# Patient Record
Sex: Female | Born: 1945
Health system: Southern US, Community
[De-identification: ages and names within clinical notes are randomized; demographics above are authoritative.]

## PROBLEM LIST (undated history)

## (undated) DIAGNOSIS — M1990 Unspecified osteoarthritis, unspecified site: Secondary | ICD-10-CM

## (undated) DIAGNOSIS — Z9989 Dependence on other enabling machines and devices: Secondary | ICD-10-CM

## (undated) DIAGNOSIS — R51 Headache: Secondary | ICD-10-CM

## (undated) DIAGNOSIS — M542 Cervicalgia: Secondary | ICD-10-CM

## (undated) DIAGNOSIS — R569 Unspecified convulsions: Secondary | ICD-10-CM

## (undated) DIAGNOSIS — G500 Trigeminal neuralgia: Secondary | ICD-10-CM

## (undated) DIAGNOSIS — K219 Gastro-esophageal reflux disease without esophagitis: Secondary | ICD-10-CM

## (undated) DIAGNOSIS — Z87442 Personal history of urinary calculi: Secondary | ICD-10-CM

## (undated) DIAGNOSIS — I10 Essential (primary) hypertension: Secondary | ICD-10-CM

## (undated) DIAGNOSIS — E785 Hyperlipidemia, unspecified: Secondary | ICD-10-CM

## (undated) DIAGNOSIS — G4733 Obstructive sleep apnea (adult) (pediatric): Secondary | ICD-10-CM

## (undated) DIAGNOSIS — I739 Peripheral vascular disease, unspecified: Secondary | ICD-10-CM

## (undated) DIAGNOSIS — M549 Dorsalgia, unspecified: Secondary | ICD-10-CM

## (undated) DIAGNOSIS — IMO0001 Reserved for inherently not codable concepts without codable children: Secondary | ICD-10-CM

## (undated) DIAGNOSIS — J449 Chronic obstructive pulmonary disease, unspecified: Secondary | ICD-10-CM

## (undated) DIAGNOSIS — I2699 Other pulmonary embolism without acute cor pulmonale: Secondary | ICD-10-CM

## (undated) DIAGNOSIS — I5031 Acute diastolic (congestive) heart failure: Secondary | ICD-10-CM

## (undated) DIAGNOSIS — R011 Cardiac murmur, unspecified: Secondary | ICD-10-CM

## (undated) DIAGNOSIS — G43019 Migraine without aura, intractable, without status migrainosus: Secondary | ICD-10-CM

## (undated) DIAGNOSIS — G8929 Other chronic pain: Secondary | ICD-10-CM

## (undated) DIAGNOSIS — L89154 Pressure ulcer of sacral region, stage 4: Secondary | ICD-10-CM

## (undated) HISTORY — DX: Gastro-esophageal reflux disease without esophagitis: K21.9

## (undated) HISTORY — DX: Unspecified convulsions: R56.9

## (undated) HISTORY — DX: Chronic obstructive pulmonary disease, unspecified: J44.9

## (undated) HISTORY — DX: Hyperlipidemia, unspecified: E78.5

## (undated) HISTORY — DX: Peripheral vascular disease, unspecified: I73.9

## (undated) HISTORY — PX: MULTIPLE TOOTH EXTRACTIONS: SHX2053

## (undated) HISTORY — DX: Unspecified osteoarthritis, unspecified site: M19.90

## (undated) HISTORY — DX: Headache: R51

## (undated) HISTORY — DX: Cervicalgia: M54.2

## (undated) HISTORY — PX: NO PAST SURGERIES: SHX2092

## (undated) HISTORY — DX: Migraine without aura, intractable, without status migrainosus: G43.019

---
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
## 2010-08-21 ENCOUNTER — Inpatient Hospital Stay (HOSPITAL_COMMUNITY)
Admission: EM | Admit: 2010-08-21 | Discharge: 2010-08-23 | Payer: Self-pay | Source: Home / Self Care | Attending: Cardiology | Admitting: Cardiology

## 2010-08-22 ENCOUNTER — Encounter (INDEPENDENT_AMBULATORY_CARE_PROVIDER_SITE_OTHER): Payer: Self-pay | Admitting: Internal Medicine

## 2010-08-23 ENCOUNTER — Encounter: Payer: Self-pay | Admitting: Cardiology

## 2010-09-01 ENCOUNTER — Ambulatory Visit
Admission: RE | Admit: 2010-09-01 | Discharge: 2010-09-01 | Payer: Self-pay | Source: Home / Self Care | Attending: Internal Medicine | Admitting: Internal Medicine

## 2010-09-01 DIAGNOSIS — Q120 Congenital cataract: Secondary | ICD-10-CM | POA: Insufficient documentation

## 2010-09-01 DIAGNOSIS — K219 Gastro-esophageal reflux disease without esophagitis: Secondary | ICD-10-CM | POA: Insufficient documentation

## 2010-09-01 DIAGNOSIS — E785 Hyperlipidemia, unspecified: Secondary | ICD-10-CM | POA: Insufficient documentation

## 2010-09-01 DIAGNOSIS — I2510 Atherosclerotic heart disease of native coronary artery without angina pectoris: Secondary | ICD-10-CM | POA: Insufficient documentation

## 2010-09-01 DIAGNOSIS — M1990 Unspecified osteoarthritis, unspecified site: Secondary | ICD-10-CM | POA: Insufficient documentation

## 2010-09-01 DIAGNOSIS — F17200 Nicotine dependence, unspecified, uncomplicated: Secondary | ICD-10-CM | POA: Insufficient documentation

## 2010-09-01 DIAGNOSIS — J441 Chronic obstructive pulmonary disease with (acute) exacerbation: Secondary | ICD-10-CM | POA: Insufficient documentation

## 2010-09-01 DIAGNOSIS — R51 Headache: Secondary | ICD-10-CM | POA: Insufficient documentation

## 2010-09-01 DIAGNOSIS — I739 Peripheral vascular disease, unspecified: Secondary | ICD-10-CM | POA: Insufficient documentation

## 2010-09-01 DIAGNOSIS — R519 Headache, unspecified: Secondary | ICD-10-CM | POA: Insufficient documentation

## 2010-09-03 ENCOUNTER — Encounter: Payer: Self-pay | Admitting: Internal Medicine

## 2010-09-04 ENCOUNTER — Encounter: Payer: Self-pay | Admitting: Internal Medicine

## 2010-09-11 ENCOUNTER — Ambulatory Visit (HOSPITAL_COMMUNITY)
Admission: RE | Admit: 2010-09-11 | Discharge: 2010-09-11 | Payer: Self-pay | Source: Home / Self Care | Attending: Internal Medicine | Admitting: Internal Medicine

## 2010-09-18 ENCOUNTER — Encounter: Payer: Self-pay | Admitting: Internal Medicine

## 2010-09-19 ENCOUNTER — Encounter (INDEPENDENT_AMBULATORY_CARE_PROVIDER_SITE_OTHER): Payer: Self-pay | Admitting: *Deleted

## 2010-09-21 ENCOUNTER — Encounter
Admission: RE | Admit: 2010-09-21 | Discharge: 2010-09-21 | Payer: Self-pay | Source: Home / Self Care | Attending: Internal Medicine | Admitting: Internal Medicine

## 2010-09-28 ENCOUNTER — Other Ambulatory Visit: Payer: Self-pay | Admitting: Internal Medicine

## 2010-09-28 DIAGNOSIS — R921 Mammographic calcification found on diagnostic imaging of breast: Secondary | ICD-10-CM

## 2010-09-28 DIAGNOSIS — R928 Other abnormal and inconclusive findings on diagnostic imaging of breast: Secondary | ICD-10-CM

## 2010-09-28 NOTE — Miscellaneous (Signed)
Summary: meds  Clinical Lists Changes  Medications: Added new medication of * ALBUTEROL INHALER - Signed Added new medication of * FLOVENT Twice daily - Signed Added new medication of * IBUPROFRN 600MG  three times a day - Signed Added new medication of * PRILOSEC 40MG  Take 1 tablet by mouth once a day - Signed Added new medication of * CRESTOR 20MG  Take 1 tablet by mouth once a day - Signed Added new medication of * SPIRIVA once daily - Signed

## 2010-09-28 NOTE — Medication Information (Signed)
Summary: [facility]  [facility]   Imported By: Sherian Rein 09/15/2010 14:59:54  _____________________________________________________________________  External Attachment:    Type:   Image     Comment:   External Document

## 2010-09-28 NOTE — Letter (Signed)
Summary: Pre Visit Letter Revised  [facility]  [address] [place], [place] [address]   Phone: [phone]  Fax: [phone]        09/19/2010 MRN: [id number] Janice Morrow [address] [place], [place]  [address]             Procedure Date:  10-20-10   Welcome to the Gastroenterology Division at [facility].    You are scheduled to see a nurse for your pre-procedure visit on 10-06-10 at 4:30P.M. on the 3rd floor at [facility], [address]  We ask that you try to arrive at our office 15 minutes prior to your appointment time to allow for check-in.  Please take a minute to review the attached form.  If you answer "Yes" to one or more of the questions on the first page, we ask that you call the person listed at your earliest opportunity.  If you answer "No" to all of the questions, please complete the rest of the form and bring it to your appointment.    Your nurse visit will consist of discussing your medical and surgical history, your immediate family medical history, and your medications.   If you are unable to list all of your medications on the form, please bring the medication bottles to your appointment and we will list them.  We will need to be aware of both prescribed and over the counter drugs.  We will need to know exact dosage information as well.    Please be prepared to read and sign documents such as consent forms, a financial agreement, and acknowledgement forms.  If necessary, and with your consent, a friend or relative is welcome to sit-in on the nurse visit with you.  Please bring your insurance card so that we may make a copy of it.  If your insurance requires a referral to see a specialist, please bring your referral form from your primary care physician.  No co-pay is required for this nurse visit.     If you cannot keep your appointment, please call [phone] to cancel or reschedule prior to your appointment date.  This allows [place] the  opportunity to schedule an appointment for another patient in need of care.    Thank you for choosing [facility] for your medical needs.  We appreciate the opportunity to care for you.  Please visit [place] at our website  to learn more about our practice.  Sincerely, The Gastroenterology Division

## 2010-09-28 NOTE — Assessment & Plan Note (Signed)
Summary: New / Medicare / #/ cd   Vital Signs:  Patient profile:   65 year old female Menstrual status:  postmenopausal Height:      65 inches Weight:      301 pounds BMI:     50.27 O2 Sat:      93 % on Room air Temp:     98.3 degrees F oral Pulse rate:   78 / minute Pulse rhythm:   regular Resp:     16 per minute BP sitting:   120 / 70  (left arm) Cuff size:   large  Vitals Entered By: Rock Nephew CMA (September 01, 2010 1:53 PM)  Nutrition Counseling: Patient's BMI is greater than 25 and therefore counseled on weight management options.  O2 Flow:  Room air CC: new to establish, Headaches, Preventive Care Is Patient Diabetic? No  Does patient need assistance? Functional Status Self care Ambulation Normal     Menstrual Status postmenopausal   Primary Care Provider:  Etta Grandchild MD  CC:  new to establish, Headaches, and Preventive Care.  History of Present Illness:  Headaches      This is a 65 year old woman who presents with Headaches.  The symptoms began 3 years ago.  On a scale of 1 to 10, the intensity is described as a 2.  The patient reports nausea and vomiting, but denies sweats, tearing of eyes, nasal congestion, sinus pain, sinus pressure, photophobia, and phonophobia.  The headache is described as stabbing and sharp.  The location of the pain is unilateral on the right.  High-risk features (red flags) include pain worse with exertion and age >50 years.  The patient denies the following high-risk features: fever, neck pain/stiffness, vision loss or change, focal weakness, altered mental status, rash, trauma, new type of headache, immunosuppression, concomitant infection, and anticoagulation use.  The headaches are precipitated by change in weather.  Prior treatment has included a narcotic- she tells that a doctor in [place] gave her percocet at one time. She tells me that she has never had a brain imaging done and she is concerned that she may have a tumor or  cyst.  She is s/p an admission for COPD exacerbation and ACS ( Dr. Jens Som .)  Preventive Screening-Counseling & Management  Alcohol-Tobacco     Alcohol drinks/day: 0     Alcohol Counseling: not indicated; patient does not drink     Smoking Status: quit < 6 months     Smoking Cessation Counseling: yes     Smoke Cessation Stage: quit     Year Quit: 2012     Tobacco Counseling: to remain off tobacco products  Caffeine-Diet-Exercise     Does Patient Exercise: no  Hep-HIV-STD-Contraception     Hepatitis Risk: no risk noted     HIV Risk: no risk noted     STD Risk: no risk noted     Dental Visit-last 6 months yes     SBE monthly: yes     SBE Education/Counseling: to perform regular SBE  Safety-Violence-Falls     Seat Belt Use: yes     Helmet Use: n/a     Firearms in the Home: no firearms in the home     Smoke Detectors: no     Violence in the Home: no risk noted     Sexual Abuse: no      Drug Use:  no.        Blood Transfusions:  no.  Medications Prior to Update: 1)  None  Current Medications (verified): 1)  Tramadol Hcl 50 Mg Tabs (Tramadol Hcl) .... One By Mouth Qid As Needed For Pain  Allergies (verified): No Known Drug Allergies  Past History:  Past Medical History: COPD Coronary artery disease GERD Hyperlipidemia Osteoarthritis Peripheral vascular disease  Past Surgical History: Denies surgical history  Family History: Family History of CAD Female 1st degree relative <50  Social History: Retired/disabled Alcohol use-no Drug use-no Regular exercise-no Smoking Status:  quit < 6 months Hepatitis Risk:  no risk noted HIV Risk:  no risk noted STD Risk:  no risk noted Blood Transfusions:  no Drug Use:  no Does Patient Exercise:  no Seat Belt Use:  yes Dental Care w/in 6 mos.:  yes  Review of Systems  The patient denies anorexia, fever, weight loss, weight gain, chest pain, syncope, dyspnea on exertion, peripheral edema, prolonged cough,  headaches, hemoptysis, abdominal pain, hematuria, depression, abnormal bleeding, enlarged lymph nodes, angioedema, and breast masses.   Eyes:  Denies blurring, discharge, double vision, eye irritation, eye pain, halos, itching, light sensitivity, red eye, vision loss-1 eye, and vision loss-both eyes. CV:  Denies chest pain or discomfort, difficulty breathing at night, fainting, fatigue, leg cramps with exertion, lightheadness, near fainting, palpitations, shortness of breath with exertion, and swelling of feet. Resp:  Denies chest pain with inspiration, cough, coughing up blood, excessive snoring, pleuritic, shortness of breath, sputum productive, and wheezing.  Physical Exam  General:  alert, well-developed, well-nourished, well-hydrated, appropriate dress, normal appearance, healthy-appearing, cooperative to examination, good hygiene, and overweight-appearing.   Head:  normocephalic, atraumatic, no abnormalities observed, and no abnormalities palpated.  she has coarse facial hair over the chin. Eyes:  vision grossly intact, pupils equal, pupils round, pupils reactive to light, pupils react to accomodation, no injection, no optic disk abnormalities, and left cataract.   Ears:  R ear normal and L ear normal.   Nose:  External nasal examination shows no deformity or inflammation. Nasal mucosa are pink and moist without lesions or exudates. Mouth:  Oral mucosa and oropharynx without lesions or exudates.  Teeth in good repair. Neck:  supple, full ROM, no masses, no thyromegaly, no thyroid nodules or tenderness, no JVD, normal carotid upstroke, no carotid bruits, no cervical lymphadenopathy, and no neck tenderness.   Lungs:  normal respiratory effort, no intercostal retractions, no accessory muscle use, normal breath sounds, no dullness, no fremitus, no crackles, and no wheezes.   Heart:  normal rate, regular rhythm, no murmur, no gallop, no rub, and no JVD.   Abdomen:  soft, non-tender, normal bowel  sounds, no distention, no masses, no guarding, no rigidity, no rebound tenderness, no abdominal hernia, no inguinal hernia, no hepatomegaly, and no splenomegaly.   Msk:  severe DJD in both knees Pulses:  R radial normal, R femoral normal, L radial normal, L femoral normal, R popliteal decreased, R posterior tibial decreased, R dorsalis pedis decreased, L popliteal decreased, L posterior tibial decreased, and L dorsalis pedis decreased.   Extremities:  No clubbing, cyanosis, edema, or deformity noted with normal full range of motion of all joints.   Neurologic:  No cranial nerve deficits noted. Station and gait are normal. Plantar reflexes are down-going bilaterally. DTRs are symmetrical throughout. Sensory, motor and coordinative functions appear intact. Skin:  turgor normal, color normal, no rashes, no suspicious lesions, no ecchymoses, no petechiae, no purpura, no ulcerations, and no edema.   Cervical Nodes:  no anterior cervical adenopathy and no  posterior cervical adenopathy.   Axillary Nodes:  no R axillary adenopathy and no L axillary adenopathy.   Inguinal Nodes:  no R inguinal adenopathy and no L inguinal adenopathy.   Psych:  Cognition and judgment appear intact. Alert and cooperative with normal attention span and concentration. No apparent delusions, illusions, hallucinations   Impression & Recommendations:  Problem # 1:  TOBACCO USE (ICD-305.1) Assessment Improved  Encouraged smoking cessation and discussed different methods for smoking cessation.   Problem # 2:  HEADACHE (ICD-784.0) Assessment: New will screen her for mass, avm, etc. and treat the pain with tramadol Her updated medication list for this problem includes:    Tramadol Hcl 50 Mg Tabs (Tramadol hcl) ..... One by mouth qid as needed for pain  Orders: Radiology Referral (Radiology)  Headache diary reviewed.  Problem # 3:  CATARACT, CONGENITAL, LEFT (ICD-743.30) Assessment: New  Orders: Ophthalmology Referral  (Ophthalmology)  Problem # 4:  PERIPHERAL VASCULAR DISEASE (ICD-443.9) Assessment: Unchanged  Complete Medication List: 1)  Tramadol Hcl 50 Mg Tabs (Tramadol hcl) .... One by mouth qid as needed for pain  Other Orders: Gastroenterology Referral (GI)  Colorectal Screening:  Current Recommendations:    Colonoscopy recommended: scheduled with G.I.  PAP Screening:    Reviewed PAP smear recommendations:  patient refuses understanding risks of delayed diagnosis  Mammogram Screening:    Reviewed Mammogram recommendations:  mammogram ordered  Osteoporosis Risk Assessment:  Risk Factors for Fracture or Low Bone Density:   Smoking status:       quit < 6 months  Immunization & Chemoprophylaxis:    Pneumovax: Pneumovax  (08/16/2010)  Patient Instructions: 1)  Please schedule a follow-up appointment in 1 month. 2)  Tobacco is very bad for your health and your loved ones! You Should stop smoking!. 3)  Stop Smoking Tips: Choose a Quit date. Cut down before the Quit date. decide what you will do as a substitute when you feel the urge to smoke(gum,toothpick,exercise). 4)  It is important that you exercise regularly at least 20 minutes 5 times a week. If you develop chest pain, have severe difficulty breathing, or feel very tired , stop exercising immediately and seek medical attention. 5)  You need to lose weight. Consider a lower calorie diet and regular exercise.  6)  Schedule your mammogram. 7)  Schedule a colonoscopy/sigmoidoscopy to help detect colon cancer. 8)  You need to have a Pap Smear to prevent cervical cancer. Prescriptions: TRAMADOL HCL 50 MG TABS (TRAMADOL HCL) One by mouth QID as needed for pain  #75 x 3   Entered and Authorized by:   Etta Grandchild MD   Signed by:   Etta Grandchild MD on 09/01/2010   Method used:   Print then Give to Patient   RxID:   [id number]    Orders Added: 1)  Ophthalmology Referral [Ophthalmology] 2)  Radiology Referral [Radiology] 3)   Radiology Referral [Radiology] 4)  New Patient Level IV [14782] 5)  Gastroenterology Referral [GI]   Immunization History:  Pneumovax Immunization History:    Pneumovax:  pneumovax (08/16/2010)   Immunization History:  Pneumovax Immunization History:    Pneumovax:  Pneumovax (08/16/2010)    Prevention & Chronic Care Immunizations   Influenza vaccine: Not documented   Influenza vaccine deferral: Refused  (09/01/2010)    Tetanus booster: Not documented    Pneumococcal vaccine: Pneumovax  (08/16/2010)    H. zoster vaccine: Not documented   H. zoster vaccine deferral: Refused  (09/01/2010)  Colorectal Screening  Hemoccult: Not documented    Colonoscopy: Not documented   Colonoscopy action/deferral: scheduled with G.I.  (09/01/2010)  Other Screening   Pap smear: Not documented   Pap smear action/deferral: patient refuses understanding risks of delayed diagnosis  (09/01/2010)    Mammogram: Not documented   Mammogram action/deferral: mammogram ordered  (09/01/2010)    DXA bone density scan: Not documented   Smoking status: quit < 6 months  (09/01/2010)  Lipids   Total Cholesterol: Not documented   LDL: Not documented   LDL Direct: Not documented   HDL: Not documented   Triglycerides: Not documented    SGOT (AST): Not documented   SGPT (ALT): Not documented   Alkaline phosphatase: Not documented   Total bilirubin: Not documented  Self-Management Support :    Lipid self-management support: Not documented    Nursing Instructions: Give tetanus booster today

## 2010-09-29 ENCOUNTER — Encounter: Payer: Self-pay | Admitting: Internal Medicine

## 2010-10-02 ENCOUNTER — Ambulatory Visit: Payer: Self-pay | Admitting: Internal Medicine

## 2010-10-02 ENCOUNTER — Ambulatory Visit (INDEPENDENT_AMBULATORY_CARE_PROVIDER_SITE_OTHER): Payer: Medicare Other | Admitting: Internal Medicine

## 2010-10-02 ENCOUNTER — Encounter: Payer: Self-pay | Admitting: Internal Medicine

## 2010-10-02 DIAGNOSIS — J449 Chronic obstructive pulmonary disease, unspecified: Secondary | ICD-10-CM

## 2010-10-02 DIAGNOSIS — R51 Headache: Secondary | ICD-10-CM

## 2010-10-04 ENCOUNTER — Ambulatory Visit
Admission: RE | Admit: 2010-10-04 | Discharge: 2010-10-04 | Disposition: A | Discharge status: Home / Self Care | Payer: Medicare Other | Source: Ambulatory Visit | Attending: Internal Medicine | Admitting: Internal Medicine

## 2010-10-04 DIAGNOSIS — R921 Mammographic calcification found on diagnostic imaging of breast: Secondary | ICD-10-CM

## 2010-10-04 DIAGNOSIS — R928 Other abnormal and inconclusive findings on diagnostic imaging of breast: Secondary | ICD-10-CM

## 2010-10-05 ENCOUNTER — Encounter (INDEPENDENT_AMBULATORY_CARE_PROVIDER_SITE_OTHER): Payer: Self-pay | Admitting: *Deleted

## 2010-10-06 ENCOUNTER — Encounter: Payer: Self-pay | Admitting: Gastroenterology

## 2010-10-12 NOTE — Letter (Signed)
Summary: [facility] Instructions  [facility]  [address] [place], [place] [address]   Phone: [phone]  Fax: [phone]       Janice Morrow    14-Feb-1946    MRN: [id number]        Procedure Day /Date: Friday 10/20/10     Arrival Time: 10:30 am      Procedure Time: 11:30 am     Location of Procedure:                    _X_  [facility] (4th Floor)  PREPARATION FOR COLONOSCOPY WITH MOVIPREP   Starting 5 days prior to your procedure 10/15/10  do not eat nuts, seeds, popcorn, corn, beans, peas,  salads, or any raw vegetables.  Do not take any fiber supplements (e.g. Metamucil, Citrucel, and Benefiber).  THE DAY BEFORE YOUR PROCEDURE         DATE: 10/19/10    DAY: Thursday    1.  Drink clear liquids the entire day-NO SOLID FOOD  2.  Do not drink anything colored red or purple.  Avoid juices with pulp.  No orange juice.  3.  Drink at least 64 oz. (8 glasses) of fluid/clear liquids during the day to prevent dehydration and help the prep work efficiently.  CLEAR LIQUIDS INCLUDE: Water Jello Ice Popsicles Tea (sugar ok, no milk/cream) Powdered fruit flavored drinks Coffee (sugar ok, no milk/cream) Gatorade Juice: apple, white grape, white cranberry  Lemonade Clear bullion, consomm, broth Carbonated beverages (any kind) Strained chicken noodle soup Hard Candy                             4.  In the morning, mix first dose of MoviPrep solution:    Empty 1 Pouch A and 1 Pouch B into the disposable container    Add lukewarm drinking water to the top line of the container. Mix to dissolve    Refrigerate (mixed solution should be used within 24 hrs)  5.  Begin drinking the prep at 5:00 p.m. The MoviPrep container is divided by 4 marks.   Every 15 minutes drink the solution down to the next mark (approximately 8 oz) until the full liter is complete.   6.  Follow completed prep with 16 oz of clear liquid of your choice (Nothing red or purple).   Continue to drink clear liquids until bedtime.  7.  Before going to bed, mix second dose of MoviPrep solution:    Empty 1 Pouch A and 1 Pouch B into the disposable container    Add lukewarm drinking water to the top line of the container. Mix to dissolve    Refrigerate  THE DAY OF YOUR PROCEDURE      DATE: 10/20/10  DAY: Friday  Beginning at 6:30 a.m. (5 hours before procedure):         1. Every 15 minutes, drink the solution down to the next mark (approx 8 oz) until the full liter is complete.  2. Follow completed prep with 16 oz. of clear liquid of your choice.    3. You may drink clear liquids until 9:30 am  (2 HOURS BEFORE PROCEDURE).   MEDICATION INSTRUCTIONS  Unless otherwise instructed, you should take regular prescription medications with a small sip of water   as early as possible the morning of your procedure.            OTHER INSTRUCTIONS  You will need a responsible adult at least 63 years of age to accompany you and drive you home.   This person must remain in the waiting room during your procedure.  Wear loose fitting clothing that is easily removed.  Leave jewelry and other valuables at home.  However, you may wish to bring a book to read or  an iPod/MP3 player to listen to music as you wait for your procedure to start.  Remove all body piercing jewelry and leave at home.  Total time from sign-in until discharge is approximately 2-3 hours.  You should go home directly after your procedure and rest.  You can resume normal activities the  day after your procedure.  The day of your procedure you should not:   Drive   Make legal decisions   Operate machinery   Drink alcohol   Return to work  You will receive specific instructions about eating, activities and medications before you leave.    The above instructions have been reviewed and explained to me by   Sherren Kerns RN  October 06, 2010 4:44 PM    I fully understand and can verbalize  these instructions _____________________________ Date _________

## 2010-10-12 NOTE — Letter (Signed)
Summary: [facility] Consult Scheduled Letter  [facility]  [address] [place], [place] [address]   Phone: [phone]  Fax: [phone]      10/05/2010 MRN: [id number]  SARAHBETH CASHIN [address] [place], [place]  [address]  [place]    Dear Ms. Santoro,      We have scheduled an appointment for you.  At the recommendation of Dr.Jones, we have scheduled you a consult with Dr Marchelle Gearing on 10/18/10 at 3:50pm.  Their phone number is [phone].  If this appointment day and time is not convenient for you, please feel free to call the office of the doctor you are being referred to at the number listed above and reschedule the appointment.     [facility] [address] [place], [place] [address] *Pulmonary Dept.2nd Floor*   Please give 24hr notice if you need to cancel/rescheule to avoid a $50.00 fee.Also bring insurance card and any co-pay due at time of visit.    Thank you,  Patient Care Coordinator  [facility]

## 2010-10-12 NOTE — Assessment & Plan Note (Signed)
Summary: 1 MTH FU-LB   Vital Signs:  Patient profile:   65 year old female Menstrual status:  postmenopausal Height:      65 inches Weight:      306 pounds BMI:     51.11 O2 Sat:      94 % on Room air Temp:     98.6 degrees F oral Pulse rate:   74 / minute Pulse rhythm:   regular Resp:     16 per minute BP sitting:   130 / 82  (left arm) Cuff size:   large  Vitals Entered By: Rock Nephew CMA (October 02, 2010 2:16 PM)  O2 Flow:  Room air CC: follow-up visit Is Patient Diabetic? No  Does patient need assistance? Functional Status Self care Ambulation Normal   Primary Care Provider:  Etta Grandchild MD  CC:  follow-up visit.  History of Present Illness: She returns for f/up and complains of persistent headaches and she wants to see a specialist to find out what causes her pain. She tells me that tramadol kills the pain but it makes her sleepy so she has stopped taking it.  Also, she has stopped using all of her inhalers b/c she has decided that she will not take steroids anymore.  Preventive Screening-Counseling & Management  Alcohol-Tobacco     Alcohol drinks/day: 0     Alcohol Counseling: not indicated; patient does not drink     Smoking Status: quit < 6 months     Smoking Cessation Counseling: yes     Smoke Cessation Stage: quit     Year Quit: 2012     Tobacco Counseling: to remain off tobacco products  Hep-HIV-STD-Contraception     Hepatitis Risk: no risk noted     HIV Risk: no risk noted     STD Risk: no risk noted     Dental Visit-last 6 months yes     SBE monthly: yes     SBE Education/Counseling: to perform regular SBE      Drug Use:  no.        Blood Transfusions:  no.    Clinical Review Panels:  Prevention   Last Mammogram:  mammograms for comparison - BI-RADS 0 -^MM DIGITAL SCREENING (09/21/2010)  Immunizations   Last Pneumovax:  Pneumovax (08/16/2010)  Diabetes Management   Last Pneumovax:  Pneumovax (08/16/2010)   Medications  Prior to Update: 1)  Tramadol Hcl 50 Mg Tabs (Tramadol Hcl) .... One By Mouth Qid As Needed For Pain 2)  Albuterol Inhaler 3)  Flovent .... Twice Daily 4)  Ibuprofrn 600mg  .... Three Times A Day 5)  Prilosec 40mg  .... Take 1 Tablet By Mouth Once A Day 6)  Crestor 20mg  .... Take 1 Tablet By Mouth Once A Day 7)  Spiriva .... Once Daily  Current Medications (verified): 1)  Ibuprofrn 600mg  .... Three Times A Day 2)  Prilosec 40mg  .... Take 1 Tablet By Mouth Once A Day 3)  Crestor 20mg  .... Take 1 Tablet By Mouth Once A Day  Allergies (verified): No Known Drug Allergies  Past History:  Past Medical History: Last updated: 09/01/2010 COPD Coronary artery disease GERD Hyperlipidemia Osteoarthritis Peripheral vascular disease  Past Surgical History: Last updated: 09/01/2010 Denies surgical history  Family History: Last updated: 09/01/2010 Family History of CAD Female 1st degree relative <50  Social History: Last updated: 09/01/2010 Retired/disabled Alcohol use-no Drug use-no Regular exercise-no  Risk Factors: Alcohol Use: 0 (10/02/2010) Exercise: no (09/01/2010)  Risk Factors:  Smoking Status: quit < 6 months (10/02/2010)  Family History: Reviewed history from 09/01/2010 and no changes required. Family History of CAD Female 1st degree relative <50  Social History: Reviewed history from 09/01/2010 and no changes required. Retired/disabled Alcohol use-no Drug use-no Regular exercise-no  Review of Systems       The patient complains of weight gain and headaches.  The patient denies anorexia, fever, weight loss, chest pain, syncope, dyspnea on exertion, peripheral edema, prolonged cough, hemoptysis, abdominal pain, melena, hematuria, suspicious skin lesions, difficulty walking, depression, unusual weight change, abnormal bleeding, and enlarged lymph nodes.   Resp:  Complains of cough and wheezing; denies chest discomfort, chest pain with inspiration, coughing up  blood, excessive snoring, hypersomnolence, morning headaches, pleuritic, shortness of breath, and sputum productive.  Physical Exam  General:  alert, well-developed, well-nourished, well-hydrated, appropriate dress, normal appearance, healthy-appearing, cooperative to examination, good hygiene, and overweight-appearing.   Head:  normocephalic, atraumatic, no abnormalities observed, and no abnormalities palpated.  she has coarse facial hair over the chin. Mouth:  Oral mucosa and oropharynx without lesions or exudates.  Teeth in good repair. Neck:  supple, full ROM, no masses, no thyromegaly, no thyroid nodules or tenderness, no JVD, normal carotid upstroke, no carotid bruits, no cervical lymphadenopathy, and no neck tenderness.   Lungs:  normal respiratory effort, no intercostal retractions, no accessory muscle use, normal breath sounds, no dullness, no fremitus, no crackles, and no wheezes.   Heart:  normal rate, regular rhythm, no murmur, no gallop, no rub, and no JVD.   Abdomen:  soft, non-tender, normal bowel sounds, no distention, no masses, no guarding, no rigidity, no rebound tenderness, no abdominal hernia, no inguinal hernia, no hepatomegaly, and no splenomegaly.   Msk:  severe DJD in both knees Pulses:  R radial normal, R femoral normal, L radial normal, L femoral normal, R popliteal decreased, R posterior tibial decreased, R dorsalis pedis decreased, L popliteal decreased, L posterior tibial decreased, and L dorsalis pedis decreased.   Extremities:  No clubbing, cyanosis, edema, or deformity noted with normal full range of motion of all joints.   Neurologic:  No cranial nerve deficits noted. Station and gait are normal. Plantar reflexes are down-going bilaterally. DTRs are symmetrical throughout. Sensory, motor and coordinative functions appear intact. Skin:  turgor normal, color normal, no rashes, no suspicious lesions, no ecchymoses, no petechiae, no purpura, no ulcerations, and no edema.    Psych:  Cognition and judgment appear intact. Alert and cooperative with normal attention span and concentration. No apparent delusions, illusions, hallucinations   Impression & Recommendations:  Problem # 1:  COPD (ICD-496) Assessment Unchanged  Orders: Pulmonary Referral (Pulmonary)  Pulmonary Functions Reviewed: O2 sat: 94 (10/02/2010)     Vaccines Reviewed: Pneumovax: Pneumovax (08/16/2010)     Problem # 2:  HEADACHE (ICD-784.0) Assessment: Unchanged  The following medications were removed from the medication list:    Tramadol Hcl 50 Mg Tabs (Tramadol hcl) ..... One by mouth qid as needed for pain  Orders: Neurology Referral (Neuro)  Headache diary reviewed.  Complete Medication List: 1)  Ibuprofrn 600mg   .... Three times a day 2)  Prilosec 40mg   .... Take 1 tablet by mouth once a day 3)  Crestor 20mg   .... Take 1 tablet by mouth once a day  Patient Instructions: 1)  Please schedule a follow-up appointment in 3 months. 2)  It is important that you exercise regularly at least 20 minutes 5 times a week. If you develop chest pain,  have severe difficulty breathing, or feel very tired , stop exercising immediately and seek medical attention. 3)  You need to lose weight. Consider a lower calorie diet and regular exercise.    Orders Added: 1)  Neurology Referral [Neuro] 2)  Pulmonary Referral [Pulmonary] 3)  Est. Patient Level III [36644]

## 2010-10-12 NOTE — Miscellaneous (Signed)
Summary: previsit prep/rm  Clinical Lists Changes Patient unable to pay for moviprep, so spoke with pharmacy about halflytlely prep. they said this would cost $3.30. PATIENT agreed/able to pay for this. Medications: Added new medication of HALFLYTELY WITH FLAVOR PACKS 5-210 MG-GM KIT (BISACODYL-PEG-KCL-NABICAR-NACL)

## 2010-10-12 NOTE — Consult Note (Signed)
Summary: [facility]   Imported By: Sherian Rein 10/03/2010 08:18:11  _____________________________________________________________________  External Attachment:    Type:   Image     Comment:   External Document

## 2010-10-16 ENCOUNTER — Encounter (INDEPENDENT_AMBULATORY_CARE_PROVIDER_SITE_OTHER): Payer: Self-pay | Admitting: *Deleted

## 2010-10-18 ENCOUNTER — Institutional Professional Consult (permissible substitution) (INDEPENDENT_AMBULATORY_CARE_PROVIDER_SITE_OTHER): Payer: Medicare Other | Admitting: Internal Medicine

## 2010-10-18 ENCOUNTER — Encounter: Payer: Self-pay | Admitting: Internal Medicine

## 2010-10-18 DIAGNOSIS — J449 Chronic obstructive pulmonary disease, unspecified: Secondary | ICD-10-CM

## 2010-10-20 ENCOUNTER — Other Ambulatory Visit: Payer: Self-pay | Admitting: Gastroenterology

## 2010-10-20 ENCOUNTER — Other Ambulatory Visit (AMBULATORY_SURGERY_CENTER): Payer: Medicare Other | Admitting: Gastroenterology

## 2010-10-20 DIAGNOSIS — D126 Benign neoplasm of colon, unspecified: Secondary | ICD-10-CM

## 2010-10-20 DIAGNOSIS — Z1211 Encounter for screening for malignant neoplasm of colon: Secondary | ICD-10-CM

## 2010-10-20 DIAGNOSIS — K5730 Diverticulosis of large intestine without perforation or abscess without bleeding: Secondary | ICD-10-CM

## 2010-10-20 LAB — HM COLONOSCOPY

## 2010-10-24 ENCOUNTER — Encounter: Payer: Self-pay | Admitting: Gastroenterology

## 2010-10-24 NOTE — Assessment & Plan Note (Signed)
Summary: COPD//SH   Visit Type:  Follow-up Primary Provider/Referring Provider:  Etta Grandchild MD  CC:  Pulmonary COnsult for SOB.  Pt states she wants oxygen picked up from home and she refused walk test. .  History of Present Illness: October 18, 2010: 65 year old obese female and ex heavy smoker (quit jan 2012, 40 pack smoker). States that in dec 2011 and atypical chest pain. PE ruled out. CT 08/23/2010 showed emphysema. MI ruled out. EKG suggested acute pericarditis but echo did not show perdicardial effusion. She ws discharged with o2 and spiriva and pulmicort per hx. She states that since discharge she has not used those medications. She feels she does not need mdi's. She does not have dyspnea but then she is sedentary at home other than groceries once a month and some cleaning of home.  She does not get dyspneic anymore for tehse activitieis. She used to have chronic cough before but since quitting smoking denies cough. Currently denies wheeze, chest pain, edema, snoring, or excess day time somnolence. She categorically feels o2 and mdi's are not helping her although she did desaturates to 87% after walking 155 feet. She is currently on no medications. I showed her her spirometry results showing FVC and fev1 in 50s with small airway but she does not want Rx. I offered copd research trial but not interested. She believer her weight is more of the issue for dyspnea.    Preventive Screening-Counseling & Management  Alcohol-Tobacco     Alcohol drinks/day: 0     Alcohol Counseling: not indicated; patient does not drink     Smoking Status: quit < 6 months     Smoking Cessation Counseling: yes     Smoke Cessation Stage: quit     Year Quit: 2012     Tobacco Counseling: to remain off tobacco products  Caffeine-Diet-Exercise     Does Patient Exercise: no  Current Medications (verified): 1)  None  Allergies (verified): No Known Drug Allergies  Past History:  Past medical, surgical,  family and social histories (including risk factors) reviewed, and no changes noted (except as noted below).  Past Medical History: Reviewed history from 09/01/2010 and no changes required. COPD Coronary artery disease GERD Hyperlipidemia Osteoarthritis Peripheral vascular disease  Past Surgical History: Reviewed history from 09/01/2010 and no changes required. Denies surgical history  Family History: Reviewed history from 09/01/2010 and no changes required. Family History of CAD Female 1st degree relative <50  Social History: Reviewed history from 09/01/2010 and no changes required. Lives with Sister  Retired/disabled Alcohol use-no Drug use-no Regular exercise-no  Quit in 09-22-10, started 1969 x 0.5ppd  Review of Systems       The patient complains of shortness of breath with activity, productive cough, chest pain, and acid heartburn.  The patient denies shortness of breath at rest, non-productive cough, coughing up blood, irregular heartbeats, indigestion, loss of appetite, weight change, abdominal pain, difficulty swallowing, sore throat, tooth/dental problems, headaches, nasal congestion/difficulty breathing through nose, sneezing, itching, ear ache, anxiety, depression, hand/feet swelling, joint stiffness or pain, rash, change in color of mucus, and fever.    Vital Signs:  Patient profile:   65 year old female Menstrual status:  postmenopausal Height:      65 inches Weight:      306.13 pounds BMI:     51.13 O2 Sat:      92 % on Room air Temp:     98.6 degrees F oral Pulse rate:   107 /  minute BP sitting:   124 / 76  (right arm) Cuff size:   large  Vitals Entered By: Carron Curie CMA (October 18, 2010 3:55 PM)  O2 Flow:  Room air  Serial Vital Signs/Assessments:  Comments: Ambulatory Pulse Oximetry  Resting; HR__97___    02 Sat__94___  Lap1 (185 feet)   HR_100____   02 Sat__87___ Lap2 (185 feet)   HR_____   02 Sat_____    Lap3 (185 feet)   HR_____    02 Sat_____  ___Test Completed without Difficulty __x_Test Stopped due to: pt desat   By: Carron Curie CMA   CC: Pulmonary COnsult for SOB.  Pt states she wants oxygen picked up from home and she refused walk test.  Comments Medications reviewed with patient Carron Curie CMA  October 18, 2010 3:57 PM Daytime phone number verified with patient.    Physical Exam  General:  well developed, well nourished, in no acute distressobese.   Head:  normocephalic and atraumatic Eyes:  PERRLA/EOM intact; conjunctiva and sclera clear Ears:  TMs intact and clear with normal canals Nose:  no deformity, discharge, inflammation, or lesions Mouth:  no deformity or lesionsMelampatti Class III.   Neck:  no masses, thyromegaly, or abnormal cervical nodes Chest Wall:  no deformities noted Lungs:  clear bilaterally to auscultation and percussion Heart:  regular rate and rhythm, S1, S2 without murmurs, rubs, gallops, or clicks Abdomen:  bowel sounds positive; abdomen soft and non-tender without masses, or organomegaly Msk:  no deformity or scoliosis noted with normal posture Pulses:  pulses normal Extremities:  no clubbing, cyanosis, edema, or deformity noted Neurologic:  CN II-XII grossly intact with normal reflexes, coordination, muscle strength and tone Skin:  intact without lesions or rashes Cervical Nodes:  no significant adenopathy Axillary Nodes:  no significant adenopathy Psych:  alert and cooperative; normal mood and affect; normal attention span and concentration   MISC. Report  Procedure date:  10/18/2010  Findings:      se results in hpi  Comments:      all independently reviwed  Impression & Recommendations:  Problem # 1:  COPD (ICD-496) Assessment New extensively counselled. I strngly advised o2 and mdi's but she refused fully knowing the implicaitons. will dc both o2 and mdi and monitor clinically. encouraged to lose weight. She thinks she cand do it iwhtout  help. ROV 9 months  Other Orders: DME Referral (DME) Consultation Level IV ([address])  Patient Instructions: 1)  dc oxygen 2)  dc inhalers 3)   you are doing this at your own risk 4)  focus on weight loss  5)  return to see me in 9 months

## 2010-10-24 NOTE — Letter (Signed)
Summary: [facility] Consult Scheduled Letter  [facility]  [address] [place], [place] [address]   Phone: [phone]  Fax: [phone]      10/16/2010 MRN: [id number]  Janice Morrow [address] [place], [place]  [address]  [place]    Dear Ms. Schoch,      We have scheduled an appointment for you.  At the recommendation of Dr Yetta Barre, we have scheduled you a consult with Criss Alvine on 10/27/10 at 8:30am.  Their phone number is [phone].  If this appointment day and time is not convenient for you, please feel free to call the office of the doctor you are being referred to at the number listed above and reschedule the appointment.    [facility] [address] [place], [place] [address]   Please arrive 30 minutes prior to appointment time.   Thank you,  Patient Care Coordinator [facility]

## 2010-10-24 NOTE — Procedures (Addendum)
Summary: Colonoscopy  Patient: Janice Morrow Note: All result statuses are Final unless otherwise noted.  Tests: (1) Colonoscopy (COL)   COL Colonoscopy           DONE     [facility]     [address]     [place], [place]  [address]           COLONOSCOPY PROCEDURE REPORT           PATIENT:  Janice Morrow  MR#:  [id number]     BIRTHDATE:  [birth year], 64 yrs. old  GENDER:  female           ENDOSCOPIST:  Barbette Hair. Arlyce Dice, MD     Referred by:  Etta Grandchild, M.D.           PROCEDURE DATE:  10/20/2010     PROCEDURE:  Colonoscopy with snare polypectomy     ASA CLASS:  Class II     INDICATIONS:  1) Routine Risk Screening           MEDICATIONS:   Fentanyl 75 mcg IV, Versed 7 mg IV, Benadryl 12.5     mg IV           DESCRIPTION OF PROCEDURE:   After the risks benefits and     alternatives of the procedure were thoroughly explained, informed     consent was obtained.  Digital rectal exam was performed and     revealed no abnormalities.   The [id number] endoscope was     introduced through the anus and advanced to the cecum, which was     identified by both the appendix and ileocecal valve, without     limitations.  The quality of the prep was good, using MoviPrep.     The instrument was then slowly withdrawn as the colon was fully     examined.     <<PROCEDUREIMAGES>>           FINDINGS:  A sessile polyp was found in the sigmoid colon. It was     6 mm in size. It was found 28 cm from the point of entry. Polyp     was snared without cautery. Retrieval was successful (see     image12). snare polyp  A lipoma was found at the hepatic flexure     (see image5). 3cm lipoma  Mild diverticulosis was found in the     sigmoid to descending colon segments (see image11).  This was     otherwise a normal examination of the colon (see image6, image7,     image8, image10, and image15).   Retroflexed views in the rectum     revealed no abnormalities.    The time to cecum =  4.0   minutes.     The scope was then withdrawn (time =  12.50  min) from the patient     and the procedure completed.           COMPLICATIONS:  None           ENDOSCOPIC IMPRESSION:     1) 6 mm sessile polyp in the sigmoid colon     2) Lipoma at the hepatic flexure     3) Mild diverticulosis in the sigmoid to descending colon     segments     4) Otherwise normal examination     RECOMMENDATIONS:     1) Sedation with MAC for future procedures     2) If  the polyp(s) removed today are proven to be adenomatous     (pre-cancerous) polyps, you will need a repeat colonoscopy in 5     years. Otherwise you should continue to follow colorectal cancer     screening guidelines for "routine risk" patients with colonoscopy     in 10 years.           REPEAT EXAM:   You will receive a letter from Dr. Arlyce Dice in 1-2     weeks, after reviewing the final pathology, with followup     recommendations.           ______________________________     Barbette Hair Arlyce Dice, MD           CC:           n.     eSIGNED:   Barbette Hair. Kaplan at 10/20/2010 12:39 PM           Janice Morrow, [id number]  Note: An exclamation mark (!) indicates a result that was not dispersed into the flowsheet. Document Creation Date: 10/20/2010 12:40 PM _______________________________________________________________________  (1) Order result status: Final Collection or observation date-time: 10/20/2010 12:33 Requested date-time:  Receipt date-time:  Reported date-time:  Referring Physician:   Ordering Physician: Melvia Heaps [phone]) Specimen Source:  Source: Janice Morrow Order Number: [id number] Lab site:   Appended Document: Colonoscopy     Procedures Next Due Date:    Colonoscopy: 09/2015

## 2010-11-02 NOTE — Letter (Signed)
Summary: Patient Notice- Polyp Results   [facility]  [address] [place], [place] [address]   Phone: [phone]  Fax: [phone]        October 24, 2010 MRN: [id number]    Janice Morrow [address] [place], [place]  [address]    Dear [name],  I am pleased to inform you that the colon polyp(s) removed during your recent colonoscopy was (were) found to be benign (no cancer detected) upon pathologic examination.  I recommend you have a repeat colonoscopy examination in 5_ years to look for recurrent polyps, as having colon polyps increases your risk for having recurrent polyps or even colon cancer in the future.  Should you develop new or worsening symptoms of abdominal pain, bowel habit changes or bleeding from the rectum or bowels, please schedule an evaluation with either your primary care physician or with me.  Additional information/recommendations:  __ No further action with gastroenterology is needed at this time. Please      follow-up with your primary care physician for your other healthcare      needs.  __ Please call [phone] to schedule a return visit to review your      situation.  __ Please keep your follow-up visit as already scheduled.  __x Continue treatment plan as outlined the day of your exam.  Please call us if you are having persistent problems or have questions about your condition that have not been fully answered at this time.  Sincerely,  Louis Meckel MD  This letter has been electronically signed by your physician.  Appended Document: Patient Notice- Polyp Results letter mailed

## 2010-11-06 LAB — CBC
HCT: 44.7 % (ref 36.0–46.0)
Hemoglobin: 13.9 g/dL (ref 12.0–15.0)
Hemoglobin: 14 g/dL (ref 12.0–15.0)
MCH: 28.8 pg (ref 26.0–34.0)
MCH: 29.3 pg (ref 26.0–34.0)
MCHC: 31.3 g/dL (ref 30.0–36.0)
MCHC: 32 g/dL (ref 30.0–36.0)
MCV: 91.6 fL (ref 78.0–100.0)
MCV: 92 fL (ref 78.0–100.0)
Platelets: 190 10*3/uL (ref 150–400)
RDW: 14.5 % (ref 11.5–15.5)

## 2010-11-06 LAB — DIFFERENTIAL
Eosinophils Absolute: 0.1 10*3/uL (ref 0.0–0.7)
Eosinophils Relative: 0 % (ref 0–5)
Lymphocytes Relative: 29 % (ref 12–46)
Lymphs Abs: 3.3 10*3/uL (ref 0.7–4.0)
Neutrophils Relative %: 59 % (ref 43–77)

## 2010-11-06 LAB — CARDIAC PANEL(CRET KIN+CKTOT+MB+TROPI): Relative Index: 1.8 (ref 0.0–2.5)

## 2010-11-06 LAB — GLUCOSE, CAPILLARY
Glucose-Capillary: 133 mg/dL — ABNORMAL HIGH (ref 70–99)
Glucose-Capillary: 159 mg/dL — ABNORMAL HIGH (ref 70–99)

## 2010-11-06 LAB — BASIC METABOLIC PANEL
BUN: 18 mg/dL (ref 6–23)
Calcium: 9 mg/dL (ref 8.4–10.5)
Chloride: 105 mEq/L (ref 96–112)
Sodium: 138 mEq/L (ref 135–145)

## 2010-11-06 LAB — CK TOTAL AND CKMB (NOT AT ARMC): Total CK: 129 U/L (ref 7–177)

## 2010-11-06 LAB — HEMOGLOBIN A1C
Hgb A1c MFr Bld: 6.5 % — ABNORMAL HIGH (ref <5.7)
Mean Plasma Glucose: 140 mg/dL — ABNORMAL HIGH (ref <117)

## 2010-11-06 LAB — PROTIME-INR
INR: 0.99 (ref 0.00–1.49)
Prothrombin Time: 13.3 seconds (ref 11.6–15.2)

## 2010-11-06 LAB — HEPARIN LEVEL (UNFRACTIONATED): Heparin Unfractionated: 0.17 IU/mL — ABNORMAL LOW (ref 0.30–0.70)

## 2010-11-06 LAB — BRAIN NATRIURETIC PEPTIDE: Pro B Natriuretic peptide (BNP): <30 pg/mL (ref 0.0–100.0)

## 2010-11-06 LAB — LIPID PANEL
HDL: 40 mg/dL (ref >39)
Triglycerides: 87 mg/dL (ref <150)

## 2010-11-06 LAB — POCT CARDIAC MARKERS
Myoglobin, poc: 105 ng/mL (ref 12–200)
Myoglobin, poc: 114 ng/mL (ref 12–200)
Troponin i, poc: <0.05 ng/mL (ref 0.00–0.09)

## 2010-11-06 LAB — APTT: aPTT: 25 seconds (ref 24–37)

## 2010-11-06 LAB — TROPONIN I: Troponin I: 0.01 ng/mL (ref 0.00–0.06)

## 2010-11-07 ENCOUNTER — Encounter: Payer: Self-pay | Admitting: Internal Medicine

## 2010-11-23 NOTE — Letter (Signed)
Summary: Delia Heady MD/[facility]  Delia Heady MD/[facility]   Imported By: Lester Merom 11/13/2010 09:33:45  _____________________________________________________________________  External Attachment:    Type:   Image     Comment:   External Document

## 2010-11-24 ENCOUNTER — Encounter: Payer: Self-pay | Admitting: Internal Medicine

## 2011-03-06 ENCOUNTER — Emergency Department (HOSPITAL_COMMUNITY)
Admission: EM | Admit: 2011-03-06 | Discharge: 2011-03-07 | Disposition: A | Discharge status: Home / Self Care | Payer: Medicare Other | Attending: Emergency Medicine | Admitting: Emergency Medicine

## 2011-03-06 DIAGNOSIS — S8010XA Contusion of unspecified lower leg, initial encounter: Secondary | ICD-10-CM | POA: Insufficient documentation

## 2011-03-06 DIAGNOSIS — X58XXXA Exposure to other specified factors, initial encounter: Secondary | ICD-10-CM | POA: Insufficient documentation

## 2011-03-06 DIAGNOSIS — M7989 Other specified soft tissue disorders: Secondary | ICD-10-CM | POA: Insufficient documentation

## 2011-03-07 LAB — BASIC METABOLIC PANEL
Calcium: 9.7 mg/dL (ref 8.4–10.5)
Chloride: 102 mEq/L (ref 96–112)
Creatinine, Ser: 0.78 mg/dL (ref 0.50–1.10)
GFR calc Af Amer: >60 mL/min (ref >60)

## 2011-03-07 LAB — PROTIME-INR: INR: 1.01 (ref 0.00–1.49)

## 2011-03-07 LAB — CBC
HCT: 42.6 % (ref 36.0–46.0)
MCV: 89.7 fL (ref 78.0–100.0)
Platelets: 179 10*3/uL (ref 150–400)
RBC: 4.75 MIL/uL (ref 3.87–5.11)
WBC: 9 10*3/uL (ref 4.0–10.5)

## 2011-03-07 LAB — APTT: aPTT: 31 seconds (ref 24–37)

## 2011-10-01 ENCOUNTER — Other Ambulatory Visit: Payer: Self-pay | Admitting: Internal Medicine

## 2011-10-01 DIAGNOSIS — Z1231 Encounter for screening mammogram for malignant neoplasm of breast: Secondary | ICD-10-CM

## 2011-10-10 ENCOUNTER — Ambulatory Visit
Admission: RE | Admit: 2011-10-10 | Discharge: 2011-10-10 | Disposition: A | Discharge status: Home / Self Care | Payer: Medicare Other | Source: Ambulatory Visit | Attending: Internal Medicine | Admitting: Internal Medicine

## 2011-10-10 ENCOUNTER — Other Ambulatory Visit: Payer: Self-pay | Admitting: Internal Medicine

## 2011-10-10 DIAGNOSIS — Z1231 Encounter for screening mammogram for malignant neoplasm of breast: Secondary | ICD-10-CM

## 2011-10-10 LAB — HM MAMMOGRAPHY: HM Mammogram: NORMAL

## 2011-10-16 ENCOUNTER — Emergency Department (HOSPITAL_COMMUNITY)
Admission: EM | Admit: 2011-10-16 | Discharge: 2011-10-16 | Disposition: A | Discharge status: Home Health | Payer: Medicare Other | Attending: Emergency Medicine | Admitting: Emergency Medicine

## 2011-10-16 ENCOUNTER — Encounter (HOSPITAL_COMMUNITY): Payer: Self-pay

## 2011-10-16 ENCOUNTER — Emergency Department (HOSPITAL_COMMUNITY): Payer: Medicare Other

## 2011-10-16 DIAGNOSIS — M79609 Pain in unspecified limb: Secondary | ICD-10-CM | POA: Diagnosis not present

## 2011-10-16 DIAGNOSIS — M25569 Pain in unspecified knee: Secondary | ICD-10-CM | POA: Diagnosis not present

## 2011-10-16 DIAGNOSIS — M25579 Pain in unspecified ankle and joints of unspecified foot: Secondary | ICD-10-CM | POA: Diagnosis not present

## 2011-10-16 DIAGNOSIS — M25469 Effusion, unspecified knee: Secondary | ICD-10-CM | POA: Insufficient documentation

## 2011-10-16 DIAGNOSIS — Z79899 Other long term (current) drug therapy: Secondary | ICD-10-CM | POA: Diagnosis not present

## 2011-10-16 DIAGNOSIS — W108XXA Fall (on) (from) other stairs and steps, initial encounter: Secondary | ICD-10-CM | POA: Insufficient documentation

## 2011-10-16 DIAGNOSIS — W19XXXA Unspecified fall, initial encounter: Secondary | ICD-10-CM

## 2011-10-16 HISTORY — DX: Cardiac murmur, unspecified: R01.1

## 2011-10-16 MED ORDER — OXYCODONE-ACETAMINOPHEN 5-325 MG PO TABS: 1.0000 | ORAL_TABLET | Freq: Once | ORAL | Status: DC

## 2011-10-16 MED ORDER — TRAMADOL HCL 50 MG PO TABS: 50.0000 mg | ORAL_TABLET | Freq: Four times a day (QID) | ORAL | Status: AC | PRN

## 2011-10-16 MED ORDER — ONDANSETRON 4 MG PO TBDP
4.0000 mg | ORAL_TABLET | Freq: Once | ORAL | Status: AC
  Administered 2011-10-16: 4 mg via ORAL
  Filled 2011-10-16: qty 1

## 2011-10-16 MED ORDER — OXYCODONE-ACETAMINOPHEN 5-325 MG PO TABS
1.0000 | ORAL_TABLET | Freq: Once | ORAL | Status: DC
  Filled 2011-10-16: qty 1

## 2011-10-16 MED ORDER — MORPHINE SULFATE 4 MG/ML IJ SOLN
4.0000 mg | Freq: Once | INTRAMUSCULAR | Status: AC
  Administered 2011-10-16: 4 mg via INTRAMUSCULAR
  Filled 2011-10-16: qty 1

## 2011-10-16 NOTE — ED Provider Notes (Signed)
Medical screening examination/treatment/procedure(s) were performed by non-physician practitioner and as supervising physician I was immediately available for consultation/collaboration.  Doug Sou, MD 10/16/11 2046

## 2011-10-16 NOTE — ED Notes (Signed)
Pt returned from xray

## 2011-10-16 NOTE — ED Notes (Signed)
Pt. Fell on Sunday and woke up with rt. Knee /leg pain on Monday, no deformity noted

## 2011-10-16 NOTE — ED Provider Notes (Signed)
History     CSN: [id number]  Arrival date & time 10/16/11  1346   First MD Initiated Contact with Patient 10/16/11 1611      Chief Complaint  Patient presents with  . Fall    (Consider location/radiation/quality/duration/timing/severity/associated sxs/prior treatment) HPI  The patient is a morbidly obese lady who presents to the emergency department with complaints of right leg pain after falling on Sunday down 2 stairs. The patient states she has a neurologist who she has spoken with about her falls previously over the past couple of weeks. She denies injuring her head, loss of consciousness, falling because of syncope. She states that her knee and her calf hurts the worst and that the pain is severe 10 out of 10, sharp, aching, and throbbing. She has not tried anything for pain at home. The patient uses a cane to ambulate already at home and has for the last 16 years due to back problems. Past Medical History  Diagnosis Date  . Heart murmur     History reviewed. No pertinent past surgical history.  No family history on file.  History  Substance Use Topics  . Smoking status: Former Games developer  . Smokeless tobacco: Not on file  . Alcohol Use: No    OB History    Grav Para Term Preterm Abortions TAB SAB Ect Mult Living                  Review of Systems  All other systems reviewed and are negative.    Allergies  Review of patient's allergies indicates no known allergies.  Home Medications   Current Outpatient Rx  Name Route Sig Dispense Refill  . TIZANIDINE HCL 2 MG PO TABS Oral Take 2 mg by mouth every 8 (eight) hours as needed. For migraine      BP 143/64  Pulse 82  Temp(Src) 98.9 F (37.2 C) (Oral)  Resp 12  Ht 5\' 5"  (1.651 m)  Wt 317 lb (143.79 kg)  BMI 52.75 kg/m2  SpO2 92%  Physical Exam  Constitutional: She appears well-developed and well-nourished.  HENT:  Head: Normocephalic and atraumatic.  Eyes: Conjunctivae are normal. Pupils are equal,  round, and reactive to light.  Neck: Trachea normal, normal range of motion and full passive range of motion without pain. Neck supple.  Cardiovascular: Normal rate, regular rhythm and normal pulses.   Pulmonary/Chest: Effort normal. Chest wall is not dull to percussion. She exhibits no tenderness, no crepitus, no edema, no deformity and no retraction.  Abdominal: Soft. Normal appearance.  Musculoskeletal: She exhibits tenderness.       Right hip: She exhibits decreased range of motion (chronic DROM). She exhibits no tenderness, no bony tenderness, no swelling, no crepitus, no deformity and no laceration.       Right knee: She exhibits decreased range of motion and effusion. She exhibits no swelling, no ecchymosis, no deformity, no laceration and no erythema.       Right lower leg: She exhibits tenderness and swelling. She exhibits no bony tenderness, no edema, no deformity and no laceration.  Neurological: She is alert. She has normal strength.  Skin: Skin is warm, dry and intact.  Psychiatric: Her speech is normal. Cognition and memory are normal.    ED Course  Procedures (including critical care time)  Labs Reviewed - No data to display Dg Tibia/fibula Right  10/16/2011  *RADIOLOGY REPORT*  Clinical Data: 66 year old female status post fall with pain.  RIGHT TIBIA AND FIBULA - 2  VIEW  Comparison: Right knee series from the same day.  Findings: Degenerative changes re-identified at the right knee. Alignment at the right ankle within normal limits.  Calcaneus appears intact. Bone mineralization is within normal limits. No fracture or dislocation identified in the right tibia or fibula.  IMPRESSION: No acute fracture or dislocation identified about the right tib- fib.  .  Original Report Authenticated By: Harley Hallmark, M.D.   Dg Ankle Complete Right  10/16/2011  *RADIOLOGY REPORT*  Clinical Data: 66 year old female status post fall with pain.  RIGHT ANKLE - COMPLETE 3+ VIEW  Comparison: Right  tib-fib series from the same day.  Findings: No ankle joint effusion.  Mortise joint alignment within normal limits.  Calcaneus intact with degenerative spurring.  No acute fracture or dislocation identified about the ankle.  IMPRESSION: No acute fracture or dislocation identified about the right ankle.  Original Report Authenticated By: Harley Hallmark, M.D.   Dg Knee Complete 4 Views Right  10/16/2011  *RADIOLOGY REPORT*  Clinical Data: Larey Seat 2 days ago with pain and swelling.  RIGHT KNEE - COMPLETE 4+ VIEW  Comparison: None  Findings: There is tricompartmental degenerative joint disease primarily involving the medial and patellofemoral compartments with loss of joint space, sclerosis, and spurring.  No fracture is seen. There does appear to be a small knee joint effusion present.  IMPRESSION: Tricompartmental degenerative joint disease.  Small knee joint effusion.  Original Report Authenticated By: Juline Patch, M.D.     1. Fall   2. Knee pain       MDM  Pt has walking assistance, a cain, at home already and declines the offer for a walker or wheel chair. Pt is being given a referral for Ortho as well as some Tramadol for pain.          Dorthula Matas, PA 10/16/11 2012

## 2011-10-16 NOTE — ED Notes (Signed)
Patient transported to X-ray 

## 2011-10-16 NOTE — Discharge Instructions (Signed)
Knee Pain The knee is the complex joint between your thigh and your lower leg. It is made up of bones, tendons, ligaments, and cartilage. The bones that make up the knee are:  The femur in the thigh.   The tibia and fibula in the lower leg.   The patella or kneecap riding in the groove on the lower femur.  CAUSES  Knee pain is a common complaint with many causes. A few of these causes are:  Injury, such as:   A ruptured ligament or tendon injury.   Torn cartilage.   Medical conditions, such as:   Gout   Arthritis   Infections   Overuse, over training or overdoing a physical activity.  Knee pain can be minor or severe. Knee pain can accompany debilitating injury. Minor knee problems often respond well to self-care measures or get well on their own. More serious injuries may need medical intervention or even surgery. SYMPTOMS The knee is complex. Symptoms of knee problems can vary widely. Some of the problems are:  Pain with movement and weight bearing.   Swelling and tenderness.   Buckling of the knee.   Inability to straighten or extend your knee.   Your knee locks and you cannot straighten it.   Warmth and redness with pain and fever.   Deformity or dislocation of the kneecap.  DIAGNOSIS  Determining what is wrong may be very straight forward such as when there is an injury. It can also be challenging because of the complexity of the knee. Tests to make a diagnosis may include:  Your caregiver taking a history and doing a physical exam.   Routine X-rays can be used to rule out other problems. X-rays will not reveal a cartilage tear. Some injuries of the knee can be diagnosed by:   Arthroscopy a surgical technique by which a small video camera is inserted through tiny incisions on the sides of the knee. This procedure is used to examine and repair internal knee joint problems. Tiny instruments can be used during arthroscopy to repair the torn knee cartilage  (meniscus).   Arthrography is a radiology technique. A contrast liquid is directly injected into the knee joint. Internal structures of the knee joint then become visible on X-ray film.   An MRI scan is a non x-ray radiology procedure in which magnetic fields and a computer produce two- or three-dimensional images of the inside of the knee. Cartilage tears are often visible using an MRI scanner. MRI scans have largely replaced arthrography in diagnosing cartilage tears of the knee.   Blood work.   Examination of the fluid that helps to lubricate the knee joint (synovial fluid). This is done by taking a sample out using a needle and a syringe.  TREATMENT The treatment of knee problems depends on the cause. Some of these treatments are:  Depending on the injury, proper casting, splinting, surgery or physical therapy care will be needed.   Give yourself adequate recovery time. Do not overuse your joints. If you begin to get sore during workout routines, back off. Slow down or do fewer repetitions.   For repetitive activities such as cycling or running, maintain your strength and nutrition.   Alternate muscle groups. For example if you are a weight lifter, work the upper body on one day and the lower body the next.   Either tight or weak muscles do not give the proper support for your knee. Tight or weak muscles do not absorb the stress placed   on the knee joint. Keep the muscles surrounding the knee strong.   Take care of mechanical problems.   If you have flat feet, orthotics or special shoes may help. See your caregiver if you need help.   Arch supports, sometimes with wedges on the inner or outer aspect of the heel, can help. These can shift pressure away from the side of the knee most bothered by osteoarthritis.   A brace called an "unloader" brace also may be used to help ease the pressure on the most arthritic side of the knee.   If your caregiver has prescribed crutches, braces,  wraps or ice, use as directed. The acronym for this is PRICE. This means protection, rest, ice, compression and elevation.   Nonsteroidal anti-inflammatory drugs (NSAID's), can help relieve pain. But if taken immediately after an injury, they may actually increase swelling. Take NSAID's with food in your stomach. Stop them if you develop stomach problems. Do not take these if you have a history of ulcers, stomach pain or bleeding from the bowel. Do not take without your caregiver's approval if you have problems with fluid retention, heart failure, or kidney problems.   For ongoing knee problems, physical therapy may be helpful.   Glucosamine and chondroitin are over-the-counter dietary supplements. Both may help relieve the pain of osteoarthritis in the knee. These medicines are different from the usual anti-inflammatory drugs. Glucosamine may decrease the rate of cartilage destruction.   Injections of a corticosteroid drug into your knee joint may help reduce the symptoms of an arthritis flare-up. They may provide pain relief that lasts a few months. You may have to wait a few months between injections. The injections do have a small increased risk of infection, water retention and elevated blood sugar levels.   Hyaluronic acid injected into damaged joints may ease pain and provide lubrication. These injections may work by reducing inflammation. A series of shots may give relief for as long as 6 months.   Topical painkillers. Applying certain ointments to your skin may help relieve the pain and stiffness of osteoarthritis. Ask your pharmacist for suggestions. Many over the-counter products are approved for temporary relief of arthritis pain.   In some countries, doctors often prescribe topical NSAID's for relief of chronic conditions such as arthritis and tendinitis. A review of treatment with NSAID creams found that they worked as well as oral medications but without the serious side effects.    PREVENTION  Maintain a healthy weight. Extra pounds put more strain on your joints.   Get strong, stay limber. Weak muscles are a common cause of knee injuries. Stretching is important. Include flexibility exercises in your workouts.   Be smart about exercise. If you have osteoarthritis, chronic knee pain or recurring injuries, you may need to change the way you exercise. This does not mean you have to stop being active. If your knees ache after jogging or playing basketball, consider switching to swimming, water aerobics or other low-impact activities, at least for a few days a week. Sometimes limiting high-impact activities will provide relief.   Make sure your shoes fit well. Choose footwear that is right for your sport.   Protect your knees. Use the proper gear for knee-sensitive activities. Use kneepads when playing volleyball or laying carpet. Buckle your seat belt every time you drive. Most shattered kneecaps occur in car accidents.   Rest when you are tired.  SEEK MEDICAL CARE IF:  You have knee pain that is continual and does not   seem to be getting better.  SEEK IMMEDIATE MEDICAL CARE IF:  Your knee joint feels hot to the touch and you have a high fever. MAKE SURE YOU:   Understand these instructions.   Will watch your condition.   Will get help right away if you are not doing well or get worse.  Document Released: 06/10/2007 Document Revised: 04/25/2011 Document Reviewed: 06/10/2007 [facility] Patient Information 2012 [place], [place].Knee Wraps (Elastic Bandage) and RICE Knee wraps come in many different shapes and sizes and perform many different functions. Some wraps may provide cold therapy or warmth. Your caregiver will help you to determine what is best for your protection, or recovery following your injury. The following are some general tips to help you use a knee wrap:  Use the wrap as directed.   Do not keep the wrap so tight that it cuts off the circulation of the  leg below the wrap.   If your lower leg becomes blue, loses feeling, or becomes swollen below the wrap, it is probably too tight. Loosen the wrap as needed to improve these problems.   See your caregiver or trainer if the wrap seems to be making your problems worse rather than better.  Wraps in general help to remind you that you have an injury. They provide limited support. The few pounds of support they provide are minimal considering the hundreds of pounds of pressure it takes to injure a joint or tear ligaments.  The routine care of many injuries includes Rest, Ice, Compression, and Elevation (RICE).  Rest is required to allow your body to heal. Generally following bumps and bruises, routine activities can be resumed when comfortable. Injured tendons (cord-like structures that attach muscle to bone) and bones take approximately 6 to 12 weeks to heal.   Ice following an injury helps keep the swelling down and reduces pain. Do not apply ice directly to skin. Apply ice bags for 20-30 minutes every 3-4 hours for the first 2-3 days following injury or surgery. Place ice in a plastic bag with a towel around it.   Compression helps keep swelling down, gives support, and helps with discomfort. If a knee wrap has been applied, it should be removed and reapplied every 3 to 4 hours. It should be applied firmly enough to keep swelling down, but not too tightly. Watch your lower leg and toes for swelling, bluish discoloration, coldness, numbness or excessive pain. If any of these symptoms (problems) occur, remove the knee wrap and reapply more loosely. If these symptoms persist, contact your caregiver immediately.   Elevation helps reduce swelling, and decreases pain. With extremities (arms/hands and legs/feet), the injured area should be placed near to or above the level of the heart if possible.  Persistent pain and inability to use the injured area for more than 2 to 3 days are warning signs indicating that  you should see a caregiver for a follow-up visit as soon as possible. Initially, a hairline fracture (this is the same as a broken bone) may not be seen on x-rays.  Persistent pain and swelling mean limitedweight bearing (use of crutches as instructed) should continue. You may need further x-rays.  X-rays may not show a non-displaced fracture until a week or ten days later. Make a follow-up appointment with your caregiver. A radiologist (a specialist in reading x-rays) will re-read your x-rays. Make sure you know how to get your x-ray results. Do not assume everything is normal if you do not hear from your caregiver.  MAKE SURE YOU:   Understand these instructions.   Will watch your condition.   Will get help right away if you are not doing well or get worse.  Document Released: 02/02/2002 Document Revised: 04/25/2011 Document Reviewed: 12/02/2008 [facility] Patient Information 2012 [place], [place].

## 2011-10-16 NOTE — ED Notes (Signed)
Pt ambulated to restroom with cane, with assistance

## 2012-03-27 DIAGNOSIS — R51 Headache: Secondary | ICD-10-CM | POA: Diagnosis not present

## 2012-03-27 DIAGNOSIS — G43019 Migraine without aura, intractable, without status migrainosus: Secondary | ICD-10-CM | POA: Diagnosis not present

## 2012-03-27 DIAGNOSIS — M542 Cervicalgia: Secondary | ICD-10-CM | POA: Diagnosis not present

## 2012-06-30 DIAGNOSIS — M542 Cervicalgia: Secondary | ICD-10-CM | POA: Diagnosis not present

## 2012-06-30 DIAGNOSIS — R51 Headache: Secondary | ICD-10-CM | POA: Diagnosis not present

## 2012-10-03 ENCOUNTER — Encounter: Payer: Self-pay | Admitting: Internal Medicine

## 2012-10-07 ENCOUNTER — Encounter: Payer: Self-pay | Admitting: Neurology

## 2012-11-11 ENCOUNTER — Encounter: Payer: Self-pay | Admitting: Neurology

## 2012-11-11 ENCOUNTER — Ambulatory Visit (INDEPENDENT_AMBULATORY_CARE_PROVIDER_SITE_OTHER): Payer: Medicare Other | Admitting: Neurology

## 2012-11-11 VITALS — BP 167/93 | HR 65 | Temp 98.7°F | Ht 66.0 in | Wt 348.0 lb

## 2012-11-11 DIAGNOSIS — G43919 Migraine, unspecified, intractable, without status migrainosus: Secondary | ICD-10-CM

## 2012-11-11 MED ORDER — DIVALPROEX SODIUM ER 500 MG PO TB24: 500.0000 mg | ORAL_TABLET | Freq: Every day | ORAL | Status: DC

## 2012-11-11 NOTE — Patient Instructions (Signed)
I have advised the patient to take Depakote ER 500 mg tablets once daily for one week and then increase to 2 tablets daily. I have given her samples to last her 2-3 weeks and advised her to call me for a prescription if she finds it effective. I have also advised her to limit taking Goody powders to less than 2-3 days a week to avoid and analgesic rebound. I have encouraged her to do daily neck stretching exercises as well as increase participation in stress relaxation activities like regular exercise, swimming, meditation and yoga. I have advised her to maintain a strict headache diary and return for followup in 2 months with Janice Morrow, nurse practitioner or call earlier if necessary.

## 2012-11-11 NOTE — Progress Notes (Signed)
Subjective:    Patient ID: Janice Morrow is a 67 y.o. female.  HPI 67 year old lady with chronic headaches likely transform migraines with and algesic rebound. She has failed trials of tramadol, trexomet,tylenol,, goody powder, Percocet, Topamax, Botox, Depakote and Zanaflex in the past. She has underlying sleep apnea and musculoskeletal neck and shoulder pain as contributing triggers for her headache. She returns today for followup after last visit on 06/30/12 requesting urgent appointment because of worsening headaches. She states for the last couple of months headaches have gotten worse and have now become daily. She has been taking migraine Excedrin on a daily basis for couple of weeks without much relief and has now switched to taking Goody powders with similar results. She did obtain relief with Zanaflex which had prescribed in the past but her insurance company is refusing to cover it for headache. She has not been doing regular neck stretching exercises of participating in any stress relaxation activities.    Review of Systems A 14 system review of systems wallsPositive for headache, dizziness, trauma, lack of sleep and joint pain only. Objective:  Neurologic Exam Awake alert oriented x3 with normal speech and language function. Intact attention, registration and recall. Cranial nerve exam reveals full range of eye movements without nystagmus. Pupils are equal reactive. Fundi show no papilledema. Face is symmetric without weakness. Palatal moments normal. Tongue is midline.  Motor system exam reveals symmetric upper and lower extremity strength without any drift or focal weakness. Tone is normal.  Deep tendon reflexes are 2 per symmetric except ankle jerks are depressed.  Sensory system exam reveals normal touch, pinprick sensation and position and vibration.  Gait is steady including tandem walking. Plantars are downgoing Physical Exam Obese middle-aged [place] American lady who  appears not to be in distress. Head is not dramatic. Neck is supple. There is mild spasm of the posterior neck and upper scapular muscles which appear tight. Cardiac exam the murmur gallops. Lungs are clear to auscultation. Skin shows no rash. There is no pedal edema. Hearing appears normal Assessment:   76 her lady with chronic headaches likely transformed migraine headaches with and analgesic rebound who has failed trials of multiple medications in the past.     Plan:   I had a long discussion with the patient with regards to analgesic rebound headache and advised her to discontinue migraine excedrin, goody powder medications that she's taking which is contributing to her headaches. The patient unfortunately is unable to get Zanaflex due to insurance reasons. I will recommend a repeat trial of Depakote ER find a milligrams daily. I gave her samples and a prescription to be filled later. High would strongly encourage her to limit taking Goody powders not more than 2 days a week.I also encouraged her to keep a strict headache diary as well as to do neck stretching exercises and participate in activities for stress  Relaxation like regular exercise, swimming, meditation and yoga. Return for followup with Darrol Angel nurse practitioner

## 2013-01-13 ENCOUNTER — Ambulatory Visit: Payer: Self-pay | Admitting: Neurology

## 2013-01-13 ENCOUNTER — Telehealth: Payer: Self-pay | Admitting: Neurology

## 2013-05-04 ENCOUNTER — Ambulatory Visit (INDEPENDENT_AMBULATORY_CARE_PROVIDER_SITE_OTHER): Payer: Medicare Other | Admitting: Neurology

## 2013-05-04 ENCOUNTER — Encounter: Payer: Self-pay | Admitting: Neurology

## 2013-05-04 VITALS — BP 158/89 | HR 72 | Temp 98.3°F | Ht 65.5 in | Wt 346.5 lb

## 2013-05-04 DIAGNOSIS — G500 Trigeminal neuralgia: Secondary | ICD-10-CM | POA: Diagnosis not present

## 2013-05-04 MED ORDER — GABAPENTIN 300 MG PO CAPS: 300.0000 mg | ORAL_CAPSULE | Freq: Three times a day (TID) | ORAL | Status: DC

## 2013-05-04 MED ORDER — TIZANIDINE HCL 2 MG PO TABS: 2.0000 mg | ORAL_TABLET | Freq: Three times a day (TID) | ORAL | Status: DC | PRN

## 2013-05-04 NOTE — Patient Instructions (Addendum)
Start Gabapentin 300 mg twice a day.  After 1 week increase to three times a day.  We will check lab work today.  Follow up visit in 1 month.

## 2013-05-04 NOTE — Progress Notes (Signed)
[facility]  PATIENT: Janice Morrow DOB: 05/22/1946   REASON FOR VISIT: follow up HISTORY FROM: patient  HISTORY OF PRESENT ILLNESS: UPDATE 05/04/13 (LL):  Patient returns for follow up for headaches.  She states that the Depakote given at last visit did not provide any relief.  The pain is right-sided, is a sharp shooting pain that starts in the right temporal region that comes around the face to the cheek and mandibular area.  She can feel pulsating pain in her temple and the pain is stabbing in quality.  She has pain in the jaw which makes it difficult to eat.  She also has intermittent numbness under and behind her right ear.  She had returned to [place] powders for a while but states she has not taken any in 2 weeks. Denies nausea, photophobia, or phonophobia.  PRIOR HPI 11/11/12 (PS):  67 year old lady with chronic headaches likely transform migraines with and algesic rebound. She has failed trials of tramadol, trexomet,tylenol,, goody powder, Percocet, Topamax, Botox, Depakote and Zanaflex in the past. She has underlying sleep apnea and musculoskeletal neck and shoulder pain as contributing triggers for her headache.  She returns today for followup after last visit on 06/30/12 requesting urgent appointment because of worsening headaches. She states for the last couple of months headaches have gotten worse and have now become daily. She has been taking migraine Excedrin on a daily basis for couple of weeks without much relief and has now switched to taking Goody powders with similar results. She did obtain relief with Zanaflex which had prescribed in the past but her insurance company is refusing to cover it for headache. She has not been doing regular neck stretching exercises of participating in any stress relaxation activities.   Review of Systems  A 14 system review of systems is Positive for headache, dizziness, trauma, lack of sleep and joint pain  only.  ALLERGIES: No Known Allergies  HOME MEDICATIONS: Outpatient Prescriptions Prior to Visit  Medication Sig Dispense Refill  . divalproex (DEPAKOTE ER) 500 MG 24 hr tablet Take 1 tablet (500 mg total) by mouth daily.  32 tablet  0  . tiZANidine (ZANAFLEX) 2 MG tablet Take 2 mg by mouth every 8 (eight) hours as needed. For migraine       No facility-administered medications prior to visit.    PAST MEDICAL HISTORY: Past Medical History  Diagnosis Date  . Heart murmur   . Headache(784.0)   . COPD (chronic obstructive pulmonary disease)   . GERD (gastroesophageal reflux disease)   . PVD (peripheral vascular disease)   . Hyperlipidemia   . Cervicalgia   . Migraine without aura, with intractable migraine, so stated, without mention of status migrainosus   . Migraine without aura, with intractable migraine, so stated, without mention of status migrainosus   . PVD (peripheral vascular disease)   . Osteoarthritis     PAST SURGICAL HISTORY: Past Surgical History  Procedure Laterality Date  . Dental surgery      FAMILY HISTORY: Family History  Problem Relation Age of Onset  . Heart attack Father   . Heart disease    . Diabetes      SOCIAL HISTORY: History   Social History  . Marital Status: Single    Spouse Name: N/A    Number of Children: 1  . Years of Education: 12th   Occupational History  . DISABLED    Social History Main Topics  . Smoking status: Former Smoker  Types: Cigarettes    Quit date: 08/22/2010  . Smokeless tobacco: Not on file  . Alcohol Use: No  . Drug Use: No  . Sexual Activity: No   Other Topics Concern  . Not on file   Social History Narrative   Patient lives at home with sister.   Caffeine Use: 5-6 20oz bottle sodas daily     PHYSICAL EXAM  Filed Vitals:   05/04/13 1459  BP: 158/89  Pulse: 72  Temp: 98.3 F (36.8 C)  TempSrc: Oral  Height: 5' 5.5" (1.664 m)  Weight: 346 lb 8 oz (157.171 kg)   Body mass index is 56.76  kg/(m^2).  Neurologic Exam  Awake alert oriented x3 with normal speech and language function. Intact attention, registration and recall.  Cranial nerve exam reveals full range of eye movements without nystagmus. Pupils are equal reactive. Fundi show no papilledema. Face is symmetric without weakness. Palatal moments normal. Tongue is midline.  Motor system exam reveals symmetric upper and lower extremity strength without any drift or focal weakness. Tone is normal.  Deep tendon reflexes are 2 per symmetric except ankle jerks are depressed.  Sensory system exam reveals normal touch, pinprick sensation and position and vibration.  Gait is steady including tandem walking. Plantars are downgoing  Physical Exam  Obese middle-aged [place] American lady who appears not to be in distress. Head is not dramatic. Neck is supple. There is mild spasm of the posterior neck and upper scapular muscles which appear tight. Cardiac exam the murmur gallops. Lungs are clear to auscultation. Skin shows no rash. There is no pedal edema. Hearing appears normal   ASSESSMENT AND PLAN 83 her lady with chronic headaches likely transformed migraine headaches with analgesic rebound who has failed trials of multiple medications in the past.  Description of headache has changed and are concerning for trigeminal nerve pain or temporal arteritis.  PLAN: 1. Check ESR, CBC, and CMP. 2. Start Gabapentin 300 mg BID, after 1 week increase to TID. 3. Reordered Tizanadine without mention of Migraine, labeled indication for Neuralgia. 4. Follow up in 1 month.  Meds ordered this encounter  Medications  . gabapentin (NEURONTIN) 300 MG capsule    Sig: Take 1 capsule (300 mg total) by mouth 3 (three) times daily.    Dispense:  90 capsule    Refill:  11    Order Specific Question:  Supervising Provider    Answer:  Micki Riley [[id number]]  . tiZANidine (ZANAFLEX) 2 MG tablet    Sig: Take 1 tablet (2 mg total) by mouth every 8 (eight)  hours as needed.    Dispense:  30 tablet    Refill:  0    Order Specific Question:  Supervising Provider    Answer:  Micki Riley [[id number]]    Orders Placed This Encounter  Procedures  . Sedimentation Rate  . CBC (no diff)  . CMP   Tawny Asal Ryu Cerreta, MSN, NP-C 05/04/2013, 5:05 PM [name] [address] [place], [place] [address] [phone]

## 2013-05-05 LAB — CBC
HCT: 39.7 % (ref 34.0–46.6)
Hemoglobin: 13 g/dL (ref 11.1–15.9)
MCH: 28 pg (ref 26.6–33.0)
MCHC: 32.7 g/dL (ref 31.5–35.7)
MCV: 85 fL (ref 79–97)
Platelets: 227 10*3/uL (ref 150–379)
RBC: 4.65 x10E6/uL (ref 3.77–5.28)
RDW: 15.3 % (ref 12.3–15.4)
WBC: 8.4 10*3/uL (ref 3.4–10.8)

## 2013-05-05 LAB — COMPREHENSIVE METABOLIC PANEL
ALT: 19 IU/L (ref 0–32)
AST: 19 IU/L (ref 0–40)
Albumin/Globulin Ratio: 1.4 (ref 1.1–2.5)
Albumin: 4 g/dL (ref 3.6–4.8)
Alkaline Phosphatase: 70 IU/L (ref 39–117)
BUN/Creatinine Ratio: 16 (ref 11–26)
BUN: 16 mg/dL (ref 8–27)
CO2: 24 mmol/L (ref 18–29)
Calcium: 9.3 mg/dL (ref 8.6–10.2)
Chloride: 102 mmol/L (ref 97–108)
Creatinine, Ser: 1.01 mg/dL — ABNORMAL HIGH (ref 0.57–1.00)
GFR calc Af Amer: 68 mL/min/{1.73_m2} (ref >59)
GFR calc non Af Amer: 59 mL/min/{1.73_m2} — ABNORMAL LOW (ref >59)
Globulin, Total: 2.8 g/dL (ref 1.5–4.5)
Glucose: 102 mg/dL — ABNORMAL HIGH (ref 65–99)
Potassium: 4.4 mmol/L (ref 3.5–5.2)
Sodium: 139 mmol/L (ref 134–144)
Total Bilirubin: 0.2 mg/dL (ref 0.0–1.2)
Total Protein: 6.8 g/dL (ref 6.0–8.5)

## 2013-05-05 LAB — SEDIMENTATION RATE: Sed Rate: 35 mm/hr (ref 0–40)

## 2013-05-07 ENCOUNTER — Telehealth: Payer: Self-pay | Admitting: *Deleted

## 2013-05-07 NOTE — Telephone Encounter (Signed)
Spoke to patient and she is aware of lab results.  

## 2013-05-07 NOTE — Telephone Encounter (Signed)
Message copied by Ardeth Sportsman on Thu May 07, 2013  9:23 AM ------      Message from: LAM, Larita Fife E      Created: Tue May 05, 2013  9:14 AM       Normal results, please call patient. ------

## 2013-06-15 ENCOUNTER — Ambulatory Visit: Payer: Medicare Other | Admitting: Neurology

## 2013-06-17 ENCOUNTER — Encounter: Payer: Self-pay | Admitting: Neurology

## 2013-06-17 ENCOUNTER — Ambulatory Visit (INDEPENDENT_AMBULATORY_CARE_PROVIDER_SITE_OTHER): Payer: Medicare Other | Admitting: Neurology

## 2013-06-17 VITALS — BP 161/87 | HR 71 | Temp 99.1°F | Ht 65.5 in | Wt 331.0 lb

## 2013-06-17 DIAGNOSIS — G500 Trigeminal neuralgia: Secondary | ICD-10-CM | POA: Diagnosis not present

## 2013-06-17 MED ORDER — GABAPENTIN 600 MG PO TABS: 600.0000 mg | ORAL_TABLET | Freq: Three times a day (TID) | ORAL | Status: DC

## 2013-06-17 NOTE — Progress Notes (Signed)
[facility]  PATIENT: Janice Morrow DOB: 1946/05/30   REASON FOR VISIT: follow up HISTORY FROM: patient  HISTORY OF PRESENT ILLNESS: UPDATE 05/04/13 (LL):  Patient returns for follow up for headaches.  She states that the Depakote given at last visit did not provide any relief.  The pain is right-sided, is a sharp shooting pain that starts in the right temporal region that comes around the face to the cheek and mandibular area.  She can feel pulsating pain in her temple and the pain is stabbing in quality.  She has pain in the jaw which makes it difficult to eat.  She also has intermittent numbness under and behind her right ear.  She had returned to [place] powders for a while but states she has not taken any in 2 weeks. Denies nausea, photophobia, or phonophobia.  PRIOR HPI 11/11/12 (PS):  67 year old lady with chronic headaches likely transform migraines with and algesic rebound. She has failed trials of tramadol, trexomet,tylenol,, goody powder, Percocet, Topamax, Botox, Depakote and Zanaflex in the past. She has underlying sleep apnea and musculoskeletal neck and shoulder pain as contributing triggers for her headache.  She returns today for followup after last visit on 06/30/12 requesting urgent appointment because of worsening headaches. She states for the last couple of months headaches have gotten worse and have now become daily. She has been taking migraine Excedrin on a daily basis for couple of weeks without much relief and has now switched to taking Goody powders with similar results. She did obtain relief with Zanaflex which had prescribed in the past but her insurance company is refusing to cover it for headache. She has not been doing regular neck stretching exercises of participating in any stress relaxation activities.  Update 06/17/13   She returns for followup after last visit on 05/04/13 with Larita Fife, nurse practitioner. She states improvement in her headaches after  starting gabapentin which she takes 300 mg 3 times daily. She has had these headaches only twice in October so far and twice in last month as well. She has discontinued Zanaflex and does not take it anymore. She did have lab work on 05/04/13 which included ESR, CBC and BMP which were normal. She states she's lost 15 pounds and seemed surprised and happy about it. She has no new complaints today. Review of Systems  A 14 system review of systems is Positive for headache, numbness, dizziness, sleepiness only  ALLERGIES: No Known Allergies  HOME MEDICATIONS: Outpatient Prescriptions Prior to Visit  Medication Sig Dispense Refill  . gabapentin (NEURONTIN) 300 MG capsule Take 1 capsule (300 mg total) by mouth 3 (three) times daily.  90 capsule  11  . tiZANidine (ZANAFLEX) 2 MG tablet Take 1 tablet (2 mg total) by mouth every 8 (eight) hours as needed.  30 tablet  0   No facility-administered medications prior to visit.    PAST MEDICAL HISTORY: Past Medical History  Diagnosis Date  . Heart murmur   . Headache(784.0)   . COPD (chronic obstructive pulmonary disease)   . GERD (gastroesophageal reflux disease)   . PVD (peripheral vascular disease)   . Hyperlipidemia   . Cervicalgia   . Migraine without aura, with intractable migraine, so stated, without mention of status migrainosus   . Migraine without aura, with intractable migraine, so stated, without mention of status migrainosus   . PVD (peripheral vascular disease)   . Osteoarthritis     PAST SURGICAL HISTORY: Past Surgical History  Procedure Laterality  Date  . Dental surgery      FAMILY HISTORY: Family History  Problem Relation Age of Onset  . Heart attack Father   . Heart disease    . Diabetes      SOCIAL HISTORY: History   Social History  . Marital Status: Single    Spouse Name: N/A    Number of Children: 1  . Years of Education: 12th   Occupational History  . DISABLED    Social History Main Topics  . Smoking  status: Former Smoker    Types: Cigarettes    Quit date: 08/22/2010  . Smokeless tobacco: Never Used  . Alcohol Use: No  . Drug Use: No  . Sexual Activity: No   Other Topics Concern  . Not on file   Social History Narrative   Patient lives at home with sister.   Caffeine Use: 5-6 20oz bottle sodas daily     PHYSICAL EXAM  Filed Vitals:   06/17/13 1432  BP: 161/87  Pulse: 71  Temp: 99.1 F (37.3 C)  TempSrc: Oral  Height: 5' 5.5" (1.664 m)  Weight: 331 lb (150.141 kg)   Body mass index is 54.22 kg/(m^2). Physical Exam  Obese middle-aged [place] American lady who appears not to be in distress. Head is non traumatic. Neck is supple. There is mild spasm of the posterior neck and upper scapular muscles which appear tight. Cardiac exam the murmur gallops. Lungs are clear to auscultation. Skin shows no rash. There is no pedal edema. Hearing appears normal  Neurologic Exam  Awake alert oriented x3 with normal speech and language function. Intact attention, registration and recall.  Cranial nerve exam reveals full range of eye movements without nystagmus. Pupils are equal reactive. Fundi show no papilledema. Face is symmetric without weakness. Palatal moments normal. Tongue is midline.  Motor system exam reveals symmetric upper and lower extremity strength without any drift or focal weakness. Tone is normal.  Deep tendon reflexes are 2 per symmetric except ankle jerks are depressed.  Sensory system exam reveals normal touch, pinprick sensation and position and vibration.  Gait is steady including tandem walking. Plantars are downgoing    ASSESSMENT AND PLAN 67 her lady with chronic headaches likely transformed migraine headaches with analgesic rebound who has failed trials of multiple medications in the past.  Description of headache has changed and are concerning for trigeminal neuralgia which has shown some response to gabapentin  PLAN: Increase gabapentin to 600 mg 3 times  daily gradually over the next 3 weeks. Return for followup in 2 months with Lynn,NP or. call earlier if necessary   Meds ordered this encounter  Medications  . gabapentin (NEURONTIN) 600 MG tablet    Sig: Take 1 tablet (600 mg total) by mouth 3 (three) times daily.    Dispense:  90 tablet    Refill:  3    No orders of the defined types were placed in this encounter.   Delia Heady, MD  06/17/2013, 5:52 PM [facility] [address], [address] [place], [place] [address] [phone]

## 2013-06-17 NOTE — Patient Instructions (Addendum)
Increase gabapentin to 600 mg 3 times daily gradually over the next 3 weeks. Return for followup in 2 months with Lynn,NP or. call earlier if necessary

## 2013-09-04 ENCOUNTER — Ambulatory Visit: Payer: Medicare Other | Admitting: Nurse Practitioner

## 2013-09-07 ENCOUNTER — Encounter: Payer: Self-pay | Admitting: Nurse Practitioner

## 2013-09-07 ENCOUNTER — Telehealth: Payer: Self-pay | Admitting: Nurse Practitioner

## 2013-09-07 NOTE — Telephone Encounter (Signed)
Called and number unable to receive calls. Mailed letter of appt change

## 2013-09-15 ENCOUNTER — Telehealth: Payer: Self-pay

## 2013-09-15 NOTE — Telephone Encounter (Signed)
I called patient to see if she would like to come in for an appointment earlier that has just opened up for this Thursday. She stated that she can not get a ride in short notice. She will keep her appointment for April. She does want MD/NP to know that she is having significant pain, the medication is not helping, she can not sleep at night.

## 2013-11-04 ENCOUNTER — Other Ambulatory Visit: Payer: Self-pay | Admitting: Neurology

## 2013-11-24 ENCOUNTER — Ambulatory Visit: Payer: Medicare Other | Admitting: Nurse Practitioner

## 2013-11-26 ENCOUNTER — Encounter (INDEPENDENT_AMBULATORY_CARE_PROVIDER_SITE_OTHER): Payer: Self-pay

## 2013-11-26 ENCOUNTER — Ambulatory Visit (INDEPENDENT_AMBULATORY_CARE_PROVIDER_SITE_OTHER): Payer: Medicare Other | Admitting: Nurse Practitioner

## 2013-11-26 ENCOUNTER — Encounter: Payer: Self-pay | Admitting: Nurse Practitioner

## 2013-11-26 VITALS — BP 143/84 | HR 88

## 2013-11-26 DIAGNOSIS — G8929 Other chronic pain: Secondary | ICD-10-CM | POA: Insufficient documentation

## 2013-11-26 DIAGNOSIS — M549 Dorsalgia, unspecified: Secondary | ICD-10-CM

## 2013-11-26 DIAGNOSIS — G500 Trigeminal neuralgia: Secondary | ICD-10-CM | POA: Diagnosis not present

## 2013-11-26 DIAGNOSIS — I2510 Atherosclerotic heart disease of native coronary artery without angina pectoris: Secondary | ICD-10-CM | POA: Diagnosis not present

## 2013-11-26 MED ORDER — TOPIRAMATE 50 MG PO TABS: 50.0000 mg | ORAL_TABLET | Freq: Two times a day (BID) | ORAL | Status: DC

## 2013-11-26 MED ORDER — CARBAMAZEPINE 200 MG PO TABS: 200.0000 mg | ORAL_TABLET | Freq: Two times a day (BID) | ORAL | Status: DC

## 2013-11-26 MED ORDER — ACETAMINOPHEN-CODEINE 300-60 MG PO TABS: 1.0000 | ORAL_TABLET | Freq: Four times a day (QID) | ORAL | Status: DC | PRN

## 2013-11-26 NOTE — Patient Instructions (Signed)
Start Cabamezepine 200 mg twice daily for Head pain.  I am giving you a prescription for Tylenol with codiene for pain. Take 1 tablet every 6 hours for pain.  I am referring you to a pain clinic for pain medicine for your back.  Someone will call you with an appointment.  Follow up in 3 months, sooner as needed.   Trigeminal Neuralgia Trigeminal neuralgia is a nerve disorder that causes sudden attacks of severe facial pain. It is caused by damage to the trigeminal nerve, a major nerve in the face. It is more common in women and in the elderly, although it can also happen in younger patients. Attacks last from a few seconds to several minutes and can occur from a couple of times per year to several times per day. Trigeminal neuralgia can be a very distressing and disabling condition. Surgery may be needed in very severe cases if medical treatment does not give relief. HOME CARE INSTRUCTIONS   If your caregiver prescribed medication to help prevent attacks, take as directed.  To help prevent attacks:  Chew on the unaffected side of the mouth.  Avoid touching your face.  Avoid blasts of hot or cold air.  Men may wish to grow a beard to avoid having to shave. SEEK IMMEDIATE MEDICAL CARE IF:  Pain is unbearable and your medicine does not help.  You develop new, unexplained symptoms (problems).  You have problems that may be related to a medication you are taking. Document Released: 08/10/2000 Document Revised: 11/05/2011 Document Reviewed: 06/10/2009 [facility] Patient Information 2014 [place], [place].

## 2013-11-26 NOTE — Progress Notes (Signed)
PATIENT: Janice Morrow DOB: 1945/12/03  REASON FOR VISIT: follow up for Trigeminal Neuralgia. HISTORY FROM: patient  HISTORY OF PRESENT ILLNESS: PRIOR HPI 11/11/12 (PS): 68 year old lady with chronic headaches likely transform migraines with and algesic rebound. She has failed trials of tramadol, trexomet,tylenol,, goody powder, Percocet, Topamax, Botox, Depakote and Zanaflex in the past. She has underlying sleep apnea and musculoskeletal neck and shoulder pain as contributing triggers for her headache.   She returns today for followup after last visit on 06/30/12 requesting urgent appointment because of worsening headaches. She states for the last couple of months headaches have gotten worse and have now become daily. She has been taking migraine Excedrin on a daily basis for couple of weeks without much relief and has now switched to taking Goody powders with similar results. She did obtain relief with Zanaflex which had prescribed in the past but her insurance company is refusing to cover it for headache. She has not been doing regular neck stretching exercises of participating in any stress relaxation activities.   UPDATE 05/04/13 (LL): Patient returns for follow up for headaches. She states that the Depakote given at last visit did not provide any relief. The pain is right-sided, is a sharp shooting pain that starts in the right temporal region that comes around the face to the cheek and mandibular area. She can feel pulsating pain in her temple and the pain is stabbing in quality. She has pain in the jaw which makes it difficult to eat. She also has intermittent numbness under and behind her right ear. She had returned to [place] for a while but states she has not taken any in 2 weeks. Denies nausea, photophobia, or phonophobia.   Update 06/17/13 (PS): She returns for followup after last visit on 05/04/13 with Janice Morrow, nurse practitioner. She states improvement in her headaches after  starting gabapentin which she takes 300 mg 3 times daily. She has had these headaches only twice in October so far and twice in last month as well. She has discontinued Zanaflex and does not take it anymore. She did have lab work on 05/04/13 which included ESR, CBC and BMP which were normal. She states she's lost 15 pounds and seemed surprised and happy about it. She has no new complaints today.   Update 11/26/13 (LL): She returns for followup after last visit on 05/04/13 with Dr. Leonie Morrow.  She states that the gabapentin is not helping her headaches anymore, but she continues to take it.  She is still having about 4 or 5 days where she has headaches a week, EEG episode lasting 30-45 minutes of stabbing pain. She also complains today of severe low back pain that is chronic in nature due to disc herniation in the lumbar spine area.  She reports taking an average of 2 goody powders per day either for headache or for back pain.  Review of Systems  A 14 system review of systems is Positive for headache, back pain, walking difficulty only   ALLERGIES: No Known Allergies  HOME MEDICATIONS: Outpatient Prescriptions Prior to Visit  Medication Sig Dispense Refill  . gabapentin (NEURONTIN) 600 MG tablet TAKE 1 TABLET (600 MG TOTAL) BY MOUTH 3 (THREE) TIMES DAILY.  90 tablet  0   PHYSICAL EXAM  Filed Vitals:   11/26/13 1514  BP: 143/84  Pulse: 88   Cannot calculate BMI with a height equal to zero.  Physical Exam  [name] who appears not to be in  distress. Head is non traumatic. Neck is supple. There is mild spasm of the posterior neck and upper scapular muscles which appear tight. Cardiac exam with systolic murmur. Lungs are clear to auscultation. Skin shows no rash. There is no pedal edema. Hearing appears normal   Neurologic Exam  Awake alert oriented x3 with normal speech and language function. Intact attention, registration and recall.  Cranial nerve exam reveals full  range of eye movements without nystagmus. Pupils are equal reactive. Face is symmetric without weakness. Palatal moments normal. Tongue is midline.  Motor system exam reveals symmetric upper and lower extremity strength without any drift or focal weakness. Tone is normal.  Deep tendon reflexes are 2 per symmetric except ankle jerks are depressed.  Sensory system exam reveals normal touch, pinprick sensation and position and vibration.  Gait is not tested today, she is in wheelchair.  ASSESSMENT AND PLAN 68 her lady with chronic headaches likely transformed migraine headaches with analgesic rebound who has failed trials of multiple medications in the past. Description of headache has changed and are concerning for trigeminal neuralgia which has shown some response to gabapentin.   PLAN: Continue gabapentin 600 mg 3 times daily.  Start Carbamezepine 200 mg BID for Trigeminal Neuralgia. Referral to pain clinic for chronic low back pain, needs better pain control, abusing Goody powders currently. 1 time Rx for Tylenol with Codeine for pain, until she can be seen by pain clinic. Return for followup in 3 months with Janice Hawking, NP or call earlier if necessary.  Orders Placed This Encounter  Procedures  . Ambulatory referral to Pain Clinic   Meds ordered this encounter  Medications  . carbamazepine (TEGRETOL) 200 MG tablet    Sig: Take 1 tablet (200 mg total) by mouth 2 (two) times daily.    Dispense:  60 tablet    Refill:  5    Order Specific Question:  Supervising Provider    Answer:  Janice Morrow, PRAMOD S [[id number]]  . acetaminophen-codeine (TYLENOL/CODEINE #4) 300-60 MG per tablet    Sig: Take 1 tablet by mouth every 6 (six) hours as needed for pain.    Dispense:  60 tablet    Refill:  0    Order Specific Question:  Supervising Provider    Answer:  Garvin Fila [[id number]]   Return in about 3 months (around 02/25/2014).  Janice Pali, MSN, NP-C 11/26/2013, 4:17 PM [facility] [address], [place], [place] [address] [phone]  Note: This document was prepared with digital dictation and possible smart phrase technology. Any transcriptional errors that result from this process are unintentional.

## 2013-12-21 DIAGNOSIS — G894 Chronic pain syndrome: Secondary | ICD-10-CM | POA: Diagnosis not present

## 2013-12-21 DIAGNOSIS — M47817 Spondylosis without myelopathy or radiculopathy, lumbosacral region: Secondary | ICD-10-CM | POA: Diagnosis not present

## 2013-12-21 DIAGNOSIS — M5137 Other intervertebral disc degeneration, lumbosacral region: Secondary | ICD-10-CM | POA: Diagnosis not present

## 2013-12-21 DIAGNOSIS — Z79899 Other long term (current) drug therapy: Secondary | ICD-10-CM | POA: Diagnosis not present

## 2013-12-21 DIAGNOSIS — IMO0001 Reserved for inherently not codable concepts without codable children: Secondary | ICD-10-CM | POA: Diagnosis not present

## 2013-12-21 DIAGNOSIS — M25569 Pain in unspecified knee: Secondary | ICD-10-CM | POA: Diagnosis not present

## 2014-01-11 ENCOUNTER — Other Ambulatory Visit: Payer: Self-pay | Admitting: Physician Assistant

## 2014-01-11 DIAGNOSIS — M545 Low back pain, unspecified: Secondary | ICD-10-CM

## 2014-01-15 ENCOUNTER — Ambulatory Visit
Admission: RE | Admit: 2014-01-15 | Discharge: 2014-01-15 | Disposition: A | Discharge status: Home / Self Care | Payer: Medicare Other | Source: Ambulatory Visit | Attending: Physician Assistant | Admitting: Physician Assistant

## 2014-01-15 DIAGNOSIS — M47817 Spondylosis without myelopathy or radiculopathy, lumbosacral region: Secondary | ICD-10-CM | POA: Diagnosis not present

## 2014-01-15 DIAGNOSIS — M48061 Spinal stenosis, lumbar region without neurogenic claudication: Secondary | ICD-10-CM | POA: Diagnosis not present

## 2014-01-15 DIAGNOSIS — M545 Low back pain, unspecified: Secondary | ICD-10-CM

## 2014-01-19 DIAGNOSIS — M5137 Other intervertebral disc degeneration, lumbosacral region: Secondary | ICD-10-CM | POA: Diagnosis not present

## 2014-01-19 DIAGNOSIS — G894 Chronic pain syndrome: Secondary | ICD-10-CM | POA: Diagnosis not present

## 2014-01-19 DIAGNOSIS — IMO0001 Reserved for inherently not codable concepts without codable children: Secondary | ICD-10-CM | POA: Diagnosis not present

## 2014-01-19 DIAGNOSIS — Z79899 Other long term (current) drug therapy: Secondary | ICD-10-CM | POA: Diagnosis not present

## 2014-02-23 DIAGNOSIS — G894 Chronic pain syndrome: Secondary | ICD-10-CM | POA: Diagnosis not present

## 2014-02-23 DIAGNOSIS — M1990 Unspecified osteoarthritis, unspecified site: Secondary | ICD-10-CM | POA: Diagnosis not present

## 2014-02-23 DIAGNOSIS — M47817 Spondylosis without myelopathy or radiculopathy, lumbosacral region: Secondary | ICD-10-CM | POA: Diagnosis not present

## 2014-02-23 DIAGNOSIS — M5137 Other intervertebral disc degeneration, lumbosacral region: Secondary | ICD-10-CM | POA: Diagnosis not present

## 2014-02-23 DIAGNOSIS — Z79899 Other long term (current) drug therapy: Secondary | ICD-10-CM | POA: Diagnosis not present

## 2014-03-03 ENCOUNTER — Encounter (INDEPENDENT_AMBULATORY_CARE_PROVIDER_SITE_OTHER): Payer: Self-pay

## 2014-03-03 ENCOUNTER — Ambulatory Visit (INDEPENDENT_AMBULATORY_CARE_PROVIDER_SITE_OTHER): Payer: Medicare Other | Admitting: Nurse Practitioner

## 2014-03-03 ENCOUNTER — Encounter: Payer: Self-pay | Admitting: Nurse Practitioner

## 2014-03-03 VITALS — BP 143/85 | HR 103 | Ht 65.5 in | Wt 362.2 lb

## 2014-03-03 DIAGNOSIS — G500 Trigeminal neuralgia: Secondary | ICD-10-CM | POA: Diagnosis not present

## 2014-03-03 MED ORDER — CARBAMAZEPINE 200 MG PO TABS: 400.0000 mg | ORAL_TABLET | Freq: Two times a day (BID) | ORAL | Status: DC

## 2014-03-03 MED ORDER — CARBAMAZEPINE 200 MG PO TABS: 200.0000 mg | ORAL_TABLET | Freq: Four times a day (QID) | ORAL | Status: DC

## 2014-03-03 NOTE — Progress Notes (Signed)
PATIENT: Janice Morrow DOB: Mar 18, 1946  REASON FOR VISIT: routine follow up for trigeminal neuralgia HISTORY FROM: patient  HISTORY OF PRESENT ILLNESS: PRIOR HPI 11/11/12 (PS): 68 year old lady with chronic headaches likely transform migraines with and algesic rebound. She has failed trials of tramadol, trexomet,tylenol,, goody powder, Percocet, Topamax, Botox, Depakote and Zanaflex in the past. She has underlying sleep apnea and musculoskeletal neck and shoulder pain as contributing triggers for her headache.  She returns today for followup after last visit on 06/30/12 requesting urgent appointment because of worsening headaches. She states for the last couple of months headaches have gotten worse and have now become daily. She has been taking migraine Excedrin on a daily basis for couple of weeks without much relief and has now switched to taking Goody powders with similar results. She did obtain relief with Zanaflex which had prescribed in the past but her insurance company is refusing to cover it for headache. She has not been doing regular neck stretching exercises of participating in any stress relaxation activities.  UPDATE 05/04/13 (LL): Patient returns for follow up for headaches. She states that the Depakote given at last visit did not provide any relief. The pain is right-sided, is a sharp shooting pain that starts in the right temporal region that comes around the face to the cheek and mandibular area. She can feel pulsating pain in her temple and the pain is stabbing in quality. She has pain in the jaw which makes it difficult to eat. She also has intermittent numbness under and behind her right ear. She had returned to [place] for a while but states she has not taken any in 2 weeks. Denies nausea, photophobia, or phonophobia.  Update 06/17/13 (PS): She returns for followup after last visit on 05/04/13 with Jeani Hawking, nurse practitioner. She states improvement in her headaches after  starting gabapentin which she takes 300 mg 3 times daily. She has had these headaches only twice in October so far and twice in last month as well. She has discontinued Zanaflex and does not take it anymore. She did have lab work on 05/04/13 which included ESR, CBC and BMP which were normal. She states she's lost 15 pounds and seemed surprised and happy about it. She has no new complaints today.  Update 11/26/13 (LL): She returns for followup after last visit on 05/04/13 with Dr. Leonie Man. She states that the gabapentin is not helping her headaches anymore, but she continues to take it. She is still having about 4 or 5 days where she has headaches a week, episode lasting 30-45 minutes of stabbing pain. She also complains today of severe low back pain that is chronic in nature due to disc herniation in the lumbar spine area. She reports taking an average of 2 goody powders per day either for headache or for back pain.   Update 03/03/14 (LL): She returns for followup after last visit on 11/26/13 with me. Since last visit, she has been to a pain clinic for treatment of her lower back pain but she cannot tell me which one.  MRI lumbar spine was done which showed Moderate spinal stenosis at L3-4, Mild spinal stenosis L4-5, and Spondylosis and foraminal narrowing bilaterally L5-S1.  Her facial pain is still not under good control, and she has been taking 3 tablets of Carbamazepine on some days.    Review of Systems  A 14 system review of systems is Positive for back pain, joint pain, restless legs, chills and walking difficulty only  ALLERGIES: No Known Allergies  HOME MEDICATIONS: Outpatient Prescriptions Prior to Visit  Medication Sig Dispense Refill  . carbamazepine (TEGRETOL) 200 MG tablet Take 1 tablet (200 mg total) by mouth 2 (two) times daily.  60 tablet  5  . acetaminophen-codeine (TYLENOL/CODEINE #4) 300-60 MG per tablet Take 1 tablet by mouth every 6 (six) hours as needed for pain.  60 tablet  0  .  gabapentin (NEURONTIN) 600 MG tablet TAKE 1 TABLET (600 MG TOTAL) BY MOUTH 3 (THREE) TIMES DAILY.  90 tablet  0   No facility-administered medications prior to visit.    PHYSICAL EXAM Filed Vitals:   03/03/14 1419  BP: 143/85  Pulse: 103  Height: 5' 5.5" (1.664 m)  Weight: 362 lb 3.2 oz (164.293 kg)   Body mass index is 59.34 kg/(m^2). No exam data present No flowsheet data found.  No flowsheet data found.   Physical Exam  [name] who appears not to be in distress. Head is non traumatic. Neck is supple. There is mild spasm of the posterior neck and upper scapular muscles which appear tight. Cardiac exam with systolic murmur. Lungs are clear to auscultation. Skin shows no rash. Hearing appears normal   Neurologic Exam  Awake alert oriented x3 with normal speech and language function. Intact attention, registration and recall.  Cranial nerve exam reveals full range of eye movements without nystagmus. Pupils are equal reactive. Face is symmetric without weakness. Palatal moments normal. Tongue is midline.  Motor system exam reveals symmetric upper and lower extremity strength without any drift or focal weakness. Tone is normal.  Deep tendon reflexes are 2 per symmetric except ankle jerks are depressed.  Sensory system exam reveals normal touch, pinprick sensation and position and vibration.  Gait is not tested today, she is in wheelchair.   ASSESSMENT: 68 year-old lady with chronic headaches likely transformed migraine headaches with analgesic rebound who has failed trials of multiple medications in the past. Description of headache has changed and are concerning for trigeminal neuralgia which has shown some response to Carbamazepine.   PLAN: Increase Carbamezepine 200 mg 2 times a day for Trigeminal Neuralgia. Take 2 tablets in the morning, and take 2 tablets at bedtime.Continue pain management at the pain clinic. Follow up in 6 months, sooner as  needed.  Meds ordered this encounter  Medications  . carbamazepine (TEGRETOL) 200 MG tablet    Sig: Take 2 tablets (400 mg total) by mouth 2 (two) times daily.    Dispense:  120 tablet    Refill:  5    Order Specific Question:  Supervising Provider    Answer:  Antony Contras [[id number]]   Return in about 6 months (around 09/03/2014) for trigeminal neuralgia.  Philmore Pali, MSN, NP-C 03/03/2014, 3:18 PM [facility] [address] [place], [place] [address] [phone]  Note: This document was prepared with digital dictation and possible smart phrase technology. Any transcriptional errors that result from this process are unintentional.

## 2014-03-03 NOTE — Patient Instructions (Addendum)
Increase Carbamezepine 200 mg to 2 times a day for Trigeminal Neuralgia.  Take 2 tablets in the morning, and take 2 tablets at bedtime. Continue pain management at the pain clinic. Follow up in 6 months, sooner as needed.  Trigeminal Neuralgia Trigeminal neuralgia is a nerve disorder that causes sudden attacks of severe facial pain. It is caused by damage to the trigeminal nerve, a major nerve in the face. It is more common in women and in the elderly, although it can also happen in younger patients. Attacks last from a few seconds to several minutes and can occur from a couple of times per year to several times per day. Trigeminal neuralgia can be a very distressing and disabling condition. Surgery may be needed in very severe cases if medical treatment does not give relief. HOME CARE INSTRUCTIONS   If your caregiver prescribed medication to help prevent attacks, take as directed.  To help prevent attacks:  Chew on the unaffected side of the mouth.  Avoid touching your face.  Avoid blasts of hot or cold air.  Men may wish to grow a beard to avoid having to shave. SEEK IMMEDIATE MEDICAL CARE IF:  Pain is unbearable and your medicine does not help.  You develop new, unexplained symptoms (problems).  You have problems that may be related to a medication you are taking. Document Released: 08/10/2000 Document Revised: 11/05/2011 Document Reviewed: 06/10/2009 [facility] Patient Information 2015 [place], [place]. This information is not intended to replace advice given to you by your health care provider. Make sure you discuss any questions you have with your health care provider.

## 2014-03-30 DIAGNOSIS — G894 Chronic pain syndrome: Secondary | ICD-10-CM | POA: Diagnosis not present

## 2014-03-30 DIAGNOSIS — Z79899 Other long term (current) drug therapy: Secondary | ICD-10-CM | POA: Diagnosis not present

## 2014-03-30 DIAGNOSIS — M5137 Other intervertebral disc degeneration, lumbosacral region: Secondary | ICD-10-CM | POA: Diagnosis not present

## 2014-03-30 DIAGNOSIS — M1710 Unilateral primary osteoarthritis, unspecified knee: Secondary | ICD-10-CM | POA: Diagnosis not present

## 2014-03-30 DIAGNOSIS — M47817 Spondylosis without myelopathy or radiculopathy, lumbosacral region: Secondary | ICD-10-CM | POA: Diagnosis not present

## 2014-04-27 ENCOUNTER — Encounter (HOSPITAL_COMMUNITY): Payer: Self-pay | Admitting: Radiology

## 2014-04-27 ENCOUNTER — Emergency Department (HOSPITAL_COMMUNITY): Payer: Medicare Other

## 2014-04-27 ENCOUNTER — Emergency Department (HOSPITAL_COMMUNITY)
Admission: EM | Admit: 2014-04-27 | Discharge: 2014-04-27 | Disposition: A | Discharge status: Home / Self Care | Payer: Medicare Other | Attending: Emergency Medicine | Admitting: Emergency Medicine

## 2014-04-27 DIAGNOSIS — M1990 Unspecified osteoarthritis, unspecified site: Secondary | ICD-10-CM | POA: Insufficient documentation

## 2014-04-27 DIAGNOSIS — Z8639 Personal history of other endocrine, nutritional and metabolic disease: Secondary | ICD-10-CM | POA: Diagnosis not present

## 2014-04-27 DIAGNOSIS — R519 Headache, unspecified: Secondary | ICD-10-CM

## 2014-04-27 DIAGNOSIS — R404 Transient alteration of awareness: Secondary | ICD-10-CM | POA: Diagnosis not present

## 2014-04-27 DIAGNOSIS — J449 Chronic obstructive pulmonary disease, unspecified: Secondary | ICD-10-CM | POA: Diagnosis not present

## 2014-04-27 DIAGNOSIS — R9431 Abnormal electrocardiogram [ECG] [EKG]: Secondary | ICD-10-CM | POA: Diagnosis not present

## 2014-04-27 DIAGNOSIS — Z79899 Other long term (current) drug therapy: Secondary | ICD-10-CM | POA: Insufficient documentation

## 2014-04-27 DIAGNOSIS — R5381 Other malaise: Secondary | ICD-10-CM | POA: Diagnosis not present

## 2014-04-27 DIAGNOSIS — Z8679 Personal history of other diseases of the circulatory system: Secondary | ICD-10-CM | POA: Diagnosis not present

## 2014-04-27 DIAGNOSIS — R5383 Other fatigue: Secondary | ICD-10-CM | POA: Diagnosis not present

## 2014-04-27 DIAGNOSIS — M47817 Spondylosis without myelopathy or radiculopathy, lumbosacral region: Secondary | ICD-10-CM | POA: Diagnosis not present

## 2014-04-27 DIAGNOSIS — H538 Other visual disturbances: Secondary | ICD-10-CM | POA: Diagnosis not present

## 2014-04-27 DIAGNOSIS — Z87891 Personal history of nicotine dependence: Secondary | ICD-10-CM | POA: Diagnosis not present

## 2014-04-27 DIAGNOSIS — M5137 Other intervertebral disc degeneration, lumbosacral region: Secondary | ICD-10-CM | POA: Diagnosis not present

## 2014-04-27 DIAGNOSIS — R011 Cardiac murmur, unspecified: Secondary | ICD-10-CM | POA: Diagnosis not present

## 2014-04-27 DIAGNOSIS — J4489 Other specified chronic obstructive pulmonary disease: Secondary | ICD-10-CM | POA: Insufficient documentation

## 2014-04-27 DIAGNOSIS — Z8719 Personal history of other diseases of the digestive system: Secondary | ICD-10-CM | POA: Diagnosis not present

## 2014-04-27 DIAGNOSIS — R51 Headache: Secondary | ICD-10-CM | POA: Insufficient documentation

## 2014-04-27 DIAGNOSIS — M25569 Pain in unspecified knee: Secondary | ICD-10-CM | POA: Diagnosis not present

## 2014-04-27 DIAGNOSIS — G894 Chronic pain syndrome: Secondary | ICD-10-CM | POA: Diagnosis not present

## 2014-04-27 DIAGNOSIS — Z862 Personal history of diseases of the blood and blood-forming organs and certain disorders involving the immune mechanism: Secondary | ICD-10-CM | POA: Diagnosis not present

## 2014-04-27 MED ORDER — PROCHLORPERAZINE EDISYLATE 5 MG/ML IJ SOLN
10.0000 mg | Freq: Four times a day (QID) | INTRAMUSCULAR | Status: DC | PRN
  Administered 2014-04-27: 10 mg via INTRAVENOUS
  Filled 2014-04-27: qty 2

## 2014-04-27 MED ORDER — DIPHENHYDRAMINE HCL 50 MG/ML IJ SOLN
25.0000 mg | Freq: Once | INTRAMUSCULAR | Status: AC
  Administered 2014-04-27: 25 mg via INTRAVENOUS
  Filled 2014-04-27: qty 1

## 2014-04-27 MED ORDER — KETOROLAC TROMETHAMINE 15 MG/ML IJ SOLN
15.0000 mg | Freq: Once | INTRAMUSCULAR | Status: AC
  Administered 2014-04-27: 15 mg via INTRAVENOUS
  Filled 2014-04-27: qty 1

## 2014-04-27 MED ORDER — SODIUM CHLORIDE 0.9 % IV SOLN
INTRAVENOUS | Status: DC
  Administered 2014-04-27: 18:00:00 via INTRAVENOUS

## 2014-04-27 NOTE — ED Notes (Signed)
EMS brought patient in from doctors office. Per EMS, patient is having visual disturbances that have come and gone since 0930 today. Pt is having a headache on her right side.  CBG from EMS 121.  Pt states, "it is like a tinted window is coming and going in front of my eyes."

## 2014-04-27 NOTE — Discharge Instructions (Signed)

## 2014-04-30 NOTE — ED Provider Notes (Signed)
CSN: [id number]     Arrival date & time 04/27/14  1654 History   First MD Initiated Contact with Patient 04/27/14 1654     Chief Complaint  Patient presents with  . Visual Field Change  . Headache     (Consider location/radiation/quality/duration/timing/severity/associated sxs/prior Treatment) HPI  68 year old female with headache. Gradual onset about a day ago. Pain is worse than right temporal region. Throbbing in nature. Constant since onset. No appreciable exacerbating relieving factors. No fevers or chills. No neck pain or stiffness. Some mild intermittent blurred vision. No photophobia. No acute numbness, tingling or loss of strength. No blood thinners. Denies any trauma.    Past Medical History  Diagnosis Date  . Heart murmur   . Headache(784.0)   . COPD (chronic obstructive pulmonary disease)   . GERD (gastroesophageal reflux disease)   . PVD (peripheral vascular disease)   . Hyperlipidemia   . Cervicalgia   . Migraine without aura, with intractable migraine, so stated, without mention of status migrainosus   . Migraine without aura, with intractable migraine, so stated, without mention of status migrainosus   . PVD (peripheral vascular disease)   . Osteoarthritis    Past Surgical History  Procedure Laterality Date  . Dental surgery     Family History  Problem Relation Age of Onset  . Heart attack Father   . Heart disease    . Diabetes     History  Substance Use Topics  . Smoking status: Former Smoker    Types: Cigarettes    Quit date: 08/22/2010  . Smokeless tobacco: Never Used  . Alcohol Use: No   OB History   Grav Para Term Preterm Abortions TAB SAB Ect Mult Living                 Review of Systems   All systems reviewed and negative, other than as noted in HPI.   Allergies  Review of patient's allergies indicates no known allergies.  Home Medications   Prior to Admission medications   Medication Sig Start Date End Date Taking? Authorizing  Provider  HYDROcodone-acetaminophen (NORCO) 10-325 MG per tablet Take 1 tablet by mouth every 8 (eight) hours.  02/26/14  Yes Historical Provider, MD   BP 141/66  Pulse 71  Temp(Src) 98.5 F (36.9 C) (Oral)  Resp 16  Ht 5\' 5"  (1.651 m)  Wt 352 lb (159.666 kg)  BMI 58.58 kg/m2  SpO2 96% Physical Exam  Nursing note and vitals reviewed. Constitutional: She is oriented to person, place, and time. She appears well-developed and well-nourished. No distress.  HENT:  Head: Normocephalic and atraumatic.  Eyes: Conjunctivae are normal. Right eye exhibits no discharge. Left eye exhibits no discharge.  Neck: Normal range of motion. Neck supple.  Cardiovascular: Normal rate, regular rhythm and normal heart sounds.  Exam reveals no gallop and no friction rub.   No murmur heard. Pulmonary/Chest: Effort normal and breath sounds normal. No respiratory distress.  Abdominal: Soft. She exhibits no distension. There is no tenderness.  Musculoskeletal: She exhibits no edema and no tenderness.  Neurological: She is alert and oriented to person, place, and time. No cranial nerve deficit. She exhibits normal muscle tone. Coordination normal.  Speech clear. Content appropriate.  Good finger to nose testing bilaterally. Gait is steady.  Skin: Skin is warm and dry.  Psychiatric: She has a normal mood and affect. Her behavior is normal. Thought content normal.    ED Course  Procedures (including critical care time) Labs Review  Labs Reviewed - No data to display  Imaging Review No results found.   EKG Interpretation   Date/Time:  Tuesday April 27 2014 16:55:57 EDT Ventricular Rate:  79 PR Interval:  232 QRS Duration: 100 QT Interval:  403 QTC Calculation: 462 R Axis:   -21 Text Interpretation:  Sinus rhythm Prolonged PR interval Probable left  atrial enlargement Borderline left axis deviation RSR' in V1 or V2,  probably normal variant Nonspecific T abnrm, anterolateral leads ED  PHYSICIAN  INTERPRETATION AVAILABLE IN [facility] Confirmed by TEST,  Record ([id number]) on 04/29/2014 7:07:59 AM      MDM   Final diagnoses:  Nonintractable headache, unspecified chronicity pattern, unspecified headache type   67yF with HA. Suspect primary HA. Consider emergent secondary causes such as bleed, infectious or mass but doubt. There is no history of trauma. Pt has a nonfocal neurological exam. Afebrile and neck supple. No use of blood thinning medication. Consider ocular etiology such as acute angle closure glaucoma but doubt. Pt denies acute change in visual acuity and eye exam unremarkable. Doubt temporal arteritis. No temporal tenderness and temporal artery pulsations palpable. Doubt CO poisoning. No contacts with similar symptoms. Doubt venous thrombosis. Doubt carotid or vertebral arteries dissection. Symptoms improved with meds. Feel that can be safely discharged, but strict return precautions discussed. Outpt fu.     Virgel Manifold, MD 04/30/14 [phone]

## 2014-05-25 DIAGNOSIS — M5137 Other intervertebral disc degeneration, lumbosacral region: Secondary | ICD-10-CM | POA: Diagnosis not present

## 2014-05-25 DIAGNOSIS — G894 Chronic pain syndrome: Secondary | ICD-10-CM | POA: Diagnosis not present

## 2014-05-25 DIAGNOSIS — M47817 Spondylosis without myelopathy or radiculopathy, lumbosacral region: Secondary | ICD-10-CM | POA: Diagnosis not present

## 2014-05-25 DIAGNOSIS — Z79899 Other long term (current) drug therapy: Secondary | ICD-10-CM | POA: Diagnosis not present

## 2014-06-22 DIAGNOSIS — G894 Chronic pain syndrome: Secondary | ICD-10-CM | POA: Diagnosis not present

## 2014-06-22 DIAGNOSIS — M5408 Panniculitis affecting regions of neck and back, sacral and sacrococcygeal region: Secondary | ICD-10-CM | POA: Diagnosis not present

## 2014-06-22 DIAGNOSIS — Z79891 Long term (current) use of opiate analgesic: Secondary | ICD-10-CM | POA: Diagnosis not present

## 2014-06-22 DIAGNOSIS — Z79899 Other long term (current) drug therapy: Secondary | ICD-10-CM | POA: Diagnosis not present

## 2014-06-22 DIAGNOSIS — M5137 Other intervertebral disc degeneration, lumbosacral region: Secondary | ICD-10-CM | POA: Diagnosis not present

## 2014-06-22 DIAGNOSIS — M791 Myalgia: Secondary | ICD-10-CM | POA: Diagnosis not present

## 2014-06-22 DIAGNOSIS — G5790 Unspecified mononeuropathy of unspecified lower limb: Secondary | ICD-10-CM | POA: Diagnosis not present

## 2014-06-22 DIAGNOSIS — M169 Osteoarthritis of hip, unspecified: Secondary | ICD-10-CM | POA: Diagnosis not present

## 2014-06-22 DIAGNOSIS — M12819 Other specific arthropathies, not elsewhere classified, unspecified shoulder: Secondary | ICD-10-CM | POA: Diagnosis not present

## 2014-06-22 DIAGNOSIS — M47817 Spondylosis without myelopathy or radiculopathy, lumbosacral region: Secondary | ICD-10-CM | POA: Diagnosis not present

## 2014-07-20 DIAGNOSIS — G894 Chronic pain syndrome: Secondary | ICD-10-CM | POA: Diagnosis not present

## 2014-07-20 DIAGNOSIS — M791 Myalgia: Secondary | ICD-10-CM | POA: Diagnosis not present

## 2014-07-20 DIAGNOSIS — E1142 Type 2 diabetes mellitus with diabetic polyneuropathy: Secondary | ICD-10-CM | POA: Diagnosis not present

## 2014-07-20 DIAGNOSIS — G5792 Unspecified mononeuropathy of left lower limb: Secondary | ICD-10-CM | POA: Diagnosis not present

## 2014-07-20 DIAGNOSIS — M1710 Unilateral primary osteoarthritis, unspecified knee: Secondary | ICD-10-CM | POA: Diagnosis not present

## 2014-07-20 DIAGNOSIS — G5791 Unspecified mononeuropathy of right lower limb: Secondary | ICD-10-CM | POA: Diagnosis not present

## 2014-07-20 DIAGNOSIS — M1990 Unspecified osteoarthritis, unspecified site: Secondary | ICD-10-CM | POA: Diagnosis not present

## 2014-07-20 DIAGNOSIS — G44009 Cluster headache syndrome, unspecified, not intractable: Secondary | ICD-10-CM | POA: Diagnosis not present

## 2014-08-17 DIAGNOSIS — M1990 Unspecified osteoarthritis, unspecified site: Secondary | ICD-10-CM | POA: Diagnosis not present

## 2014-08-17 DIAGNOSIS — M791 Myalgia: Secondary | ICD-10-CM | POA: Diagnosis not present

## 2014-08-17 DIAGNOSIS — Z79891 Long term (current) use of opiate analgesic: Secondary | ICD-10-CM | POA: Diagnosis not present

## 2014-08-17 DIAGNOSIS — M5137 Other intervertebral disc degeneration, lumbosacral region: Secondary | ICD-10-CM | POA: Diagnosis not present

## 2014-08-17 DIAGNOSIS — M25569 Pain in unspecified knee: Secondary | ICD-10-CM | POA: Diagnosis not present

## 2014-08-17 DIAGNOSIS — Z79899 Other long term (current) drug therapy: Secondary | ICD-10-CM | POA: Diagnosis not present

## 2014-08-17 DIAGNOSIS — G894 Chronic pain syndrome: Secondary | ICD-10-CM | POA: Diagnosis not present

## 2014-08-17 DIAGNOSIS — M47817 Spondylosis without myelopathy or radiculopathy, lumbosacral region: Secondary | ICD-10-CM | POA: Diagnosis not present

## 2014-08-17 DIAGNOSIS — M169 Osteoarthritis of hip, unspecified: Secondary | ICD-10-CM | POA: Diagnosis not present

## 2014-08-22 ENCOUNTER — Inpatient Hospital Stay (HOSPITAL_COMMUNITY)
Admission: EM | Admit: 2014-08-22 | Discharge: 2014-08-26 | DRG: 177 | Disposition: A | Discharge status: Home / Self Care | Payer: Medicare Other | Attending: Internal Medicine | Admitting: Internal Medicine

## 2014-08-22 ENCOUNTER — Emergency Department (HOSPITAL_COMMUNITY): Payer: Medicare Other

## 2014-08-22 ENCOUNTER — Encounter (HOSPITAL_COMMUNITY): Payer: Self-pay | Admitting: Emergency Medicine

## 2014-08-22 DIAGNOSIS — R404 Transient alteration of awareness: Secondary | ICD-10-CM | POA: Diagnosis not present

## 2014-08-22 DIAGNOSIS — Z6841 Body Mass Index (BMI) 40.0 and over, adult: Secondary | ICD-10-CM | POA: Diagnosis not present

## 2014-08-22 DIAGNOSIS — I739 Peripheral vascular disease, unspecified: Secondary | ICD-10-CM | POA: Diagnosis present

## 2014-08-22 DIAGNOSIS — N390 Urinary tract infection, site not specified: Secondary | ICD-10-CM | POA: Diagnosis present

## 2014-08-22 DIAGNOSIS — G9340 Encephalopathy, unspecified: Secondary | ICD-10-CM | POA: Diagnosis present

## 2014-08-22 DIAGNOSIS — G500 Trigeminal neuralgia: Secondary | ICD-10-CM | POA: Diagnosis not present

## 2014-08-22 DIAGNOSIS — J449 Chronic obstructive pulmonary disease, unspecified: Secondary | ICD-10-CM | POA: Diagnosis present

## 2014-08-22 DIAGNOSIS — M1990 Unspecified osteoarthritis, unspecified site: Secondary | ICD-10-CM | POA: Diagnosis present

## 2014-08-22 DIAGNOSIS — A481 Legionnaires' disease: Principal | ICD-10-CM | POA: Diagnosis present

## 2014-08-22 DIAGNOSIS — R32 Unspecified urinary incontinence: Secondary | ICD-10-CM | POA: Diagnosis not present

## 2014-08-22 DIAGNOSIS — R4182 Altered mental status, unspecified: Secondary | ICD-10-CM

## 2014-08-22 DIAGNOSIS — J189 Pneumonia, unspecified organism: Secondary | ICD-10-CM | POA: Diagnosis not present

## 2014-08-22 DIAGNOSIS — R410 Disorientation, unspecified: Secondary | ICD-10-CM

## 2014-08-22 DIAGNOSIS — E785 Hyperlipidemia, unspecified: Secondary | ICD-10-CM | POA: Diagnosis present

## 2014-08-22 DIAGNOSIS — G43019 Migraine without aura, intractable, without status migrainosus: Secondary | ICD-10-CM | POA: Diagnosis present

## 2014-08-22 DIAGNOSIS — R531 Weakness: Secondary | ICD-10-CM | POA: Diagnosis not present

## 2014-08-22 DIAGNOSIS — G8929 Other chronic pain: Secondary | ICD-10-CM | POA: Diagnosis present

## 2014-08-22 DIAGNOSIS — Z87891 Personal history of nicotine dependence: Secondary | ICD-10-CM

## 2014-08-22 DIAGNOSIS — R918 Other nonspecific abnormal finding of lung field: Secondary | ICD-10-CM | POA: Diagnosis not present

## 2014-08-22 DIAGNOSIS — R0782 Intercostal pain: Secondary | ICD-10-CM | POA: Diagnosis not present

## 2014-08-22 DIAGNOSIS — K219 Gastro-esophageal reflux disease without esophagitis: Secondary | ICD-10-CM | POA: Diagnosis present

## 2014-08-22 DIAGNOSIS — I517 Cardiomegaly: Secondary | ICD-10-CM | POA: Diagnosis not present

## 2014-08-22 DIAGNOSIS — R9431 Abnormal electrocardiogram [ECG] [EKG]: Secondary | ICD-10-CM | POA: Diagnosis not present

## 2014-08-22 DIAGNOSIS — G894 Chronic pain syndrome: Secondary | ICD-10-CM | POA: Diagnosis not present

## 2014-08-22 DIAGNOSIS — B9689 Other specified bacterial agents as the cause of diseases classified elsewhere: Secondary | ICD-10-CM | POA: Diagnosis not present

## 2014-08-22 HISTORY — DX: Trigeminal neuralgia: G50.0

## 2014-08-22 HISTORY — DX: Other chronic pain: G89.29

## 2014-08-22 HISTORY — DX: Dorsalgia, unspecified: M54.9

## 2014-08-22 LAB — COMPREHENSIVE METABOLIC PANEL
ALBUMIN: 3.8 g/dL (ref 3.5–5.2)
ALT: 18 U/L (ref 0–35)
AST: 23 U/L (ref 0–37)
Alkaline Phosphatase: 66 U/L (ref 39–117)
Anion gap: 5 (ref 5–15)
BUN: 13 mg/dL (ref 6–23)
CALCIUM: 9.1 mg/dL (ref 8.4–10.5)
CO2: 29 mmol/L (ref 19–32)
Chloride: 107 mEq/L (ref 96–112)
Creatinine, Ser: 0.82 mg/dL (ref 0.50–1.10)
GFR calc non Af Amer: 73 mL/min — ABNORMAL LOW (ref >90)
GFR, EST AFRICAN AMERICAN: 85 mL/min — AB (ref >90)
Glucose, Bld: 136 mg/dL — ABNORMAL HIGH (ref 70–99)
Potassium: 4.3 mmol/L (ref 3.5–5.1)
SODIUM: 141 mmol/L (ref 135–145)
Total Bilirubin: 0.6 mg/dL (ref 0.3–1.2)
Total Protein: 7 g/dL (ref 6.0–8.3)

## 2014-08-22 LAB — PROTIME-INR
INR: 1.12 (ref 0.00–1.49)
PROTHROMBIN TIME: 14.6 s (ref 11.6–15.2)

## 2014-08-22 LAB — URINALYSIS, ROUTINE W REFLEX MICROSCOPIC
Bilirubin Urine: NEGATIVE
Glucose, UA: NEGATIVE mg/dL
Ketones, ur: NEGATIVE mg/dL
NITRITE: POSITIVE — AB
PH: 6 (ref 5.0–8.0)
Protein, ur: NEGATIVE mg/dL
SPECIFIC GRAVITY, URINE: 1.022 (ref 1.005–1.030)
UROBILINOGEN UA: 1 mg/dL (ref 0.0–1.0)

## 2014-08-22 LAB — CBC
HCT: 42.4 % (ref 36.0–46.0)
Hemoglobin: 13.2 g/dL (ref 12.0–15.0)
MCH: 28.3 pg (ref 26.0–34.0)
MCHC: 31.1 g/dL (ref 30.0–36.0)
MCV: 90.8 fL (ref 78.0–100.0)
PLATELETS: 167 10*3/uL (ref 150–400)
RBC: 4.67 MIL/uL (ref 3.87–5.11)
RDW: 14.6 % (ref 11.5–15.5)
WBC: 6.5 10*3/uL (ref 4.0–10.5)

## 2014-08-22 LAB — RAPID URINE DRUG SCREEN, HOSP PERFORMED
Amphetamines: NOT DETECTED
Barbiturates: NOT DETECTED
Benzodiazepines: NOT DETECTED
COCAINE: NOT DETECTED
Opiates: POSITIVE — AB
Tetrahydrocannabinol: NOT DETECTED

## 2014-08-22 LAB — ETHANOL: Alcohol, Ethyl (B): <5 mg/dL (ref 0–9)

## 2014-08-22 LAB — URINE MICROSCOPIC-ADD ON

## 2014-08-22 LAB — CK: Total CK: 164 U/L (ref 7–177)

## 2014-08-22 LAB — ACETAMINOPHEN LEVEL

## 2014-08-22 LAB — SALICYLATE LEVEL: Salicylate Lvl: <4 mg/dL (ref 2.8–20.0)

## 2014-08-22 LAB — CBG MONITORING, ED: Glucose-Capillary: 135 mg/dL — ABNORMAL HIGH (ref 70–99)

## 2014-08-22 LAB — AMMONIA: AMMONIA: 47 umol/L — AB (ref 11–32)

## 2014-08-22 MED ORDER — CEFTRIAXONE SODIUM IN DEXTROSE 20 MG/ML IV SOLN
1.0000 g | INTRAVENOUS | Status: DC
  Administered 2014-08-22 – 2014-08-25 (×4): 1 g via INTRAVENOUS
  Filled 2014-08-22 (×5): qty 50

## 2014-08-22 MED ORDER — CARBAMAZEPINE 200 MG PO TABS
400.0000 mg | ORAL_TABLET | Freq: Two times a day (BID) | ORAL | Status: DC
  Administered 2014-08-22 – 2014-08-26 (×7): 400 mg via ORAL
  Filled 2014-08-22 (×9): qty 2

## 2014-08-22 MED ORDER — ONDANSETRON HCL 4 MG/2ML IJ SOLN
4.0000 mg | Freq: Four times a day (QID) | INTRAMUSCULAR | Status: DC | PRN
  Administered 2014-08-22 – 2014-08-23 (×3): 4 mg via INTRAVENOUS
  Filled 2014-08-22 (×3): qty 2

## 2014-08-22 MED ORDER — LEVOFLOXACIN IN D5W 750 MG/150ML IV SOLN
750.0000 mg | Freq: Once | INTRAVENOUS | Status: AC
  Administered 2014-08-22: 750 mg via INTRAVENOUS
  Filled 2014-08-22: qty 150

## 2014-08-22 MED ORDER — SODIUM CHLORIDE 0.9 % IV SOLN
INTRAVENOUS | Status: DC
  Administered 2014-08-22: via INTRAVENOUS

## 2014-08-22 MED ORDER — HYDROCODONE-ACETAMINOPHEN 10-325 MG PO TABS
1.0000 | ORAL_TABLET | Freq: Four times a day (QID) | ORAL | Status: DC | PRN
  Administered 2014-08-22 – 2014-08-23 (×2): 1 via ORAL
  Filled 2014-08-22 (×3): qty 1

## 2014-08-22 MED ORDER — ENOXAPARIN SODIUM 40 MG/0.4ML ~~LOC~~ SOLN
40.0000 mg | SUBCUTANEOUS | Status: DC
  Administered 2014-08-23 – 2014-08-25 (×3): 40 mg via SUBCUTANEOUS
  Filled 2014-08-22 (×4): qty 0.4

## 2014-08-22 MED ORDER — DEXTROSE 5 % IV SOLN
500.0000 mg | INTRAVENOUS | Status: DC
  Administered 2014-08-23 (×2): 500 mg via INTRAVENOUS
  Filled 2014-08-22 (×2): qty 500

## 2014-08-22 MED ORDER — BACLOFEN 20 MG PO TABS
20.0000 mg | ORAL_TABLET | Freq: Three times a day (TID) | ORAL | Status: DC
  Filled 2014-08-22 (×4): qty 1

## 2014-08-22 NOTE — ED Notes (Signed)
Please call brother freadrick Paczkowski if PT is going to be admitted [phone]

## 2014-08-22 NOTE — ED Notes (Signed)
Pt remains monitored by blood pressure, pulse ox, and 12 lead.  

## 2014-08-22 NOTE — ED Provider Notes (Signed)
CSN: [id number]     Arrival date & time 08/22/14  1521 History   First MD Initiated Contact with Patient 08/22/14 1610     Chief Complaint  Patient presents with  . Altered Mental Status  . Weakness     (Consider location/radiation/quality/duration/timing/severity/associated sxs/prior Treatment) HPI   A LEVEL 5 CAVEAT PERTAINS DUE TO ALTERED MENTAL STATUS Per EMS pt has been confused and weak today.  Per family she started on baclofen yesterday and last night slept on couch and has not moved at all.  She was incontinent of bladder.  Foul smelling urine.    Past Medical History  Diagnosis Date  . Heart murmur   . Headache(784.0)   . COPD (chronic obstructive pulmonary disease)   . GERD (gastroesophageal reflux disease)   . PVD (peripheral vascular disease)   . Hyperlipidemia   . Cervicalgia   . Migraine without aura, with intractable migraine, so stated, without mention of status migrainosus   . PVD (peripheral vascular disease)   . Osteoarthritis   . Chronic back pain   . Trigeminal neuralgia    Past Surgical History  Procedure Laterality Date  . Dental surgery     Family History  Problem Relation Age of Onset  . Heart attack Father   . Heart disease    . Diabetes    . Diabetes Mother   . Diabetes Brother    History  Substance Use Topics  . Smoking status: Former Smoker    Types: Cigarettes    Quit date: 08/22/2010  . Smokeless tobacco: Never Used  . Alcohol Use: No   OB History    No data available     Review of Systems  UNABLE TO OBTAIN ROS DUE TO LEVEL 5 CAVEAT    Allergies  Review of patient's allergies indicates no known allergies.  Home Medications   Prior to Admission medications   Medication Sig Start Date End Date Taking? Authorizing Provider  baclofen (LIORESAL) 20 MG tablet Take 20 mg by mouth 3 (three) times daily.   Yes Historical Provider, MD  carbamazepine (TEGRETOL) 200 MG tablet Take 400 mg by mouth 2 (two) times daily. 07/26/14   Yes Historical Provider, MD  HYDROcodone-acetaminophen (NORCO) 10-325 MG per tablet Take 1 tablet by mouth every 6 (six) hours as needed for moderate pain.   Yes Historical Provider, MD  carbamazepine (TEGRETOL) 200 MG tablet TAKE 2 TABLETS (400 MG TOTAL) BY MOUTH 2 (TWO) TIMES DAILY. Patient not taking: Reported on 08/23/2014 08/23/14   Antony Contras, MD   BP 145/94 mmHg  Pulse 83  Temp(Src) 98.9 F (37.2 C) (Oral)  Resp 18  Ht 5\' 5"  (1.651 m)  Wt 334 lb 3.5 oz (151.6 kg)  BMI 55.62 kg/m2  SpO2 92%  Vitals reviewed Physical Exam  Physical Examination: General appearance - alert, well appearing, and in no distress Mental status - alert, oriented to person, place, and time Eyes - no conjunctival injection, no scleral icterus Mouth - mucous membranes moist, pharynx normal without lesions Chest - clear to auscultation, no wheezes, rales or rhonchi, symmetric air entry Heart - normal rate, regular rhythm, normal S1, S2, no murmurs, rubs, clicks or gallops Abdomen - soft, nontender, nondistended, no masses or organomegaly Neurological - alert, oriented x 1, cranial nerves 2-12 tested and intact, strength 5/5 in extremities x 4, sensation intact Extremities - peripheral pulses normal, no pedal edema, no clubbing or cyanosis Skin - normal coloration and turgor, no rashes  ED Course  Procedures (including critical care time)  7:21 PM continuing to await Head CT to be performed.  Labs Review Labs Reviewed  URINALYSIS, ROUTINE W REFLEX MICROSCOPIC - Abnormal; Notable for the following:    Color, Urine AMBER (*)    APPearance CLOUDY (*)    Hgb urine dipstick MODERATE (*)    Nitrite POSITIVE (*)    Leukocytes, UA LARGE (*)    All other components within normal limits  URINE RAPID DRUG SCREEN (HOSP PERFORMED) - Abnormal; Notable for the following:    Opiates POSITIVE (*)    All other components within normal limits  AMMONIA - Abnormal; Notable for the following:    Ammonia 47 (*)     All other components within normal limits  COMPREHENSIVE METABOLIC PANEL - Abnormal; Notable for the following:    Glucose, Bld 136 (*)    GFR calc non Af Amer 73 (*)    GFR calc Af Amer 85 (*)    All other components within normal limits  ACETAMINOPHEN LEVEL - Abnormal; Notable for the following:    Acetaminophen (Tylenol), Serum <10.0 (*)    All other components within normal limits  URINE MICROSCOPIC-ADD ON - Abnormal; Notable for the following:    Bacteria, UA MANY (*)    All other components within normal limits  COMPREHENSIVE METABOLIC PANEL - Abnormal; Notable for the following:    Glucose, Bld 130 (*)    GFR calc non Af Amer 73 (*)    GFR calc Af Amer 85 (*)    All other components within normal limits  CREATININE, SERUM - Abnormal; Notable for the following:    GFR calc non Af Amer 88 (*)    All other components within normal limits  GLUCOSE, CAPILLARY - Abnormal; Notable for the following:    Glucose-Capillary 140 (*)    All other components within normal limits  GLUCOSE, CAPILLARY - Abnormal; Notable for the following:    Glucose-Capillary 118 (*)    All other components within normal limits  GLUCOSE, CAPILLARY - Abnormal; Notable for the following:    Glucose-Capillary 130 (*)    All other components within normal limits  GLUCOSE, CAPILLARY - Abnormal; Notable for the following:    Glucose-Capillary 145 (*)    All other components within normal limits  GLUCOSE, CAPILLARY - Abnormal; Notable for the following:    Glucose-Capillary 115 (*)    All other components within normal limits  BASIC METABOLIC PANEL - Abnormal; Notable for the following:    Glucose, Bld 118 (*)    GFR calc non Af Amer 65 (*)    GFR calc Af Amer 76 (*)    All other components within normal limits  CBC - Abnormal; Notable for the following:    Platelets 143 (*)    All other components within normal limits  CBG MONITORING, ED - Abnormal; Notable for the following:    Glucose-Capillary 135 (*)     All other components within normal limits  URINE CULTURE  CULTURE, EXPECTORATED SPUTUM-ASSESSMENT  GRAM STAIN  CBC  ETHANOL  SALICYLATE LEVEL  CK  PROTIME-INR  HIV ANTIBODY (ROUTINE TESTING)  LEGIONELLA ANTIGEN, URINE  STREP PNEUMONIAE URINARY ANTIGEN  CBC  CBC WITH DIFFERENTIAL  TROPONIN I  LACTIC ACID, PLASMA  MAGNESIUM  CARBAMAZEPINE LEVEL, TOTAL  TROPONIN I    Imaging Review No results found.   EKG Interpretation   Date/Time:  Sunday August 22 2014 15:33:39 EST Ventricular Rate:  67 PR Interval:  217 QRS  Duration: 107 QT Interval:  449 QTC Calculation: 474 R Axis:   -26 Text Interpretation:  Sinus rhythm Borderline prolonged PR interval  Borderline left axis deviation Abnormal R-wave progression, early  transition Borderline T wave abnormalities ED PHYSICIAN INTERPRETATION  AVAILABLE IN [facility] Confirmed by TEST, Record ([id number]) on  08/24/2014 8:42:54 AM      MDM   Final diagnoses:  Altered mental status  UTI CAP  Pt presenting with altered mental status, she recently started on baclofen last night- workup in the ED shows UTI and CAP.  Head CT reassuring.  D/w Dr. Roel Cluck for admission to medical service.  Started on rocephin/azithro to cover both CAP and UTI.    Threasa Beards, MD 08/26/14 [phone]

## 2014-08-22 NOTE — ED Notes (Signed)
Per EMS, they were called out for confusion. Pt family stated pt took Baclofen last pm and did not move from couch all night. Pt presents with incontinence of bladder, drowsy but arousable. Urine with foul smell.

## 2014-08-22 NOTE — ED Notes (Signed)
Pt placed into gown and on monitor upon arrival to room. Pt monitored by blood pressure, pulse ox, and 12 lead. Assisted Rn with in and out cath. Pts EKG given to and signed by Dr. Thurnell Garbe

## 2014-08-22 NOTE — H&P (Signed)
PCP: [name], MD  [facility]  Chief Complaint:  confusion  HPI: Janice Morrow is a 68 y.o. female   has a past medical history of Heart murmur; Headache(784.0); COPD (chronic obstructive pulmonary disease); GERD (gastroesophageal reflux disease); PVD (peripheral vascular disease); Hyperlipidemia; Cervicalgia; Migraine without aura, with intractable migraine, so stated, without mention of status migrainosus; PVD (peripheral vascular disease); Osteoarthritis; Chronic back pain; and Trigeminal neuralgia.   Presented with  Patient with chronic back pain she was recently prescribed baclofen.  After taking it for a few days she became confused, have not moved off the couch and became incontinent of foul-smelling urine. EMS brought her to [facility] ER and was found to have UTI and CXR was worrisome for CAP. Patient endorses fever chills and cough. Patient has hx of trigeminal neuralgia she is on tegretol for that.  In ER she was treated with Levaquin  Hospitalist was called for admission for UTI, CAP  Review of Systems:    Pertinent positives include: Fevers, chills, fatigue, nausea, vomiting,  productive cough,  Constitutional:  No weight loss, night sweats, weight loss  HEENT:  No headaches, Difficulty swallowing,Tooth/dental problems,Sore throat,  No sneezing, itching, ear ache, nasal congestion, post nasal drip,  Cardio-vascular:  No chest pain, Orthopnea, PND, anasarca, dizziness, palpitations.no Bilateral lower extremity swelling  GI:  No heartburn, indigestion, abdominal pain, diarrhea, change in bowel habits, loss of appetite, melena, blood in stool, hematemesis Resp:  no shortness of breath at rest. No dyspnea on exertion, No excess mucus, no No non-productive cough, No coughing up of blood.No change in color of mucus.No wheezing. Skin:  no rash or lesions. No jaundice GU:  no dysuria, change in color of urine, no urgency or frequency. No straining to urinate.  No flank  pain.  Musculoskeletal:  No joint pain or no joint swelling. No decreased range of motion. No back pain.  Psych:  No change in mood or affect. No depression or anxiety. No memory loss.  Neuro: no localizing neurological complaints, no tingling, no weakness, no double vision, no gait abnormality, no slurred speech, no confusion  Otherwise ROS are negative except for above, 10 systems were reviewed  Past Medical History: Past Medical History  Diagnosis Date  . Heart murmur   . Headache(784.0)   . COPD (chronic obstructive pulmonary disease)   . GERD (gastroesophageal reflux disease)   . PVD (peripheral vascular disease)   . Hyperlipidemia   . Cervicalgia   . Migraine without aura, with intractable migraine, so stated, without mention of status migrainosus   . PVD (peripheral vascular disease)   . Osteoarthritis   . Chronic back pain   . Trigeminal neuralgia    Past Surgical History  Procedure Laterality Date  . Dental surgery       Medications: Prior to Admission medications   Medication Sig Start Date End Date Taking? Authorizing Provider  baclofen (LIORESAL) 20 MG tablet Take 20 mg by mouth 3 (three) times daily.   Yes Historical Provider, MD  carbamazepine (TEGRETOL) 200 MG tablet Take 400 mg by mouth 2 (two) times daily. 07/26/14  Yes Historical Provider, MD    Allergies:  No Known Allergies  Social History:  Ambulatory   walker she borrows from her brother   Lives at home With family     reports that she quit smoking about 4 years ago. Her smoking use included Cigarettes. She smoked 0.00 packs per day. She has never used smokeless tobacco. She reports that  she does not drink alcohol or use illicit drugs.    Family History: family history includes Diabetes in her brother, mother, and another family member; Heart attack in her father; Heart disease in an other family member.    Physical Exam: Patient Vitals for the past 24 hrs:  BP Temp Temp src Pulse Resp  SpO2  08/22/14 2130 157/65 mmHg - - - 17 -  08/22/14 2034 - - - - - 96 %  08/22/14 2032 150/77 mmHg - - - 22 -  08/22/14 2028 168/80 mmHg - - 68 19 95 %  08/22/14 1848 161/85 mmHg - - 62 17 95 %  08/22/14 1742 166/94 mmHg 97.8 F (36.6 C) Rectal 64 18 100 %  08/22/14 1700 171/86 mmHg - - (!) 50 17 99 %  08/22/14 1655 183/99 mmHg - - (!) 53 14 98 %  08/22/14 1534 158/78 mmHg 98.1 F (36.7 C) - 67 18 92 %    1. General:  in No Acute distress 2. Psychological: Alert and  Oriented 3. Head/ENT:   Moist   Mucous Membranes                          Head Non traumatic, neck supple                          Poor Dentition 4. SKIN: normal  Skin turgor,  Skin clean Dry and intact no rash 5. Heart: Regular rate and rhythm no Murmur, Rub or gallop 6. Lungs: no wheezes occasional crackles  distant 7. Abdomen: Soft, non-tender, Non distended, obese 8. Lower extremities: no clubbing, cyanosis, or edema severe obesity 9. Neurologically Grossly intact, moving all 4 extremities equally 10. MSK: Normal range of motion  body mass index is unknown because there is no weight on file.   Labs on Admission:   Results for orders placed or performed during the hospital encounter of 08/22/14 (from the past 24 hour(s))  CBC     Status: None   Collection Time: 08/22/14  3:36 PM  Result Value Ref Range   WBC 6.5 4.0 - 10.5 K/uL   RBC 4.67 3.87 - 5.11 MIL/uL   Hemoglobin 13.2 12.0 - 15.0 g/dL   HCT 42.4 36.0 - 46.0 %   MCV 90.8 78.0 - 100.0 fL   MCH 28.3 26.0 - 34.0 pg   MCHC 31.1 30.0 - 36.0 g/dL   RDW 14.6 11.5 - 15.5 %   Platelets 167 150 - 400 K/uL  Urinalysis, Routine w reflex microscopic     Status: Abnormal   Collection Time: 08/22/14  3:59 PM  Result Value Ref Range   Color, Urine AMBER (A) YELLOW   APPearance CLOUDY (A) CLEAR   Specific Gravity, Urine 1.022 1.005 - 1.030   pH 6.0 5.0 - 8.0   Glucose, UA NEGATIVE NEGATIVE mg/dL   Hgb urine dipstick MODERATE (A) NEGATIVE   Bilirubin Urine  NEGATIVE NEGATIVE   Ketones, ur NEGATIVE NEGATIVE mg/dL   Protein, ur NEGATIVE NEGATIVE mg/dL   Urobilinogen, UA 1.0 0.0 - 1.0 mg/dL   Nitrite POSITIVE (A) NEGATIVE   Leukocytes, UA LARGE (A) NEGATIVE  Urine rapid drug screen (hosp performed)     Status: Abnormal   Collection Time: 08/22/14  3:59 PM  Result Value Ref Range   Opiates POSITIVE (A) NONE DETECTED   Cocaine NONE DETECTED NONE DETECTED   Benzodiazepines NONE DETECTED NONE DETECTED  Amphetamines NONE DETECTED NONE DETECTED   Tetrahydrocannabinol NONE DETECTED NONE DETECTED   Barbiturates NONE DETECTED NONE DETECTED  Urine microscopic-add on     Status: Abnormal   Collection Time: 08/22/14  3:59 PM  Result Value Ref Range   Squamous Epithelial / LPF RARE RARE   WBC, UA 11-20 <3 WBC/hpf   RBC / HPF 3-6 <3 RBC/hpf   Bacteria, UA MANY (A) RARE   Urine-Other MICROSCOPIC EXAM PERFORMED ON UNCONCENTRATED URINE   Ammonia     Status: Abnormal   Collection Time: 08/22/14  4:41 PM  Result Value Ref Range   Ammonia 47 (H) 11 - 32 umol/L  Comprehensive metabolic panel     Status: Abnormal   Collection Time: 08/22/14  4:41 PM  Result Value Ref Range   Sodium 141 135 - 145 mmol/L   Potassium 4.3 3.5 - 5.1 mmol/L   Chloride 107 96 - 112 mEq/L   CO2 29 19 - 32 mmol/L   Glucose, Bld 136 (H) 70 - 99 mg/dL   BUN 13 6 - 23 mg/dL   Creatinine, Ser 0.82 0.50 - 1.10 mg/dL   Calcium 9.1 8.4 - 10.5 mg/dL   Total Protein 7.0 6.0 - 8.3 g/dL   Albumin 3.8 3.5 - 5.2 g/dL   AST 23 0 - 37 U/L   ALT 18 0 - 35 U/L   Alkaline Phosphatase 66 39 - 117 U/L   Total Bilirubin 0.6 0.3 - 1.2 mg/dL   GFR calc non Af Amer 73 (L) >90 mL/min   GFR calc Af Amer 85 (L) >90 mL/min   Anion gap 5 5 - 15  Ethanol     Status: None   Collection Time: 08/22/14  4:41 PM  Result Value Ref Range   Alcohol, Ethyl (B) <5 0 - 9 mg/dL  Salicylate level     Status: None   Collection Time: 08/22/14  4:41 PM  Result Value Ref Range   Salicylate Lvl <1.9 2.8 - 20.0  mg/dL  Acetaminophen level     Status: Abnormal   Collection Time: 08/22/14  4:41 PM  Result Value Ref Range   Acetaminophen (Tylenol), Serum <10.0 (L) 10 - 30 ug/mL  CK     Status: None   Collection Time: 08/22/14  4:41 PM  Result Value Ref Range   Total CK 164 7 - 177 U/L  Protime-INR     Status: None   Collection Time: 08/22/14  4:41 PM  Result Value Ref Range   Prothrombin Time 14.6 11.6 - 15.2 seconds   INR 1.12 0.00 - 1.49  CBG monitoring, ED     Status: Abnormal   Collection Time: 08/22/14  5:41 PM  Result Value Ref Range   Glucose-Capillary 135 (H) 70 - 99 mg/dL    UA evidence of UTI  Lab Results  Component Value Date   HGBA1C * 08/22/2010    6.5 (NOTE)                                                                       According to the ADA Clinical Practice Recommendations for 2011, when HbA1c is used as a screening test:   >=6.5%   Diagnostic of Diabetes Mellitus           (  if abnormal result  is confirmed)  5.7-6.4%   Increased risk of developing Diabetes Mellitus  References:Diagnosis and Classification of Diabetes Mellitus,Diabetes GUYQ,0347,42(VZDGL 1):S62-S69 and Standards of Medical Care in         Diabetes - 2011,Diabetes OVFI,4332,95  (Suppl 1):S11-S61.    CrCl cannot be calculated (Unknown ideal weight.).  BNP (last 3 results) No results for input(s): PROBNP in the last 8760 hours.  Other results:  I have pearsonaly reviewed this: ECG REPORT  Rate: 67  Rhythm: NSR ST&T Change: no ischemia   There were no vitals filed for this visit.   Cultures: No results found for: SDES, [place], CULT, REPTSTATUS   Radiological Exams on Admission: Ct Head Wo Contrast  08/22/2014   CLINICAL DATA:  Altered mental status, incontinence  EXAM: CT HEAD WITHOUT CONTRAST  TECHNIQUE: Contiguous axial images were obtained from the base of the skull through the vertex without intravenous contrast.  COMPARISON:  04/27/2014  FINDINGS: The bony calvarium is intact. No  soft tissue changes are seen. Mild atrophic changes are noted. No findings to suggest acute hemorrhage, acute infarction or space-occupying mass lesion are noted.  IMPRESSION: Mild atrophic changes without acute abnormality. No change from the prior exam.   Electronically Signed   By: Inez Catalina M.D.   On: 08/22/2014 20:08   Dg Chest Portable 1 View  08/22/2014   CLINICAL DATA:  Confusion.  EXAM: PORTABLE CHEST - 1 VIEW  COMPARISON:  08/21/2010.  FINDINGS: Increased body habitus. Cardiomegaly. Patchy BILATERAL airspace opacities could represent mild vascular congestion, early congestive failure, or early BILATERAL pneumonia. Correlate clinically. Consider PA and lateral chest when stable. Worsening aeration from priors.  IMPRESSION: Patchy BILATERAL airspace opacities with cardiomegaly. See discussion above.   Electronically Signed   By: Rolla Flatten M.D.   On: 08/22/2014 16:43    Chart has been reviewed  Assessment/Plan 68 year old female with history of chronic pain presents with altered mental status in the setting of UTI and possible pneumonia as well as using new medication  Present on Admission:  . UTI (lower urinary tract infection) - treated Rocephin await results of urine culture  . CAP (community acquired pneumonia) - Rocephin and azithromycin, oxygen as needed, repeat imaging as an outpatient to document clearing  . Trigeminal neuralgia - continue Tegretol   Prophylaxis:   Lovenox,   CODE STATUS:  FULL CODE   Other plan as per orders.  I have spent a total of 55 min on this admission  Mikale Silversmith 08/22/2014, 9:46 PM  [facility]  Pager [phone]   after 2 AM please page floor coverage PA If 7AM-7PM, please contact the day team taking care of the patient  [email or web address]  Password [email or web address]

## 2014-08-22 NOTE — ED Notes (Signed)
Provided pt with pericare and full linen change

## 2014-08-22 NOTE — ED Notes (Signed)
Pt has been reassigned to from [place] to 2W.

## 2014-08-22 NOTE — ED Notes (Signed)
Pt cleaned of soiled linen pt was incontinent of urine

## 2014-08-22 NOTE — ED Notes (Signed)
Pt to CT at this time.

## 2014-08-22 NOTE — ED Notes (Signed)
Provided pt with pericare for bowel movement. Pt remains monitored by blood pressure, pulse ox, and 5 lead.

## 2014-08-23 ENCOUNTER — Other Ambulatory Visit: Payer: Self-pay | Admitting: Nurse Practitioner

## 2014-08-23 ENCOUNTER — Encounter (HOSPITAL_COMMUNITY): Payer: Self-pay

## 2014-08-23 DIAGNOSIS — G894 Chronic pain syndrome: Secondary | ICD-10-CM

## 2014-08-23 DIAGNOSIS — G9340 Encephalopathy, unspecified: Secondary | ICD-10-CM

## 2014-08-23 LAB — COMPREHENSIVE METABOLIC PANEL
ALBUMIN: 3.8 g/dL (ref 3.5–5.2)
ALK PHOS: 68 U/L (ref 39–117)
ALT: 18 U/L (ref 0–35)
AST: 17 U/L (ref 0–37)
Anion gap: 9 (ref 5–15)
BUN: 9 mg/dL (ref 6–23)
CO2: 26 mmol/L (ref 19–32)
Calcium: 9.2 mg/dL (ref 8.4–10.5)
Chloride: 106 mEq/L (ref 96–112)
Creatinine, Ser: 0.82 mg/dL (ref 0.50–1.10)
GFR calc Af Amer: 85 mL/min — ABNORMAL LOW (ref >90)
GFR calc non Af Amer: 73 mL/min — ABNORMAL LOW (ref >90)
Glucose, Bld: 130 mg/dL — ABNORMAL HIGH (ref 70–99)
POTASSIUM: 4.1 mmol/L (ref 3.5–5.1)
SODIUM: 141 mmol/L (ref 135–145)
TOTAL PROTEIN: 7.2 g/dL (ref 6.0–8.3)
Total Bilirubin: 0.3 mg/dL (ref 0.3–1.2)

## 2014-08-23 LAB — CBC WITH DIFFERENTIAL/PLATELET
Basophils Absolute: 0 10*3/uL (ref 0.0–0.1)
Basophils Relative: 0 % (ref 0–1)
Eosinophils Absolute: 0 10*3/uL (ref 0.0–0.7)
Eosinophils Relative: 0 % (ref 0–5)
HCT: 45 % (ref 36.0–46.0)
HEMOGLOBIN: 13.9 g/dL (ref 12.0–15.0)
LYMPHS PCT: 26 % (ref 12–46)
Lymphs Abs: 2.2 10*3/uL (ref 0.7–4.0)
MCH: 28.8 pg (ref 26.0–34.0)
MCHC: 30.9 g/dL (ref 30.0–36.0)
MCV: 93.4 fL (ref 78.0–100.0)
MONOS PCT: 9 % (ref 3–12)
Monocytes Absolute: 0.8 10*3/uL (ref 0.1–1.0)
Neutro Abs: 5.7 10*3/uL (ref 1.7–7.7)
Neutrophils Relative %: 65 % (ref 43–77)
PLATELETS: 156 10*3/uL (ref 150–400)
RBC: 4.82 MIL/uL (ref 3.87–5.11)
RDW: 14.4 % (ref 11.5–15.5)
WBC: 8.7 10*3/uL (ref 4.0–10.5)

## 2014-08-23 LAB — CBC
HCT: 44.2 % (ref 36.0–46.0)
HEMOGLOBIN: 13.6 g/dL (ref 12.0–15.0)
MCH: 28.7 pg (ref 26.0–34.0)
MCHC: 30.8 g/dL (ref 30.0–36.0)
MCV: 93.2 fL (ref 78.0–100.0)
Platelets: 180 10*3/uL (ref 150–400)
RBC: 4.74 MIL/uL (ref 3.87–5.11)
RDW: 14.3 % (ref 11.5–15.5)
WBC: 7.1 10*3/uL (ref 4.0–10.5)

## 2014-08-23 LAB — CREATININE, SERUM
CREATININE: 0.7 mg/dL (ref 0.50–1.10)
GFR calc Af Amer: >90 mL/min (ref >90)
GFR calc non Af Amer: 88 mL/min — ABNORMAL LOW (ref >90)

## 2014-08-23 LAB — STREP PNEUMONIAE URINARY ANTIGEN: STREP PNEUMO URINARY ANTIGEN: NEGATIVE

## 2014-08-23 LAB — HIV ANTIBODY (ROUTINE TESTING W REFLEX): HIV 1&2 Ab, 4th Generation: NONREACTIVE

## 2014-08-23 MED ORDER — KETOROLAC TROMETHAMINE 15 MG/ML IJ SOLN
30.0000 mg | Freq: Once | INTRAMUSCULAR | Status: AC
  Administered 2014-08-23: 30 mg via INTRAVENOUS
  Filled 2014-08-23: qty 2

## 2014-08-23 MED ORDER — METOCLOPRAMIDE HCL 5 MG/ML IJ SOLN
10.0000 mg | Freq: Once | INTRAMUSCULAR | Status: AC
  Administered 2014-08-23: 10 mg via INTRAVENOUS
  Filled 2014-08-23: qty 2

## 2014-08-23 MED ORDER — DIPHENHYDRAMINE HCL 50 MG/ML IJ SOLN
25.0000 mg | Freq: Once | INTRAMUSCULAR | Status: AC
  Administered 2014-08-23: 25 mg via INTRAVENOUS
  Filled 2014-08-23: qty 1

## 2014-08-23 MED ORDER — HYDROCODONE-ACETAMINOPHEN 10-325 MG PO TABS
2.0000 | ORAL_TABLET | Freq: Three times a day (TID) | ORAL | Status: DC
  Administered 2014-08-23 – 2014-08-26 (×7): 2 via ORAL
  Filled 2014-08-23 (×7): qty 2

## 2014-08-23 NOTE — Progress Notes (Signed)
UR Completed.  [phone]  

## 2014-08-23 NOTE — Progress Notes (Addendum)
PROGRESS NOTE    Janice Morrow EGB:[id number] DOB: 07-11-1946 DOA: 08/22/2014 PCP: [name], MD  Primary Neurology: Dr. Antony Contras. Primary Pain Mx: Prince Solian   HPI/Brief narrative 68 year old female with history of cluster headaches, chronic pain, COPD, GERD, HLD, trigeminal neuralgia, states that she had run out of her hydrocodone for the last 2 weeks, recently started on baclofen last Tuesday by her pain M.D. and last dose was on 12/26, presented to the ED with confusion, urinary incontinence and foul-smelling urine. In the ED, UA suggestive of UTI and chest x-ray concerning for CAP.   Assessment/Plan:  1. Acute encephalopathy: Likely secondary to baclofen, UTI and pneumonia. Mental status changes seem to have resolved. CT head negative. 2. UTI: Continue IV Rocephin pending urine culture results. 3. Community acquired pneumonia:? Aspiration secondary to mental status changes. Continue IV Rocephin and azithromycin. Urine pneumococcal antigen negative. 4. Chronic pain/cluster headaches/trigeminal neuralgia: [facility] home medications [facility] and resume same without changes. DC baclofen indefinitely. 5. Morbid obesity   Code Status: Full Family Communication: Discussed with brother at bedside Disposition Plan: Home when medically stable   Consultants:  None  Procedures:  None  Antibiotics:  IV Rocephin  IV Azithromycin   Subjective: Complains of headaches which are gradually increasing. Also has a spot on her right thumb/nail which she's had for 2 weeks which is intermittently painful. Denies cough or dyspnea at this time. Has questions about her pain medications.  Objective: Filed Vitals:   08/22/14 2253 08/22/14 2350 08/23/14 0447 08/23/14 0509  BP:  166/61 155/75   Pulse: 67 74 82   Temp: 98.1 F (36.7 C)  98.6 F (37 C)   TempSrc: Oral  Oral   Resp:   20   Height: 5\' 5"  (1.651 m)     Weight: 151.6 kg (334 lb 3.5 oz)     SpO2: 98%   88% 100%    Intake/Output Summary (Last 24 hours) at 08/23/14 1426 Last data filed at 08/23/14 0448  Gross per 24 hour  Intake    690 ml  Output   1125 ml  Net   -435 ml   Filed Weights   08/22/14 2253  Weight: 151.6 kg (334 lb 3.5 oz)     Exam:  General exam: Pleasant middle-aged female, moderately built and morbidly obese, sitting up comfortably chair. Respiratory system: Clear. No increased work of breathing. Cardiovascular system: S1 & S2 heard, RRR. No JVD, murmurs, gallops, clicks or pedal edema. Telemetry: Sinus rhythm. Gastrointestinal system: Abdomen is nondistended, soft and nontender. Normal bowel sounds heard. Central nervous system: Alert and oriented. No focal neurological deficits. Extremities: Symmetric 5 x 5 power. Patient has discoloration and mild tenderness of right thumb & nail without acute changes.   Data Reviewed: Basic Metabolic Panel:  Recent Labs Lab 08/22/14 1641 08/23/14 0010 08/23/14 0453  NA 141  --  141  K 4.3  --  4.1  CL 107  --  106  CO2 29  --  26  GLUCOSE 136*  --  130*  BUN 13  --  9  CREATININE 0.82 0.70 0.82  CALCIUM 9.1  --  9.2   Liver Function Tests:  Recent Labs Lab 08/22/14 1641 08/23/14 0453  AST 23 17  ALT 18 18  ALKPHOS 66 68  BILITOT 0.6 0.3  PROT 7.0 7.2  ALBUMIN 3.8 3.8   No results for input(s): LIPASE, AMYLASE in the last 168 hours.  Recent Labs Lab 08/22/14  1641  AMMONIA 47*   CBC:  Recent Labs Lab 08/22/14 1536 08/23/14 0010 08/23/14 0453  WBC 6.5 7.1 8.7  NEUTROABS  --   --  5.7  HGB 13.2 13.6 13.9  HCT 42.4 44.2 45.0  MCV 90.8 93.2 93.4  PLT 167 180 156   Cardiac Enzymes:  Recent Labs Lab 08/22/14 1641  CKTOTAL 164   BNP (last 3 results) No results for input(s): PROBNP in the last 8760 hours. CBG:  Recent Labs Lab 08/22/14 1741  GLUCAP 135*    No results found for this or any previous visit (from the past 240 hour(s)).         Studies: Ct Head Wo  Contrast  08/22/2014   CLINICAL DATA:  Altered mental status, incontinence  EXAM: CT HEAD WITHOUT CONTRAST  TECHNIQUE: Contiguous axial images were obtained from the base of the skull through the vertex without intravenous contrast.  COMPARISON:  04/27/2014  FINDINGS: The bony calvarium is intact. No soft tissue changes are seen. Mild atrophic changes are noted. No findings to suggest acute hemorrhage, acute infarction or space-occupying mass lesion are noted.  IMPRESSION: Mild atrophic changes without acute abnormality. No change from the prior exam.   Electronically Signed   By: Inez Catalina M.D.   On: 08/22/2014 20:08   Dg Chest Portable 1 View  08/22/2014   CLINICAL DATA:  Confusion.  EXAM: PORTABLE CHEST - 1 VIEW  COMPARISON:  08/21/2010.  FINDINGS: Increased body habitus. Cardiomegaly. Patchy BILATERAL airspace opacities could represent mild vascular congestion, early congestive failure, or early BILATERAL pneumonia. Correlate clinically. Consider PA and lateral chest when stable. Worsening aeration from priors.  IMPRESSION: Patchy BILATERAL airspace opacities with cardiomegaly. See discussion above.   Electronically Signed   By: Rolla Flatten M.D.   On: 08/22/2014 16:43        Scheduled Meds: . azithromycin  500 mg Intravenous Q24H  . baclofen  20 mg Oral TID  . carbamazepine  400 mg Oral BID  . cefTRIAXone (ROCEPHIN)  IV  1 g Intravenous Q24H  . enoxaparin (LOVENOX) injection  40 mg Subcutaneous Q24H   Continuous Infusions: . sodium chloride 75 mL/hr at 08/22/14 2334    Active Problems:   Trigeminal neuralgia   UTI (lower urinary tract infection)   CAP (community acquired pneumonia)   Altered mental status    Time spent: 82 minutes    Franki Stemen, MD, FACP, FHM. [facility] Pager [phone]  If 7PM-7AM, please contact night-coverage [email or web address] Password [email or web address] 08/23/2014, 2:26 PM    LOS: 1 day

## 2014-08-23 NOTE — Plan of Care (Signed)
Problem: Phase I Progression Outcomes Goal: Pain controlled with appropriate interventions Outcome: Not Progressing Patient states she still has a headache.

## 2014-08-24 LAB — GLUCOSE, CAPILLARY
GLUCOSE-CAPILLARY: 140 mg/dL — AB (ref 70–99)
Glucose-Capillary: 118 mg/dL — ABNORMAL HIGH (ref 70–99)
Glucose-Capillary: 130 mg/dL — ABNORMAL HIGH (ref 70–99)
Glucose-Capillary: 145 mg/dL — ABNORMAL HIGH (ref 70–99)

## 2014-08-24 MED ORDER — AZITHROMYCIN 500 MG PO TABS
500.0000 mg | ORAL_TABLET | Freq: Every day | ORAL | Status: DC
  Administered 2014-08-24 – 2014-08-25 (×2): 500 mg via ORAL
  Filled 2014-08-24 (×3): qty 1

## 2014-08-24 MED ORDER — HYDROCODONE-ACETAMINOPHEN 10-325 MG PO TABS
2.0000 | ORAL_TABLET | Freq: Once | ORAL | Status: AC
  Administered 2014-08-24: 2 via ORAL
  Filled 2014-08-24: qty 2

## 2014-08-24 NOTE — Progress Notes (Signed)
PROGRESS NOTE    RONAN DION ZOX:[id number] DOB: 1945-09-16 DOA: 08/22/2014 PCP: [name], MD  Primary Neurology: Dr. Antony Contras. Primary Pain Mx: Prince Solian   HPI/Brief narrative 68 year old female with history of cluster headaches, chronic pain, COPD, GERD, HLD, trigeminal neuralgia, states that she had run out of her hydrocodone for the last 2 weeks, recently started on baclofen last Tuesday by her pain M.D. and last dose was on 12/26, presented to the ED with confusion, urinary incontinence and foul-smelling urine. In the ED, UA suggestive of UTI and chest x-ray concerning for CAP.   Assessment/Plan:  1. Acute encephalopathy: Likely secondary to baclofen, UTI and pneumonia. Mental status changes seem to have resolved. CT head negative. 2. UTI: Continue IV Rocephin pending urine culture results. Urine culture: Pending 3. Community acquired pneumonia:? Aspiration secondary to mental status changes. Continue IV Rocephin and azithromycin. Urine pneumococcal antigen negative. HIV antibody: Nonreactive. Improving. Will need follow-up chest x-ray in 4-6 weeks to ensure resolution of pneumonia findings. 4. Chronic pain/cluster headaches/trigeminal neuralgia: [facility] and resumed same without changes. DC baclofen indefinitely. As per patient, has chronic 6/10 right-sided headache at home even on medications. 5. Morbid obesity   Code Status: Full Family Communication: Discussed with brother at bedside on 12/28 Disposition Plan: Home when medically stable: Possibly 12/30   Consultants:  None  Procedures:  None  Antibiotics:  IV Rocephin  IV Azithromycin   Subjective: Complains of chronic right sided headache consistent with her cluster headaches and rates it at 7/10 (usually 6/10 at baseline). Denies dyspnea. Has some anterior chest pain only on coughing or deep breaths.  Objective: Filed Vitals:   08/23/14 0509 08/23/14 2025  08/23/14 2330 08/24/14 0500  BP:  175/98 131/68 126/67  Pulse:  86 91 83  Temp:  98.7 F (37.1 C)  99.5 F (37.5 C)  TempSrc:  Oral  Oral  Resp:  18  18  Height:      Weight:      SpO2: 100% 100%  94%    Intake/Output Summary (Last 24 hours) at 08/24/14 0844 Last data filed at 08/24/14 0701  Gross per 24 hour  Intake      0 ml  Output   2125 ml  Net  -2125 ml   Filed Weights   08/22/14 2253  Weight: 151.6 kg (334 lb 3.5 oz)     Exam:  General exam: Pleasant middle-aged female, moderately built and morbidly obese, sitting up comfortably chair. Does not appear in pain or any distress. Respiratory system: Slightly diminished breath sounds in the bases but otherwise clear to auscultation. No increased work of breathing. Cardiovascular system: S1 & S2 heard, RRR. No JVD, murmurs, gallops, clicks or pedal edema. Telemetry: Sinus rhythm. Gastrointestinal system: Abdomen is nondistended, soft and nontender. Normal bowel sounds heard. Central nervous system: Alert and oriented. No focal neurological deficits. Extremities: Symmetric 5 x 5 power. Patient has discoloration and mild tenderness of right thumb & nail without acute changes.   Data Reviewed: Basic Metabolic Panel:  Recent Labs Lab 08/22/14 1641 08/23/14 0010 08/23/14 0453  NA 141  --  141  K 4.3  --  4.1  CL 107  --  106  CO2 29  --  26  GLUCOSE 136*  --  130*  BUN 13  --  9  CREATININE 0.82 0.70 0.82  CALCIUM 9.1  --  9.2   Liver Function Tests:  Recent Labs Lab 08/22/14  1641 08/23/14 0453  AST 23 17  ALT 18 18  ALKPHOS 66 68  BILITOT 0.6 0.3  PROT 7.0 7.2  ALBUMIN 3.8 3.8   No results for input(s): LIPASE, AMYLASE in the last 168 hours.  Recent Labs Lab 08/22/14 1641  AMMONIA 47*   CBC:  Recent Labs Lab 08/22/14 1536 08/23/14 0010 08/23/14 0453  WBC 6.5 7.1 8.7  NEUTROABS  --   --  5.7  HGB 13.2 13.6 13.9  HCT 42.4 44.2 45.0  MCV 90.8 93.2 93.4  PLT 167 180 156   Cardiac  Enzymes:  Recent Labs Lab 08/22/14 1641  CKTOTAL 164   BNP (last 3 results) No results for input(s): PROBNP in the last 8760 hours. CBG:  Recent Labs Lab 08/22/14 1741 08/24/14 0653  GLUCAP 135* 140*    Recent Results (from the past 240 hour(s))  Urine culture     Status: None (Preliminary result)   Collection Time: 08/22/14  3:59 PM  Result Value Ref Range Status   Specimen Description URINE, CATHETERIZED  Final   Special Requests NONE  Final   Colony Count PENDING  Incomplete   Culture   Final    Culture reincubated for better growth Performed at [facility]    Report Status PENDING  Incomplete           Studies: Ct Head Wo Contrast  08/22/2014   CLINICAL DATA:  Altered mental status, incontinence  EXAM: CT HEAD WITHOUT CONTRAST  TECHNIQUE: Contiguous axial images were obtained from the base of the skull through the vertex without intravenous contrast.  COMPARISON:  04/27/2014  FINDINGS: The bony calvarium is intact. No soft tissue changes are seen. Mild atrophic changes are noted. No findings to suggest acute hemorrhage, acute infarction or space-occupying mass lesion are noted.  IMPRESSION: Mild atrophic changes without acute abnormality. No change from the prior exam.   Electronically Signed   By: Inez Catalina M.D.   On: 08/22/2014 20:08   Dg Chest Portable 1 View  08/22/2014   CLINICAL DATA:  Confusion.  EXAM: PORTABLE CHEST - 1 VIEW  COMPARISON:  08/21/2010.  FINDINGS: Increased body habitus. Cardiomegaly. Patchy BILATERAL airspace opacities could represent mild vascular congestion, early congestive failure, or early BILATERAL pneumonia. Correlate clinically. Consider PA and lateral chest when stable. Worsening aeration from priors.  IMPRESSION: Patchy BILATERAL airspace opacities with cardiomegaly. See discussion above.   Electronically Signed   By: Rolla Flatten M.D.   On: 08/22/2014 16:43        Scheduled Meds: . azithromycin  500 mg Oral QHS  .  carbamazepine  400 mg Oral BID  . cefTRIAXone (ROCEPHIN)  IV  1 g Intravenous Q24H  . enoxaparin (LOVENOX) injection  40 mg Subcutaneous Q24H  . HYDROcodone-acetaminophen  2 tablet Oral TID PC   Continuous Infusions:    Active Problems:   Trigeminal neuralgia   UTI (lower urinary tract infection)   CAP (community acquired pneumonia)   Altered mental status    Time spent: 74 minutes    Emony Dormer, MD, FACP, FHM. [facility] Pager [phone]  If 7PM-7AM, please contact night-coverage [email or web address] Password [email or web address] 08/24/2014, 8:44 AM    LOS: 2 days

## 2014-08-24 NOTE — Clinical Social Work Note (Signed)
Clinical Social Worker received referral for cane at home per patient request.  Spoke with RN Case Manager who will follow up with patient to arrange equipment.    CSW signing off - please re consult if social work needs arise.  Barbette Or, [place]

## 2014-08-25 DIAGNOSIS — R0782 Intercostal pain: Secondary | ICD-10-CM

## 2014-08-25 LAB — CBC
HCT: 40.5 % (ref 36.0–46.0)
HEMOGLOBIN: 12.4 g/dL (ref 12.0–15.0)
MCH: 28.1 pg (ref 26.0–34.0)
MCHC: 30.6 g/dL (ref 30.0–36.0)
MCV: 91.6 fL (ref 78.0–100.0)
PLATELETS: 143 10*3/uL — AB (ref 150–400)
RBC: 4.42 MIL/uL (ref 3.87–5.11)
RDW: 14.6 % (ref 11.5–15.5)
WBC: 6.7 10*3/uL (ref 4.0–10.5)

## 2014-08-25 LAB — BASIC METABOLIC PANEL
ANION GAP: 5 (ref 5–15)
BUN: 15 mg/dL (ref 6–23)
CHLORIDE: 102 meq/L (ref 96–112)
CO2: 29 mmol/L (ref 19–32)
Calcium: 9.2 mg/dL (ref 8.4–10.5)
Creatinine, Ser: 0.9 mg/dL (ref 0.50–1.10)
GFR calc non Af Amer: 65 mL/min — ABNORMAL LOW (ref >90)
GFR, EST AFRICAN AMERICAN: 76 mL/min — AB (ref >90)
Glucose, Bld: 118 mg/dL — ABNORMAL HIGH (ref 70–99)
Potassium: 4.6 mmol/L (ref 3.5–5.1)
SODIUM: 136 mmol/L (ref 135–145)

## 2014-08-25 LAB — MAGNESIUM: MAGNESIUM: 2 mg/dL (ref 1.5–2.5)

## 2014-08-25 LAB — LEGIONELLA ANTIGEN, URINE

## 2014-08-25 LAB — CARBAMAZEPINE LEVEL, TOTAL: Carbamazepine Lvl: 7.7 ug/mL (ref 4.0–12.0)

## 2014-08-25 LAB — LACTIC ACID, PLASMA: Lactic Acid, Venous: 0.9 mmol/L (ref 0.5–2.2)

## 2014-08-25 LAB — TROPONIN I: Troponin I: <0.03 ng/mL (ref <0.031)

## 2014-08-25 LAB — GLUCOSE, CAPILLARY: GLUCOSE-CAPILLARY: 115 mg/dL — AB (ref 70–99)

## 2014-08-25 MED ORDER — NITROGLYCERIN 0.4 MG SL SUBL
SUBLINGUAL_TABLET | SUBLINGUAL | Status: AC
  Administered 2014-08-25: 0.4 mg
  Filled 2014-08-25: qty 1

## 2014-08-25 MED ORDER — NITROGLYCERIN 0.4 MG SL SUBL
SUBLINGUAL_TABLET | SUBLINGUAL | Status: AC
  Filled 2014-08-25: qty 2

## 2014-08-25 MED ORDER — ASPIRIN 81 MG PO CHEW
CHEWABLE_TABLET | ORAL | Status: AC
  Administered 2014-08-25: 324 mg
  Filled 2014-08-25: qty 4

## 2014-08-25 MED FILL — Medication: Qty: 1 | Status: AC

## 2014-08-25 NOTE — Code Documentation (Signed)
Paged to Code [facility]  Per RN, patient called her to endorse chest pain. At that time her BP was 154/101. As they were preparing to obtain an EKG the patient lost consciousness and "went limp" for a few seconds, at which time the code was called. As they lowered the head of the bed, the patient regained consciousness. She neve had a loss of pulse or respiratory compromise. On my arrival, she was bag masked. The patient was previously on a venti mask for comfort (was satting 91% on RA). She was able to be transitioned to this without issues. STAT labs (troponin, BMET, lactic acid, CBC, and Mag) were ordered.   Per the pt, her chest pain was 10/10 initially, however she remained HDS. Nitroglycerin x 3 was give with improvement in chest pain from 8>3>0. She was also give 4 ASA 81mg s.  Blood pressure 139/70, pulse 82, temperature 99.4 F (37.4 C), resp. rate 18, SpO2 95 % on ventimask  EKG revealed NSR with not change from previous EKG.  Discussed with primary team, [facility] NP on call, Chaney Malling who will resume care. On our departure, the patient had no chest pain, she was breathing comfortably on the ventimask, was able to speak in full sentences, and was hemodynamically stable. [facility] NP to follow up on STAT labs that were ordered and discuss with her team.   Code team leader, Dr Naaman Plummer with internal medicine teaching service  Archie Patten, MD [facility] Resident  08/25/2014, 1:48 AM

## 2014-08-25 NOTE — Progress Notes (Addendum)
Pt called for RN stating she was having CP.  RN to assess, pt states 10/10 CP pressure radiating to her back.  As EKG and vitals were being taken pt went unresponsive for approximately 5-10 seconds.  Code called and patient placed on monitor.  Pt responsive but still having 10/10 CP.  Per Rhab[place]i at bedside, 3 NTG (0108, 0113, 0118) and 4 baby aspirin administered.  After 3 NTG, CP resolved.  VSS per flowsheet.  Stat labs ordered per MD.  EKG assessed by MD at bedside.  Pt placed on telemetry, SR in 70-80s.  Pt resting in bed pain free with call bell in reach,  Will continue to monitor closely.  Pt contacted family to update them.

## 2014-08-25 NOTE — Progress Notes (Signed)
PROGRESS NOTE    Janice Morrow NLZ:[id number] DOB: September 21, 1945 DOA: 08/22/2014 PCP: [name], MD  Primary Neurology: Dr. Antony Contras. Primary Pain Mx: Prince Solian   HPI/Brief narrative 68 year old female with history of cluster headaches, chronic pain, COPD, GERD, HLD, trigeminal neuralgia, states that she had run out of her hydrocodone for the last 2 weeks, recently started on baclofen last Tuesday by her pain M.D. and last dose was on 12/26, presented to the ED with confusion, urinary incontinence and foul-smelling urine. In the ED, UA suggestive of UTI and chest x-ray concerning for CAP.   Assessment/Plan:  1. Acute encephalopathy: Likely secondary to baclofen, UTI and pneumonia. Mental status changes seem to have resolved. CT head negative. 2. UTI: Continue IV Rocephin pending urine culture results. Urine culture: gram neg rods 3. Community acquired pneumonia: legionella +. Continue IV Rocephin and azithromycin. Urine pneumococcal antigen negative. HIV antibody: Nonreactive. Improving. Will need follow-up chest x-ray in 4-6 weeks to ensure resolution of pneumonia findings. 4. Chronic pain/cluster headaches/trigeminal neuralgia: [facility] home medications [facility] and resumed same without changes. DC baclofen indefinitely. As per patient, has chronic 6/10 right-sided headache at home even on medications. 5. Morbid obesity 6. Chest pain- had episode last PM, CE negative thus far, telemetry ok  Once culture back for urine, may be able to de-escalate abx to just levaquin  Code Status: Full Family Communication: patient Disposition Plan: Home when medically stable: Possibly 12/31   Consultants:  None  Procedures:  None  Antibiotics:  IV Rocephin  IV Azithromycin   Subjective: Episode of chest pain last PM, none currently does have pain with deep breathing  Objective: Filed Vitals:   08/24/14 1351 08/24/14 2052 08/25/14 0113 08/25/14 0507  BP:  134/67 131/59 139/70 118/58  Pulse: 83 86 82 76  Temp: 98.1 F (36.7 C) 99.4 F (37.4 C)  97.4 F (36.3 C)  TempSrc: Oral Oral  Axillary  Resp: 18 18  18   Height:      Weight:      SpO2: 94% 94% 95% 99%    Intake/Output Summary (Last 24 hours) at 08/25/14 0805 Last data filed at 08/25/14 0532  Gross per 24 hour  Intake    360 ml  Output    800 ml  Net   -440 ml   Filed Weights   08/22/14 2253  Weight: 151.6 kg (334 lb 3.5 oz)     Exam:  General exam: Pleasant middle-aged female, moderately built and morbidly obese  Does not appear in pain or any distress. Respiratory system: Slightly diminished breath sounds in the bases but otherwise clear to auscultation. No increased work of breathing, no wheezing Cardiovascular system: S1 & S2 heard, RRR. No JVD, murmurs, gallops, clicks or pedal edema. Telemetry: Sinus rhythm. Gastrointestinal system: Abdomen is nondistended, soft and nontender. Normal bowel sounds heard. Central nervous system: Alert and oriented. No focal neurological deficits. Extremities: Symmetric 5 x 5 power   Data Reviewed: Basic Metabolic Panel:  Recent Labs Lab 08/22/14 1641 08/23/14 0010 08/23/14 0453 08/25/14 0105  NA 141  --  141 136  K 4.3  --  4.1 4.6  CL 107  --  106 102  CO2 29  --  26 29  GLUCOSE 136*  --  130* 118*  BUN 13  --  9 15  CREATININE 0.82 0.70 0.82 0.90  CALCIUM 9.1  --  9.2 9.2  MG  --   --   --  2.0  Liver Function Tests:  Recent Labs Lab 08/22/14 1641 08/23/14 0453  AST 23 17  ALT 18 18  ALKPHOS 66 68  BILITOT 0.6 0.3  PROT 7.0 7.2  ALBUMIN 3.8 3.8   No results for input(s): LIPASE, AMYLASE in the last 168 hours.  Recent Labs Lab 08/22/14 1641  AMMONIA 47*   CBC:  Recent Labs Lab 08/22/14 1536 08/23/14 0010 08/23/14 0453 08/25/14 0105  WBC 6.5 7.1 8.7 6.7  NEUTROABS  --   --  5.7  --   HGB 13.2 13.6 13.9 12.4  HCT 42.4 44.2 45.0 40.5  MCV 90.8 93.2 93.4 91.6  PLT 167 180 156 143*   Cardiac  Enzymes:  Recent Labs Lab 08/22/14 1641 08/25/14 0105  CKTOTAL 164  --   TROPONINI  --  <0.03   BNP (last 3 results) No results for input(s): PROBNP in the last 8760 hours. CBG:  Recent Labs Lab 08/24/14 0653 08/24/14 1153 08/24/14 1558 08/24/14 2148 08/25/14 0103  GLUCAP 140* 118* 130* 145* 115*    Recent Results (from the past 240 hour(s))  Urine culture     Status: None (Preliminary result)   Collection Time: 08/22/14  3:59 PM  Result Value Ref Range Status   Specimen Description URINE, CATHETERIZED  Final   Special Requests NONE  Final   Colony Count   Final    >=100,000 COLONIES/ML Performed at [facility] Performed at [facility]    Report Status PENDING  Incomplete           Studies: No results found.      Scheduled Meds: . azithromycin  500 mg Oral QHS  . carbamazepine  400 mg Oral BID  . cefTRIAXone (ROCEPHIN)  IV  1 g Intravenous Q24H  . enoxaparin (LOVENOX) injection  40 mg Subcutaneous Q24H  . HYDROcodone-acetaminophen  2 tablet Oral TID PC  . nitroGLYCERIN       Continuous Infusions:    Active Problems:   Trigeminal neuralgia   UTI (lower urinary tract infection)   CAP (community acquired pneumonia)   Altered mental status    Time spent: 25 minutes    VANN, JESSICA, DO. [facility] Pager [phone]  If 7PM-7AM, please contact night-coverage [email or web address] Password [email or web address] 08/25/2014, 8:05 AM    LOS: 3 days

## 2014-08-26 LAB — URINE CULTURE: Colony Count: >=100000

## 2014-08-26 MED ORDER — AZITHROMYCIN 500 MG PO TABS: 500.0000 mg | ORAL_TABLET | Freq: Every day | ORAL | Status: DC

## 2014-08-26 NOTE — Progress Notes (Signed)
Medicare Important Message given? YES  (If response is "NO", the following Medicare IM given date fields will be blank)  Date Medicare IM given: 08/26/14 Medicare IM given by:  Anisten Tomassi  

## 2014-08-26 NOTE — Care Management Note (Signed)
    Page 1 of 1   08/26/2014     3:07:41 PM CARE MANAGEMENT NOTE 08/26/2014  Patient:  Janice Morrow, Janice Morrow   Account Number:  [id number]  Date Initiated:  08/26/2014  Documentation initiated by:  Marvetta Gibbons  Subjective/Objective Assessment:   Pt admitted with UTI/PNA     Action/Plan:   PTA pt lived at home with brother   Anticipated DC Date:  08/26/2014   Anticipated DC Plan:  [place]  CM consult      Choice offered to / List presented to:     DME arranged  [facility]      DME agency  [facility].        Status of service:  Completed, signed off Medicare Important Message given?  YES (If response is "NO", the following Medicare IM given date fields will be blank) Date Medicare IM given:  08/26/2014 Medicare IM given by:  Marvetta Gibbons Date Additional Medicare IM given:   Additional Medicare IM given by:    Discharge Disposition:  HOME/SELF CARE  Per UR Regulation:  Reviewed for med. necessity/level of care/duration of stay  If discussed at [place] of Stay Meetings, dates discussed:    Comments:

## 2014-08-26 NOTE — Discharge Summary (Signed)
Physician Discharge Summary  Janice Morrow:[id number] DOB: 02/02/1946 DOA: 08/22/2014  PCP: [name], MD  Admit date: 08/22/2014 Discharge date: 08/26/2014  Time spent: 35 minutes  Recommendations for Outpatient Follow-up:  1. Repeat chest x ray  Discharge Diagnoses:  Active Problems:   Trigeminal neuralgia   UTI (lower urinary tract infection)   CAP (community acquired pneumonia)   Altered mental status   Discharge Condition: improved  Diet recommendation: cardiac  Filed Weights   08/22/14 2253  Weight: 151.6 kg (334 lb 3.5 oz)    History of present illness:  Janice Morrow is a 68 y.o. female   has a past medical history of Heart murmur; Headache(784.0); COPD (chronic obstructive pulmonary disease); GERD (gastroesophageal reflux disease); PVD (peripheral vascular disease); Hyperlipidemia; Cervicalgia; Migraine without aura, with intractable migraine, so stated, without mention of status migrainosus; PVD (peripheral vascular disease); Osteoarthritis; Chronic back pain; and Trigeminal neuralgia.   Presented with  Patient with chronic back pain she was recently prescribed baclofen. After taking it for a few days she became confused, have not moved off the couch and became incontinent of foul-smelling urine. EMS brought her to [facility] ER and was found to have UTI and CXR was worrisome for CAP. Patient endorses fever chills and cough. Patient has hx of trigeminal neuralgia she is on tegretol for that.  In ER she was treated with Levaquin  Hospitalist was called for admission for UTI, CAP  Hospital Course:  1. Acute encephalopathy: Likely secondary to baclofen, UTI and pneumonia. Mental status changes seem to have resolved. CT head negative. 2. UTI: IV rocephin- treated 3. Community acquired pneumonia: legionella +. Treat with 7 days of azithromycin Urine pneumococcal antigen negative. HIV antibody: Nonreactive. Improving. Will need follow-up chest x-ray in 4-6 weeks to  ensure resolution of pneumonia findings. 4. Chronic pain/cluster headaches/trigeminal neuralgia: [facility] and resumed same without changes. DC baclofen indefinitely. As per patient, has chronic 6/10 right-sided headache at home even on medications. 5. Morbid obesity 6. Chest pain-CE negative, tele ok  Procedures:    Consultations:    Discharge Exam: Filed Vitals:   08/26/14 0315  BP: 145/94  Pulse: 83  Temp: 98.9 F (37.2 C)  Resp: 18    General: A+Ox3, NAD Cardiovascular: rrr Respiratory: no wheezing  Discharge Instructions   Discharge Instructions    Diet general    Complete by:  As directed      Discharge instructions    Complete by:  As directed   Chest x ray 6 weeks to ensure PNA resolution     Increase activity slowly    Complete by:  As directed           Current Discharge Medication List    START taking these medications   Details  azithromycin (ZITHROMAX) 500 MG tablet Take 1 tablet (500 mg total) by mouth at bedtime. Qty: 3 tablet, Refills: 0      CONTINUE these medications which have NOT CHANGED   Details  carbamazepine (TEGRETOL) 200 MG tablet Take 400 mg by mouth 2 (two) times daily. Refills: 5    HYDROcodone-acetaminophen (NORCO) 10-325 MG per tablet Take 1 tablet by mouth every 6 (six) hours as needed for moderate pain.      STOP taking these medications     baclofen (LIORESAL) 20 MG tablet        No Known Allergies    The results of significant diagnostics from this hospitalization (including imaging, microbiology,  ancillary and laboratory) are listed below for reference.    Significant Diagnostic Studies: Ct Head Wo Contrast  08/22/2014   CLINICAL DATA:  Altered mental status, incontinence  EXAM: CT HEAD WITHOUT CONTRAST  TECHNIQUE: Contiguous axial images were obtained from the base of the skull through the vertex without intravenous contrast.  COMPARISON:  04/27/2014  FINDINGS: The bony  calvarium is intact. No soft tissue changes are seen. Mild atrophic changes are noted. No findings to suggest acute hemorrhage, acute infarction or space-occupying mass lesion are noted.  IMPRESSION: Mild atrophic changes without acute abnormality. No change from the prior exam.   Electronically Signed   By: Inez Catalina M.D.   On: 08/22/2014 20:08   Dg Chest Portable 1 View  08/22/2014   CLINICAL DATA:  Confusion.  EXAM: PORTABLE CHEST - 1 VIEW  COMPARISON:  08/21/2010.  FINDINGS: Increased body habitus. Cardiomegaly. Patchy BILATERAL airspace opacities could represent mild vascular congestion, early congestive failure, or early BILATERAL pneumonia. Correlate clinically. Consider PA and lateral chest when stable. Worsening aeration from priors.  IMPRESSION: Patchy BILATERAL airspace opacities with cardiomegaly. See discussion above.   Electronically Signed   By: Rolla Flatten M.D.   On: 08/22/2014 16:43    Microbiology: Recent Results (from the past 240 hour(s))  Urine culture     Status: None   Collection Time: 08/22/14  3:59 PM  Result Value Ref Range Status   Specimen Description URINE, CATHETERIZED  Final   Special Requests NONE  Final   Colony Count   Final    >=100,000 COLONIES/ML Performed at [facility]    Culture   Final    ESCHERICHIA COLI Note: Two isolates with different morphologies were identified as the same organism.The most resistant organism was reported. Performed at [facility]    Report Status 08/26/2014 FINAL  Final   Organism ID, Bacteria ESCHERICHIA COLI  Final      Susceptibility   Escherichia coli - MIC*    AMPICILLIN 8 SENSITIVE Sensitive     CEFAZOLIN <=4 SENSITIVE Sensitive     CEFTRIAXONE <=1 SENSITIVE Sensitive     CIPROFLOXACIN >=4 RESISTANT Resistant     GENTAMICIN <=1 SENSITIVE Sensitive     LEVOFLOXACIN >=8 RESISTANT Resistant     NITROFURANTOIN <=16 SENSITIVE Sensitive     TOBRAMYCIN <=1 SENSITIVE Sensitive     TRIMETH/SULFA  >=320 RESISTANT Resistant     PIP/TAZO <=4 SENSITIVE Sensitive     * ESCHERICHIA COLI     Labs: Basic Metabolic Panel:  Recent Labs Lab 08/22/14 1641 08/23/14 0010 08/23/14 0453 08/25/14 0105  NA 141  --  141 136  K 4.3  --  4.1 4.6  CL 107  --  106 102  CO2 29  --  26 29  GLUCOSE 136*  --  130* 118*  BUN 13  --  9 15  CREATININE 0.82 0.70 0.82 0.90  CALCIUM 9.1  --  9.2 9.2  MG  --   --   --  2.0   Liver Function Tests:  Recent Labs Lab 08/22/14 1641 08/23/14 0453  AST 23 17  ALT 18 18  ALKPHOS 66 68  BILITOT 0.6 0.3  PROT 7.0 7.2  ALBUMIN 3.8 3.8   No results for input(s): LIPASE, AMYLASE in the last 168 hours.  Recent Labs Lab 08/22/14 1641  AMMONIA 47*   CBC:  Recent Labs Lab 08/22/14 1536 08/23/14 0010 08/23/14 0453 08/25/14 0105  WBC 6.5 7.1 8.7 6.7  NEUTROABS  --   --  5.7  --   HGB 13.2 13.6 13.9 12.4  HCT 42.4 44.2 45.0 40.5  MCV 90.8 93.2 93.4 91.6  PLT 167 180 156 143*   Cardiac Enzymes:  Recent Labs Lab 08/22/14 1641 08/25/14 0105 08/25/14 0919  CKTOTAL 164  --   --   TROPONINI  --  <0.03 <0.03   BNP: BNP (last 3 results) No results for input(s): PROBNP in the last 8760 hours. CBG:  Recent Labs Lab 08/24/14 0653 08/24/14 1153 08/24/14 1558 08/24/14 2148 08/25/14 0103  GLUCAP 140* 118* 130* 145* 115*       Signed:  Rashae Rother  [facility] 08/26/2014, 9:45 AM

## 2014-08-26 NOTE — Progress Notes (Signed)
Discharge instructions and medications reviewed with patient. Patient verbalized understanding of changes and when to f/u with PCP. Patient also has a sore place on her right thumb and has puss/greenish colored nail and finger tip. Area wrapped per patient request and patient was highly encouraged to discuss with PCP at f/u appointment. All questions and concerns addressed. IV's discontinued and telemetry removed. Alfredo Bach RN BSN 08/26/2014 1:15 PM.

## 2014-09-14 DIAGNOSIS — M4697 Unspecified inflammatory spondylopathy, lumbosacral region: Secondary | ICD-10-CM | POA: Diagnosis not present

## 2014-09-14 DIAGNOSIS — M25569 Pain in unspecified knee: Secondary | ICD-10-CM | POA: Diagnosis not present

## 2014-09-14 DIAGNOSIS — M1990 Unspecified osteoarthritis, unspecified site: Secondary | ICD-10-CM | POA: Diagnosis not present

## 2014-09-14 DIAGNOSIS — G629 Polyneuropathy, unspecified: Secondary | ICD-10-CM | POA: Diagnosis not present

## 2014-09-15 ENCOUNTER — Other Ambulatory Visit: Payer: Self-pay | Admitting: Neurology

## 2014-09-16 ENCOUNTER — Ambulatory Visit: Payer: Self-pay | Admitting: Neurology

## 2014-09-17 ENCOUNTER — Ambulatory Visit: Payer: Medicare Other | Admitting: Neurology

## 2014-10-19 ENCOUNTER — Emergency Department (HOSPITAL_COMMUNITY): Payer: Medicare Other

## 2014-10-19 ENCOUNTER — Emergency Department (HOSPITAL_COMMUNITY)
Admission: EM | Admit: 2014-10-19 | Discharge: 2014-10-20 | Disposition: A | Discharge status: Home / Self Care | Payer: Medicare Other | Attending: Emergency Medicine | Admitting: Emergency Medicine

## 2014-10-19 ENCOUNTER — Encounter (HOSPITAL_COMMUNITY): Payer: Self-pay | Admitting: Neurology

## 2014-10-19 ENCOUNTER — Other Ambulatory Visit (HOSPITAL_COMMUNITY): Payer: Medicare Other

## 2014-10-19 DIAGNOSIS — Z9981 Dependence on supplemental oxygen: Secondary | ICD-10-CM | POA: Diagnosis not present

## 2014-10-19 DIAGNOSIS — J811 Chronic pulmonary edema: Secondary | ICD-10-CM | POA: Diagnosis not present

## 2014-10-19 DIAGNOSIS — Z8719 Personal history of other diseases of the digestive system: Secondary | ICD-10-CM | POA: Diagnosis not present

## 2014-10-19 DIAGNOSIS — G500 Trigeminal neuralgia: Secondary | ICD-10-CM | POA: Diagnosis not present

## 2014-10-19 DIAGNOSIS — G894 Chronic pain syndrome: Secondary | ICD-10-CM | POA: Diagnosis not present

## 2014-10-19 DIAGNOSIS — M545 Low back pain: Secondary | ICD-10-CM | POA: Diagnosis not present

## 2014-10-19 DIAGNOSIS — A599 Trichomoniasis, unspecified: Secondary | ICD-10-CM

## 2014-10-19 DIAGNOSIS — Z8701 Personal history of pneumonia (recurrent): Secondary | ICD-10-CM | POA: Insufficient documentation

## 2014-10-19 DIAGNOSIS — A5901 Trichomonal vulvovaginitis: Secondary | ICD-10-CM | POA: Insufficient documentation

## 2014-10-19 DIAGNOSIS — G8929 Other chronic pain: Secondary | ICD-10-CM | POA: Insufficient documentation

## 2014-10-19 DIAGNOSIS — Z87891 Personal history of nicotine dependence: Secondary | ICD-10-CM | POA: Diagnosis not present

## 2014-10-19 DIAGNOSIS — R072 Precordial pain: Secondary | ICD-10-CM | POA: Diagnosis not present

## 2014-10-19 DIAGNOSIS — J441 Chronic obstructive pulmonary disease with (acute) exacerbation: Secondary | ICD-10-CM | POA: Insufficient documentation

## 2014-10-19 DIAGNOSIS — R600 Localized edema: Secondary | ICD-10-CM | POA: Diagnosis not present

## 2014-10-19 DIAGNOSIS — M1990 Unspecified osteoarthritis, unspecified site: Secondary | ICD-10-CM | POA: Diagnosis not present

## 2014-10-19 DIAGNOSIS — N390 Urinary tract infection, site not specified: Secondary | ICD-10-CM | POA: Insufficient documentation

## 2014-10-19 DIAGNOSIS — R079 Chest pain, unspecified: Secondary | ICD-10-CM | POA: Diagnosis not present

## 2014-10-19 DIAGNOSIS — M797 Fibromyalgia: Secondary | ICD-10-CM | POA: Diagnosis not present

## 2014-10-19 DIAGNOSIS — R011 Cardiac murmur, unspecified: Secondary | ICD-10-CM | POA: Diagnosis not present

## 2014-10-19 DIAGNOSIS — M7989 Other specified soft tissue disorders: Secondary | ICD-10-CM | POA: Diagnosis not present

## 2014-10-19 DIAGNOSIS — R911 Solitary pulmonary nodule: Secondary | ICD-10-CM | POA: Diagnosis not present

## 2014-10-19 DIAGNOSIS — Z8679 Personal history of other diseases of the circulatory system: Secondary | ICD-10-CM | POA: Diagnosis not present

## 2014-10-19 DIAGNOSIS — Z79899 Other long term (current) drug therapy: Secondary | ICD-10-CM | POA: Insufficient documentation

## 2014-10-19 DIAGNOSIS — J189 Pneumonia, unspecified organism: Secondary | ICD-10-CM

## 2014-10-19 DIAGNOSIS — I517 Cardiomegaly: Secondary | ICD-10-CM | POA: Diagnosis not present

## 2014-10-19 DIAGNOSIS — B9689 Other specified bacterial agents as the cause of diseases classified elsewhere: Secondary | ICD-10-CM | POA: Diagnosis not present

## 2014-10-19 DIAGNOSIS — E669 Obesity, unspecified: Secondary | ICD-10-CM

## 2014-10-19 LAB — I-STAT TROPONIN, ED: Troponin i, poc: 0 ng/mL (ref 0.00–0.08)

## 2014-10-19 LAB — BASIC METABOLIC PANEL
Anion gap: 9 (ref 5–15)
BUN: 14 mg/dL (ref 6–23)
CHLORIDE: 107 mmol/L (ref 96–112)
CO2: 22 mmol/L (ref 19–32)
Calcium: 8.9 mg/dL (ref 8.4–10.5)
Creatinine, Ser: 0.79 mg/dL (ref 0.50–1.10)
GFR calc Af Amer: >90 mL/min (ref >90)
GFR, EST NON AFRICAN AMERICAN: 85 mL/min — AB (ref >90)
Glucose, Bld: 124 mg/dL — ABNORMAL HIGH (ref 70–99)
Potassium: 4.4 mmol/L (ref 3.5–5.1)
SODIUM: 138 mmol/L (ref 135–145)

## 2014-10-19 LAB — BRAIN NATRIURETIC PEPTIDE: B Natriuretic Peptide: 51.8 pg/mL (ref 0.0–100.0)

## 2014-10-19 LAB — CBC
HCT: 37.7 % (ref 36.0–46.0)
Hemoglobin: 11.4 g/dL — ABNORMAL LOW (ref 12.0–15.0)
MCH: 27.5 pg (ref 26.0–34.0)
MCHC: 30.2 g/dL (ref 30.0–36.0)
MCV: 91.1 fL (ref 78.0–100.0)
Platelets: 106 10*3/uL — ABNORMAL LOW (ref 150–400)
RBC: 4.14 MIL/uL (ref 3.87–5.11)
RDW: 14.6 % (ref 11.5–15.5)
WBC: 6.9 10*3/uL (ref 4.0–10.5)

## 2014-10-19 LAB — TROPONIN I: Troponin I: <0.03 ng/mL (ref <0.031)

## 2014-10-19 MED ORDER — ASPIRIN 81 MG PO CHEW
324.0000 mg | CHEWABLE_TABLET | Freq: Once | ORAL | Status: AC
  Administered 2014-10-19: 324 mg via ORAL
  Filled 2014-10-19: qty 4

## 2014-10-19 MED ORDER — MORPHINE SULFATE 4 MG/ML IJ SOLN
4.0000 mg | Freq: Once | INTRAMUSCULAR | Status: AC
  Administered 2014-10-19: 4 mg via INTRAVENOUS
  Filled 2014-10-19: qty 1

## 2014-10-19 MED ORDER — IOHEXOL 350 MG/ML SOLN
100.0000 mL | Freq: Once | INTRAVENOUS | Status: AC | PRN
  Administered 2014-10-19: 100 mL via INTRAVENOUS

## 2014-10-19 NOTE — ED Notes (Signed)
Pt returned from CT °

## 2014-10-19 NOTE — ED Provider Notes (Signed)
CSN: [id number]     Arrival date & time 10/19/14  1702 History   First MD Initiated Contact with Patient 10/19/14 1719     Chief Complaint  Patient presents with  . Chest Pain     (Consider location/radiation/quality/duration/timing/severity/associated sxs/prior Treatment) HPI Comments: Patient with a history of COPD, GERD, Morbid Obesity, PVD, and Hyperlipidemia, presents today with a chief complaint of chest pain and SOB.  She reports that the chest pain has been intermittent over the past 2 days.  She denies any association with exertion or eating.  She reports that the chest pain has significantly eased up at this time.  She describes the pain as an "elephant sitting on my chest."  Pain located in the substernal area and radiation to the right shoulder.  She reports an associated productive cough for the past 1-1.5 weeks, which is gradually worsening.  She denies fever, chills, hemoptysis. nausea, vomiting, numbness, tingling, abdominal pain.  No new LE edema.  She states that her symptoms feel similar to symptoms that she has had in the past with Pneumonia.  She has not taken anything for symptoms prior to arrival.    Patient states that she is on CPAP sixteen hours a day while at home.    The history is provided by the patient.    Past Medical History  Diagnosis Date  . Heart murmur   . Headache(784.0)   . COPD (chronic obstructive pulmonary disease)   . GERD (gastroesophageal reflux disease)   . PVD (peripheral vascular disease)   . Hyperlipidemia   . Cervicalgia   . Migraine without aura, with intractable migraine, so stated, without mention of status migrainosus   . PVD (peripheral vascular disease)   . Osteoarthritis   . Chronic back pain   . Trigeminal neuralgia    Past Surgical History  Procedure Laterality Date  . Dental surgery     Family History  Problem Relation Age of Onset  . Heart attack Father   . Heart disease    . Diabetes    . Diabetes Mother   .  Diabetes Brother    History  Substance Use Topics  . Smoking status: Former Smoker    Types: Cigarettes    Quit date: 08/22/2010  . Smokeless tobacco: Never Used  . Alcohol Use: No   OB History    No data available     Review of Systems  All other systems reviewed and are negative.     Allergies  Review of patient's allergies indicates no known allergies.  Home Medications   Prior to Admission medications   Medication Sig Start Date End Date Taking? Authorizing Provider  azithromycin (ZITHROMAX) 500 MG tablet Take 1 tablet (500 mg total) by mouth at bedtime. 08/26/14   Geradine Girt, DO  carbamazepine (TEGRETOL) 200 MG tablet Take 400 mg by mouth 2 (two) times daily. 07/26/14   Historical Provider, MD  carbamazepine (TEGRETOL) 200 MG tablet TAKE 2 TABLETS (400 MG TOTAL) BY MOUTH 2 (TWO) TIMES DAILY. 09/15/14   Antony Contras, MD  HYDROcodone-acetaminophen (NORCO) 10-325 MG per tablet Take 1 tablet by mouth every 6 (six) hours as needed for moderate pain.    Historical Provider, MD   BP 152/99 mmHg  Pulse 93  Temp(Src) 98.7 F (37.1 C)  Resp 22  SpO2 93% Physical Exam  Constitutional: She appears well-developed and well-nourished.  HENT:  Head: Normocephalic and atraumatic.  Mouth/Throat: Oropharynx is clear and moist.  Neck: Normal range of  motion. Neck supple.  Cardiovascular: Normal rate, regular rhythm and normal heart sounds.   Pulmonary/Chest: Effort normal and breath sounds normal.  Abdominal: Soft. Bowel sounds are normal. She exhibits no distension. There is no tenderness. There is no rebound and no guarding.  Musculoskeletal: Normal range of motion.  2+ pitting edema of lower extremities bilaterally.   Left >right.  Neurological: She is alert.  Skin: Skin is warm and dry.  Psychiatric: She has a normal mood and affect.  Nursing note and vitals reviewed.   ED Course  Procedures (including critical care time) Labs Review Labs Reviewed  CBC  BASIC  METABOLIC PANEL  I-STAT [place], ED    Imaging Review Ct Angio Chest Pe W/cm &/or Wo Cm  10/19/2014   CLINICAL DATA:  Chest pressure beginning two nights ago, shortness of breath, cough, upper back pain. No history pulmonary embolism. History of heart murmur, chronic back pain, COPD.  EXAM: CT ANGIOGRAPHY CHEST WITH CONTRAST  TECHNIQUE: Multidetector CT imaging of the chest was performed using the standard protocol during bolus administration of intravenous contrast. Multiplanar CT image reconstructions and MIPs were obtained to evaluate the vascular anatomy.  CONTRAST:  143mL OMNIPAQUE IOHEXOL 350 MG/ML SOLN  COMPARISON:  Chest radiograph October 19, 2014 at 1811 hours and CT of the chest August 22, 2010  FINDINGS: PULMONARY ARTERY: Adequate contrast opacification of the pulmonary artery's. Main pulmonary artery is enlarged, 4.1 cm in transaxial dimension. No pulmonary arterial filling defects to the level of the subsegmental branches.  MEDIASTINUM: Heart size is moderately enlarged, no right heart strain. No pericardial fluid collections. Thoracic aorta is normal course and caliber, mild calcific atherosclerosis. Pulmonary vascular congestion. No lymphadenopathy by CT size criteria.  LUNGS: Tracheobronchial tree is patent, no pneumothorax. No pleural effusions, focal consolidations, pulmonary nodules or masses.  SOFT TISSUES AND OSSEOUS STRUCTURES: Included view of the abdomen demonstrates apparent nephrolithiasis and air in the included LEFT kidney. Large body habitus. Visualized soft tissues and included osseous structures appear non acute ; 2 cm nodule RIGHT breast.  Review of the MIP images confirms the above findings.  IMPRESSION: No acute pulmonary embolism.  Moderate cardiomegaly and pulmonary vascular congestion.  Included view of the abdomen demonstrates air and apparent nephrolithiasis in partially imaged LEFT kidney. Recommend correlation with urinary analysis.  2 cm RIGHT breast nodule for  which follow-up mammogram is recommended on a nonemergent basis.  Acute findings discussed with and reconfirmed by Dr.Luella Gardenhire on 10/19/2014 at 10:22 pm.   Electronically Signed   By: Elon Alas   On: 10/19/2014 22:23     EKG Interpretation   Date/Time:  Tuesday October 19 2014 17:06:53 EST Ventricular Rate:  92 PR Interval:  198 QRS Duration: 92 QT Interval:  362 QTC Calculation: 447 R Axis:   -31 Text Interpretation:  Normal sinus rhythm Left axis deviation Abnormal ECG  agree. no change from old. Confirmed by Johnney Killian, MD, Jeannie Done [phone]) on  10/19/2014 8:09:21 PM       10: 25 PM Discussed CT angio results with the radiologist.  She states that there is air in the left kidney and apparent nephrolithiasis.  She states that the air can be seen with Pyelonephritis.   10:30 PM Discussed CT results with the patient.  Initially she did not have any urinary complaints so a UA was not ordered.  However, she now states that she has had some dysuria and a strong odor to the urine.  Will order UA. 1:15 PM UA  showing a UTI.  Urine cultured.  No hemoglobin in the Urine.  No flank pain.  Therefore, doubt obstructing kidney stones.  Urine showing Trich.  Discussed with Dr. Colvin Caroli.  Will order Rocephin and Azithromycin and also give patient Rx for Flagyl.  She denies any pelvic pain or vaginal discharge.   1:29 AM Reassessed patient.  Chest pain has improved.  Pulse ox 93-95 on RA. MDM   Final diagnoses:  None   Patient presents today with chest pain.  She reports that the pain has been intermittent over the past couple of days.  Pain associated with some SOB.  CXR showing stable cardiomegaly and pulmonary vascular congestion.  No ischemic changes on EKG.  Troponin negative.  CT angio chest results as above negative for PE.  Doppler LE negative for DVT.  Patient not hypoxic on RA at time of discharge.  She has CPAP at home that she is on for 16 hours a day.  Urine did show a UTI.  Urine cultured.  Patient treated with 10 day course of Keflex.  Patient also evaluated by Dr. Johnney Killian.  Patient instructed to take Protonix.  Suspect that chest pain may be related to GI etiology.  Patient instructed to follow up with her PCP.  Return precautions given.      Hyman Bible, PA-C 10/21/14 2144  Hyman Bible, PA-C 10/21/14 2145  Charlesetta Shanks, MD 10/28/14 [phone]

## 2014-10-19 NOTE — ED Notes (Signed)
Lab notified to add on BNP 

## 2014-10-19 NOTE — ED Notes (Signed)
Call [facility] @ [phone] when pt ready for discharge and/or admit.

## 2014-10-19 NOTE — ED Notes (Signed)
Pt reports cp and pain in upper back for 2 days. Had regular scheduled appointment today, sent here for evaluation of CP and feeling SOB, oxygen was 89% RA in office. Was recently treated for pneumonia. Pt is a x 4

## 2014-10-19 NOTE — Progress Notes (Signed)
VASCULAR LAB PRELIMINARY  PRELIMINARY  PRELIMINARY  PRELIMINARY  Bilateral lower extremity venous duplex completed.    Preliminary report:  Bilateral:  No evidence of DVT, superficial thrombosis, or Baker's Cyst.   Pauline Pegues, RVS 10/19/2014, 7:54 PM

## 2014-10-20 DIAGNOSIS — R079 Chest pain, unspecified: Secondary | ICD-10-CM | POA: Diagnosis not present

## 2014-10-20 LAB — URINALYSIS, ROUTINE W REFLEX MICROSCOPIC
Bilirubin Urine: NEGATIVE
Glucose, UA: NEGATIVE mg/dL
Hgb urine dipstick: NEGATIVE
Ketones, ur: NEGATIVE mg/dL
Nitrite: POSITIVE — AB
PROTEIN: NEGATIVE mg/dL
Specific Gravity, Urine: >1.046 — ABNORMAL HIGH (ref 1.005–1.030)
UROBILINOGEN UA: 1 mg/dL (ref 0.0–1.0)
pH: 7.5 (ref 5.0–8.0)

## 2014-10-20 LAB — URINE MICROSCOPIC-ADD ON

## 2014-10-20 MED ORDER — LIDOCAINE HCL (PF) 1 % IJ SOLN
0.9000 mL | Freq: Once | INTRAMUSCULAR | Status: AC
  Administered 2014-10-20: 0.9 mL

## 2014-10-20 MED ORDER — METRONIDAZOLE 500 MG PO TABS: 500.0000 mg | ORAL_TABLET | Freq: Two times a day (BID) | ORAL | Status: DC

## 2014-10-20 MED ORDER — CEFTRIAXONE SODIUM 250 MG IJ SOLR
250.0000 mg | Freq: Once | INTRAMUSCULAR | Status: AC
  Administered 2014-10-20: 250 mg via INTRAMUSCULAR
  Filled 2014-10-20: qty 250

## 2014-10-20 MED ORDER — AZITHROMYCIN 250 MG PO TABS
1000.0000 mg | ORAL_TABLET | Freq: Once | ORAL | Status: AC
  Administered 2014-10-20: 1000 mg via ORAL
  Filled 2014-10-20: qty 4

## 2014-10-20 MED ORDER — CEPHALEXIN 500 MG PO CAPS: 500.0000 mg | ORAL_CAPSULE | Freq: Three times a day (TID) | ORAL | Status: DC

## 2014-10-20 MED ORDER — LIDOCAINE HCL (PF) 1 % IJ SOLN
INTRAMUSCULAR | Status: AC
Start: 2014-10-20 — End: 2014-10-20
  Filled 2014-10-20: qty 5

## 2014-10-20 NOTE — ED Notes (Signed)
Pt forgot her prescription at the ED, pt was called and she answer the phone. She was informed that she need to come pick her prescription and start taking them today. She verbalize understanding and states she will come pick them up.

## 2014-10-23 LAB — URINE CULTURE: Colony Count: >=100000

## 2014-10-25 ENCOUNTER — Telehealth (HOSPITAL_COMMUNITY): Payer: Self-pay

## 2014-10-25 NOTE — Telephone Encounter (Signed)
Post ED Visit - Positive Culture Follow-up  Culture report reviewed by antimicrobial stewardship pharmacist: []  Wes Dulaney, Pharm.D., BCPS []  Heide Guile, Pharm.D., BCPS [x]  Alycia Rossetti, Pharm.D., BCPS []  [place], Pharm.D., BCPS, AAHIVP []  Legrand Como, Pharm.D., BCPS, AAHIVP []  Isac Sarna, Pharm.D., BCPS  Positive Urine culture, >/= 100,000 colonies -> E Coli Treated with Cephalexin, organism sensitive to the same and no further patient follow-up is required at this time.  Dortha Kern 10/25/2014, 5:31 AM

## 2014-10-26 ENCOUNTER — Telehealth: Payer: Self-pay | Admitting: Internal Medicine

## 2014-10-26 NOTE — Telephone Encounter (Signed)
yes

## 2014-10-26 NOTE — Telephone Encounter (Signed)
Pt called in and wanted to know if you would take her back on as a pt.

## 2014-10-28 ENCOUNTER — Emergency Department (HOSPITAL_COMMUNITY): Payer: Medicare Other

## 2014-10-28 ENCOUNTER — Observation Stay (HOSPITAL_COMMUNITY)
Admission: EM | Admit: 2014-10-28 | Discharge: 2014-10-30 | Disposition: A | Discharge status: Home / Self Care | Payer: Medicare Other | Attending: Internal Medicine | Admitting: Internal Medicine

## 2014-10-28 ENCOUNTER — Encounter (HOSPITAL_COMMUNITY): Payer: Self-pay

## 2014-10-28 DIAGNOSIS — M1990 Unspecified osteoarthritis, unspecified site: Secondary | ICD-10-CM | POA: Diagnosis not present

## 2014-10-28 DIAGNOSIS — G8929 Other chronic pain: Secondary | ICD-10-CM | POA: Insufficient documentation

## 2014-10-28 DIAGNOSIS — I739 Peripheral vascular disease, unspecified: Secondary | ICD-10-CM | POA: Diagnosis not present

## 2014-10-28 DIAGNOSIS — R079 Chest pain, unspecified: Secondary | ICD-10-CM | POA: Diagnosis not present

## 2014-10-28 DIAGNOSIS — K219 Gastro-esophageal reflux disease without esophagitis: Secondary | ICD-10-CM | POA: Diagnosis not present

## 2014-10-28 DIAGNOSIS — Z6841 Body Mass Index (BMI) 40.0 and over, adult: Secondary | ICD-10-CM | POA: Insufficient documentation

## 2014-10-28 DIAGNOSIS — R1011 Right upper quadrant pain: Secondary | ICD-10-CM

## 2014-10-28 DIAGNOSIS — Z23 Encounter for immunization: Secondary | ICD-10-CM | POA: Diagnosis not present

## 2014-10-28 DIAGNOSIS — E785 Hyperlipidemia, unspecified: Secondary | ICD-10-CM | POA: Diagnosis not present

## 2014-10-28 DIAGNOSIS — R0602 Shortness of breath: Secondary | ICD-10-CM | POA: Insufficient documentation

## 2014-10-28 DIAGNOSIS — J449 Chronic obstructive pulmonary disease, unspecified: Secondary | ICD-10-CM | POA: Insufficient documentation

## 2014-10-28 DIAGNOSIS — K8020 Calculus of gallbladder without cholecystitis without obstruction: Secondary | ICD-10-CM | POA: Diagnosis not present

## 2014-10-28 DIAGNOSIS — M549 Dorsalgia, unspecified: Secondary | ICD-10-CM | POA: Insufficient documentation

## 2014-10-28 DIAGNOSIS — Z87891 Personal history of nicotine dependence: Secondary | ICD-10-CM | POA: Diagnosis not present

## 2014-10-28 DIAGNOSIS — R0789 Other chest pain: Principal | ICD-10-CM | POA: Insufficient documentation

## 2014-10-28 DIAGNOSIS — K760 Fatty (change of) liver, not elsewhere classified: Secondary | ICD-10-CM | POA: Diagnosis not present

## 2014-10-28 HISTORY — DX: Reserved for inherently not codable concepts without codable children: IMO0001

## 2014-10-28 LAB — I-STAT TROPONIN, ED
TROPONIN I, POC: 0.01 ng/mL (ref 0.00–0.08)
Troponin i, poc: 0.04 ng/mL (ref 0.00–0.08)

## 2014-10-28 LAB — BASIC METABOLIC PANEL
Anion gap: 6 (ref 5–15)
BUN: 11 mg/dL (ref 6–23)
CHLORIDE: 102 mmol/L (ref 96–112)
CO2: 27 mmol/L (ref 19–32)
Calcium: 8.9 mg/dL (ref 8.4–10.5)
Creatinine, Ser: 0.88 mg/dL (ref 0.50–1.10)
GFR calc non Af Amer: 67 mL/min — ABNORMAL LOW (ref >90)
GFR, EST AFRICAN AMERICAN: 78 mL/min — AB (ref >90)
GLUCOSE: 113 mg/dL — AB (ref 70–99)
Potassium: 3.7 mmol/L (ref 3.5–5.1)
Sodium: 135 mmol/L (ref 135–145)

## 2014-10-28 LAB — CBC
HCT: 43.2 % (ref 36.0–46.0)
Hemoglobin: 13.8 g/dL (ref 12.0–15.0)
MCH: 29.2 pg (ref 26.0–34.0)
MCHC: 31.9 g/dL (ref 30.0–36.0)
MCV: 91.3 fL (ref 78.0–100.0)
PLATELETS: 153 10*3/uL (ref 150–400)
RBC: 4.73 MIL/uL (ref 3.87–5.11)
RDW: 14.5 % (ref 11.5–15.5)
WBC: 7.1 10*3/uL (ref 4.0–10.5)

## 2014-10-28 LAB — URINALYSIS, ROUTINE W REFLEX MICROSCOPIC
BILIRUBIN URINE: NEGATIVE
GLUCOSE, UA: NEGATIVE mg/dL
HGB URINE DIPSTICK: NEGATIVE
KETONES UR: NEGATIVE mg/dL
Nitrite: NEGATIVE
PROTEIN: NEGATIVE mg/dL
SPECIFIC GRAVITY, URINE: 1.022 (ref 1.005–1.030)
UROBILINOGEN UA: 0.2 mg/dL (ref 0.0–1.0)
pH: 5.5 (ref 5.0–8.0)

## 2014-10-28 LAB — HEPATIC FUNCTION PANEL
ALT: 17 U/L (ref 0–35)
AST: 18 U/L (ref 0–37)
Albumin: 4 g/dL (ref 3.5–5.2)
Alkaline Phosphatase: 58 U/L (ref 39–117)
BILIRUBIN TOTAL: 0.7 mg/dL (ref 0.3–1.2)
Bilirubin, Direct: 0.1 mg/dL (ref 0.0–0.5)
Indirect Bilirubin: 0.6 mg/dL (ref 0.3–0.9)
Total Protein: 7.3 g/dL (ref 6.0–8.3)

## 2014-10-28 LAB — LIPID PANEL
CHOL/HDL RATIO: 3.5 ratio
Cholesterol: 182 mg/dL (ref 0–200)
HDL: 52 mg/dL (ref >39)
LDL CALC: 108 mg/dL — AB (ref 0–99)
TRIGLYCERIDES: 109 mg/dL (ref <150)
VLDL: 22 mg/dL (ref 0–40)

## 2014-10-28 LAB — URINE MICROSCOPIC-ADD ON

## 2014-10-28 LAB — D-DIMER, QUANTITATIVE: D-Dimer, Quant: <0.27 ug/mL-FEU (ref 0.00–0.48)

## 2014-10-28 LAB — BRAIN NATRIURETIC PEPTIDE: B Natriuretic Peptide: 39.4 pg/mL (ref 0.0–100.0)

## 2014-10-28 LAB — TROPONIN I

## 2014-10-28 LAB — LIPASE, BLOOD: LIPASE: 23 U/L (ref 11–59)

## 2014-10-28 LAB — TSH: TSH: 4.212 u[IU]/mL (ref 0.350–4.500)

## 2014-10-28 MED ORDER — ONDANSETRON HCL 4 MG/2ML IJ SOLN: 4.0000 mg | Freq: Four times a day (QID) | INTRAMUSCULAR | Status: DC | PRN

## 2014-10-28 MED ORDER — ASPIRIN EC 325 MG PO TBEC
325.0000 mg | DELAYED_RELEASE_TABLET | Freq: Every day | ORAL | Status: DC
  Administered 2014-10-28 – 2014-10-30 (×3): 325 mg via ORAL
  Filled 2014-10-28 (×3): qty 1

## 2014-10-28 MED ORDER — ACETAMINOPHEN 325 MG PO TABS
650.0000 mg | ORAL_TABLET | ORAL | Status: DC | PRN
  Administered 2014-10-29 – 2014-10-30 (×2): 650 mg via ORAL
  Filled 2014-10-28 (×2): qty 2

## 2014-10-28 MED ORDER — NITROGLYCERIN 0.4 MG SL SUBL: 0.4000 mg | SUBLINGUAL_TABLET | SUBLINGUAL | Status: DC | PRN

## 2014-10-28 MED ORDER — MORPHINE SULFATE 2 MG/ML IJ SOLN: 2.0000 mg | INTRAMUSCULAR | Status: DC | PRN

## 2014-10-28 MED ORDER — ASPIRIN 81 MG PO CHEW
324.0000 mg | CHEWABLE_TABLET | Freq: Once | ORAL | Status: AC
  Administered 2014-10-28: 324 mg via ORAL
  Filled 2014-10-28: qty 4

## 2014-10-28 MED ORDER — ACETAMINOPHEN 500 MG PO TABS
1000.0000 mg | ORAL_TABLET | Freq: Once | ORAL | Status: AC
  Administered 2014-10-28: 1000 mg via ORAL
  Filled 2014-10-28: qty 2

## 2014-10-28 MED ORDER — PANTOPRAZOLE SODIUM 40 MG PO TBEC
40.0000 mg | DELAYED_RELEASE_TABLET | Freq: Every day | ORAL | Status: DC
  Administered 2014-10-28 – 2014-10-30 (×3): 40 mg via ORAL
  Filled 2014-10-28 (×2): qty 1

## 2014-10-28 MED ORDER — HEPARIN SODIUM (PORCINE) 5000 UNIT/ML IJ SOLN
5000.0000 [IU] | Freq: Three times a day (TID) | INTRAMUSCULAR | Status: DC
  Administered 2014-10-28 – 2014-10-30 (×4): 5000 [IU] via SUBCUTANEOUS
  Filled 2014-10-28 (×6): qty 1

## 2014-10-28 NOTE — ED Provider Notes (Signed)
CSN: [id number]     Arrival date & time 10/28/14  1324 History   First MD Initiated Contact with Patient 10/28/14 1546     Chief Complaint  Patient presents with  . Chest Pain     (Consider location/radiation/quality/duration/timing/severity/associated sxs/prior Treatment) The history is provided by the patient.    69 yo F with PMHx of COPD, GERD, PVD, HLD, chronic back pain who presents with sharp, right-sided chest pain that woke her up from sleep. Pt states she has had intermittent CP x 1 week with associated SOB. She was seen here on 2/23 for these sx with negative CTA PE and DVT studies. She felt "okay" going home but had intermittent CP over the last week. This AM, she awoke abruptly at 3 AM from sharp, right-sided chest pain. She had associated tingling and pain down her left arm as well as SOB and nausea. She has also noticed epigastric abdominal pain and R flank pain over the same time period with mild urinary frequency, but no overt dysuria. Of note, she was diagnosed with UTI on 2/23 and took 4 days of Keflex for UTI, but states she stopped due to diarrhea associated with the Keflex. She believes this is all related to her diagnosis of legionella pneumonia several months ago, which has just left her "in a mess." No known fevers but she has had chills. No RUQ pain.  Past Medical History  Diagnosis Date  . Heart murmur   . Headache(784.0)   . COPD (chronic obstructive pulmonary disease)   . GERD (gastroesophageal reflux disease)   . PVD (peripheral vascular disease)   . Hyperlipidemia   . Cervicalgia   . Migraine without aura, with intractable migraine, so stated, without mention of status migrainosus   . PVD (peripheral vascular disease)   . Osteoarthritis   . Chronic back pain   . Trigeminal neuralgia    Past Surgical History  Procedure Laterality Date  . Dental surgery     Family History  Problem Relation Age of Onset  . Heart attack Father   . Heart disease    .  Diabetes    . Diabetes Mother   . Diabetes Brother    History  Substance Use Topics  . Smoking status: Former Smoker    Types: Cigarettes    Quit date: 08/22/2010  . Smokeless tobacco: Never Used  . Alcohol Use: No   OB History    No data available     Review of Systems  Constitutional: Positive for chills and fatigue. Negative for fever.  HENT: Negative for congestion, rhinorrhea and sore throat.   Respiratory: Positive for cough, chest tightness and shortness of breath.   Cardiovascular: Positive for chest pain. Negative for leg swelling.  Gastrointestinal: Positive for nausea, abdominal pain and diarrhea. Negative for vomiting.  Genitourinary: Positive for frequency and flank pain. Negative for dysuria.  Musculoskeletal: Negative for neck pain and neck stiffness.  Skin: Negative for rash.  Neurological: Negative for dizziness, syncope, weakness and headaches.  All other systems reviewed and are negative.     Allergies  Review of patient's allergies indicates no known allergies.  Home Medications   Prior to Admission medications   Medication Sig Start Date End Date Taking? Authorizing Provider  azithromycin (ZITHROMAX) 500 MG tablet Take 1 tablet (500 mg total) by mouth at bedtime. Patient not taking: Reported on 10/19/2014 08/26/14   Geradine Girt, DO  carbamazepine (TEGRETOL) 200 MG tablet TAKE 2 TABLETS (400 MG TOTAL) BY  MOUTH 2 (TWO) TIMES DAILY. 09/15/14   Antony Contras, MD  cephALEXin (KEFLEX) 500 MG capsule Take 1 capsule (500 mg total) by mouth 3 (three) times daily. 10/20/14   Hyman Bible, PA-C  HYDROcodone-acetaminophen (NORCO) 10-325 MG per tablet Take 1 tablet by mouth every 6 (six) hours as needed for moderate pain.    Historical Provider, MD  metroNIDAZOLE (FLAGYL) 500 MG tablet Take 1 tablet (500 mg total) by mouth 2 (two) times daily. 10/20/14   Heather Laisure, PA-C   BP 139/78 mmHg  Pulse 64  Temp(Src) 98.5 F (36.9 C) (Oral)  Resp 20  Ht 5\' 5"   (1.651 m)  Wt 334 lb (151.501 kg)  BMI 55.58 kg/m2  SpO2 93% Physical Exam  Constitutional: She is oriented to person, place, and time. She appears well-developed and well-nourished. No distress.  Obese  HENT:  Head: Normocephalic and atraumatic.  Mouth/Throat: No oropharyngeal exudate.  Eyes: Conjunctivae are normal. Pupils are equal, round, and reactive to light.  Neck: Normal range of motion. Neck supple. No JVD present.  Cardiovascular: Normal rate, regular rhythm, normal heart sounds and intact distal pulses.  Exam reveals no friction rub.   No murmur heard. Pulmonary/Chest: Effort normal and breath sounds normal. No respiratory distress. She has no wheezes. She has no rales.  Abdominal: Soft. Bowel sounds are normal. She exhibits no distension. There is no tenderness. There is no rebound and no guarding.  Musculoskeletal: She exhibits no edema.  Neurological: She is oriented to person, place, and time.  Skin: Skin is warm. No rash noted.  Nursing note and vitals reviewed.   ED Course  Procedures (including critical care time) Labs Review Labs Reviewed  BASIC METABOLIC PANEL - Abnormal; Notable for the following:    Glucose, Bld 113 (*)    GFR calc non Af Amer 67 (*)    GFR calc Af Amer 78 (*)    All other components within normal limits  URINALYSIS, ROUTINE W REFLEX MICROSCOPIC - Abnormal; Notable for the following:    Leukocytes, UA SMALL (*)    All other components within normal limits  URINE MICROSCOPIC-ADD ON - Abnormal; Notable for the following:    Squamous Epithelial / LPF FEW (*)    All other components within normal limits  LIPID PANEL - Abnormal; Notable for the following:    LDL Cholesterol 108 (*)    All other components within normal limits  CBC  HEPATIC FUNCTION PANEL  LIPASE, BLOOD  D-DIMER, QUANTITATIVE  TROPONIN I  BRAIN NATRIURETIC PEPTIDE  TSH  TROPONIN I  TROPONIN I  COMPREHENSIVE METABOLIC PANEL  CBC WITH DIFFERENTIAL/PLATELET  HEMOGLOBIN  A1C  I-STAT [place], ED  [facility], ED    Imaging Review Dg Chest 2 View  10/28/2014   CLINICAL DATA:  69 year old female with chest pain and shortness of breath for 1 day.  EXAM: CHEST  2 VIEW  COMPARISON:  Chest x-ray 10/19/2014.  FINDINGS: Lung volumes are normal. No consolidative airspace disease. No pleural effusions. No evidence of pulmonary edema. Pulmonary venous congestion. Mild cardiomegaly. Upper mediastinal contours are within normal limits. Atherosclerosis in the thoracic aorta.  IMPRESSION: 1. Mild cardiomegaly with pulmonary venous congestion, but no frank pulmonary edema. 2. Atherosclerosis.   Electronically Signed   By: Vinnie Langton M.D.   On: 10/28/2014 14:55   US Abdomen Limited Ruq  10/28/2014   CLINICAL DATA:  Right upper quadrant pain  EXAM: US ABDOMEN LIMITED - RIGHT UPPER QUADRANT  COMPARISON:  None.  FINDINGS: Gallbladder:  Large 2 cm gallstone is noted without complicating factors.  Common bile duct:  Diameter: 4.9 mm.  Liver:  Increased echogenicity is noted consistent with fatty infiltration.  IMPRESSION: Fatty liver.  Cholelithiasis without complicating factors.   Electronically Signed   By: Inez Catalina M.D.   On: 10/28/2014 20:42     EKG Interpretation   Date/Time:  Thursday October 28 2014 13:29:16 EST Ventricular Rate:  70 PR Interval:  224 QRS Duration: 92 QT Interval:  390 QTC Calculation: 421 R Axis:   -38 Text Interpretation:  Sinus rhythm with 1st degree A-V block with Fusion  complexes Left axis deviation Abnormal ECG No significant change was found  Confirmed by Wyvonnia Dusky  MD, STEPHEN [phone]) on 10/28/2014 3:51:38 PM      MDM   69 yo F with PMHx of COPD, GERD, PVD, HLD, chronic back pain who presents with sharp, right-sided chest pain with SOB. See HPI above. On arrival, T 98.41F, HR 64, RR 20, BP 139/78, satting 93% on RA. Exam as above, pt is overall well-appearing and in NAD.   Pt's presentation is most c/w atypical CP, though she does  have some typical components with multiple risk factors and HEAR score 5. EKG shows no acute ischemia changes. DDx includes PE, though less likely given negative PE study on 2/25. Will send D-Dimer. She does have some RUQ TTP and will send for RUQ U/S as well to eval for GB disease. Otherwise, no fever, cough, sputum production or s/s PNA. Will f/u CXR. Pulses symmetric, pain is reproducible and anterior, and do not suspect dissection at this time.   CBC with WBC 7.1, Hgb 13.8, Plt 153. BMP unremarkable. Troponin 0.01. UA without signs of UTI and do not suspect pyelo or stone. CXR clear. RUQ U/S negative. Pt continues to have CP and delta trop is 0.04. Given risk factors, will admit to Medicine for further work-up.  Clinical Impression: 1. Chest pain, unspecified chest pain type   2. RUQ pain   3. Chest pain     Disposition: Admit  Condition: Stable  Pt seen in conjunction with Dr. Everitt Amber, MD 10/29/14 [place], MD 10/30/14 1031

## 2014-10-28 NOTE — ED Notes (Signed)
Pt states she started having sharp cp this morning that woke her up. Pain on the right side of chest and goes through to her back and shoulder blades. Reports some SOB.

## 2014-10-28 NOTE — H&P (Signed)
Hospitalist Admission History and Physical  Patient name: Janice Morrow Medical record number: [id number] Date of birth: [birth year] Age: 69 y.o. Gender: female  Primary Care Provider: [name], MD  Chief Complaint: chest pain  History of Present Illness:This is a 69 y.o. year old female with significant past medical history of morbid obesity, COPD, GERD, PVD presenting with chest pain. Patient reports having sudden onset of chest pain earlier this morning at work. Patient states chest pain was central in nature with radiation to the left side of her shoulder. Minimal/mild diaphoresis. Symptoms have been intermittent throughout the day. Reports similar episodes associated with indigestion per patient. No alleviating or aggravating factors. Symptoms have persisted throughout the course of the day. Reports also having some diarrhea secondary to recent antibiotic use. Presented to the ER temperature 98.5, heart rate 50s to 70s, respirations tens to 20s, blood pressure in the 110s to 170s, satting 92% on room air. CBC and be met within normal limits. D dimer WNL. Chest x-ray shows mild pulmonary vascular congestion. Abdominal ultrasound shows cholelithiasis without acute obstruction or gallbladder dilatation. I-STAT troponin negative 2. EKG sinus rhythm with first-degree AV block. Denies any hx/o heart attack in the past. Does report prior hx/o pericarditis in the remote past.   Heart Score 4-5 Assessment and Plan: Janice Morrow is a 69 y.o. year old female presenting with Chest pain  Active Problems:   Chest pain   1- Chest Pain -fairly typical sxs in pt w/ multiple CV risk factors including age, obesity, LD -istat trop neg x2, EKG NSR  -cycle CEs  -risk stratification labs -2d ECHO -cards consult given prior hx/o pericarditis  -full dose ASA  2-COPD -stable  -follow  3- GERD  -stable  -may be possible contributor to above  -PPI   FEN/GI: heart healthy  Prophylaxis: sub q  heparin  Disposition: pending further evaluation  Code Status:Full Code    Patient Active Problem List   Diagnosis Date Noted  . Chest pain 10/28/2014  . UTI (lower urinary tract infection) 08/22/2014  . CAP (community acquired pneumonia) 08/22/2014  . Altered mental status 08/22/2014  . Chronic back pain 11/26/2013  . Trigeminal neuralgia 11/26/2013  . HYPERLIPIDEMIA 09/01/2010  . OBESITY 09/01/2010  . CORONARY ARTERY DISEASE 09/01/2010  . PERIPHERAL VASCULAR DISEASE 09/01/2010  . COPD 09/01/2010  . GERD 09/01/2010  . OSTEOARTHRITIS 09/01/2010  . CATARACT, CONGENITAL, LEFT 09/01/2010  . HEADACHE 09/01/2010   Past Medical History: Past Medical History  Diagnosis Date  . Heart murmur   . Headache(784.0)   . COPD (chronic obstructive pulmonary disease)   . GERD (gastroesophageal reflux disease)   . PVD (peripheral vascular disease)   . Hyperlipidemia   . Cervicalgia   . Migraine without aura, with intractable migraine, so stated, without mention of status migrainosus   . PVD (peripheral vascular disease)   . Osteoarthritis   . Chronic back pain   . Trigeminal neuralgia     Past Surgical History: Past Surgical History  Procedure Laterality Date  . Dental surgery      Social History: History   Social History  . Marital Status: Single    Spouse Name: N/A  . Number of Children: 1  . Years of Education: 12th   Occupational History  . DISABLED    Social History Main Topics  . Smoking status: Former Smoker    Types: Cigarettes    Quit date: 08/22/2010  . Smokeless tobacco: Never Used  . Alcohol Use:  No  . Drug Use: No  . Sexual Activity: No   Other Topics Concern  . None   Social History Narrative   Patient lives at home with sister.   Caffeine Use: 5-6 20oz bottle sodas daily    Family History: Family History  Problem Relation Age of Onset  . Heart attack Father   . Heart disease    . Diabetes    . Diabetes Mother   . Diabetes Brother      Allergies: Allergies  Allergen Reactions  . Baclofen Other (See Comments)    Causes seizures    Current Facility-Administered Medications  Medication Dose Route Frequency Provider Last Rate Last Dose  . acetaminophen (TYLENOL) tablet 650 mg  650 mg Oral Q4H PRN Shanda Howells, MD      . aspirin EC tablet 325 mg  325 mg Oral Daily Shanda Howells, MD      . heparin injection 5,000 Units  5,000 Units Subcutaneous 3 times per day Shanda Howells, MD      . morphine 2 MG/ML injection 2 mg  2 mg Intravenous Q2H PRN Shanda Howells, MD      . nitroGLYCERIN (NITROSTAT) SL tablet 0.4 mg  0.4 mg Sublingual Q5 min PRN Shanda Howells, MD      . ondansetron [facility]) injection 4 mg  4 mg Intravenous Q6H PRN Shanda Howells, MD       Current Outpatient Prescriptions  Medication Sig Dispense Refill  . HYDROcodone-acetaminophen (NORCO) 10-325 MG per tablet Take 1 tablet by mouth every 6 (six) hours as needed for moderate pain.    [name] azithromycin (ZITHROMAX) 500 MG tablet Take 1 tablet (500 mg total) by mouth at bedtime. (Patient not taking: Reported on 10/19/2014) 3 tablet 0  . carbamazepine (TEGRETOL) 200 MG tablet TAKE 2 TABLETS (400 MG TOTAL) BY MOUTH 2 (TWO) TIMES DAILY. (Patient not taking: Reported on 10/28/2014) 120 tablet 5  . cephALEXin (KEFLEX) 500 MG capsule Take 1 capsule (500 mg total) by mouth 3 (three) times daily. (Patient not taking: Reported on 10/28/2014) 30 capsule 0  . metroNIDAZOLE (FLAGYL) 500 MG tablet Take 1 tablet (500 mg total) by mouth 2 (two) times daily. (Patient not taking: Reported on 10/28/2014) 14 tablet 0   Review Of Systems: 12 point ROS negative except as noted above in HPI.  Physical Exam: Filed Vitals:   10/28/14 2100  BP: 134/64  Pulse: 58  Temp:   Resp: 18    General: alert and cooperative, morbidly obese HEENT: PERRLA and extra ocular movement intact Heart: S1, S2 normal, no murmur, rub or gallop, regular rate and rhythm Lungs: clear to auscultation, no wheezes or  rales and unlabored breathing Abdomen: obese abdomen, + bowel sounds  Extremities: extremities normal, atraumatic, no cyanosis or edema Skin:no rashes Neurology: normal without focal findings  Labs and Imaging: Lab Results  Component Value Date/Time   NA 135 10/28/2014 01:35 PM   NA 139 05/04/2013 04:02 PM   K 3.7 10/28/2014 01:35 PM   CL 102 10/28/2014 01:35 PM   CO2 27 10/28/2014 01:35 PM   BUN 11 10/28/2014 01:35 PM   BUN 16 05/04/2013 04:02 PM   CREATININE 0.88 10/28/2014 01:35 PM   GLUCOSE 113* 10/28/2014 01:35 PM   GLUCOSE 102* 05/04/2013 04:02 PM   Lab Results  Component Value Date   WBC 7.1 10/28/2014   HGB 13.8 10/28/2014   HCT 43.2 10/28/2014   MCV 91.3 10/28/2014   PLT 153 10/28/2014    Dg  Chest 2 View  10/28/2014   CLINICAL DATA:  69 year old female with chest pain and shortness of breath for 1 day.  EXAM: CHEST  2 VIEW  COMPARISON:  Chest x-ray 10/19/2014.  FINDINGS: Lung volumes are normal. No consolidative airspace disease. No pleural effusions. No evidence of pulmonary edema. Pulmonary venous congestion. Mild cardiomegaly. Upper mediastinal contours are within normal limits. Atherosclerosis in the thoracic aorta.  IMPRESSION: 1. Mild cardiomegaly with pulmonary venous congestion, but no frank pulmonary edema. 2. Atherosclerosis.   Electronically Signed   By: Vinnie Langton M.D.   On: 10/28/2014 14:55   US Abdomen Limited Ruq  10/28/2014   CLINICAL DATA:  Right upper quadrant pain  EXAM: US ABDOMEN LIMITED - RIGHT UPPER QUADRANT  COMPARISON:  None.  FINDINGS: Gallbladder:  Large 2 cm gallstone is noted without complicating factors.  Common bile duct:  Diameter: 4.9 mm.  Liver:  Increased echogenicity is noted consistent with fatty infiltration.  IMPRESSION: Fatty liver.  Cholelithiasis without complicating factors.   Electronically Signed   By: Inez Catalina M.D.   On: 10/28/2014 20:42           Shanda Howells MD  Pager: [phone]

## 2014-10-28 NOTE — ED Notes (Signed)
Report attempted 

## 2014-10-29 ENCOUNTER — Encounter (HOSPITAL_COMMUNITY): Payer: Self-pay | Admitting: General Practice

## 2014-10-29 ENCOUNTER — Observation Stay (HOSPITAL_COMMUNITY): Payer: Medicare Other

## 2014-10-29 DIAGNOSIS — R0789 Other chest pain: Secondary | ICD-10-CM

## 2014-10-29 DIAGNOSIS — R079 Chest pain, unspecified: Secondary | ICD-10-CM | POA: Diagnosis not present

## 2014-10-29 LAB — CBC WITH DIFFERENTIAL/PLATELET
BASOS ABS: 0 10*3/uL (ref 0.0–0.1)
Basophils Relative: 0 % (ref 0–1)
EOS PCT: 2 % (ref 0–5)
Eosinophils Absolute: 0.1 10*3/uL (ref 0.0–0.7)
HCT: 41.6 % (ref 36.0–46.0)
Hemoglobin: 13.3 g/dL (ref 12.0–15.0)
Lymphocytes Relative: 36 % (ref 12–46)
Lymphs Abs: 2 10*3/uL (ref 0.7–4.0)
MCH: 28.9 pg (ref 26.0–34.0)
MCHC: 32 g/dL (ref 30.0–36.0)
MCV: 90.2 fL (ref 78.0–100.0)
Monocytes Absolute: 0.6 10*3/uL (ref 0.1–1.0)
Monocytes Relative: 10 % (ref 3–12)
Neutro Abs: 2.8 10*3/uL (ref 1.7–7.7)
Neutrophils Relative %: 52 % (ref 43–77)
PLATELETS: 155 10*3/uL (ref 150–400)
RBC: 4.61 MIL/uL (ref 3.87–5.11)
RDW: 14.5 % (ref 11.5–15.5)
WBC: 5.5 10*3/uL (ref 4.0–10.5)

## 2014-10-29 LAB — COMPREHENSIVE METABOLIC PANEL
ALBUMIN: 3.5 g/dL (ref 3.5–5.2)
ALK PHOS: 52 U/L (ref 39–117)
ALT: 18 U/L (ref 0–35)
AST: 24 U/L (ref 0–37)
Anion gap: 9 (ref 5–15)
BILIRUBIN TOTAL: 0.6 mg/dL (ref 0.3–1.2)
BUN: 8 mg/dL (ref 6–23)
CO2: 22 mmol/L (ref 19–32)
Calcium: 8.8 mg/dL (ref 8.4–10.5)
Chloride: 107 mmol/L (ref 96–112)
Creatinine, Ser: 0.83 mg/dL (ref 0.50–1.10)
GFR calc Af Amer: 83 mL/min — ABNORMAL LOW (ref >90)
GFR calc non Af Amer: 72 mL/min — ABNORMAL LOW (ref >90)
Glucose, Bld: 106 mg/dL — ABNORMAL HIGH (ref 70–99)
POTASSIUM: 4.3 mmol/L (ref 3.5–5.1)
Sodium: 138 mmol/L (ref 135–145)
TOTAL PROTEIN: 6.7 g/dL (ref 6.0–8.3)

## 2014-10-29 LAB — TROPONIN I

## 2014-10-29 MED ORDER — PNEUMOCOCCAL VAC POLYVALENT 25 MCG/0.5ML IJ INJ
0.5000 mL | INJECTION | INTRAMUSCULAR | Status: AC
  Administered 2014-10-30: 0.5 mL via INTRAMUSCULAR
  Filled 2014-10-29: qty 0.5

## 2014-10-29 MED ORDER — REGADENOSON 0.4 MG/5ML IV SOLN
0.4000 mg | Freq: Once | INTRAVENOUS | Status: AC
  Administered 2014-10-29: 0.4 mg via INTRAVENOUS
  Filled 2014-10-29: qty 5

## 2014-10-29 MED ORDER — REGADENOSON 0.4 MG/5ML IV SOLN
INTRAVENOUS | Status: AC
  Administered 2014-10-29: 0.4 mg via INTRAVENOUS
  Filled 2014-10-29: qty 5

## 2014-10-29 NOTE — Consult Note (Signed)
Reason for Consult: chest pain Primary Cardiologist: new Referring Physician: Dr. [name] is an 69 y.o. female.  HPI: [name] is a 69 yo woman with PMH of morbidy obesity, GERD, COPD, prior tobacco use (quit 5 years ago) who was hospitalized in December for community acquired pneumonia (with some chest pain on presentation) who presents today with chest pain. She tells me she's had similar chest pain for years. She had similar chest pain in December and thought it was related to her PNA then. Today, the pain has been intermittent since 3am. She characterizes the pain as more of a burning/sharp pain that starts in her left shoulder/arm, moves across her upper chest/neck into her right shoulder/arm and right-chest. Breathing can aggravate her pain. Eating and movement do not necessarily make her symptoms worse. She has no other significant symptoms. Occasional shortness of breath and leg swelling. She has a strong family history of CAD - brother died of MI at age 13 and father at age 64. She had bad diarrhea related to antibiotics. 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 Cardiology consulted given chest pain. She's never had a heart catheterization but may have had a stress test years ago in [place].   Past Medical History  Diagnosis Date  . Heart murmur   . Headache(784.0)   . COPD (chronic obstructive pulmonary disease)   . GERD (gastroesophageal reflux disease)   . PVD (peripheral vascular disease)   . Hyperlipidemia   . Cervicalgia   . Migraine without aura, with intractable migraine, so stated, without mention of status migrainosus   . PVD (peripheral vascular disease)   . Osteoarthritis   . Chronic back pain   . Trigeminal neuralgia     Past Surgical History  Procedure Laterality Date  . Dental surgery      Family History  Problem Relation Age of Onset  . Heart attack Father   . Heart disease     . Diabetes    . Diabetes Mother   . Diabetes Brother     Social History:  reports that she quit smoking about 4 years ago. Her smoking use included Cigarettes. She has never used smokeless tobacco. She reports that she does not drink alcohol or use illicit drugs.  Allergies:  Allergies  Allergen Reactions  . Baclofen Other (See Comments)    Causes seizures    Medications:  I have reviewed the patient's current medications. Prior to Admission:  Prescriptions prior to admission  Medication Sig Dispense Refill Last Dose  . HYDROcodone-acetaminophen (NORCO) 10-325 MG per tablet Take 1 tablet by mouth every 6 (six) hours as needed for moderate pain.   10/25/2014  . azithromycin (ZITHROMAX) 500 MG tablet Take 1 tablet (500 mg total) by mouth at bedtime. (Patient not taking: Reported on 10/19/2014) 3 tablet 0 Completed Course at Unknown time  . carbamazepine (TEGRETOL) 200 MG tablet TAKE 2 TABLETS (400 MG TOTAL) BY MOUTH 2 (TWO) TIMES DAILY. (Patient not taking: Reported on 10/28/2014) 120 tablet 5 Not Taking at Unknown time  . cephALEXin (KEFLEX) 500 MG capsule Take 1 capsule (500 mg total) by mouth 3 (three) times daily. (Patient not taking: Reported on 10/28/2014) 30 capsule 0 Not Taking at Unknown time  . metroNIDAZOLE (FLAGYL) 500 MG tablet Take 1 tablet (500 mg total) by mouth 2 (two) times daily. (Patient not taking: Reported on 10/28/2014) 14 tablet 0 Not Taking at Unknown  time   Scheduled: . aspirin EC  325 mg Oral Daily  . heparin  5,000 Units Subcutaneous 3 times per day  . pantoprazole  40 mg Oral Daily    Results for orders placed or performed during the hospital encounter of 10/28/14 (from the past 48 hour(s))  CBC     Status: None   Collection Time: 10/28/14  1:35 PM  Result Value Ref Range   WBC 7.1 4.0 - 10.5 K/uL   RBC 4.73 3.87 - 5.11 MIL/uL   Hemoglobin 13.8 12.0 - 15.0 g/dL   HCT 43.2 36.0 - 46.0 %   MCV 91.3 78.0 - 100.0 fL   MCH 29.2 26.0 - 34.0 pg   MCHC 31.9 30.0  - 36.0 g/dL   RDW 14.5 11.5 - 15.5 %   Platelets 153 150 - 400 K/uL  Basic metabolic panel     Status: Abnormal   Collection Time: 10/28/14  1:35 PM  Result Value Ref Range   Sodium 135 135 - 145 mmol/L   Potassium 3.7 3.5 - 5.1 mmol/L   Chloride 102 96 - 112 mmol/L   CO2 27 19 - 32 mmol/L   Glucose, Bld 113 (H) 70 - 99 mg/dL   BUN 11 6 - 23 mg/dL   Creatinine, Ser 0.88 0.50 - 1.10 mg/dL   Calcium 8.9 8.4 - 10.5 mg/dL   GFR calc non Af Amer 67 (L) >90 mL/min   GFR calc Af Amer 78 (L) >90 mL/min    Comment: (NOTE) The eGFR has been calculated using the CKD EPI equation. This calculation has not been validated in all clinical situations. eGFR's persistently <90 mL/min signify possible Chronic Kidney Disease.    Anion gap 6 5 - 15  I-stat troponin, ED (not at [facility])     Status: None   Collection Time: 10/28/14  1:48 PM  Result Value Ref Range   Troponin i, poc 0.01 0.00 - 0.08 ng/mL   Comment 3            Comment: Due to the release kinetics of cTnI, a negative result within the first hours of the onset of symptoms does not rule out myocardial infarction with certainty. If myocardial infarction is still suspected, repeat the test at appropriate intervals.   Urinalysis, Routine w reflex microscopic     Status: Abnormal   Collection Time: 10/28/14  4:03 PM  Result Value Ref Range   Color, Urine YELLOW YELLOW   APPearance CLEAR CLEAR   Specific Gravity, Urine 1.022 1.005 - 1.030   pH 5.5 5.0 - 8.0   Glucose, UA NEGATIVE NEGATIVE mg/dL   Hgb urine dipstick NEGATIVE NEGATIVE   Bilirubin Urine NEGATIVE NEGATIVE   Ketones, ur NEGATIVE NEGATIVE mg/dL   Protein, ur NEGATIVE NEGATIVE mg/dL   Urobilinogen, UA 0.2 0.0 - 1.0 mg/dL   Nitrite NEGATIVE NEGATIVE   Leukocytes, UA SMALL (A) NEGATIVE  Urine microscopic-add on     Status: Abnormal   Collection Time: 10/28/14  4:03 PM  Result Value Ref Range   Squamous Epithelial / LPF FEW (A) RARE   WBC, UA 3-6 <3 WBC/hpf   RBC / HPF 0-2  <3 RBC/hpf   Bacteria, UA RARE RARE   Urine-Other MUCOUS PRESENT   Hepatic function panel     Status: None   Collection Time: 10/28/14  4:10 PM  Result Value Ref Range   Total Protein 7.3 6.0 - 8.3 g/dL   Albumin 4.0 3.5 - 5.2 g/dL  AST 18 0 - 37 U/L   ALT 17 0 - 35 U/L   Alkaline Phosphatase 58 39 - 117 U/L   Total Bilirubin 0.7 0.3 - 1.2 mg/dL   Bilirubin, Direct 0.1 0.0 - 0.5 mg/dL   Indirect Bilirubin 0.6 0.3 - 0.9 mg/dL  Lipase, blood     Status: None   Collection Time: 10/28/14  4:10 PM  Result Value Ref Range   Lipase 23 11 - 59 U/L  D-dimer, quantitative     Status: None   Collection Time: 10/28/14  4:10 PM  Result Value Ref Range   D-Dimer, Quant <0.27 0.00 - 0.48 ug/mL-FEU    Comment:        AT THE INHOUSE ESTABLISHED CUTOFF VALUE OF 0.48 ug/mL FEU, THIS ASSAY HAS BEEN DOCUMENTED IN THE LITERATURE TO HAVE A SENSITIVITY AND NEGATIVE PREDICTIVE VALUE OF AT LEAST 98 TO 99%.  THE TEST RESULT SHOULD BE CORRELATED WITH AN ASSESSMENT OF THE CLINICAL PROBABILITY OF DVT / VTE.   I-Stat Troponin, ED (not at [facility])     Status: None   Collection Time: 10/28/14  6:48 PM  Result Value Ref Range   Troponin i, poc 0.04 0.00 - 0.08 ng/mL   Comment 3            Comment: Due to the release kinetics of cTnI, a negative result within the first hours of the onset of symptoms does not rule out myocardial infarction with certainty. If myocardial infarction is still suspected, repeat the test at appropriate intervals.   Troponin I-serum (0, 3, 6 hours)     Status: None   Collection Time: 10/28/14  9:49 PM  Result Value Ref Range   Troponin I <0.03 <0.031 ng/mL    Comment:        NO INDICATION OF MYOCARDIAL INJURY.   Brain natriuretic peptide     Status: None   Collection Time: 10/28/14  9:58 PM  Result Value Ref Range   B Natriuretic Peptide 39.4 0.0 - 100.0 pg/mL  TSH     Status: None   Collection Time: 10/28/14  9:58 PM  Result Value Ref Range   TSH 4.212 0.350 - 4.500  uIU/mL  Lipid panel     Status: Abnormal   Collection Time: 10/28/14  9:58 PM  Result Value Ref Range   Cholesterol 182 0 - 200 mg/dL   Triglycerides 109 <150 mg/dL   HDL 52 >39 mg/dL   Total CHOL/HDL Ratio 3.5 RATIO   VLDL 22 0 - 40 mg/dL   LDL Cholesterol 108 (H) 0 - 99 mg/dL    Comment:        Total Cholesterol/HDL:CHD Risk Coronary Heart Disease Risk Table                     Men   Women  1/2 Average Risk   3.4   3.3  Average Risk       5.0   4.4  2 X Average Risk   9.6   7.1  3 X Average Risk  23.4   11.0        Use the calculated Patient Ratio above and the CHD Risk Table to determine the patient's CHD Risk.        ATP III CLASSIFICATION (LDL):  <100     mg/dL   Optimal  100-129  mg/dL   Near or Above  Optimal  130-159  mg/dL   Borderline  160-189  mg/dL   High  >190     mg/dL   Very High     Dg Chest 2 View  10/28/2014   CLINICAL DATA:  69 year old female with chest pain and shortness of breath for 1 day.  EXAM: CHEST  2 VIEW  COMPARISON:  Chest x-ray 10/19/2014.  FINDINGS: Lung volumes are normal. No consolidative airspace disease. No pleural effusions. No evidence of pulmonary edema. Pulmonary venous congestion. Mild cardiomegaly. Upper mediastinal contours are within normal limits. Atherosclerosis in the thoracic aorta.  IMPRESSION: 1. Mild cardiomegaly with pulmonary venous congestion, but no frank pulmonary edema. 2. Atherosclerosis.   Electronically Signed   By: Vinnie Langton M.D.   On: 10/28/2014 14:55   US Abdomen Limited Ruq  10/28/2014   CLINICAL DATA:  Right upper quadrant pain  EXAM: US ABDOMEN LIMITED - RIGHT UPPER QUADRANT  COMPARISON:  None.  FINDINGS: Gallbladder:  Large 2 cm gallstone is noted without complicating factors.  Common bile duct:  Diameter: 4.9 mm.  Liver:  Increased echogenicity is noted consistent with fatty infiltration.  IMPRESSION: Fatty liver.  Cholelithiasis without complicating factors.   Electronically Signed   By:  Inez Catalina M.D.   On: 10/28/2014 20:42    Review of Systems  Constitutional: Positive for malaise/fatigue. Negative for fever and chills.  HENT: Negative for ear pain.   Eyes: Negative for double vision and pain.  Respiratory: Negative for cough and hemoptysis.   Cardiovascular: Positive for chest pain and leg swelling. Negative for orthopnea and PND.  Gastrointestinal: Negative for abdominal pain and diarrhea.  Genitourinary: Negative for dysuria and hematuria.  Musculoskeletal: Positive for joint pain and neck pain.  Skin: Negative for rash.  Neurological: Positive for dizziness and tingling. Negative for tremors and headaches.  Endo/Heme/Allergies: Negative for polydipsia. Does not bruise/bleed easily.  Psychiatric/Behavioral: Negative for depression, suicidal ideas and substance abuse.   Blood pressure 143/68, pulse 63, temperature 98.3 F (36.8 C), temperature source Oral, resp. rate 18, height '5\' 5"'  (1.651 m), weight 150.05 kg (330 lb 12.8 oz), SpO2 94 %. Physical Exam  Nursing note and vitals reviewed. Constitutional: She is oriented to person, place, and time. No distress.  Pleasant, fully conversant, overweight woman  HENT:  Head: Normocephalic and atraumatic.  Nose: Nose normal.  Mouth/Throat: Oropharynx is clear and moist. No oropharyngeal exudate.  Eyes: Conjunctivae and EOM are normal. Pupils are equal, round, and reactive to light. No scleral icterus.  Neck: Normal range of motion. Neck supple. No JVD present. No tracheal deviation present.  Cardiovascular: Normal rate, regular rhythm and intact distal pulses.  Exam reveals no gallop.   No murmur heard. Respiratory: Effort normal. She has no wheezes. She has no rales.  Decreased breath sounds at the bases  GI: Soft. Bowel sounds are normal. She exhibits no distension. There is no tenderness. There is no rebound.  Musculoskeletal: Normal range of motion. She exhibits edema. She exhibits no tenderness.  Neurological:  She is alert and oriented to person, place, and time. No cranial nerve deficit. Coordination normal.  Skin: Skin is warm and dry. No rash noted. She is not diaphoretic. No erythema.  Psychiatric: She has a normal mood and affect. Her behavior is normal. Thought content normal.  labs reviewed above 12/11 Echo with hard to visualize RV - ? Enlarged/hypocontracticle, preserved LVEF ~60%, mild LVH EKG: sinus rhythm, 1st degree AV block, artifact Chest x-ray: ? Mild congestion with cardiomegaly  Assessment/Plan: Ms. Kyri Shader is a 69 yo woman with PMH of morbidy obesity, GERD, COPD, prior tobacco use (quit 5 years ago) who was hospitalized in December for community acquired pneumonia (with some chest pain on presentation) who presents today with chest pain. Differential diagnosis is musculoskeletal pain, esophageal spasm, GERD, mild heart failure, pericarditis, ACS/NSTEMI among other etiologies. I favor a diagnosis of atypical chest pain related to GERD or neuralgia. Risk factors of age > 15, family history, dyslipidemia, metabolic syndrome  - NPO after 4 AM for potential stress test - trend cardiac markers - observation on telemetry - asa 81 mg - hba1c, tsh, lipid panel, BNP - echocardiogram  - her weight makes stress testing more challenging; she may need a 2 day nuclear study vs. Consider Coronary CT    Undrea Shipes 10/29/2014, 12:12 AM

## 2014-10-29 NOTE — Progress Notes (Signed)
Patient Demographics  Janice Morrow, is a 69 y.o. female, DOB - 1945-12-16, ZOX:[id number]  Admit date - 10/28/2014   Admitting Physician Janice Howells, MD  Outpatient Primary MD for the patient is Janice Calico, MD  LOS -    Chief Complaint  Patient presents with  . Chest Pain      Admission history of present illness/brief narrative:  69 y.o. year old female with significant past medical history of morbid obesity, COPD, GERD, PVD presenting with chest pain. Presents with sudden onset of chest painat work. Patient states chest pain was central in nature with radiation to the left side of her shoulder. Minimal/mild diaphoresis.  Reports similar episodes associated with indigestion per patient. No alleviating or aggravating factors. Symptoms have persisted throughout the course of the day. Reports also having some diarrhea secondary to recent antibiotic use. Presented to the ER temperature 98.5, heart rate 50s to 70s, respirations tens to 20s, blood pressure in the 110s to 170s, satting 92% on room air. CBC and be met within normal limits. D dimer WNL. Chest x-ray shows mild pulmonary vascular congestion. Abdominal ultrasound shows cholelithiasis without acute obstruction or gallbladder dilatation. I-. EKG sinus rhythm with first-degree AV block. Denies any hx/o heart attack in the past. Does report prior hx/o pericarditis in the remote past.  Patient was seen by cardiology, and the plan is for nuclear study for risk stratification, which will need 2 days.    Subjective:   [name] today has, No headache, No chest pain, No abdominal pain - No Nausea, No new weakness tingling or numbness, No Cough - SOB.   Assessment & Plan    Active Problems:   Chest pain  Chest Pain - Continue on telemetry monitor, troponins negative 3. - D-dimer is within normal limit - 2-D  echo pending - Cartilage consult appreciated, plan is for nuclear stress test. - Continue with full dose aspirin  COPD -stable , no wheezing    GERD  -stable  -may be possible contributor to above  -PPI    Code Status: FULL  Family Communication: none at bedside.  Disposition Plan: home when stable.   Procedures Nuclear stress test   Consults   Cardiology  Medications  Scheduled Meds: . aspirin EC  325 mg Oral Daily  . heparin  5,000 Units Subcutaneous 3 times per day  . pantoprazole  40 mg Oral Daily  . [START ON 10/30/2014] pneumococcal 23 valent vaccine  0.5 mL Intramuscular Tomorrow-1000   Continuous Infusions:  PRN Meds:.acetaminophen, morphine injection, nitroGLYCERIN, ondansetron (ZOFRAN) IV  DVT Prophylaxis   Heparin -  Lab Results  Component Value Date   PLT 155 10/29/2014    Antibiotics    Anti-infectives    None          Objective:   Filed Vitals:   10/29/14 1232 10/29/14 1234 10/29/14 1236 10/29/14 1345  BP: 136/76 141/63 136/63 152/72  Pulse: 87 81 83 87  Temp:    98.2 F (36.8 C)  TempSrc:    Oral  Resp:    20  Height:      Weight:      SpO2:    93%    Wt Readings from Last 3 Encounters:  10/29/14 150.277  kg (331 lb 4.8 oz)  08/22/14 151.6 kg (334 lb 3.5 oz)  04/27/14 159.666 kg (352 lb)     Intake/Output Summary (Last 24 hours) at 10/29/14 1419 Last data filed at 10/29/14 1000  Gross per 24 hour  Intake    480 ml  Output   1200 ml  Net   -720 ml     Physical Exam  Awake Alert, Oriented X 3, No new F.N deficits, Normal affect [place].AT,PERRAL Supple Neck,No JVD, No cervical lymphadenopathy appriciated.  Symmetrical Chest wall movement, Good air movement bilaterally, CTAB RRR,No Gallops,Rubs or new Murmurs, No Parasternal Heave +ve B.Sounds, Abd Soft, No tenderness, No organomegaly appriciated, No rebound - guarding or rigidity. No Cyanosis, Clubbing or edema, No new Rash or bruise    Data Review   Micro  Results Recent Results (from the past 240 hour(s))  Urine culture     Status: None   Collection Time: 10/20/14 12:41 AM  Result Value Ref Range Status   Specimen Description URINE, RANDOM  Final   Special Requests ADDED 0203  Final   Colony Count   Final    >=100,000 COLONIES/ML Performed at [facility]    Culture   Final    ESCHERICHIA COLI Performed at [facility]    Report Status 10/23/2014 FINAL  Final   Organism ID, Bacteria ESCHERICHIA COLI  Final      Susceptibility   Escherichia coli - MIC*    AMPICILLIN >=32 RESISTANT Resistant     CEFAZOLIN 16 INTERMEDIATE Intermediate     CEFTRIAXONE <=1 SENSITIVE Sensitive     CIPROFLOXACIN >=4 RESISTANT Resistant     GENTAMICIN <=1 SENSITIVE Sensitive     LEVOFLOXACIN >=8 RESISTANT Resistant     NITROFURANTOIN 64 INTERMEDIATE Intermediate     TOBRAMYCIN <=1 SENSITIVE Sensitive     TRIMETH/SULFA >=320 RESISTANT Resistant     PIP/TAZO <=4 SENSITIVE Sensitive     * ESCHERICHIA COLI    Radiology Reports Dg Chest 2 View  10/28/2014   CLINICAL DATA:  69 year old female with chest pain and shortness of breath for 1 day.  EXAM: CHEST  2 VIEW  COMPARISON:  Chest x-ray 10/19/2014.  FINDINGS: Lung volumes are normal. No consolidative airspace disease. No pleural effusions. No evidence of pulmonary edema. Pulmonary venous congestion. Mild cardiomegaly. Upper mediastinal contours are within normal limits. Atherosclerosis in the thoracic aorta.  IMPRESSION: 1. Mild cardiomegaly with pulmonary venous congestion, but no frank pulmonary edema. 2. Atherosclerosis.   Electronically Signed   By: Vinnie Langton M.D.   On: 10/28/2014 14:55   US Abdomen Limited Ruq  10/28/2014   CLINICAL DATA:  Right upper quadrant pain  EXAM: US ABDOMEN LIMITED - RIGHT UPPER QUADRANT  COMPARISON:  None.  FINDINGS: Gallbladder:  Large 2 cm gallstone is noted without complicating factors.  Common bile duct:  Diameter: 4.9 mm.  Liver:  Increased  echogenicity is noted consistent with fatty infiltration.  IMPRESSION: Fatty liver.  Cholelithiasis without complicating factors.   Electronically Signed   By: Inez Catalina M.D.   On: 10/28/2014 20:42    CBC  Recent Labs Lab 10/28/14 1335 10/29/14 0425  WBC 7.1 5.5  HGB 13.8 13.3  HCT 43.2 41.6  PLT 153 155  MCV 91.3 90.2  MCH 29.2 28.9  MCHC 31.9 32.0  RDW 14.5 14.5  LYMPHSABS  --  2.0  MONOABS  --  0.6  EOSABS  --  0.1  BASOSABS  --  0.0  Chemistries   Recent Labs Lab 10/28/14 1335 10/28/14 1610 10/29/14 0425  NA 135  --  138  K 3.7  --  4.3  CL 102  --  107  CO2 27  --  22  GLUCOSE 113*  --  106*  BUN 11  --  8  CREATININE 0.88  --  0.83  CALCIUM 8.9  --  8.8  AST  --  18 24  ALT  --  17 18  ALKPHOS  --  58 52  BILITOT  --  0.7 0.6   ------------------------------------------------------------------------------------------------------------------ estimated creatinine clearance is 99.3 mL/min (by C-G formula based on Cr of 0.83). ------------------------------------------------------------------------------------------------------------------ No results for input(s): HGBA1C in the last 72 hours. ------------------------------------------------------------------------------------------------------------------  Recent Labs  10/28/14 2158  CHOL 182  HDL 52  LDLCALC 108*  TRIG 109  CHOLHDL 3.5   ------------------------------------------------------------------------------------------------------------------  Recent Labs  10/28/14 2158  TSH 4.212   ------------------------------------------------------------------------------------------------------------------ No results for input(s): VITAMINB12, FOLATE, FERRITIN, TIBC, IRON, RETICCTPCT in the last 72 hours.  Coagulation profile No results for input(s): INR, PROTIME in the last 168 hours.   Recent Labs  10/28/14 1610  DDIMER <0.27    Cardiac Enzymes  Recent Labs Lab 10/28/14 2149  10/29/14 0059 10/29/14 0425  TROPONINI <0.03 <0.03 <0.03   ------------------------------------------------------------------------------------------------------------------ Invalid input(s): POCBNP     Time Spent in minutes   30 minutes   ELGERGAWY, DAWOOD M.D on 10/29/2014 at 2:19 PM  Between 7am to 7pm - Pager - [phone]  After 7pm go to [email or web address] - password [email or web address]  And look for the night coverage person covering for me after hours  [facility] Office  [phone]   **Disclaimer: This note may have been dictated with voice recognition software. Similar sounding words can inadvertently be transcribed and this note may contain transcription errors which may not have been corrected upon publication of note.**

## 2014-10-29 NOTE — Progress Notes (Signed)
UR completed 

## 2014-10-29 NOTE — Progress Notes (Signed)
Spoke with pt about CPAP. States that he did like it back in Dec, and didn't want to wear one tonight.

## 2014-10-29 NOTE — Progress Notes (Signed)
Stress part of 2 day Lexiscan stress test completed without significant complication. Pending resting image tomorrow before final image read by [facility] radiology  Signed, Almyra Deforest PA Pager: [phone]

## 2014-10-29 NOTE — Progress Notes (Signed)
Patient seen this a.m.  Consult ordered by the cardiology fellow.atypical chest pain. Electrocardiogram shows no ST changes. Enzymes negative. D-dimer normal. Plan nuclear study for risk stratification. This will need to be 2 day. Janice Morrow

## 2014-10-29 NOTE — Progress Notes (Signed)
  Echocardiogram 2D Echocardiogram has been performed.  Janice Morrow 10/29/2014, 2:47 PM

## 2014-10-29 NOTE — Progress Notes (Signed)
PT Cancellation Note  Patient Details Name: Janice Morrow MRN: [id number] DOB: [birth year]   Cancelled Treatment:    Reason Eval/Treat Not Completed: Patient at procedure or test/unavailable (Echo in room )   Parkman F 10/29/2014, 3:16 PM  Powderly Kanton Kamel,PT Acute Rehabilitation [phone] (pager)

## 2014-10-30 ENCOUNTER — Observation Stay (HOSPITAL_COMMUNITY): Payer: Medicare Other

## 2014-10-30 DIAGNOSIS — R079 Chest pain, unspecified: Secondary | ICD-10-CM | POA: Insufficient documentation

## 2014-10-30 DIAGNOSIS — R011 Cardiac murmur, unspecified: Secondary | ICD-10-CM | POA: Diagnosis not present

## 2014-10-30 DIAGNOSIS — R0789 Other chest pain: Secondary | ICD-10-CM | POA: Diagnosis not present

## 2014-10-30 LAB — HEMOGLOBIN A1C
Hgb A1c MFr Bld: 6.1 % — ABNORMAL HIGH (ref 4.8–5.6)
Mean Plasma Glucose: 128 mg/dL

## 2014-10-30 MED ORDER — TECHNETIUM TC 99M SESTAMIBI - CARDIOLITE
30.0000 | Freq: Once | INTRAVENOUS | Status: AC | PRN
Start: 2014-10-30 — End: 2014-10-30
  Administered 2014-10-30: 09:00:00 30 via INTRAVENOUS

## 2014-10-30 MED ORDER — TECHNETIUM TC 99M SESTAMIBI GENERIC - CARDIOLITE
30.0000 | Freq: Once | INTRAVENOUS | Status: AC | PRN
  Administered 2014-10-29: 30 via INTRAVENOUS

## 2014-10-30 NOTE — Evaluation (Signed)
Physical Therapy Evaluation Patient Details Name: Janice Morrow MRN: [id number] DOB: [birth year] Today's Date: 10/30/2014   History of Present Illness  69 y.o. year old female with significant past medical history of morbid obesity, COPD, GERD, PVD presenting with chest pain.  Clinical Impression  Pt admitted with above diagnosis. Pt currently with functional limitations due to the deficits listed below (see PT Problem List). Assessed for BPPV with no significant signs/symptoms present during dix-hallpike test. Pt states her dizziness began approximately 6 months ago and has been associated with ringing in her right ear. Dizziness is worse with head movements and persists for 20-30 minutes at a time. Ambulates without need for physical assistance and denies any falls in the past 6 months. She is unable to fit a rolling walker in her apartment but does present with difficulty mobilizing. Would greatly benefit from HHPT. Pt will benefit from skilled PT to increase their independence and safety with mobility to allow discharge to the venue listed below.       Follow Up Recommendations Home health PT    Equipment Recommendations  None recommended by PT    Recommendations for Other Services OT consult     Precautions / Restrictions Precautions Precautions: Fall Restrictions Weight Bearing Restrictions: No      Mobility  Bed Mobility Overal bed mobility: Needs Assistance Bed Mobility: Supine to Sit     Supine to sit: Supervision     General bed mobility comments: supervision for safety. VC for technique. No assist needed.  Transfers Overall transfer level: Needs assistance Equipment used: Straight cane Transfers: Sit to/from Stand Sit to Stand: Min guard         General transfer comment: Close guard for safety. Cues for hand placement. Requires extra time. From lowest bed setting.  Ambulation/Gait Ambulation/Gait assistance: Min guard Ambulation Distance (Feet): 15  Feet Assistive device: Straight cane Gait Pattern/deviations: Step-through pattern;Antalgic;Trunk flexed;Wide base of support Gait velocity: decreased   General Gait Details: VC for upright posture, min guard for safety. No buckling noted. Limited due to transport arriving to take pt to test.  Stairs            Wheelchair Mobility    Modified Rankin (Stroke Patients Only)       Balance Overall balance assessment: Needs assistance Sitting-balance support: No upper extremity supported;Feet supported Sitting balance-Leahy Scale: Good     Standing balance support: Single extremity supported Standing balance-Leahy Scale: Poor                               Pertinent Vitals/Pain Pain Assessment: No/denies pain    Home Living Family/patient expects to be discharged to:: Private residence Living Arrangements: Other relatives (brother) Available Help at Discharge: Family;Available PRN/intermittently Type of Home: Apartment Home Access: Level entry     Home Layout: One level Home Equipment: Cane - single point Additional Comments: States she cannot fit a walker in her house.    Prior Function Level of Independence: Independent with assistive device(s)         Comments: cane for mobility     Hand Dominance   Dominant Hand: Right    Extremity/Trunk Assessment   Upper Extremity Assessment: Defer to OT evaluation           Lower Extremity Assessment: Generalized weakness         Communication   Communication: No difficulties  Cognition Arousal/Alertness: Awake/alert Behavior During Therapy: WFL for tasks  assessed/performed Overall Cognitive Status: Within Functional Limits for tasks assessed                      General Comments General comments (skin integrity, edema, etc.): Performed Dix-Hallpike with no positive signs or symptoms noted during evaluation of all canals of inner ear. SpO2 93-94% on room air during therapy session.  --- Patient states that her dizziness began about six months ago and was associated with ringing in her Rt ear. Symptoms have become worse in the past 2 weeks, and are exacerbated by head movement. Symptoms last approximately 20-30 minutes at a time.    Exercises        Assessment/Plan    PT Assessment Patient needs continued PT services  PT Diagnosis Difficulty walking;Abnormality of gait;Generalized weakness   PT Problem List Decreased strength;Decreased activity tolerance;Decreased balance;Decreased mobility;Cardiopulmonary status limiting activity;Obesity  PT Treatment Interventions DME instruction;Gait training;Functional mobility training;Therapeutic activities;Therapeutic exercise;Balance training;Neuromuscular re-education;Patient/family education   PT Goals (Current goals can be found in the Care Plan section) Acute Rehab PT Goals Patient Stated Goal: go home PT Goal Formulation: With patient Time For Goal Achievement: 11/13/14 Potential to Achieve Goals: Good    Frequency Min 3X/week   Barriers to discharge Decreased caregiver support Brother works PRN    Co-evaluation               End of Session Equipment Utilized During Treatment: Gait belt Activity Tolerance: Patient tolerated treatment well;Other (comment) (Limited evaluation due to arriaval of transport for test) Patient left: Other (comment) (In w/c for transport to test/procedure.) Nurse Communication: Mobility status    Functional Assessment Tool Used: clinical observation Functional Limitation: Mobility: Walking and moving around Mobility: Walking and Moving Around Current Status (Q2297): At least 1 percent but less than 20 percent impaired, limited or restricted Mobility: Walking and Moving Around Goal Status [id number]): At least 1 percent but less than 20 percent impaired, limited or restricted    Time: 0842-0902 PT Time Calculation (min) (ACUTE ONLY): 20 min   Charges:     PT  Treatments $Canalith Rep Proc: 8-22 mins   PT G Codes:   PT G-Codes **NOT FOR INPATIENT CLASS** Functional Assessment Tool Used: clinical observation Functional Limitation: Mobility: Walking and moving around Mobility: Walking and Moving Around Current Status (J9417): At least 1 percent but less than 20 percent impaired, limited or restricted Mobility: Walking and Moving Around Goal Status [phone]): At least 1 percent but less than 20 percent impaired, limited or restricted    Ellouise Newer 10/30/2014, 10:03 AM Elayne Snare, [place]

## 2014-10-30 NOTE — Discharge Summary (Signed)
Janice Morrow, 69 y.o., DOB 09/23/1945, MRN [id number]. Admission date: 10/28/2014 Discharge Date 10/30/2014 Primary MD [name], MD Admitting Physician Shanda Howells, MD   PCP please follow-up on: - Patient will need a mammogram for a follow-up on incidental finding of right breast nodule on CT chest.  Admission Diagnosis  RUQ pain [R10.11]  Discharge Diagnosis   Active Problems:   Chest pain   Pain in the chest      Past Medical History  Diagnosis Date  . Heart murmur   . Headache(784.0)   . COPD (chronic obstructive pulmonary disease)   . GERD (gastroesophageal reflux disease)   . PVD (peripheral vascular disease)   . Hyperlipidemia   . Cervicalgia   . Migraine without aura, with intractable migraine, so stated, without mention of status migrainosus   . PVD (peripheral vascular disease)   . Osteoarthritis   . Chronic back pain   . Trigeminal neuralgia   . Shortness of breath dyspnea     Past Surgical History  Procedure Laterality Date  . Dental surgery       Hospital Course See H&P, Labs, Consult and Test reports for all details in brief, patient was admitted for **  Active Problems:   Chest pain   Pain in the chest  Admission history of present illness/brief narrative: 69 y.o. year old female with significant past medical history of morbid obesity, COPD, GERD, PVD presenting with chest pain. Presents with sudden onset of chest painat work. Patient states chest pain was central in nature with radiation to the left side of her shoulder. Minimal/mild diaphoresis. Reports similar episodes associated with indigestion per patient. No alleviating or aggravating factors. Symptoms have persisted throughout the course of the day. Reports also having some diarrhea secondary to recent antibiotic use. Presented to the ER temperature 98.5, heart rate 50s to 70s, respirations tens to 20s, blood pressure in the 110s to 170s, satting 92% on room air. CBC and be met within normal  limits. D dimer WNL. Chest x-ray shows mild pulmonary vascular congestion. Abdominal ultrasound shows cholelithiasis without acute obstruction or gallbladder dilatation. I-. EKG sinus rhythm with first-degree AV block. Denies any hx/o heart attack in the past. Does report prior hx/o pericarditis in the remote past.  Patient was seen by cardiology, where she had nuclear study for risk stratification, study was low risk, with ejection fraction of 58%, with no evidence of reversible ischemia or infarct. As well she had questionable right upper quadrant abdominal pain, she had an ultrasound of right upper quadrant, was significant only for fatty liver, with cholelithiasis without complicating factors.  Chest Pain - No significant arrhythmias telemetry monitor, troponins negative 3. - D-dimer is within normal limit - 2-D echo showing EF 50-55%, with grade 1 diastolic dysfunction. - Nuclear stress test showing no definite reversible ischemia or infarction, EF 58% and low risk stress test finding  COPD -stable , no wheezing   Incidental finding of right breast nodule on CT chest done during ED visit on 10/19/14 - Patient will need an outpatient mammogram follow-up, patient was notified about these findings, instructed about the importance of the follow-up of the mammogram, as malignancy need to be ruled out, patient reported understanding, reports she had most recent mammogram in  2011, had multiple breast biopsies when She was living in [place] few years ago which were benign, reports she will follow-up with PCP and schedule mammogram.  Consults   None Significant Tests:  See full reports for all details  Dg Chest 2 View  10/28/2014   CLINICAL DATA:  69 year old female with chest pain and shortness of breath for 1 day.  EXAM: CHEST  2 VIEW  COMPARISON:  Chest x-ray 10/19/2014.  FINDINGS: Lung volumes are normal. No consolidative airspace disease. No pleural effusions. No evidence of pulmonary  edema. Pulmonary venous congestion. Mild cardiomegaly. Upper mediastinal contours are within normal limits. Atherosclerosis in the thoracic aorta.  IMPRESSION: 1. Mild cardiomegaly with pulmonary venous congestion, but no frank pulmonary edema. 2. Atherosclerosis.   Electronically Signed   By: Vinnie Langton M.D.   On: 10/28/2014 14:55   Dg Chest 2 View  10/19/2014   CLINICAL DATA:  Chest pain for 2 days. Shortness of breath. Hypoxia.  EXAM: CHEST  2 VIEW  COMPARISON:  08/22/2014  FINDINGS: Moderate cardiomegaly is stable. Chronic pulmonary vascular congestion appears Stable. No evidence of acute infiltrate or pleural effusion.  IMPRESSION: Stable cardiomegaly and pulmonary vascular congestion. No acute findings.   Electronically Signed   By: Earle Gell M.D.   On: 10/19/2014 18:44   Ct Angio Chest Pe W/cm &/or Wo Cm  10/19/2014   CLINICAL DATA:  Chest pressure beginning two nights ago, shortness of breath, cough, upper back pain. No history pulmonary embolism. History of heart murmur, chronic back pain, COPD.  EXAM: CT ANGIOGRAPHY CHEST WITH CONTRAST  TECHNIQUE: Multidetector CT imaging of the chest was performed using the standard protocol during bolus administration of intravenous contrast. Multiplanar CT image reconstructions and MIPs were obtained to evaluate the vascular anatomy.  CONTRAST:  145m OMNIPAQUE IOHEXOL 350 MG/ML SOLN  COMPARISON:  Chest radiograph October 19, 2014 at 1811 hours and CT of the chest August 22, 2010  FINDINGS: PULMONARY ARTERY: Adequate contrast opacification of the pulmonary artery's. Main pulmonary artery is enlarged, 4.1 cm in transaxial dimension. No pulmonary arterial filling defects to the level of the subsegmental branches.  MEDIASTINUM: Heart size is moderately enlarged, no right heart strain. No pericardial fluid collections. Thoracic aorta is normal course and caliber, mild calcific atherosclerosis. Pulmonary vascular congestion. No lymphadenopathy by CT size  criteria.  LUNGS: Tracheobronchial tree is patent, no pneumothorax. No pleural effusions, focal consolidations, pulmonary nodules or masses.  SOFT TISSUES AND OSSEOUS STRUCTURES: Included view of the abdomen demonstrates apparent nephrolithiasis and air in the included LEFT kidney. Large body habitus. Visualized soft tissues and included osseous structures appear non acute ; 2 cm nodule RIGHT breast.  Review of the MIP images confirms the above findings.  IMPRESSION: No acute pulmonary embolism.  Moderate cardiomegaly and pulmonary vascular congestion.  Included view of the abdomen demonstrates air and apparent nephrolithiasis in partially imaged LEFT kidney. Recommend correlation with urinary analysis.  2 cm RIGHT breast nodule for which follow-up mammogram is recommended on a nonemergent basis.  Acute findings discussed with and reconfirmed by Dr.HEATHER LAISURE on 10/19/2014 at 10:22 pm.   Electronically Signed   By: CElon Alas  On: 10/19/2014 22:23   Nm Myocar Multi W/spect W/wall Motion / Ef  10/30/2014   CLINICAL DATA:  Chest pain.  Heart murmur.  EXAM: MYOCARDIAL IMAGING WITH SPECT (REST AND PHARMACOLOGIC-STRESS - 2 DAY PROTOCOL)  GATED LEFT VENTRICULAR WALL MOTION STUDY  LEFT VENTRICULAR EJECTION FRACTION  TECHNIQUE: Standard myocardial SPECT imaging was performed after resting intravenous injection of 30 mCi [id number]. Subsequently, on a second day, intravenous infusion of Lexiscan was performed under the supervision of the Cardiology staff. At peak effect of the drug, 30 mCi [id number] was  injected intravenously and standard myocardial SPECT imaging was performed. Quantitative gated imaging was also performed to evaluate left ventricular wall motion, and estimate left ventricular ejection fraction.  COMPARISON:  None.  FINDINGS: Perfusion: A small area of decreased activity is seen in the distal anteroapical wall of the left ventricle on both stress and rest images, which is likely  related to breast attenuation artifact. No reversible myocardial perfusion defects are seen.  Wall Motion: No regional left ventricular wall motion abnormality identified. Moderate to severe left ventricular dilatation noted.  Left Ventricular Ejection Fraction: 58 %  End diastolic volume 017 ml  End systolic volume 73 ml  IMPRESSION: 1. Probable breast attenuation artifact. No definite reversible ischemia or infarction.  2. Normal left ventricular wall motion, with left ventricular dilatation noted.  3. Left ventricular ejection fraction 58%  4. Low-risk stress test findings*.  *2012 Appropriate Use Criteria for Coronary Revascularization Focused Update: J Am Coll Cardiol. 5102;58(5):277-824. http://content.airportbarriers.com.aspx?articleid=1201161   Electronically Signed   By: Earle Gell M.D.   On: 10/30/2014 12:22   US Abdomen Limited Ruq  10/28/2014   CLINICAL DATA:  Right upper quadrant pain  EXAM: US ABDOMEN LIMITED - RIGHT UPPER QUADRANT  COMPARISON:  None.  FINDINGS: Gallbladder:  Large 2 cm gallstone is noted without complicating factors.  Common bile duct:  Diameter: 4.9 mm.  Liver:  Increased echogenicity is noted consistent with fatty infiltration.  IMPRESSION: Fatty liver.  Cholelithiasis without complicating factors.   Electronically Signed   By: Inez Catalina M.D.   On: 10/28/2014 20:42     Today   Subjective:   Oline Belk today has no headache,no chest abdominal pain,no new weakness tingling or numbness, feels much better .  Objective:   Blood pressure 158/70, pulse 60, temperature 97.9 F (36.6 C), temperature source Oral, resp. rate 19, height '5\' 5"'  (1.651 m), weight 149.5 kg (329 lb 9.4 oz), SpO2 96 %.  Intake/Output Summary (Last 24 hours) at 10/30/14 1408 Last data filed at 10/30/14 1007  Gross per 24 hour  Intake    480 ml  Output    800 ml  Net   -320 ml    Exam Awake Alert, Oriented *3, No new F.N deficits, Normal affect [place].AT,PERRAL Supple Neck,No JVD, No  cervical lymphadenopathy appriciated.  Symmetrical Chest wall movement, Good air movement bilaterally, CTAB RRR,No Gallops,Rubs or new Murmurs, No Parasternal Heave +ve B.Sounds, Abd Soft, Non tender, No organomegaly appriciated, No rebound -guarding or rigidity. No Cyanosis, Clubbing or edema, No new Rash or bruise  Data Review   Cultures -   CBC w Diff: Lab Results  Component Value Date   WBC 5.5 10/29/2014   WBC 8.4 05/04/2013   HGB 13.3 10/29/2014   HCT 41.6 10/29/2014   PLT 155 10/29/2014   LYMPHOPCT 36 10/29/2014   MONOPCT 10 10/29/2014   EOSPCT 2 10/29/2014   BASOPCT 0 10/29/2014   CMP: Lab Results  Component Value Date   NA 138 10/29/2014   NA 139 05/04/2013   K 4.3 10/29/2014   CL 107 10/29/2014   CO2 22 10/29/2014   BUN 8 10/29/2014   BUN 16 05/04/2013   CREATININE 0.83 10/29/2014   PROT 6.7 10/29/2014   PROT 6.8 05/04/2013   ALBUMIN 3.5 10/29/2014   BILITOT 0.6 10/29/2014   ALKPHOS 52 10/29/2014   AST 24 10/29/2014   ALT 18 10/29/2014  .  Micro Results No results found for this or any previous visit (from the past 240  hour(s)).   Discharge Instructions      Follow-up Information    Follow up with [name], MD.   Specialty:  Internal Medicine   Contact information:   [address] [address] [phone]       Discharge Medications     Medication List    STOP taking these medications        azithromycin 500 MG tablet  Commonly known as:  ZITHROMAX     carbamazepine 200 MG tablet  Commonly known as:  TEGRETOL     cephALEXin 500 MG capsule  Commonly known as:  KEFLEX     metroNIDAZOLE 500 MG tablet  Commonly known as:  FLAGYL      TAKE these medications        HYDROcodone-acetaminophen 10-325 MG per tablet  Commonly known as:  NORCO  Take 1 tablet by mouth every 6 (six) hours as needed for moderate pain.         Total Time in preparing paper work, data evaluation and todays exam - 35  minutes  Cerita Rabelo M.D on 10/30/2014 at 2:08 PM  [place]  [phone]

## 2014-10-30 NOTE — Discharge Instructions (Signed)
Follow with Primary MD in 7 days  You will need to have a mammogram done as an outpatient to follow on an incidental finding of right breast nodule found on your recent CT chest, you will need mammogram to be done for further evaluation. ( Patient understands the importance  of the mammogram follow-up on breast nodules, and the potential for this nodule to be malignant)  Get CBC, CMP, 2 view Chest X ray checked  by Primary MD next visit. As well have mammogram arranged by him.    Activity: As tolerated with Full fall precautions use walker/cane & assistance as needed   Disposition Home    Diet: Heart Healthy  , with feeding assistance and aspiration precautions as needed.  For Heart failure patients - Check your Weight same time everyday, if you gain over 2 pounds, or you develop in leg swelling, experience more shortness of breath or chest pain, call your Primary MD immediately. Follow Cardiac Low Salt Diet and 1.5 lit/day fluid restriction.   On your next visit with your primary care physician please Get Medicines reviewed and adjusted.   Please request your Prim.MD to go over all Hospital Tests and Procedure/Radiological results at the follow up, please get all Hospital records sent to your Prim MD by signing hospital release before you go home.   If you experience worsening of your admission symptoms, develop shortness of breath, life threatening emergency, suicidal or homicidal thoughts you must seek medical attention immediately by calling 911 or calling your MD immediately  if symptoms less severe.  You Must read complete instructions/literature along with all the possible adverse reactions/side effects for all the Medicines you take and that have been prescribed to you. Take any new Medicines after you have completely understood and accpet all the possible adverse reactions/side effects.   Do not drive, operating heavy machinery, perform activities at heights, swimming or  participation in water activities or provide baby sitting services if your were admitted for syncope or siezures until you have seen by Primary MD or a Neurologist and advised to do so again.  Do not drive when taking Pain medications.    Do not take more than prescribed Pain, Sleep and Anxiety Medications  Special Instructions: If you have smoked or chewed Tobacco  in the last 2 yrs please stop smoking, stop any regular Alcohol  and or any Recreational drug use.  Wear Seat belts while driving.   Please note  You were cared for by a hospitalist during your hospital stay. If you have any questions about your discharge medications or the care you received while you were in the hospital after you are discharged, you can call the unit and asked to speak with the hospitalist on call if the hospitalist that took care of you is not available. Once you are discharged, your primary care physician will handle any further medical issues. Please note that NO REFILLS for any discharge medications will be authorized once you are discharged, as it is imperative that you return to your primary care physician (or establish a relationship with a primary care physician if you do not have one) for your aftercare needs so that they can reassess your need for medications and monitor your lab values.

## 2014-10-30 NOTE — Care Management Note (Signed)
    Page 1 of 1   10/30/2014     5:33:53 PM CARE MANAGEMENT NOTE 10/30/2014  Patient:  Janice Morrow, Janice Morrow   Account Number:  [id number]  Date Initiated:  10/30/2014  Documentation initiated by:  [facility]  Subjective/Objective Assessment:   adm: chest pain     Action/Plan:   discharge planning   Anticipated DC Date:  10/30/2014   Anticipated DC Plan:  [place]  CM consult  PCP issues      Choice offered to / List presented to:             Status of service:  Completed, signed off Medicare Important Message given?   (If response is "NO", the following Medicare IM given date fields will be blank) Date Medicare IM given:   Medicare IM given by:   Date Additional Medicare IM given:   Additional Medicare IM given by:    Discharge Disposition:  HOME/SELF CARE  Per UR Regulation:    If discussed at Long Length of Stay Meetings, dates discussed:    Comments:  10/30/14 17:00 Cm met with pt in room and gave pt [facility] number to secure a PCP.  CM also placed same information on AVS.  Pt verbalizes understanding she will call the [facility] number, secrure a PCP and schedule a follow up appointment for the expressed purpose of a gynecological referral and to get a Mammogram.  No other CM needs communicated.  Mariane Masters, BSN, Cm [phone].

## 2014-11-01 ENCOUNTER — Telehealth: Payer: Self-pay | Admitting: *Deleted

## 2014-11-01 NOTE — Telephone Encounter (Signed)
Called pt concerning TCM appt. Pt stated she no longer see Dr. Ronnald Ramp will be contacting her pcp...Janice Morrow

## 2014-11-07 ENCOUNTER — Other Ambulatory Visit: Payer: Self-pay | Admitting: Internal Medicine

## 2014-11-16 DIAGNOSIS — G894 Chronic pain syndrome: Secondary | ICD-10-CM | POA: Diagnosis not present

## 2014-11-16 DIAGNOSIS — M797 Fibromyalgia: Secondary | ICD-10-CM | POA: Diagnosis not present

## 2014-11-16 DIAGNOSIS — M545 Low back pain: Secondary | ICD-10-CM | POA: Diagnosis not present

## 2014-11-16 DIAGNOSIS — Z79899 Other long term (current) drug therapy: Secondary | ICD-10-CM | POA: Diagnosis not present

## 2014-11-20 ENCOUNTER — Inpatient Hospital Stay (HOSPITAL_COMMUNITY)
Admission: EM | Admit: 2014-11-20 | Discharge: 2014-11-23 | DRG: 689 | Disposition: A | Discharge status: Home Health | Payer: Medicare Other | Attending: Internal Medicine | Admitting: Internal Medicine

## 2014-11-20 ENCOUNTER — Emergency Department (HOSPITAL_COMMUNITY): Payer: Medicare Other

## 2014-11-20 ENCOUNTER — Encounter (HOSPITAL_COMMUNITY): Payer: Self-pay | Admitting: *Deleted

## 2014-11-20 DIAGNOSIS — Z6841 Body Mass Index (BMI) 40.0 and over, adult: Secondary | ICD-10-CM

## 2014-11-20 DIAGNOSIS — G9340 Encephalopathy, unspecified: Secondary | ICD-10-CM | POA: Diagnosis not present

## 2014-11-20 DIAGNOSIS — E785 Hyperlipidemia, unspecified: Secondary | ICD-10-CM | POA: Diagnosis present

## 2014-11-20 DIAGNOSIS — E669 Obesity, unspecified: Secondary | ICD-10-CM | POA: Diagnosis present

## 2014-11-20 DIAGNOSIS — R4182 Altered mental status, unspecified: Secondary | ICD-10-CM | POA: Diagnosis not present

## 2014-11-20 DIAGNOSIS — G8929 Other chronic pain: Secondary | ICD-10-CM | POA: Diagnosis present

## 2014-11-20 DIAGNOSIS — M1990 Unspecified osteoarthritis, unspecified site: Secondary | ICD-10-CM | POA: Diagnosis present

## 2014-11-20 DIAGNOSIS — Z8249 Family history of ischemic heart disease and other diseases of the circulatory system: Secondary | ICD-10-CM

## 2014-11-20 DIAGNOSIS — R0602 Shortness of breath: Secondary | ICD-10-CM | POA: Diagnosis not present

## 2014-11-20 DIAGNOSIS — M549 Dorsalgia, unspecified: Secondary | ICD-10-CM | POA: Diagnosis present

## 2014-11-20 DIAGNOSIS — N390 Urinary tract infection, site not specified: Secondary | ICD-10-CM | POA: Diagnosis not present

## 2014-11-20 DIAGNOSIS — I2510 Atherosclerotic heart disease of native coronary artery without angina pectoris: Secondary | ICD-10-CM | POA: Diagnosis present

## 2014-11-20 DIAGNOSIS — B9620 Unspecified Escherichia coli [E. coli] as the cause of diseases classified elsewhere: Secondary | ICD-10-CM | POA: Diagnosis present

## 2014-11-20 DIAGNOSIS — J449 Chronic obstructive pulmonary disease, unspecified: Secondary | ICD-10-CM | POA: Diagnosis present

## 2014-11-20 DIAGNOSIS — N3000 Acute cystitis without hematuria: Secondary | ICD-10-CM | POA: Diagnosis not present

## 2014-11-20 DIAGNOSIS — K219 Gastro-esophageal reflux disease without esophagitis: Secondary | ICD-10-CM | POA: Diagnosis present

## 2014-11-20 DIAGNOSIS — Z888 Allergy status to other drugs, medicaments and biological substances status: Secondary | ICD-10-CM

## 2014-11-20 DIAGNOSIS — R531 Weakness: Secondary | ICD-10-CM | POA: Diagnosis not present

## 2014-11-20 DIAGNOSIS — R42 Dizziness and giddiness: Secondary | ICD-10-CM | POA: Diagnosis present

## 2014-11-20 DIAGNOSIS — Z79891 Long term (current) use of opiate analgesic: Secondary | ICD-10-CM

## 2014-11-20 DIAGNOSIS — I739 Peripheral vascular disease, unspecified: Secondary | ICD-10-CM | POA: Diagnosis present

## 2014-11-20 DIAGNOSIS — Z87891 Personal history of nicotine dependence: Secondary | ICD-10-CM

## 2014-11-20 DIAGNOSIS — Z833 Family history of diabetes mellitus: Secondary | ICD-10-CM

## 2014-11-20 DIAGNOSIS — J441 Chronic obstructive pulmonary disease with (acute) exacerbation: Secondary | ICD-10-CM | POA: Diagnosis present

## 2014-11-20 DIAGNOSIS — R404 Transient alteration of awareness: Secondary | ICD-10-CM | POA: Diagnosis not present

## 2014-11-20 DIAGNOSIS — G43909 Migraine, unspecified, not intractable, without status migrainosus: Secondary | ICD-10-CM | POA: Diagnosis present

## 2014-11-20 DIAGNOSIS — B9689 Other specified bacterial agents as the cause of diseases classified elsewhere: Secondary | ICD-10-CM | POA: Diagnosis not present

## 2014-11-20 DIAGNOSIS — R5383 Other fatigue: Secondary | ICD-10-CM | POA: Diagnosis not present

## 2014-11-20 DIAGNOSIS — G500 Trigeminal neuralgia: Secondary | ICD-10-CM

## 2014-11-20 LAB — COMPREHENSIVE METABOLIC PANEL
ALBUMIN: 4.1 g/dL (ref 3.5–5.2)
ALK PHOS: 60 U/L (ref 39–117)
ALT: 20 U/L (ref 0–35)
AST: 19 U/L (ref 0–37)
Anion gap: 8 (ref 5–15)
BUN: 15 mg/dL (ref 6–23)
CALCIUM: 8.8 mg/dL (ref 8.4–10.5)
CHLORIDE: 100 mmol/L (ref 96–112)
CO2: 28 mmol/L (ref 19–32)
Creatinine, Ser: 0.82 mg/dL (ref 0.50–1.10)
GFR, EST AFRICAN AMERICAN: 85 mL/min — AB (ref >90)
GFR, EST NON AFRICAN AMERICAN: 73 mL/min — AB (ref >90)
Glucose, Bld: 141 mg/dL — ABNORMAL HIGH (ref 70–99)
Potassium: 4.2 mmol/L (ref 3.5–5.1)
SODIUM: 136 mmol/L (ref 135–145)
Total Bilirubin: 0.5 mg/dL (ref 0.3–1.2)
Total Protein: 7.5 g/dL (ref 6.0–8.3)

## 2014-11-20 LAB — URINALYSIS, ROUTINE W REFLEX MICROSCOPIC
Bilirubin Urine: NEGATIVE
Glucose, UA: NEGATIVE mg/dL
Hgb urine dipstick: NEGATIVE
Ketones, ur: NEGATIVE mg/dL
Nitrite: POSITIVE — AB
Protein, ur: NEGATIVE mg/dL
SPECIFIC GRAVITY, URINE: 1.015 (ref 1.005–1.030)
Urobilinogen, UA: 1 mg/dL (ref 0.0–1.0)
pH: 7.5 (ref 5.0–8.0)

## 2014-11-20 LAB — CBC
HCT: 42.2 % (ref 36.0–46.0)
Hemoglobin: 13.3 g/dL (ref 12.0–15.0)
MCH: 29 pg (ref 26.0–34.0)
MCHC: 31.5 g/dL (ref 30.0–36.0)
MCV: 91.9 fL (ref 78.0–100.0)
PLATELETS: 140 10*3/uL — AB (ref 150–400)
RBC: 4.59 MIL/uL (ref 3.87–5.11)
RDW: 14.3 % (ref 11.5–15.5)
WBC: 7.5 10*3/uL (ref 4.0–10.5)

## 2014-11-20 LAB — ACETAMINOPHEN LEVEL

## 2014-11-20 LAB — RAPID URINE DRUG SCREEN, HOSP PERFORMED
Amphetamines: NOT DETECTED
Barbiturates: NOT DETECTED
Benzodiazepines: NOT DETECTED
Cocaine: NOT DETECTED
Opiates: NOT DETECTED
Tetrahydrocannabinol: NOT DETECTED

## 2014-11-20 LAB — URINE MICROSCOPIC-ADD ON

## 2014-11-20 LAB — SALICYLATE LEVEL: Salicylate Lvl: <4 mg/dL (ref 2.8–20.0)

## 2014-11-20 MED ORDER — ACETAMINOPHEN 650 MG RE SUPP: 650.0000 mg | Freq: Four times a day (QID) | RECTAL | Status: DC | PRN

## 2014-11-20 MED ORDER — ONDANSETRON HCL 4 MG/2ML IJ SOLN
4.0000 mg | Freq: Once | INTRAMUSCULAR | Status: AC
  Administered 2014-11-20: 4 mg via INTRAVENOUS
  Filled 2014-11-20: qty 2

## 2014-11-20 MED ORDER — ENOXAPARIN SODIUM 40 MG/0.4ML ~~LOC~~ SOLN
40.0000 mg | SUBCUTANEOUS | Status: DC
  Administered 2014-11-20 – 2014-11-22 (×3): 40 mg via SUBCUTANEOUS
  Filled 2014-11-20 (×4): qty 0.4

## 2014-11-20 MED ORDER — DEXTROSE 5 % IV SOLN
1.0000 g | INTRAVENOUS | Status: DC
  Administered 2014-11-21 – 2014-11-22 (×2): 1 g via INTRAVENOUS
  Filled 2014-11-20 (×3): qty 10

## 2014-11-20 MED ORDER — CEFTRIAXONE SODIUM 1 G IJ SOLR
1.0000 g | Freq: Once | INTRAMUSCULAR | Status: AC
  Administered 2014-11-20: 1 g via INTRAVENOUS
  Filled 2014-11-20: qty 10

## 2014-11-20 MED ORDER — SODIUM CHLORIDE 0.9 % IV SOLN
INTRAVENOUS | Status: AC
  Administered 2014-11-21: 07:00:00 via INTRAVENOUS

## 2014-11-20 MED ORDER — ACETAMINOPHEN 325 MG PO TABS
650.0000 mg | ORAL_TABLET | Freq: Four times a day (QID) | ORAL | Status: DC | PRN
  Filled 2014-11-20: qty 2

## 2014-11-20 MED ORDER — KETOROLAC TROMETHAMINE 15 MG/ML IJ SOLN
15.0000 mg | Freq: Four times a day (QID) | INTRAMUSCULAR | Status: DC | PRN
  Administered 2014-11-20 – 2014-11-21 (×2): 15 mg via INTRAVENOUS
  Filled 2014-11-20 (×2): qty 1

## 2014-11-20 NOTE — H&P (Signed)
[facility] History and Physical  Janice Morrow UDJ:[id number] DOB: 1945/09/18 DOA: 11/20/2014  Referring physician: Dr. Canary Morrow PCP: Janice Calico, MD  Chief Complaint:  Altered mental status since one day  History primarily taken from ED physician.  HPI:  69 year old obese female with history of GERD, peripheral vascular disease, chronic back pain, hx of trigeminal neuralgia. She was recently hospitalized for atypical chest pain and underwent nuclear stress test which was negative for ischemia. Patient was brought by EMS after her brother called saying patient was not feeling right since one day. She appeared sleepy and confused. As per EMS she was able to answer questions but was sleepy and weak. They noted an unremarkable bottle with 6-7 pills left which as per brother was hydrocodone. Patient was having dry heaves and out to the hospital.  Patient is sleepy but arousable and able to answer questions. On questioning why she came to the hospital she says he probably has pneumonia and ongoing mid back pain. She reports having increased frequency of urination and occasional burning. Reports she has poor appetite for the past 2 days. Denies any fevers or chills. Reports occasional headaches but no dizziness, chest pain, palpitations, shortness of breath, abdominal pain, diarrhea. Reported nausea with dry heaving.  In the ED patient's vitals were stable. Blood work was unremarkable except for platelet of 140. Tylenol level, salicylate level, and urine drug screen was negative. Head CT was negative for acute abnormality. Chest x-ray is negative for acute infection. UA was positive for UTI. Patient was given 1 g IV Rocephin and hospice admission requested on observation.  Review of Systems:  As outlined in history of present illness. 12 point review of systems otherwise unremarkable.  Past Medical History  Diagnosis Date  . Heart murmur   . Headache(784.0)   . COPD (chronic obstructive  pulmonary disease)   . GERD (gastroesophageal reflux disease)   . PVD (peripheral vascular disease)   . Hyperlipidemia   . Cervicalgia   . Migraine without aura, with intractable migraine, so stated, without mention of status migrainosus   . PVD (peripheral vascular disease)   . Osteoarthritis   . Chronic back pain   . Trigeminal neuralgia   . Shortness of breath dyspnea    Past Surgical History  Procedure Laterality Date  . Dental surgery     Social History:  reports that she quit smoking about 4 years ago. Her smoking use included Cigarettes. She has never used smokeless tobacco. She reports that she does not drink alcohol or use illicit drugs.  Allergies  Allergen Reactions  . Baclofen Other (See Comments)    Causes seizures    Family History  Problem Relation Age of Onset  . Heart attack Father   . Heart disease    . Diabetes    . Diabetes Mother   . Diabetes Brother     Prior to Admission medications   Medication Sig Start Date End Date Taking? Authorizing Provider         HYDROcodone-acetaminophen (NORCO) 10-325 MG per tablet Take 1 tablet by mouth every 6 (six) hours as needed for moderate pain.   Yes Historical Provider, MD     Physical Exam:  Filed Vitals:   11/20/14 1206 11/20/14 1230 11/20/14 1503  BP: 152/63  144/80  Pulse: 79 75 75  Temp: 97.5 F (36.4 C)  97.2 F (36.2 C)  TempSrc: Oral  Oral  Resp: 23  17  SpO2: 92% 91% 97%    Constitutional:  Vital signs reviewed. Elderly obese female lying in bed sleepy but arousable, appeared dishelved.foul smell of urine noted.  HEENT: no pallor, no icterus, poor dentition, moist oral mucosa, no cervical lymphadenopathy Cardiovascular: RRR, S1 normal, S2 normal, no MRG Chest: CTAB, no wheezes, rales, or rhonchi,  Abdominal: Soft. Non-tender, non-distended, bowel sounds are normal,  GU: no CVA tenderness Ext: warm, no edema, mid back tenderness to pressure  Neurological: AP but arousable and oriented,  nonfocal  Labs on Admission:  Basic Metabolic Panel:  Recent Labs Lab 11/20/14 1411  NA 136  K 4.2  CL 100  CO2 28  GLUCOSE 141*  BUN 15  CREATININE 0.82  CALCIUM 8.8   Liver Function Tests:  Recent Labs Lab 11/20/14 1411  AST 19  ALT 20  ALKPHOS 60  BILITOT 0.5  PROT 7.5  ALBUMIN 4.1   No results for input(s): LIPASE, AMYLASE in the last 168 hours. No results for input(s): AMMONIA in the last 168 hours. CBC:  Recent Labs Lab 11/20/14 1411  WBC 7.5  HGB 13.3  HCT 42.2  MCV 91.9  PLT 140*   Cardiac Enzymes: No results for input(s): CKTOTAL, CKMB, CKMBINDEX, TROPONINI in the last 168 hours. BNP: Invalid input(s): POCBNP CBG: No results for input(s): GLUCAP in the last 168 hours.  Radiological Exams on Admission: Dg Chest 2 View  11/20/2014   CLINICAL DATA:  Weakness and shortness of breath  EXAM: CHEST  2 VIEW  COMPARISON:  10/28/2014, chest CT 10/19/2014  FINDINGS: Pulmonary arterial prominence with tapering is reidentified, which may be seen with pulmonary arterial hypertension. Mild cardiomegaly is reidentified. No new focal pulmonary opacity. No pleural effusion. No acute osseous finding.  IMPRESSION: Stable degree of central pulmonary arterial ectasia with rapid tapering which may be seen with pulmonary arterial hypertension. Venous congestion could appear similar.   Electronically Signed   By: Janice Morrow M.D.   On: 11/20/2014 13:55   Ct Head Wo Contrast  11/20/2014   CLINICAL DATA:  Lethargy and altered mental status for 1 day.  EXAM: CT HEAD WITHOUT CONTRAST  TECHNIQUE: Contiguous axial images were obtained from the base of the skull through the vertex without intravenous contrast.  COMPARISON:  August 22, 2014  FINDINGS: There is age related volume loss. There is no intracranial mass hemorrhage, extra-axial fluid collection, or midline shift. The gray-white compartments appear normal. There is no acute appearing infarct. The bony calvarium appears  intact. The mastoid air cells are clear.  IMPRESSION: Age related volume loss. No intracranial mass, hemorrhage, or focal gray -white compartment lesions/acute appearing infarct.   Electronically Signed   By: Janice Morrow III M.D.   On: 11/20/2014 14:30      Assessment/Plan  Principal problem Acute encephalopathy secondary to UTI Admit under observation. Follow urine culture. Hydration with IV normal saline. Continue empiric IV Rocephin. Mental status improving in the ED. Will avoid narcotics.  Active Problems: Chronic back pain Will avoid Vicodin and place her on when necessary IV Toradol 100 mental status improves.     Obesity          Diet:cardiac  DVT prophylaxis: sq lovenox   Code Status:full code Family Communication:called brother and left a message. Disposition Plan: Admit under observation overnight. Discharge home tomorrow if clinically improved.  Louellen Molder [facility] Pager [phone]  Total time spent on admission :45 minutes  If 7PM-7AM, please contact night-coverage [email or web address] Password [facility] 11/20/2014, 4:13 PM

## 2014-11-20 NOTE — ED Notes (Signed)
Lab attempt x2 unsuccessfully

## 2014-11-20 NOTE — ED Notes (Signed)
Per EMS report: pt coming from home: brother called EMS because the pt was "just not right."  EMS noted pt to be able to answer questions but was weak.  EMS observed an unmarked pill bottle with 6-7 pills left that the brother states is hydrocodone. Pt also dry heaved enroute to dept. Pt is able to turn herself in the bed.

## 2014-11-20 NOTE — ED Notes (Signed)
Bed: WA20 Expected date: 11/20/14 Expected time: 11:41 AM Means of arrival: Ambulance Comments: Near Syncope

## 2014-11-20 NOTE — ED Notes (Signed)
Patient transported to X-ray 

## 2014-11-20 NOTE — ED Notes (Addendum)
Pt states she hasn't eaten in a few days.  Pt is lethargic and will respond to questions appropriately but will quickly go back to sleep.

## 2014-11-20 NOTE — ED Notes (Signed)
Pt reports nausea 

## 2014-11-21 DIAGNOSIS — R42 Dizziness and giddiness: Secondary | ICD-10-CM

## 2014-11-21 DIAGNOSIS — G43909 Migraine, unspecified, not intractable, without status migrainosus: Secondary | ICD-10-CM | POA: Diagnosis present

## 2014-11-21 DIAGNOSIS — M1990 Unspecified osteoarthritis, unspecified site: Secondary | ICD-10-CM | POA: Diagnosis present

## 2014-11-21 DIAGNOSIS — I739 Peripheral vascular disease, unspecified: Secondary | ICD-10-CM | POA: Diagnosis present

## 2014-11-21 DIAGNOSIS — Z888 Allergy status to other drugs, medicaments and biological substances status: Secondary | ICD-10-CM | POA: Diagnosis not present

## 2014-11-21 DIAGNOSIS — N390 Urinary tract infection, site not specified: Principal | ICD-10-CM

## 2014-11-21 DIAGNOSIS — B9620 Unspecified Escherichia coli [E. coli] as the cause of diseases classified elsewhere: Secondary | ICD-10-CM | POA: Diagnosis present

## 2014-11-21 DIAGNOSIS — I2510 Atherosclerotic heart disease of native coronary artery without angina pectoris: Secondary | ICD-10-CM | POA: Diagnosis present

## 2014-11-21 DIAGNOSIS — G501 Atypical facial pain: Secondary | ICD-10-CM | POA: Diagnosis not present

## 2014-11-21 DIAGNOSIS — E785 Hyperlipidemia, unspecified: Secondary | ICD-10-CM | POA: Diagnosis present

## 2014-11-21 DIAGNOSIS — R4182 Altered mental status, unspecified: Secondary | ICD-10-CM | POA: Diagnosis not present

## 2014-11-21 DIAGNOSIS — Z79891 Long term (current) use of opiate analgesic: Secondary | ICD-10-CM | POA: Diagnosis not present

## 2014-11-21 DIAGNOSIS — R110 Nausea: Secondary | ICD-10-CM | POA: Diagnosis not present

## 2014-11-21 DIAGNOSIS — K219 Gastro-esophageal reflux disease without esophagitis: Secondary | ICD-10-CM | POA: Diagnosis present

## 2014-11-21 DIAGNOSIS — Z87891 Personal history of nicotine dependence: Secondary | ICD-10-CM | POA: Diagnosis not present

## 2014-11-21 DIAGNOSIS — Z8249 Family history of ischemic heart disease and other diseases of the circulatory system: Secondary | ICD-10-CM | POA: Diagnosis not present

## 2014-11-21 DIAGNOSIS — G500 Trigeminal neuralgia: Secondary | ICD-10-CM | POA: Diagnosis not present

## 2014-11-21 DIAGNOSIS — G8929 Other chronic pain: Secondary | ICD-10-CM | POA: Diagnosis present

## 2014-11-21 DIAGNOSIS — G9340 Encephalopathy, unspecified: Secondary | ICD-10-CM | POA: Diagnosis not present

## 2014-11-21 DIAGNOSIS — E669 Obesity, unspecified: Secondary | ICD-10-CM | POA: Diagnosis not present

## 2014-11-21 DIAGNOSIS — Z6841 Body Mass Index (BMI) 40.0 and over, adult: Secondary | ICD-10-CM | POA: Diagnosis not present

## 2014-11-21 DIAGNOSIS — Z833 Family history of diabetes mellitus: Secondary | ICD-10-CM | POA: Diagnosis not present

## 2014-11-21 DIAGNOSIS — J449 Chronic obstructive pulmonary disease, unspecified: Secondary | ICD-10-CM | POA: Diagnosis present

## 2014-11-21 DIAGNOSIS — M549 Dorsalgia, unspecified: Secondary | ICD-10-CM | POA: Diagnosis present

## 2014-11-21 LAB — CARBAMAZEPINE LEVEL, TOTAL: CARBAMAZEPINE LVL: 11.8 ug/mL (ref 4.0–12.0)

## 2014-11-21 MED ORDER — KETOROLAC TROMETHAMINE 30 MG/ML IJ SOLN
30.0000 mg | Freq: Once | INTRAMUSCULAR | Status: AC
  Administered 2014-11-21: 30 mg via INTRAVENOUS
  Filled 2014-11-21: qty 1

## 2014-11-21 MED ORDER — CARBAMAZEPINE 200 MG PO TABS
400.0000 mg | ORAL_TABLET | Freq: Two times a day (BID) | ORAL | Status: DC
  Administered 2014-11-21: 400 mg via ORAL
  Filled 2014-11-21 (×2): qty 2

## 2014-11-21 MED ORDER — ONDANSETRON HCL 4 MG/2ML IJ SOLN
4.0000 mg | Freq: Four times a day (QID) | INTRAMUSCULAR | Status: DC | PRN
  Administered 2014-11-21 (×2): 4 mg via INTRAVENOUS
  Filled 2014-11-21 (×2): qty 2

## 2014-11-21 MED ORDER — KETOROLAC TROMETHAMINE 15 MG/ML IJ SOLN: 15.0000 mg | Freq: Four times a day (QID) | INTRAMUSCULAR | Status: DC | PRN

## 2014-11-21 MED ORDER — KETOROLAC TROMETHAMINE 15 MG/ML IJ SOLN
15.0000 mg | Freq: Once | INTRAMUSCULAR | Status: AC
  Administered 2014-11-21: 15 mg via INTRAVENOUS
  Filled 2014-11-21: qty 1

## 2014-11-21 NOTE — Progress Notes (Signed)
Patient states headach better after Toradol, 5/10 now and was sleeping. Will continue to monitor.

## 2014-11-21 NOTE — Progress Notes (Signed)
UR completed 

## 2014-11-21 NOTE — Progress Notes (Signed)
Patient complains of nausea. RN aware. She also complains of the CPAP mask "don't fit right". Nocturnal PPV held for tonight due the nausea. Also, the patient is aware that RT is available if she should feel better and willing to try the mask again. Otherwise, she has been informed that a visitor may bring her home apparatus to her for nocturnal use. RT will continue to follow.

## 2014-11-21 NOTE — Progress Notes (Addendum)
[facility] Progress Note   Janice Morrow GBT:[id number] DOB: 1946-04-16 DOA: 11/20/2014 PCP: No primary care provider on file.  Brief narrative: Janice Morrow is a 69 y.o. female with history of GERD, peripheral vascular disease, chronic back pain, hx of trigeminal neuralgia admitted for sleepiness and confusion. Noted to have UTI   Subjective: Very alert and oriented- has burning micturition for days. Also, very dizzy on standing. No other complaints.   Assessment/Plan: Principal Problem:   Acute encephalopathy - appears to be resolved- may have been from UTI  Active Problems:   UTI (urinary tract infection) - with dysuria - cont Rocephin- f/u culture  Dizziness-  - orthostatic vitals negative- check Tegretol level- it was given to treat/ prevent migraines - will d/c it as it is not helping and may be causing dizziness  Chronic headaches? - it appears she has trigeminal neuralgia diagnosis for which the Tegretol was probably started but as mentioned above, holding it for now - states numerous medications have been tried by neurology most recent being Tegretol which is not helping - Toradol actually helps more    Obesity Body mass index is 55.28 kg/(m^2).   Right breast nodule noted on CT - need outpt mammogram as discussed with her on last admission   Code Status: full code Family Communication:  Disposition Plan: home when stable DVT prophylaxis: Lovenox Consultants: Procedures:  Antibiotics: Anti-infectives    Start     Dose/Rate Route Frequency Ordered Stop   11/21/14 1000  cefTRIAXone (ROCEPHIN) 1 g in dextrose 5 % 50 mL IVPB     1 g 100 mL/hr over 30 Minutes Intravenous Every 24 hours 11/20/14 1610     11/20/14 1430  cefTRIAXone (ROCEPHIN) 1 g in dextrose 5 % 50 mL IVPB     1 g 100 mL/hr over 30 Minutes Intravenous  Once 11/20/14 1429 11/20/14 1559      Objective: Filed Weights   11/20/14 1750  Weight: 150.685 kg (332 lb 3.2 oz)     Intake/Output Summary (Last 24 hours) at 11/21/14 1735 Last data filed at 11/21/14 1642  Gross per 24 hour  Intake   2145 ml  Output      0 ml  Net   2145 ml     Vitals Filed Vitals:   11/20/14 1750 11/20/14 2118 11/21/14 0503 11/21/14 1315  BP:  128/79 145/64 133/79  Pulse:  81 77 76  Temp:  97.7 F (36.5 C) 98.1 F (36.7 C) 98.3 F (36.8 C)  TempSrc:  Oral Oral Oral  Resp:  18 16 20   Height: 5\' 5"  (1.651 m)     Weight: 150.685 kg (332 lb 3.2 oz)     SpO2:  100% 96% 96%    Exam:  General:  Pt is alert, not in acute distress  HEENT: No icterus, No thrush  Cardiovascular: regular rate and rhythm, S1/S2 No murmur  Respiratory: clear to auscultation bilaterally   Abdomen: Soft, +Bowel sounds, non tender, non distended, no guarding  MSK: No LE edema, cyanosis or clubbing  Data Reviewed: Basic Metabolic Panel:  Recent Labs Lab 11/20/14 1411  NA 136  K 4.2  CL 100  CO2 28  GLUCOSE 141*  BUN 15  CREATININE 0.82  CALCIUM 8.8   Liver Function Tests:  Recent Labs Lab 11/20/14 1411  AST 19  ALT 20  ALKPHOS 60  BILITOT 0.5  PROT 7.5  ALBUMIN 4.1   No results for input(s): LIPASE, AMYLASE in the last  168 hours. No results for input(s): AMMONIA in the last 168 hours. CBC:  Recent Labs Lab 11/20/14 1411  WBC 7.5  HGB 13.3  HCT 42.2  MCV 91.9  PLT 140*   Cardiac Enzymes: No results for input(s): CKTOTAL, CKMB, CKMBINDEX, TROPONINI in the last 168 hours. BNP (last 3 results)  Recent Labs  10/19/14 2043 10/28/14 2158  BNP 51.8 39.4    ProBNP (last 3 results) No results for input(s): PROBNP in the last 8760 hours.  CBG: No results for input(s): GLUCAP in the last 168 hours.  No results found for this or any previous visit (from the past 240 hour(s)).   Studies:  Recent x-ray studies have been reviewed in detail by the Attending Physician  Scheduled Meds:  Scheduled Meds: . carbamazepine  400 mg Oral BID  . cefTRIAXone  (ROCEPHIN)  IV  1 g Intravenous Q24H  . enoxaparin (LOVENOX) injection  40 mg Subcutaneous Q24H   Continuous Infusions:   Time spent on care of this patient: 98 min   [place], MD 11/21/2014, 5:35 PM    [facility] Office  [phone] Pager - Text Page per [email or web address]  If 7PM-7AM, please contact night-coverage [email or web address]

## 2014-11-21 NOTE — ED Provider Notes (Signed)
CSN: [id number]     Arrival date & time 11/20/14  1155 History   First MD Initiated Contact with Patient 11/20/14 1219     Chief Complaint  Patient presents with  . Fatigue     (Consider location/radiation/quality/duration/timing/severity/associated sxs/prior Treatment) HPI   A LEVEL 5 CAVEAT PERTAINS DUE TO ALTERED MENTAL STATUS Pt presents via EMS- per report her brother stated she had not been acting right- she is answering some questions but is weak.  Pt had an episode of emesis  En route via EMS.  Pt denies taking any hydrocodone since yesterday.  Has had decreased po intake.    Past Medical History  Diagnosis Date  . Heart murmur   . Headache(784.0)   . COPD (chronic obstructive pulmonary disease)   . GERD (gastroesophageal reflux disease)   . PVD (peripheral vascular disease)   . Hyperlipidemia   . Cervicalgia   . Migraine without aura, with intractable migraine, so stated, without mention of status migrainosus   . PVD (peripheral vascular disease)   . Osteoarthritis   . Chronic back pain   . Trigeminal neuralgia   . Shortness of breath dyspnea    Past Surgical History  Procedure Laterality Date  . Dental surgery     Family History  Problem Relation Age of Onset  . Heart attack Father   . Heart disease    . Diabetes    . Diabetes Mother   . Diabetes Brother    History  Substance Use Topics  . Smoking status: Former Smoker    Types: Cigarettes    Quit date: 08/22/2010  . Smokeless tobacco: Never Used  . Alcohol Use: No   OB History    No data available     Review of Systems  UNABLE TO OBTAIN ROS DUE TO LEVEL 5 CAVEAT    Allergies  Baclofen  Home Medications   Prior to Admission medications   Medication Sig Start Date End Date Taking? Authorizing Provider  carbamazepine (TEGRETOL) 200 MG tablet Take 400 mg by mouth 2 (two) times daily.   Yes Historical Provider, MD  HYDROcodone-acetaminophen (NORCO) 10-325 MG per tablet Take 1 tablet by mouth  every 6 (six) hours as needed for moderate pain.   Yes Historical Provider, MD   BP 145/64 mmHg  Pulse 77  Temp(Src) 98.1 F (36.7 C) (Oral)  Resp 16  Ht 5\' 5"  (1.651 m)  Wt 332 lb 3.2 oz (150.685 kg)  BMI 55.28 kg/m2  SpO2 96%  Vitals reviewed Physical Exam  Physical Examination: General appearance - alert, chronically ill appearing, and in no distress Mental status - alert, oriented to person, place not to time Eyes - pupils equal and reactive, extraocular eye movements intact Mouth - mucous membranes moist, pharynx normal without lesions Chest - clear to auscultation, no wheezes, rales or rhonchi, symmetric air entry Heart - normal rate, regular rhythm, normal S1, S2, no murmurs, rubs, clicks or gallops Abdomen - soft, nontender, nondistended, no masses or organomegaly Neurological - alert but tired appearing, moving all extremities, answering some questions, then falling asleep Extremities - peripheral pulses normal, no pedal edema, no clubbing or cyanosis Skin - normal coloration and turgor, no rashes,   ED Course  Procedures (including critical care time) Labs Review Labs Reviewed  CBC - Abnormal; Notable for the following:    Platelets 140 (*)    All other components within normal limits  COMPREHENSIVE METABOLIC PANEL - Abnormal; Notable for the following:    Glucose,  Bld 141 (*)    GFR calc non Af Amer 73 (*)    GFR calc Af Amer 85 (*)    All other components within normal limits  ACETAMINOPHEN LEVEL - Abnormal; Notable for the following:    Acetaminophen (Tylenol), Serum <10.0 (*)    All other components within normal limits  URINALYSIS, ROUTINE W REFLEX MICROSCOPIC - Abnormal; Notable for the following:    APPearance CLOUDY (*)    Nitrite POSITIVE (*)    Leukocytes, UA SMALL (*)    All other components within normal limits  URINE MICROSCOPIC-ADD ON - Abnormal; Notable for the following:    Bacteria, UA MANY (*)    All other components within normal limits   URINE CULTURE  SALICYLATE LEVEL  URINE RAPID DRUG SCREEN (HOSP PERFORMED)    Imaging Review Dg Chest 2 View  11/20/2014   CLINICAL DATA:  Weakness and shortness of breath  EXAM: CHEST  2 VIEW  COMPARISON:  10/28/2014, chest CT 10/19/2014  FINDINGS: Pulmonary arterial prominence with tapering is reidentified, which may be seen with pulmonary arterial hypertension. Mild cardiomegaly is reidentified. No new focal pulmonary opacity. No pleural effusion. No acute osseous finding.  IMPRESSION: Stable degree of central pulmonary arterial ectasia with rapid tapering which may be seen with pulmonary arterial hypertension. Venous congestion could appear similar.   Electronically Signed   By: Conchita Paris M.D.   On: 11/20/2014 13:55   Ct Head Wo Contrast  11/20/2014   CLINICAL DATA:  Lethargy and altered mental status for 1 day.  EXAM: CT HEAD WITHOUT CONTRAST  TECHNIQUE: Contiguous axial images were obtained from the base of the skull through the vertex without intravenous contrast.  COMPARISON:  August 22, 2014  FINDINGS: There is age related volume loss. There is no intracranial mass hemorrhage, extra-axial fluid collection, or midline shift. The gray-white compartments appear normal. There is no acute appearing infarct. The bony calvarium appears intact. The mastoid air cells are clear.  IMPRESSION: Age related volume loss. No intracranial mass, hemorrhage, or focal gray -white compartment lesions/acute appearing infarct.   Electronically Signed   By: Lowella Grip III M.D.   On: 11/20/2014 14:30     EKG Interpretation   Date/Time:  Saturday November 20 2014 12:12:02 EDT Ventricular Rate:  78 PR Interval:  249 QRS Duration: 95 QT Interval:  392 QTC Calculation: 446 R Axis:   -15 Text Interpretation:  Sinus rhythm Prolonged PR interval Borderline left  axis deviation No significant change since last tracing Confirmed by  Canary Brim  MD, MARTHA [phone]) on 11/20/2014 12:28:54 PM      MDM    Final diagnoses:  Altered mental status, unspecified altered mental status type  UTI (lower urinary tract infection)    Pt presenting with altered mental status, fatigue.  Workup reveals UTI.  Head CT and CXR reassuring.  Pt started on IV rocephin.  [facility] further evaluation and management.      Alfonzo Beers, MD 11/21/14 [phone]

## 2014-11-22 DIAGNOSIS — R110 Nausea: Secondary | ICD-10-CM

## 2014-11-22 DIAGNOSIS — G501 Atypical facial pain: Secondary | ICD-10-CM

## 2014-11-22 LAB — CARBAMAZEPINE LEVEL, TOTAL: Carbamazepine Lvl: 10.3 ug/mL (ref 4.0–12.0)

## 2014-11-22 LAB — URINE CULTURE

## 2014-11-22 MED ORDER — PANTOPRAZOLE SODIUM 40 MG IV SOLR
40.0000 mg | Freq: Two times a day (BID) | INTRAVENOUS | Status: DC
  Administered 2014-11-22 – 2014-11-23 (×3): 40 mg via INTRAVENOUS
  Filled 2014-11-22 (×4): qty 40

## 2014-11-22 MED ORDER — DIPHENHYDRAMINE HCL 50 MG/ML IJ SOLN
25.0000 mg | Freq: Three times a day (TID) | INTRAMUSCULAR | Status: AC
  Administered 2014-11-22 – 2014-11-23 (×3): 25 mg via INTRAVENOUS
  Filled 2014-11-22 (×3): qty 1

## 2014-11-22 MED ORDER — MAGNESIUM SULFATE 2 GM/50ML IV SOLN
2.0000 g | Freq: Once | INTRAVENOUS | Status: AC
  Administered 2014-11-22: 2 g via INTRAVENOUS
  Filled 2014-11-22: qty 50

## 2014-11-22 MED ORDER — PROMETHAZINE HCL 25 MG/ML IJ SOLN
12.5000 mg | Freq: Once | INTRAMUSCULAR | Status: AC
  Administered 2014-11-22: 12.5 mg via INTRAVENOUS
  Filled 2014-11-22: qty 1

## 2014-11-22 MED ORDER — KETOROLAC TROMETHAMINE 30 MG/ML IJ SOLN
30.0000 mg | Freq: Three times a day (TID) | INTRAMUSCULAR | Status: AC
  Administered 2014-11-22 – 2014-11-23 (×3): 30 mg via INTRAVENOUS
  Filled 2014-11-22 (×3): qty 1

## 2014-11-22 MED ORDER — LAMOTRIGINE 25 MG PO TABS
25.0000 mg | ORAL_TABLET | Freq: Every day | ORAL | Status: DC
  Administered 2014-11-22 – 2014-11-23 (×2): 25 mg via ORAL
  Filled 2014-11-22 (×2): qty 1

## 2014-11-22 MED ORDER — METOCLOPRAMIDE HCL 5 MG/ML IJ SOLN
10.0000 mg | Freq: Three times a day (TID) | INTRAMUSCULAR | Status: AC
  Administered 2014-11-22 – 2014-11-23 (×3): 10 mg via INTRAVENOUS
  Filled 2014-11-22 (×3): qty 2

## 2014-11-22 NOTE — Plan of Care (Signed)
Problem: Phase I Progression Outcomes Goal: Pain controlled with appropriate interventions Outcome: Not Progressing Pt with consistent and progressive migraine headache, temporarily relieved by toradol.

## 2014-11-22 NOTE — Progress Notes (Signed)
[facility] Progress Note   Janice Morrow YIF:[id number] DOB: August 22, 1946 DOA: 11/20/2014 PCP: No primary care provider on file.  Brief narrative: Janice Morrow is a 69 y.o. female with history of GERD, peripheral vascular disease, chronic back pain, hx of trigeminal neuralgia admitted for sleepiness and confusion. Noted to have UTI   Subjective: Continues to have burning micturition but not a severe as a few days ago. Still nauseated and dizzy. Right face pain is back.   Assessment/Plan: Principal Problem:   Acute encephalopathy - appears to be resolved- may have been from UTI  Active Problems:   UTI (urinary tract infection) - with dysuria - cont Rocephin- culture reveals > 100,000 colonies of E coli  Dizziness- Nausea - orthostatic vitals negative - checked Tegretol level and it was high normal- it was given to treat/ prevent migraines - have d/c it as it is not helping and may be causing dizziness and her nausea- she is not vomiting and is eating most of her meals - check another level today - try IV Protonix for a few doses only  Trigeminal Neuralgia - it appears she has trigeminal neuralgia diagnosis for which the Tegretol was probably started but as mentioned above, holding it for now - states numerous medications have been tried by neurology most recent being Tegretol which is not helping - Toradol actually helps more- I have asked neuro to see her today and assist with management    Obesity Body mass index is 55.28 kg/(m^2).   Right breast nodule noted on CT - need outpt mammogram as discussed with her on last admission   Code Status: full code Family Communication:  Disposition Plan: home vs HHPT vs home with supervision- she is declining SNF- will order HHPT DVT prophylaxis: Lovenox Consultants: Procedures:  Antibiotics: Anti-infectives    Start     Dose/Rate Route Frequency Ordered Stop   11/21/14 1000  cefTRIAXone (ROCEPHIN) 1 g in dextrose 5 %  50 mL IVPB     1 g 100 mL/hr over 30 Minutes Intravenous Every 24 hours 11/20/14 1610     11/20/14 1430  cefTRIAXone (ROCEPHIN) 1 g in dextrose 5 % 50 mL IVPB     1 g 100 mL/hr over 30 Minutes Intravenous  Once 11/20/14 1429 11/20/14 1559      Objective: Filed Weights   11/20/14 1750  Weight: 150.685 kg (332 lb 3.2 oz)    Intake/Output Summary (Last 24 hours) at 11/22/14 1316 Last data filed at 11/22/14 0920  Gross per 24 hour  Intake   2080 ml  Output      0 ml  Net   2080 ml     Vitals Filed Vitals:   11/21/14 2037 11/21/14 2040 11/22/14 0029 11/22/14 0520  BP: 160/82  139/86 131/66  Pulse: 85  93 88  Temp: 98.2 F (36.8 C)   98.7 F (37.1 C)  TempSrc: Oral   Oral  Resp:      Height:      Weight:      SpO2:  98%  96%    Exam:  General:  Pt is alert, in pain when I evaluate her- had eaten most of her breakfast  HEENT: No icterus, No thrush  Cardiovascular: regular rate and rhythm, S1/S2 No murmur  Respiratory: clear to auscultation bilaterally   Abdomen: Soft, +Bowel sounds, non tender, non distended, no guarding  MSK: No LE edema, cyanosis or clubbing  Data Reviewed: Basic Metabolic Panel:  Recent Labs Lab  11/20/14 1411  NA 136  K 4.2  CL 100  CO2 28  GLUCOSE 141*  BUN 15  CREATININE 0.82  CALCIUM 8.8   Liver Function Tests:  Recent Labs Lab 11/20/14 1411  AST 19  ALT 20  ALKPHOS 60  BILITOT 0.5  PROT 7.5  ALBUMIN 4.1   No results for input(s): LIPASE, AMYLASE in the last 168 hours. No results for input(s): AMMONIA in the last 168 hours. CBC:  Recent Labs Lab 11/20/14 1411  WBC 7.5  HGB 13.3  HCT 42.2  MCV 91.9  PLT 140*   Cardiac Enzymes: No results for input(s): CKTOTAL, CKMB, CKMBINDEX, TROPONINI in the last 168 hours. BNP (last 3 results)  Recent Labs  10/19/14 2043 10/28/14 2158  BNP 51.8 39.4    ProBNP (last 3 results) No results for input(s): PROBNP in the last 8760 hours.  CBG: No results for  input(s): GLUCAP in the last 168 hours.  Recent Results (from the past 240 hour(s))  Culture, Urine     Status: None (Preliminary result)   Collection Time: 11/20/14  1:16 PM  Result Value Ref Range Status   Specimen Description URINE, CLEAN CATCH  Final   Special Requests NONE  Final   Colony Count   Final    >=100,000 COLONIES/ML Performed at [facility]    Culture   Final    ESCHERICHIA COLI Performed at [facility]    Report Status PENDING  Incomplete     Studies:  Recent x-ray studies have been reviewed in detail by the Attending Physician  Scheduled Meds:  Scheduled Meds: . cefTRIAXone (ROCEPHIN)  IV  1 g Intravenous Q24H  . enoxaparin (LOVENOX) injection  40 mg Subcutaneous Q24H   Continuous Infusions:   Time spent on care of this patient: 35 min   [place], MD 11/22/2014, 1:16 PM  LOS: 1 day   [facility] Office  [phone] Pager - Text Page per [email or web address]  If 7PM-7AM, please contact night-coverage [email or web address]

## 2014-11-22 NOTE — Consult Note (Signed)
Consult Reason for Consult: facial pain Referring Physician: Dr Wynelle Cleveland  CC: facial pain  HPI: Janice Morrow is an 69 y.o. female hx of PVD, trigeminal neuralgia admitted with confusion and gait instability. Found to have UTI and borderline high level of tegretol upon admission. While in hospital she notes that her facial pain (previously diagnosed as trigeminal neuralgia) has worsened. Her tegretol has been stopped due to concern that it was worsening her dizziness. Of note, PT evaluated patient today and reports she has increased dizziness with standing. Currently describes a right sided, "cluster migraine" that is 10/10 in severity. Notes +nausea and photophobia (though watching TV in no distress). Reports current facial pain is similar to episodes in the past.   Followed by [facility] for her facial pain. In the past has tried gabapentin, tramadol, topamax, botox, VPA, zanaflex and tegretol without symptomatic relief. She also takes norco 10 at home for symptomatic relief of chronic pain.   Tegretol level 11.8. UA positive for UTI with culture showing E Coli.   Past Medical History  Diagnosis Date  . Heart murmur   . Headache(784.0)   . COPD (chronic obstructive pulmonary disease)   . GERD (gastroesophageal reflux disease)   . PVD (peripheral vascular disease)   . Hyperlipidemia   . Cervicalgia   . Migraine without aura, with intractable migraine, so stated, without mention of status migrainosus   . PVD (peripheral vascular disease)   . Osteoarthritis   . Chronic back pain   . Trigeminal neuralgia   . Shortness of breath dyspnea     Past Surgical History  Procedure Laterality Date  . Dental surgery      Family History  Problem Relation Age of Onset  . Heart attack Father   . Heart disease    . Diabetes    . Diabetes Mother   . Diabetes Brother     Social History:  reports that she quit smoking about 4 years ago. Her smoking use included Cigarettes. She has never used smokeless  tobacco. She reports that she does not drink alcohol or use illicit drugs.  Allergies  Allergen Reactions  . Baclofen Other (See Comments)    Causes seizures    Medications:  Scheduled: . cefTRIAXone (ROCEPHIN)  IV  1 g Intravenous Q24H  . diphenhydrAMINE  25 mg Intravenous 3 times per day  . enoxaparin (LOVENOX) injection  40 mg Subcutaneous Q24H  . ketorolac  30 mg Intravenous 3 times per day  . lamoTRIgine  25 mg Oral Daily  . metoCLOPramide (REGLAN) injection  10 mg Intravenous 3 times per day  . pantoprazole (PROTONIX) IV  40 mg Intravenous Q12H    ROS: Out of a complete 14 system review, the patient complains of only the following symptoms, and all other reviewed systems are negative. +facial pain, dizziness  Physical Examination: Filed Vitals:   11/22/14 0520  BP: 131/66  Pulse: 88  Temp: 98.7 F (37.1 C)  Resp:    Physical Exam  Constitutional: She appears well-developed and well-nourished.  Psych: Affect appropriate to situation Eyes: No scleral injection HENT: No OP obstrucion Head: Normocephalic.  Cardiovascular: Normal rate and regular rhythm.  Respiratory: Effort normal and breath sounds normal.  GI: Soft. Bowel sounds are normal. No distension. There is no tenderness.  Skin: WDI  Neurologic Examination Mental Status: Alert, oriented, thought content appropriate.  Speech fluent without evidence of aphasia.  Able to follow 3 step commands without difficulty. Cranial Nerves: II: unable to visualize optic  discs, visual fields grossly normal, pupils equal, round, reactive to light and accommodation III,IV, VI: ptosis not present, extra-ocular motions intact bilaterally V,VII: smile symmetric, facial light touch sensation normal bilaterally VIII: hearing normal bilaterally IX,X: gag reflex present XI: trapezius strength/neck flexion strength normal bilaterally XII: tongue strength normal  Motor: Right : Upper extremity    Left:     Upper  extremity 5/5 deltoid       5/5 deltoid 5/5 biceps      5/5 biceps  5/5 triceps      5/5 triceps 5/5 hand grip      5/5 hand grip  Lower extremity     Lower extremity 5/5 hip flexor      5/5 hip flexor 5/5 quadricep      5/5 quadriceps  5/5 hamstrings     5/5 hamstrings 5/5 plantar flexion       5/5 plantar flexion 5/5 plantar extension     5/5 plantar extension Tone and bulk:normal tone throughout; no atrophy noted Sensory: Pinprick and light touch intact throughout, bilaterally Deep Tendon Reflexes: 2+ and symmetric throughout with absent AJs bilaterally Plantars: Right: downgoing   Left: downgoing Cerebellar: Mild intention tremor with FTN bilaterall,  and normal heel-to-shin test Gait: deferred  Laboratory Studies:   Basic Metabolic Panel:  Recent Labs Lab 11/20/14 1411  NA 136  K 4.2  CL 100  CO2 28  GLUCOSE 141*  BUN 15  CREATININE 0.82  CALCIUM 8.8    Liver Function Tests:  Recent Labs Lab 11/20/14 1411  AST 19  ALT 20  ALKPHOS 60  BILITOT 0.5  PROT 7.5  ALBUMIN 4.1   No results for input(s): LIPASE, AMYLASE in the last 168 hours. No results for input(s): AMMONIA in the last 168 hours.  CBC:  Recent Labs Lab 11/20/14 1411  WBC 7.5  HGB 13.3  HCT 42.2  MCV 91.9  PLT 140*    Cardiac Enzymes: No results for input(s): CKTOTAL, CKMB, CKMBINDEX, TROPONINI in the last 168 hours.  BNP: Invalid input(s): POCBNP  CBG: No results for input(s): GLUCAP in the last 168 hours.  Microbiology: Results for orders placed or performed during the hospital encounter of 11/20/14  Culture, Urine     Status: None (Preliminary result)   Collection Time: 11/20/14  1:16 PM  Result Value Ref Range Status   Specimen Description URINE, CLEAN CATCH  Final   Special Requests NONE  Final   Colony Count   Final    >=100,000 COLONIES/ML Performed at [facility]    Culture   Final    ESCHERICHIA COLI Performed at [facility]    Report Status  PENDING  Incomplete    Coagulation Studies: No results for input(s): LABPROT, INR in the last 72 hours.  Urinalysis:  Recent Labs Lab 11/20/14 1316  COLORURINE YELLOW  LABSPEC 1.015  PHURINE 7.5  GLUCOSEU NEGATIVE  HGBUR NEGATIVE  BILIRUBINUR NEGATIVE  KETONESUR NEGATIVE  PROTEINUR NEGATIVE  UROBILINOGEN 1.0  NITRITE POSITIVE*  LEUKOCYTESUR SMALL*    Lipid Panel:     Component Value Date/Time   CHOL 182 10/28/2014 2158   TRIG 109 10/28/2014 2158   HDL 52 10/28/2014 2158   CHOLHDL 3.5 10/28/2014 2158   VLDL 22 10/28/2014 2158   LDLCALC 108* 10/28/2014 2158    HgbA1C:  Lab Results  Component Value Date   HGBA1C 6.1* 10/28/2014    Urine Drug Screen:     Component Value Date/Time   LABOPIA NONE  DETECTED 11/20/2014 1316   COCAINSCRNUR NONE DETECTED 11/20/2014 1316   LABBENZ NONE DETECTED 11/20/2014 1316   AMPHETMU NONE DETECTED 11/20/2014 1316   THCU NONE DETECTED 11/20/2014 1316   LABBARB NONE DETECTED 11/20/2014 1316    Alcohol Level: No results for input(s): ETH in the last 168 hours.  Other results:  Imaging: Dg Chest 2 View  11/20/2014   CLINICAL DATA:  Weakness and shortness of breath  EXAM: CHEST  2 VIEW  COMPARISON:  10/28/2014, chest CT 10/19/2014  FINDINGS: Pulmonary arterial prominence with tapering is reidentified, which may be seen with pulmonary arterial hypertension. Mild cardiomegaly is reidentified. No new focal pulmonary opacity. No pleural effusion. No acute osseous finding.  IMPRESSION: Stable degree of central pulmonary arterial ectasia with rapid tapering which may be seen with pulmonary arterial hypertension. Venous congestion could appear similar.   Electronically Signed   By: Conchita Paris M.D.   On: 11/20/2014 13:55   Ct Head Wo Contrast  11/20/2014   CLINICAL DATA:  Lethargy and altered mental status for 1 day.  EXAM: CT HEAD WITHOUT CONTRAST  TECHNIQUE: Contiguous axial images were obtained from the base of the skull through the  vertex without intravenous contrast.  COMPARISON:  August 22, 2014  FINDINGS: There is age related volume loss. There is no intracranial mass hemorrhage, extra-axial fluid collection, or midline shift. The gray-white compartments appear normal. There is no acute appearing infarct. The bony calvarium appears intact. The mastoid air cells are clear.  IMPRESSION: Age related volume loss. No intracranial mass, hemorrhage, or focal gray -white compartment lesions/acute appearing infarct.   Electronically Signed   By: Lowella Grip III M.D.   On: 11/20/2014 14:30     Assessment/Plan:  68y/o woman with history of chronic headaches and trigeminal neuralgia admitted with lethargy and gait instability. Found to have UTI and borderline high level of tegretol. Currently with self-reported 10/10 headache and facial pain, similar to her prior headaches. Tegretol stopped due to lack of efficacy and concern it was worsening dizziness. She has been tried on multiple medications in the past without sustained benefit. Exam appears stable and patient reports headache consistent with prior headaches so will hold on brain imaging at this time.  -start lamictal 25mg  daily. Counseled patient on risk of rash and need to start on low dose and slowly titrate up -suggest toradol 30mg  IV q8hrs, reglan 10mg  IV q8hrs and benadryl 25mg  IV q8hrs x 3 doses each for headache relief -will monitor follow up tegretol level -UTI treatment per primary team -will need outpatient neurology follow up  Jim Like, DO [facility] [phone]  If 7pm- 7am, please page neurology on call as listed in [place]. 11/22/2014, 1:30 PM

## 2014-11-22 NOTE — Plan of Care (Signed)
Problem: Phase I Progression Outcomes Goal: OOB as tolerated unless otherwise ordered Outcome: Not Progressing Awaiting PT eval

## 2014-11-22 NOTE — Evaluation (Signed)
Physical Therapy Evaluation Patient Details Name: Janice Morrow MRN: [id number] DOB: September 22, 1945 Today's Date: 11/22/2014   History of Present Illness  69 yo female admitted with acute encephalopathy. Hx of PVD, chronic back pain, trigeminal neuralgia, COPD,morbid obesity  Clinical Impression  On eval, pt required Min assist +2 for safety for mobility-able to stand and take a few side steps along the side of bed with RW. Mobility limited by decreased level of arousal and pt report of dizziness with standing activity. Assisted pt back to supine at her request. Will continue to follow to assess mobility. May need to consider ST rehab placement.     Follow Up Recommendations SNF vs Home health PT;Supervision/Assistance - 24 hour (depending on progress)    Equipment Recommendations  None recommended by PT    Recommendations for Other Services OT consult     Precautions / Restrictions Precautions Precautions: Fall Precaution Comments: dizziness Restrictions Weight Bearing Restrictions: No      Mobility  Bed Mobility Overal bed mobility: Needs Assistance Bed Mobility: Supine to Sit;Sit to Supine     Supine to sit: Min guard;HOB elevated Sit to supine: Min guard;HOB elevated   General bed mobility comments: close guard for safety  Transfers Overall transfer level: Needs assistance Equipment used: Rolling walker (2 wheeled) Transfers: Sit to/from Stand Sit to Stand: Min assist;+2 physical assistance;+2 safety/equipment         General transfer comment: Assist to rise, stabilize, control descent.   Ambulation/Gait Ambulation/Gait assistance: Min assist;+2 physical assistance;+2 safety/equipment           General Gait Details: Pt declined ambulation on today. She did take several side steps towards HOB with RW. Pt c/o dizziness so deferred further activity  Stairs            Wheelchair Mobility    Modified Rankin (Stroke Patients Only)       Balance                                             Pertinent Vitals/Pain Pain Assessment: Faces Faces Pain Scale: Hurts whole lot Pain Location: headache Pain Descriptors / Indicators: Aching Pain Intervention(s): Limited activity within patient's tolerance;Repositioned    Home Living Family/patient expects to be discharged to:: Private residence Living Arrangements: Other relatives (brother) Available Help at Discharge: Family Type of Home: Apartment Home Access: Level entry     Home Layout: One level Home Equipment: Kasandra Knudsen - single point Additional Comments: States she cannot fit a walker in her house.    Prior Function Level of Independence: Independent with assistive device(s)         Comments: cane for mobility     Hand Dominance        Extremity/Trunk Assessment   Upper Extremity Assessment: Defer to OT evaluation           Lower Extremity Assessment: Generalized weakness      Cervical / Trunk Assessment: Normal  Communication   Communication: No difficulties  Cognition Arousal/Alertness: Lethargic Behavior During Therapy: WFL for tasks assessed/performed Overall Cognitive Status: Difficult to assess                      General Comments      Exercises        Assessment/Plan    PT Assessment Patient needs continued PT services  PT Diagnosis Difficulty  walking;Generalized weakness;Acute pain   PT Problem List Decreased activity tolerance;Decreased balance;Decreased mobility;Pain;Obesity  PT Treatment Interventions DME instruction;Gait training;Functional mobility training;Therapeutic activities;Balance training;Therapeutic exercise;Patient/family education   PT Goals (Current goals can be found in the Care Plan section) Acute Rehab PT Goals Patient Stated Goal: to rest/feel better PT Goal Formulation: With patient Time For Goal Achievement: 12/06/14 Potential to Achieve Goals: Good    Frequency Min 3X/week   Barriers  to discharge Decreased caregiver support      Co-evaluation               End of Session   Activity Tolerance: Patient limited by fatigue (Limited by dizziness) Patient left: in bed;with bed alarm set;with call bell/phone within reach           Time: 1111-1120 PT Time Calculation (min) (ACUTE ONLY): 9 min   Charges:   PT Evaluation $Initial PT Evaluation Tier I: 1 Procedure     PT G Codes:        Weston Anna, MPT Pager: [phone]

## 2014-11-22 NOTE — Progress Notes (Signed)
Patient was escorted to the bathroom using hospital front wheel walker with minimal assist only with sitting up.  Patient was able to independently clean herself after using restroom and ambulate independently to chair utilizing walker.  Patient currently seated in chair for 30 minutes resting with eyes closed without complaint.

## 2014-11-22 NOTE — Progress Notes (Signed)
Clinical Social Work Department BRIEF PSYCHOSOCIAL ASSESSMENT 11/22/2014  Patient:  Janice Morrow, Janice Morrow     Account Number:  [id number]     [place] date:  11/20/2014  Clinical Social Worker:  Maryln Manuel  Date/Time:  11/22/2014 01:14 PM  Referred by:  Physician  Date Referred:  11/22/2014 Referred for  SNF Placement   Other Referral:   Interview type:  Patient Other interview type:    PSYCHOSOCIAL DATA Living Status:  [place] Admitted from facility:   Level of care:   Primary support name:  Albertina Parr [place]/brother/[phone] Primary support relationship to patient:  S[place] Degree of support available:   pt reports adequate support    CURRENT CONCERNS Current Concerns  Post-Acute Placement   Other Concerns:    SOCIAL WORK ASSESSMENT / PLAN CSW reviewed chart and noted that PT recommending SNF vs Home health PT;Supervision/Assistance - 24 hour (depending on progress).    CSW met with pt at bedside. CSW introduced self and explained role. Pt reports that she lives at home with her brother. Pt shared that pt brother is at the home around the clock, but pt states that she does all her own care in the home. Pt shared that she was able to ambulate with a walker prior to admission. CSW discussed with pt regarding PT recommendation for rehab at SNF versus [facility] with 24 hour supervision. Pt states that she does not wish to go to a rehab facility. CSW provided support as she discussed that she feels that once she is feeling better that she will be able to get back to baseline with her ambulations. Pt states that she is agreeable to home health services. Pt shared that she has a follow up appointment with her primary care doctor on Thursday and CSW encouraged pt to keep appointment at this time until she is able to hear further from MD about anticipated discharge. Pt discussed that pt brother provides her transporation to appointments.    CSW notified RNCM that pt is declining SNF, but  agreeable to home health services.    No further social work needs identified at this time.    CSW signing off.   Assessment/plan status:  Psychosocial Support/Ongoing Assessment of Needs Other assessment/ plan:   discharge planning   Information/referral to community resources:   Referral to [facility] for home health needs.    PATIENT'S/FAMILY'S RESPONSE TO PLAN OF CARE: Pt alert and oriented x 4. Pt declining SNF at this time and feels that she will be able to continue to manage her care at home. Pt reports strong support from pt brother, but states that she does all of her own self care. Pt pleasant and appreciative of CSW visit and discussion of planning for disposition.    Alison Murray, MSW, [place] [facility] [phone]

## 2014-11-23 DIAGNOSIS — E669 Obesity, unspecified: Secondary | ICD-10-CM

## 2014-11-23 DIAGNOSIS — G500 Trigeminal neuralgia: Secondary | ICD-10-CM

## 2014-11-23 MED ORDER — PANTOPRAZOLE SODIUM 40 MG PO TBEC: 40.0000 mg | DELAYED_RELEASE_TABLET | Freq: Every day | ORAL | Status: DC

## 2014-11-23 MED ORDER — LAMOTRIGINE 25 MG PO TABS: 25.0000 mg | ORAL_TABLET | Freq: Every day | ORAL | Status: DC

## 2014-11-23 NOTE — Progress Notes (Signed)
Subjective: No overnight events. Headache improved but persists. Received toradol, reglan and benadryl x 3. Pain currently at a 6 to 7 out of 10.   Objective: Current vital signs: BP 124/74 mmHg  Pulse 88  Temp(Src) 97.5 F (36.4 C) (Oral)  Resp 18  Ht 5\' 5"  (1.651 m)  Wt 150.685 kg (332 lb 3.2 oz)  BMI 55.28 kg/m2  SpO2 94% Vital signs in last 24 hours: Temp:  [97.5 F (36.4 C)-98.9 F (37.2 C)] 97.5 F (36.4 C) (03/29 0557) Pulse Rate:  [87-90] 88 (03/29 0557) Resp:  [18] 18 (03/29 0557) BP: (106-149)/(51-74) 124/74 mmHg (03/29 0557) SpO2:  [94 %-97 %] 94 % (03/29 0557)  Intake/Output from previous day: 03/28 0701 - 03/29 0700 In: 240 [P.O.:240] Out: -  Intake/Output this shift:   Nutritional status: Diet Heart Room service appropriate?: Yes; Fluid consistency:: Thin Diet - low sodium heart healthy  Neurologic Exam: Mental Status: Alert, oriented, thought content appropriate. Speech fluent without evidence of aphasia.  Cranial Nerves: II: pupils equal, round, reactive to light and accommodation III,IV, VI: ptosis not present, extra-ocular motions intact bilaterally V,VII: smile symmetric, facial light touch sensation normal bilaterally Motor: Right :Upper extremityLeft: Upper extremity 5/5 deltoid 5/5 deltoid 5/5 biceps5/5 biceps  5/5 triceps5/5 triceps 5/5 hand grip5/5 hand grip Lower extremityLower extremity 5/5 hip flexor5/5 hip flexor 5/5 quadricep5/5  quadriceps  5/5 hamstrings5/5 hamstrings 5/5 plantar flexion 5/5 plantar flexion 5/5 plantar extension5/5 plantar extension Tone and bulk:normal tone throughout; no atrophy noted Sensory: Pinprick and light touch intact throughout, bilaterally  Lab Results: Basic Metabolic Panel:  Recent Labs Lab 11/20/14 1411  NA 136  K 4.2  CL 100  CO2 28  GLUCOSE 141*  BUN 15  CREATININE 0.82  CALCIUM 8.8    Liver Function Tests:  Recent Labs Lab 11/20/14 1411  AST 19  ALT 20  ALKPHOS 60  BILITOT 0.5  PROT 7.5  ALBUMIN 4.1   No results for input(s): LIPASE, AMYLASE in the last 168 hours. No results for input(s): AMMONIA in the last 168 hours.  CBC:  Recent Labs Lab 11/20/14 1411  WBC 7.5  HGB 13.3  HCT 42.2  MCV 91.9  PLT 140*    Cardiac Enzymes: No results for input(s): CKTOTAL, CKMB, CKMBINDEX, TROPONINI in the last 168 hours.  Lipid Panel: No results for input(s): CHOL, TRIG, HDL, CHOLHDL, VLDL, LDLCALC in the last 168 hours.  CBG: No results for input(s): GLUCAP in the last 168 hours.  Microbiology: Results for orders placed or performed during the hospital encounter of 11/20/14  Culture, Urine     Status: None   Collection Time: 11/20/14  1:16 PM  Result Value Ref Range Status   Specimen Description URINE, CLEAN CATCH  Final   Special Requests NONE  Final   Colony Count   Final    >=100,000 COLONIES/ML Performed at [facility]    Culture   Final    ESCHERICHIA COLI Performed at [facility]    Report Status 11/22/2014 FINAL  Final   Organism ID, Bacteria ESCHERICHIA COLI  Final      Susceptibility   Escherichia coli - MIC*    AMPICILLIN >=32 RESISTANT Resistant     CEFAZOLIN 8 SENSITIVE Sensitive     CEFTRIAXONE <=1 SENSITIVE Sensitive     CIPROFLOXACIN >=4 RESISTANT  Resistant     GENTAMICIN <=1 SENSITIVE Sensitive     LEVOFLOXACIN >=8 RESISTANT Resistant     NITROFURANTOIN <=16 SENSITIVE Sensitive  TOBRAMYCIN <=1 SENSITIVE Sensitive     TRIMETH/SULFA >=320 RESISTANT Resistant     PIP/TAZO <=4 SENSITIVE Sensitive     * ESCHERICHIA COLI    Coagulation Studies: No results for input(s): LABPROT, INR in the last 72 hours.  Imaging: No results found.  Medications:  Scheduled: . enoxaparin (LOVENOX) injection  40 mg Subcutaneous Q24H  . lamoTRIgine  25 mg Oral Daily  . pantoprazole (PROTONIX) IV  40 mg Intravenous Q12H    Assessment/Plan:  69y/o woman with history of chronic headaches and trigeminal neuralgia admitted with lethargy and gait instability. Found to have UTI and borderline high level of tegretol. Headache improved with headache cocktail and addition of lamictal. She has been tried on multiple medications in the past without sustained benefit. Exam appears stable and patient reports headache consistent with prior headaches so will hold on brain imaging at this time.  -continue lamictal 25mg  daily. Counseled patient on risk of rash and need to start on low dose and slowly titrate up as needed -UTI treatment per primary team -She is scheduled to see Dr Leonie Man at [facility] on May 10 at 3pm.    LOS: 2 days   Jim Like, DO [facility] [phone]  If 7pm- 7am, please page neurology on call as listed in [place]. 11/23/2014  9:53 AM

## 2014-11-23 NOTE — Progress Notes (Signed)
Pt discharged home with brother in stable condition. Discharge instructions and scripts given. Pt verbalized.  understanding 

## 2014-11-23 NOTE — Discharge Summary (Addendum)
Physician Discharge Summary  JAME SEELIG XBM:[id number] DOB: March 26, 1946 DOA: 11/20/2014  PCP: [name], MD  Admit date: 11/20/2014 Discharge date: 11/23/2014  Time spent: 50 minutes  Recommendations for Outpatient Follow-up:  1. She has an appt for Neuro- Dr Leonie Man in a couple of weeks   Discharge Condition: stable  Diet recommendation: low sodium, heart healthy  Discharge Diagnoses:  Principal Problem:   Acute encephalopathy Active Problems:   UTI (urinary tract infection)   Trigeminal neuralgia of right side of face   Obesity   Coronary atherosclerosis   COPD (chronic obstructive pulmonary disease)   Chronic back pain   History of present illness:  Janice Morrow is a 69 y.o. female with history of GERD, peripheral vascular disease, chronic back pain, hx of trigeminal neuralgia admitted for sleepiness and confusion. Noted to have UTI.  HPI is as noted: "69 year old obese female with history of GERD, peripheral vascular disease, chronic back pain, hx of trigeminal neuralgia. She was recently hospitalized for atypical chest pain and underwent nuclear stress test which was negative for ischemia. Patient was brought by EMS after her brother called saying patient was not feeling right since one day. She appeared sleepy and confused. As per EMS she was able to answer questions but was sleepy and weak. They noted an unremarkable bottle with 6-7 pills left which as per brother was hydrocodone. Patient was having dry heaves and out to the hospital.  Patient is sleepy but arousable and able to answer questions. On questioning why she came to the hospital she says he probably has pneumonia and ongoing mid back pain. She reports having increased frequency of urination and occasional burning. Reports she has poor appetite for the past 2 days. Denies any fevers or chills. Reports occasional headaches but no dizziness, chest pain, palpitations, shortness of breath, abdominal pain, diarrhea.  Reported nausea with dry heaving."  Hospital Course:   Principal Problem:  Acute encephalopathy - appears to be resolved- may have been from UTI vs related to Depakote vs excess Hydrocodone she had at home- we did not resume this and it was discussed with her to use it sparingly at home  Active Problems:  UTI (urinary tract infection) - with dysuria - has received 3 days of Rocephin- culture reveals > 100,000 colonies of E coli sensitive to Rocephin  Dizziness- Nausea - orthostatic vitals negative - checked Tegretol level and it was high normal at 11.8 (12 being upper limit of normal)- checked again the following day after holding it- the level had only come down to 10 - it was given by Neuro to treat Trigeminal Neuralgia - have d/c it as it is not helping and may be causing the dizziness and nausea- she states today that she is no longer dizzy- she is able to get up with minimal assistance - she does however continue to be nauseated and may remain so until the Tegretol levels come down further - she is not vomiting and is eating most of her meals -I have placed her on daily Protonix and given a prescription for a trial for 2 wks.   Trigeminal Neuralgia- chronic pain - this was a major issue during the hospital stay - it appears she has trigeminal neuralgia diagnosis for which the Tegretol was started but as mentioned above, holding it for now - states numerous medications have been tried by neurology most recent being Tegretol which is not helping -  I have asked neuro to assist with management as her  pain was quite severe- she was started on Lamictal- she admits that to pain has began to improve- he also gave her a "migraine cocktail" with Toradol, Reglan and Benadryl x 3 dose every 8 hrs which help her pain improve as well-    Obesity Body mass index is 55.28 kg/(m^2).   Right breast nodule noted on CT - needs outpt mammogram as discussed with her on last  admission    Procedures:  none  Consultations:  Neuro  Discharge Exam: Filed Weights   11/20/14 1750  Weight: 150.685 kg (332 lb 3.2 oz)   Filed Vitals:   11/23/14 0557  BP: 124/74  Pulse: 88  Temp: 97.5 F (36.4 C)  Resp: 18    General: AAO x 3, no distress Cardiovascular: RRR, no murmurs  Respiratory: clear to auscultation bilaterally GI: soft, non-tender, non-distended, bowel sound positive  Discharge Instructions You were cared for by a hospitalist during your hospital stay. If you have any questions about your discharge medications or the care you received while you were in the hospital after you are discharged, you can call the unit and asked to speak with the hospitalist on call if the hospitalist that took care of you is not available. Once you are discharged, your primary care physician will handle any further medical issues. Please note that NO REFILLS for any discharge medications will be authorized once you are discharged, as it is imperative that you return to your primary care physician (or establish a relationship with a primary care physician if you do not have one) for your aftercare needs so that they can reassess your need for medications and monitor your lab values.      Discharge Instructions    Diet - low sodium heart healthy    Complete by:  As directed      Increase activity slowly    Complete by:  As directed             Medication List    STOP taking these medications        carbamazepine 200 MG tablet  Commonly known as:  TEGRETOL      TAKE these medications        HYDROcodone-acetaminophen 10-325 MG per tablet  Commonly known as:  NORCO  Take 1 tablet by mouth every 6 (six) hours as needed for moderate pain.     lamoTRIgine 25 MG tablet  Commonly known as:  LAMICTAL  Take 1 tablet (25 mg total) by mouth daily.     pantoprazole 40 MG tablet  Commonly known as:  PROTONIX  Take 1 tablet (40 mg total) by mouth daily. Switch for  any other PPI at similar dose and frequency       Allergies  Allergen Reactions  . Baclofen Other (See Comments)    Causes seizures      The results of significant diagnostics from this hospitalization (including imaging, microbiology, ancillary and laboratory) are listed below for reference.    Significant Diagnostic Studies: Dg Chest 2 View  11/20/2014   CLINICAL DATA:  Weakness and shortness of breath  EXAM: CHEST  2 VIEW  COMPARISON:  10/28/2014, chest CT 10/19/2014  FINDINGS: Pulmonary arterial prominence with tapering is reidentified, which may be seen with pulmonary arterial hypertension. Mild cardiomegaly is reidentified. No new focal pulmonary opacity. No pleural effusion. No acute osseous finding.  IMPRESSION: Stable degree of central pulmonary arterial ectasia with rapid tapering which may be seen with pulmonary arterial hypertension. Venous congestion  could appear similar.   Electronically Signed   By: Conchita Paris M.D.   On: 11/20/2014 13:55   Dg Chest 2 View  10/28/2014   CLINICAL DATA:  69 year old female with chest pain and shortness of breath for 1 day.  EXAM: CHEST  2 VIEW  COMPARISON:  Chest x-ray 10/19/2014.  FINDINGS: Lung volumes are normal. No consolidative airspace disease. No pleural effusions. No evidence of pulmonary edema. Pulmonary venous congestion. Mild cardiomegaly. Upper mediastinal contours are within normal limits. Atherosclerosis in the thoracic aorta.  IMPRESSION: 1. Mild cardiomegaly with pulmonary venous congestion, but no frank pulmonary edema. 2. Atherosclerosis.   Electronically Signed   By: Vinnie Langton M.D.   On: 10/28/2014 14:55   Ct Head Wo Contrast  11/20/2014   CLINICAL DATA:  Lethargy and altered mental status for 1 day.  EXAM: CT HEAD WITHOUT CONTRAST  TECHNIQUE: Contiguous axial images were obtained from the base of the skull through the vertex without intravenous contrast.  COMPARISON:  August 22, 2014  FINDINGS: There is age related  volume loss. There is no intracranial mass hemorrhage, extra-axial fluid collection, or midline shift. The gray-white compartments appear normal. There is no acute appearing infarct. The bony calvarium appears intact. The mastoid air cells are clear.  IMPRESSION: Age related volume loss. No intracranial mass, hemorrhage, or focal gray -white compartment lesions/acute appearing infarct.   Electronically Signed   By: Lowella Grip III M.D.   On: 11/20/2014 14:30   Nm Myocar Multi W/spect W/wall Motion / Ef  10/30/2014   CLINICAL DATA:  Chest pain.  Heart murmur.  EXAM: MYOCARDIAL IMAGING WITH SPECT (REST AND PHARMACOLOGIC-STRESS - 2 DAY PROTOCOL)  GATED LEFT VENTRICULAR WALL MOTION STUDY  LEFT VENTRICULAR EJECTION FRACTION  TECHNIQUE: Standard myocardial SPECT imaging was performed after resting intravenous injection of 30 mCi Tc-85m sestamibi. Subsequently, on a second day, intravenous infusion of Lexiscan was performed under the supervision of the Cardiology staff. At peak effect of the drug, 30 mCi Tc-56m sestamibi was injected intravenously and standard myocardial SPECT imaging was performed. Quantitative gated imaging was also performed to evaluate left ventricular wall motion, and estimate left ventricular ejection fraction.  COMPARISON:  None.  FINDINGS: Perfusion: A small area of decreased activity is seen in the distal anteroapical wall of the left ventricle on both stress and rest images, which is likely related to breast attenuation artifact. No reversible myocardial perfusion defects are seen.  Wall Motion: No regional left ventricular wall motion abnormality identified. Moderate to severe left ventricular dilatation noted.  Left Ventricular Ejection Fraction: 58 %  End diastolic volume 947 ml  End systolic volume 73 ml  IMPRESSION: 1. Probable breast attenuation artifact. No definite reversible ischemia or infarction.  2. Normal left ventricular wall motion, with left ventricular dilatation noted.   3. Left ventricular ejection fraction 58%  4. Low-risk stress test findings*.  *2012 Appropriate Use Criteria for Coronary Revascularization Focused Update: J Am Coll Cardiol. 0962;83(6):629-476. http://content.airportbarriers.com.aspx?articleid=1201161   Electronically Signed   By: Earle Gell M.D.   On: 10/30/2014 12:22   US Abdomen Limited Ruq  10/28/2014   CLINICAL DATA:  Right upper quadrant pain  EXAM: US ABDOMEN LIMITED - RIGHT UPPER QUADRANT  COMPARISON:  None.  FINDINGS: Gallbladder:  Large 2 cm gallstone is noted without complicating factors.  Common bile duct:  Diameter: 4.9 mm.  Liver:  Increased echogenicity is noted consistent with fatty infiltration.  IMPRESSION: Fatty liver.  Cholelithiasis without complicating factors.   Electronically  Signed   By: Inez Catalina M.D.   On: 10/28/2014 20:42    Microbiology: Recent Results (from the past 240 hour(s))  Culture, Urine     Status: None   Collection Time: 11/20/14  1:16 PM  Result Value Ref Range Status   Specimen Description URINE, CLEAN CATCH  Final   Special Requests NONE  Final   Colony Count   Final    >=100,000 COLONIES/ML Performed at [facility]    Culture   Final    ESCHERICHIA COLI Performed at [facility]    Report Status 11/22/2014 FINAL  Final   Organism ID, Bacteria ESCHERICHIA COLI  Final      Susceptibility   Escherichia coli - MIC*    AMPICILLIN >=32 RESISTANT Resistant     CEFAZOLIN 8 SENSITIVE Sensitive     CEFTRIAXONE <=1 SENSITIVE Sensitive     CIPROFLOXACIN >=4 RESISTANT Resistant     GENTAMICIN <=1 SENSITIVE Sensitive     LEVOFLOXACIN >=8 RESISTANT Resistant     NITROFURANTOIN <=16 SENSITIVE Sensitive     TOBRAMYCIN <=1 SENSITIVE Sensitive     TRIMETH/SULFA >=320 RESISTANT Resistant     PIP/TAZO <=4 SENSITIVE Sensitive     * ESCHERICHIA COLI     Labs: Basic Metabolic Panel:  Recent Labs Lab 11/20/14 1411  NA 136  K 4.2  CL 100  CO2 28  GLUCOSE 141*  BUN 15   CREATININE 0.82  CALCIUM 8.8   Liver Function Tests:  Recent Labs Lab 11/20/14 1411  AST 19  ALT 20  ALKPHOS 60  BILITOT 0.5  PROT 7.5  ALBUMIN 4.1   No results for input(s): LIPASE, AMYLASE in the last 168 hours. No results for input(s): AMMONIA in the last 168 hours. CBC:  Recent Labs Lab 11/20/14 1411  WBC 7.5  HGB 13.3  HCT 42.2  MCV 91.9  PLT 140*   Cardiac Enzymes: No results for input(s): CKTOTAL, CKMB, CKMBINDEX, TROPONINI in the last 168 hours. BNP: BNP (last 3 results)  Recent Labs  10/19/14 2043 10/28/14 2158  BNP 51.8 39.4    ProBNP (last 3 results) No results for input(s): PROBNP in the last 8760 hours.  CBG: No results for input(s): GLUCAP in the last 168 hours.     SignedDebbe Odea, MD [facility] 11/23/2014, 11:53 AM

## 2014-11-23 NOTE — Care Management Note (Signed)
CARE MANAGEMENT NOTE 11/23/2014  Patient:  Janice Morrow, Janice Morrow   Account Number:  [id number]  Date Initiated:  11/23/2014  Documentation initiated by:  Marney Doctor  Subjective/Objective Assessment:   69yo admitted with Acute encephalopathy     Action/Plan:   From home with brother   Anticipated DC Date:  11/23/2014   Anticipated DC Plan:  [place]  CM consult      [facility] Choice  HOME HEALTH   Choice offered to / List presented to:  C-1 Patient           Status of service:  In process, will continue to follow Medicare Important Message given?   (If response is "NO", the following Medicare IM given date fields will be blank) Date Medicare IM given:   Medicare IM given by:   Date Additional Medicare IM given:   Additional Medicare IM given by:    Discharge Disposition:    Per UR Regulation:  Reviewed for med. necessity/level of care/duration of stay  If discussed at [place] of Stay Meetings, dates discussed:    Comments:  11/23/14 Marney Doctor RN,BSN,NCM [phone] PT recommending HHPT and MD order written for HHPT. Met with pt at bedside to offer choice. Pt states she does not want anyone coming to her house and declines HHPT. Pt states she has a cane at home and gets around fine with it. Pt appreciates CM involvement.

## 2014-11-25 ENCOUNTER — Ambulatory Visit (INDEPENDENT_AMBULATORY_CARE_PROVIDER_SITE_OTHER): Payer: Medicare Other | Admitting: Internal Medicine

## 2014-11-25 ENCOUNTER — Encounter: Payer: Self-pay | Admitting: Internal Medicine

## 2014-11-25 VITALS — BP 124/88 | HR 95 | Temp 98.5°F | Ht 65.0 in | Wt 334.2 lb

## 2014-11-25 DIAGNOSIS — N6310 Unspecified lump in the right breast, unspecified quadrant: Secondary | ICD-10-CM | POA: Insufficient documentation

## 2014-11-25 DIAGNOSIS — Z23 Encounter for immunization: Secondary | ICD-10-CM | POA: Diagnosis not present

## 2014-11-25 DIAGNOSIS — N63 Unspecified lump in breast: Secondary | ICD-10-CM | POA: Diagnosis not present

## 2014-11-25 NOTE — Patient Instructions (Signed)
Breast Cyst A breast cyst is a sac in the breast that is filled with fluid. Breast cysts are common in women. Women can have one or many cysts. When the breasts contain many cysts, it is usually due to a noncancerous (benign) condition called fibrocystic change. These lumps form under the influence of female hormones (estrogen and progesterone). The lumps are most often located in the upper, outer portion of the breast. They are often more swollen, painful, and tender before your period starts. They usually disappear after menopause, unless you are on hormone therapy.  There are several types of cysts:  Macrocyst. This is a cyst that is about 2 in. (5.1 cm) in diameter.   Microcyst. This is a tiny cyst that you cannot feel but can be seen with a mammogram or an ultrasound.   Galactocele. This is a cyst containing milk that may develop if you suddenly stop breastfeeding.   Sebaceous cyst of the skin. This type of cyst is not in the breast tissue itself. Breast cysts do not increase your risk of breast cancer. However, they must be monitored closely because they can be cancerous.  CAUSES  It is not known exactly what causes a breast cyst to form. Possible causes include:  An overgrowth of milk glands and connective tissue in the breast can block the milk glands, causing them to fill with fluid.   Scar tissue in the breast from previous surgery may block the glands, causing a cyst.  RISK FACTORS Estrogen may influence the development of a breast cyst.  SIGNS AND SYMPTOMS   Feeling a smooth, round, soft lump (like a grape) in the breast that is easily moveable.   Breast discomfort or pain.  Increase in size of the lump before your menstrual period and decrease in its size after your menstrual period.  DIAGNOSIS  A cyst can be felt during a physical exam by your health care provider. A breast X-ray exam (mammogram) and ultrasonography will be done to confirm the diagnosis. Fluid may  be removed from the cyst with a needle (fine needle aspiration) to make sure the cyst is not cancerous.  TREATMENT  Treatment may not be necessary. Your health care provider may monitor the cyst to see if it goes away on its own. If treatment is needed, it may include:  Hormone treatment.   Needle aspiration. There is a chance of the cyst coming back after aspiration.   Surgery to remove the whole cyst.  HOME CARE INSTRUCTIONS   Keep all follow-up appointments with your health care provider.  See your health care provider regularly:  Get a yearly exam by your health care provider.  Have a clinical breast exam by a health care provider every 1-3 years if you are 20-40 years of age. After age 40 years, you should have the exam every year.   Get mammogram tests as directed by your health care provider.   Understand the normal appearance and feel of your breasts and perform breast self-exams.   Only take over-the-counter or prescription medicines as directed by your health care provider.   Wear a supportive bra, especially when exercising.   Avoid caffeine.   Reduce your salt intake, especially before your menstrual period. Too much salt can cause fluid retention, breast swelling, and discomfort.  SEEK MEDICAL CARE IF:   You feel, or think you feel, a lump in your breast.   You notice that both breasts look or feel different than usual.   Your   breast is still causing pain after your menstrual period is over.   You need medicine for breast pain and swelling that occurs with your menstrual period.  SEEK IMMEDIATE MEDICAL CARE IF:   You have severe pain, tenderness, redness, or warmth in your breast.   You have nipple discharge or bleeding.   Your breast lump becomes hard and painful.   You find new lumps or bumps that were not there before.   You feel lumps in your armpit (axilla).   You notice dimpling or wrinkling of the breast or nipple.   You  have a fever.  MAKE SURE YOU:  Understand these instructions.  Will watch your condition.  Will get help right away if you are not doing well or get worse. Document Released: 08/13/2005 Document Revised: 04/15/2013 Document Reviewed: 03/12/2013 ExitCare Patient Information 2015 [employer]. This information is not intended to replace advice given to you by your health care provider. Make sure you discuss any questions you have with your health care provider.  

## 2014-11-25 NOTE — Progress Notes (Signed)
Subjective:    Patient ID: Janice Morrow, female    DOB: 22-Jan-1946, 69 y.o.   MRN: [id number]  HPI Comments: She returns for f/up after a recent admission for CP with a negative work-up, she had a CT done that revealed a 2 cm lump in her right breast. She has not felt a lump and denies any nipple bleeding or discharge. The skin on her breast appears normal to her. She has had breast biopsies done years ago that were normal.     Review of Systems  Constitutional: Negative.  Negative for fever, chills, diaphoresis, appetite change and fatigue.  HENT: Negative.   Eyes: Negative.   Respiratory: Negative.  Negative for cough, choking, chest tightness, shortness of breath and stridor.   Cardiovascular: Negative.  Negative for chest pain, palpitations and leg swelling.  Gastrointestinal: Negative.  Negative for nausea, vomiting, abdominal pain, diarrhea, constipation and blood in stool.  Endocrine: Negative.   Genitourinary: Negative.   Musculoskeletal: Positive for back pain and arthralgias. Negative for myalgias, joint swelling, gait problem, neck pain and neck stiffness.  Skin: Negative.   Allergic/Immunologic: Negative.   Neurological: Negative.   Hematological: Negative.  Negative for adenopathy. Does not bruise/bleed easily.  Psychiatric/Behavioral: Negative.        Objective:   Physical Exam  Constitutional: She is oriented to person, place, and time. She appears well-developed and well-nourished. No distress.  HENT:  Head: Normocephalic and atraumatic.  Mouth/Throat: Oropharynx is clear and moist. No oropharyngeal exudate.  Eyes: Conjunctivae are normal. Right eye exhibits no discharge. Left eye exhibits no discharge. No scleral icterus.  Neck: Normal range of motion. Neck supple. No JVD present. No tracheal deviation present. No thyromegaly present.  Cardiovascular: Normal rate, regular rhythm, normal heart sounds and intact distal pulses.  Exam reveals no gallop and no  friction rub.   No murmur heard. Pulses:      Carotid pulses are 1+ on the right side, and 1+ on the left side.      Radial pulses are 1+ on the right side, and 1+ on the left side.       Femoral pulses are 1+ on the right side, and 1+ on the left side.      Popliteal pulses are 1+ on the right side, and 1+ on the left side.       Dorsalis pedis pulses are 1+ on the right side, and 1+ on the left side.       Posterior tibial pulses are 1+ on the right side, and 1+ on the left side.  Pulmonary/Chest: Effort normal and breath sounds normal. No stridor. No respiratory distress. She has no wheezes. She has no rales. Chest wall is not dull to percussion. She exhibits no mass, no tenderness, no bony tenderness, no laceration, no crepitus, no edema, no deformity, no swelling and no retraction. Right breast exhibits no inverted nipple, no mass, no nipple discharge, no skin change and no tenderness. Left breast exhibits no inverted nipple, no mass, no nipple discharge, no skin change and no tenderness. Breasts are symmetrical.  Abdominal: Soft. Bowel sounds are normal. She exhibits no distension and no mass. There is no tenderness. There is no rebound and no guarding.  Musculoskeletal: Normal range of motion. She exhibits no edema or tenderness.  Lymphadenopathy:    She has no cervical adenopathy.  Neurological: She is oriented to person, place, and time.  Skin: Skin is warm and dry. No rash noted. She is not  diaphoretic. No erythema. No pallor.  Vitals reviewed.     Lab Results  Component Value Date   WBC 7.5 11/20/2014   HGB 13.3 11/20/2014   HCT 42.2 11/20/2014   PLT 140* 11/20/2014   GLUCOSE 141* 11/20/2014   CHOL 182 10/28/2014   TRIG 109 10/28/2014   HDL 52 10/28/2014   LDLCALC 108* 10/28/2014   ALT 20 11/20/2014   AST 19 11/20/2014   NA 136 11/20/2014   K 4.2 11/20/2014   CL 100 11/20/2014   CREATININE 0.82 11/20/2014   BUN 15 11/20/2014   CO2 28 11/20/2014   TSH 4.212  10/28/2014   INR 1.12 08/22/2014   HGBA1C 6.1* 10/28/2014      Assessment & Plan:

## 2014-11-25 NOTE — Progress Notes (Signed)
Pre visit review using our clinic review tool, if applicable. No additional management support is needed unless otherwise documented below in the visit note. 

## 2014-11-26 ENCOUNTER — Encounter: Payer: Self-pay | Admitting: Internal Medicine

## 2014-11-26 NOTE — Assessment & Plan Note (Signed)
There is no palpable mass that I feel today Will get a diagnostic mammogram done and consider a biopsy

## 2014-12-14 DIAGNOSIS — M797 Fibromyalgia: Secondary | ICD-10-CM | POA: Diagnosis not present

## 2014-12-14 DIAGNOSIS — Z79899 Other long term (current) drug therapy: Secondary | ICD-10-CM | POA: Diagnosis not present

## 2014-12-14 DIAGNOSIS — M545 Low back pain: Secondary | ICD-10-CM | POA: Diagnosis not present

## 2014-12-14 DIAGNOSIS — G894 Chronic pain syndrome: Secondary | ICD-10-CM | POA: Diagnosis not present

## 2014-12-19 ENCOUNTER — Other Ambulatory Visit: Payer: Self-pay | Admitting: Internal Medicine

## 2014-12-22 ENCOUNTER — Other Ambulatory Visit: Payer: Self-pay | Admitting: Internal Medicine

## 2014-12-22 DIAGNOSIS — N6310 Unspecified lump in the right breast, unspecified quadrant: Secondary | ICD-10-CM

## 2015-01-02 ENCOUNTER — Other Ambulatory Visit: Payer: Self-pay | Admitting: Internal Medicine

## 2015-01-04 ENCOUNTER — Encounter: Payer: Self-pay | Admitting: Neurology

## 2015-01-04 ENCOUNTER — Ambulatory Visit (INDEPENDENT_AMBULATORY_CARE_PROVIDER_SITE_OTHER): Payer: Medicare Other | Admitting: Neurology

## 2015-01-04 VITALS — BP 148/86 | HR 80

## 2015-01-04 DIAGNOSIS — G44041 Chronic paroxysmal hemicrania, intractable: Secondary | ICD-10-CM | POA: Diagnosis not present

## 2015-01-04 NOTE — Progress Notes (Signed)
PATIENT: Janice Morrow DOB: Apr 26, 1946  REASON FOR VISIT: routine follow up for trigeminal neuralgia HISTORY FROM: patient  HISTORY OF PRESENT ILLNESS: PRIOR HPI 11/11/12 (PS): 69 year old lady with chronic headaches likely transform migraines with and algesic rebound. She has failed trials of tramadol, trexomet,tylenol,, goody powder, Percocet, Topamax, Botox, Depakote and Zanaflex in the past. She has underlying sleep apnea and musculoskeletal neck and shoulder pain as contributing triggers for her headache.  She returns today for followup after last visit on 06/30/12 requesting urgent appointment because of worsening headaches. She states for the last couple of months headaches have gotten worse and have now become daily. She has been taking migraine Excedrin on a daily basis for couple of weeks without much relief and has now switched to taking Goody powders with similar results. She did obtain relief with Zanaflex which had prescribed in the past but her insurance company is refusing to cover it for headache. She has not been doing regular neck stretching exercises of participating in any stress relaxation activities.  UPDATE 05/04/13 (LL): Patient returns for follow up for headaches. She states that the Depakote given at last visit did not provide any relief. The pain is right-sided, is a sharp shooting pain that starts in the right temporal region that comes around the face to the cheek and mandibular area. She can feel pulsating pain in her temple and the pain is stabbing in quality. She has pain in the jaw which makes it difficult to eat. She also has intermittent numbness under and behind her right ear. She had returned to [place] for a while but states she has not taken any in 2 weeks. Denies nausea, photophobia, or phonophobia.  Update 06/17/13 (PS): She returns for followup after last visit on 05/04/13 with Jeani Hawking, nurse practitioner. She states improvement in her headaches after  starting gabapentin which she takes 300 mg 3 times daily. She has had these headaches only twice in October so far and twice in last month as well. She has discontinued Zanaflex and does not take it anymore. She did have lab work on 05/04/13 which included ESR, CBC and BMP which were normal. She states she's lost 15 pounds and seemed surprised and happy about it. She has no new complaints today.  Update 11/26/13 (LL): She returns for followup after last visit on 05/04/13 with Dr. Leonie Man. She states that the gabapentin is not helping her headaches anymore, but she continues to take it. She is still having about 4 or 5 days where she has headaches a week, episode lasting 30-45 minutes of stabbing pain. She also complains today of severe low back pain that is chronic in nature due to disc herniation in the lumbar spine area. She reports taking an average of 2 goody powders per day either for headache or for back pain.   Update 03/03/14 (LL): She returns for followup after last visit on 11/26/13 with me. Since last visit, she has been to a pain clinic for treatment of her lower back pain but she cannot tell me which one.  MRI lumbar spine was done which showed Moderate spinal stenosis at L3-4, Mild spinal stenosis L4-5, and Spondylosis and foraminal narrowing bilaterally L5-S1.  Her facial pain is still not under good control, and she has been taking 3 tablets of Carbamazepine on some days.   Update 01/04/2015 : She returns for follow-up after last visit in July 2015. She continues to have right hemicranial headaches which occur 2-3 times per  week and last for half an hour to a few hours. She was prescribed carbamazepine which did not work well and hence she stopped it. Recently she started lamotrigine 25 mg daily which is tolerating well. She was admitted in February 2016 for chest pain but had extensive negative workup. She was admitted without abnormal mental status in March 2016 which was felt to be related to baclofen  started in the pain clinic. She has failed trials of multiple medications for her headaches including Botox.. Review of Systems  A 14 system review of systems is Positive for back pain, joint pain, restless legs,  walking difficulty,cough,wheeezing,chest tightness,dizziness,headache,seizure and alll other systems negative   ALLERGIES: Allergies  Allergen Reactions  . Baclofen Other (See Comments)    Causes seizures    HOME MEDICATIONS: Outpatient Prescriptions Prior to Visit  Medication Sig Dispense Refill  . lamoTRIgine (LAMICTAL) 25 MG tablet Take 1 tablet (25 mg total) by mouth daily. 30 tablet 0  . pantoprazole (PROTONIX) 40 MG tablet Take 1 tablet (40 mg total) by mouth daily. Switch for any other PPI at similar dose and frequency (Patient not taking: Reported on 01/04/2015) 15 tablet 0   No facility-administered medications prior to visit.    PHYSICAL EXAM Filed Vitals:   01/04/15 1550  BP: 148/86  Pulse: 80   There is no weight on file to calculate BMI. No exam data present No flowsheet data found.  No flowsheet data found.   Physical Exam  [name] who appears not to be in distress. Head is non traumatic. Neck is supple. There is mild spasm of the posterior neck and upper scapular muscles which appear tight. Cardiac exam with systolic murmur. Lungs are clear to auscultation. Skin shows no rash. Hearing appears normal   Neurologic Exam  Awake alert oriented x3 with normal speech and language function. Intact attention, registration and recall.  Cranial nerve exam reveals full range of eye movements without nystagmus. Pupils are equal reactive. Face is symmetric without weakness. Palatal moments normal. Tongue is midline.  Motor system exam reveals symmetric upper and lower extremity strength without any drift or focal weakness. Tone is normal.  Deep tendon reflexes are 2 per symmetric except ankle jerks are depressed.  Sensory system exam  reveals normal touch, pinprick sensation and position and vibration.  Gait is not tested today, she is in wheelchair.   ASSESSMENT: 69 year-old lady with chronic headaches likely transformed migraine headaches with analgesic rebound who has failed trials of multiple medications in the past. Change in headache charecter to right hemicranial suggests chronic paroxysmal hemicrania. PLAN: I had a long discussion with the patient with regards to her refractory headaches which have now assumed hemicranial cluster migraine character. Increase lamotrigine to 25 mg twice daily for one week and then 50 mg twice daily. Refer her to Dr. Jaynee Eagles to consider sphenocath treatment. Continue management in  plain clinic                                  No Follow-up on file.  Antony Contras, MD  01/04/2015, 9:56 PM [facility] [address], [place], [place] [address] [phone]  Note: This document was prepared with digital dictation and possible smart phrase technology. Any transcriptional errors that result from this process are unintentional.

## 2015-01-04 NOTE — Patient Instructions (Signed)
I had a long discussion with the patient with regards to her refractory headaches which have now assumed hemicranial cluster migraine character. Increase lamotrigine to 25 mg twice daily for one week and then 50 mg twice daily. Refer her to Dr. Jaynee Eagles to consider sphenocath treatment. Continue management in  plain clinic

## 2015-01-11 ENCOUNTER — Ambulatory Visit: Payer: Medicare Other | Admitting: Neurology

## 2015-01-13 ENCOUNTER — Ambulatory Visit (INDEPENDENT_AMBULATORY_CARE_PROVIDER_SITE_OTHER): Payer: Medicare Other | Admitting: Neurology

## 2015-01-13 ENCOUNTER — Encounter: Payer: Self-pay | Admitting: Neurology

## 2015-01-13 VITALS — BP 153/89 | HR 70 | Temp 98.4°F | Ht 65.0 in | Wt 331.0 lb

## 2015-01-13 DIAGNOSIS — G44091 Other trigeminal autonomic cephalgias (TAC), intractable: Secondary | ICD-10-CM

## 2015-01-13 NOTE — Progress Notes (Signed)
[facility]    Provider:  Dr Jaynee Eagles Referring Provider: Janith Lima, MD Primary Care Physician:  [name], MD  CC:  Chronic headaches  HPI:  Janice Morrow is a 69 y.o. female here as a follow up to try numbing of the sphenopalatine ganglion due to refractory intractable headaches. She reports that she has cluster migraines. When she has the headache, she has severe pain, eyes watering, nose runs on the right side only. Happens 3-4 times a week, Lasts 20-40 minutes. Smells trigger the symptoms. Pulsating, pressure.  She has taken multiple medications including tramadol, treximet,tylenol,goody powder, Percocet, Topamax, Botox, Depakote and Zanaflex, baclofen, Tegretol, gabapentin,  in the past. She is on Lamictal. She has underlying sleep apnea and musculoskeletal neck and shoulder pain as contributing triggers for her headache.   Denied sinus pain, sinus drainage and states she does not have a sinus infection or history therof.   Review of Systems: Patient complains of symptoms per HPI as well as the following symptoms: Chest tightness, chest pain, murmur, restless leg, dizziness, headache, seizure. Pertinent negatives per HPI. All others negative.   History   Social History  . Marital Status: Single    Spouse Name: N/A  . Number of Children: 1  . Years of Education: 12th   Occupational History  . DISABLED    Social History Main Topics  . Smoking status: Former Smoker    Types: Cigarettes    Quit date: 08/22/2010  . Smokeless tobacco: Never Used  . Alcohol Use: No  . Drug Use: No  . Sexual Activity: No   Other Topics Concern  . Not on file   Social History Narrative   Patient lives at home with sister.   Caffeine Use: 5-6 20oz bottle sodas daily    Family History  Problem Relation Age of Onset  . Heart attack Father   . Heart disease    . Diabetes    . Diabetes Mother   . Dementia Mother   . Diabetes Brother     Past Medical History   Diagnosis Date  . Heart murmur   . Headache(784.0)   . COPD (chronic obstructive pulmonary disease)   . GERD (gastroesophageal reflux disease)   . PVD (peripheral vascular disease)   . Hyperlipidemia   . Cervicalgia   . Migraine without aura, with intractable migraine, so stated, without mention of status migrainosus   . PVD (peripheral vascular disease)   . Osteoarthritis   . Chronic back pain   . Trigeminal neuralgia   . Shortness of breath dyspnea   . Seizures 07/2014, 10/2014    Past Surgical History  Procedure Laterality Date  . Dental surgery      Current Outpatient Prescriptions  Medication Sig Dispense Refill  . HYDROcodone-acetaminophen (NORCO) 10-325 MG per tablet Take 1 tablet by mouth as needed.     . lamoTRIgine (LAMICTAL) 25 MG tablet Take 1 tablet (25 mg total) by mouth daily. 30 tablet 0  . pantoprazole (PROTONIX) 40 MG tablet Take 1 tablet (40 mg total) by mouth daily. Switch for any other PPI at similar dose and frequency (Patient not taking: Reported on 01/04/2015) 15 tablet 0   No current facility-administered medications for this visit.    Allergies as of 01/13/2015 - Review Complete 01/13/2015  Allergen Reaction Noted  . Baclofen Other (See Comments) 10/28/2014    Vitals: Wt 331 lb (150.141 kg) Last Weight:  Wt Readings from Last 1 Encounters:  01/13/15 331 lb (  150.141 kg)   Last Height:   Ht Readings from Last 1 Encounters:  11/25/14 5\' 5"  (1.651 m)   Attempted Sphenocath, patient could not tolerate laying flat or having anything in her nose    Assessment/Plan:    Janice Morrow is a 69 y.o. 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 procedure was attempted however patient could not tolerate it.   Sarina Ill, MD  [facility] [address] [place], [place] [address]  Phone  [phone] Fax [phone]  A total of 15 minutes was spent face-to-face with this patient. Over half this time was spent on counseling patient on the migraine diagnosis and different diagnostic and therapeutic options available.

## 2015-01-18 DIAGNOSIS — M1990 Unspecified osteoarthritis, unspecified site: Secondary | ICD-10-CM | POA: Diagnosis not present

## 2015-01-18 DIAGNOSIS — G629 Polyneuropathy, unspecified: Secondary | ICD-10-CM | POA: Diagnosis not present

## 2015-01-18 DIAGNOSIS — G894 Chronic pain syndrome: Secondary | ICD-10-CM | POA: Diagnosis not present

## 2015-01-18 DIAGNOSIS — M545 Low back pain: Secondary | ICD-10-CM | POA: Diagnosis not present

## 2015-01-18 DIAGNOSIS — Z79899 Other long term (current) drug therapy: Secondary | ICD-10-CM | POA: Diagnosis not present

## 2015-01-18 DIAGNOSIS — M4697 Unspecified inflammatory spondylopathy, lumbosacral region: Secondary | ICD-10-CM | POA: Diagnosis not present

## 2015-02-22 DIAGNOSIS — M4697 Unspecified inflammatory spondylopathy, lumbosacral region: Secondary | ICD-10-CM | POA: Diagnosis not present

## 2015-02-22 DIAGNOSIS — M25569 Pain in unspecified knee: Secondary | ICD-10-CM | POA: Diagnosis not present

## 2015-02-22 DIAGNOSIS — G894 Chronic pain syndrome: Secondary | ICD-10-CM | POA: Diagnosis not present

## 2015-02-22 DIAGNOSIS — Z79899 Other long term (current) drug therapy: Secondary | ICD-10-CM | POA: Diagnosis not present

## 2015-02-22 DIAGNOSIS — M1990 Unspecified osteoarthritis, unspecified site: Secondary | ICD-10-CM | POA: Diagnosis not present

## 2015-02-22 DIAGNOSIS — G629 Polyneuropathy, unspecified: Secondary | ICD-10-CM | POA: Diagnosis not present

## 2015-03-03 ENCOUNTER — Ambulatory Visit: Payer: Medicare Other | Admitting: Internal Medicine

## 2015-03-08 ENCOUNTER — Encounter: Payer: Self-pay | Admitting: Internal Medicine

## 2015-03-08 ENCOUNTER — Ambulatory Visit (INDEPENDENT_AMBULATORY_CARE_PROVIDER_SITE_OTHER): Payer: Medicare Other | Admitting: Internal Medicine

## 2015-03-08 VITALS — BP 130/90 | HR 85 | Temp 98.7°F | Resp 16 | Ht 65.0 in | Wt 338.0 lb

## 2015-03-08 DIAGNOSIS — J410 Simple chronic bronchitis: Secondary | ICD-10-CM | POA: Diagnosis not present

## 2015-03-08 DIAGNOSIS — N63 Unspecified lump in breast: Secondary | ICD-10-CM

## 2015-03-08 DIAGNOSIS — N6310 Unspecified lump in the right breast, unspecified quadrant: Secondary | ICD-10-CM | POA: Insufficient documentation

## 2015-03-08 DIAGNOSIS — Z1231 Encounter for screening mammogram for malignant neoplasm of breast: Secondary | ICD-10-CM

## 2015-03-08 MED ORDER — UMECLIDINIUM-VILANTEROL 62.5-25 MCG/INH IN AEPB: 1.0000 | INHALATION_SPRAY | Freq: Every day | RESPIRATORY_TRACT | Status: DC

## 2015-03-08 NOTE — Patient Instructions (Signed)

## 2015-03-08 NOTE — Progress Notes (Signed)
Pre visit review using our clinic review tool, if applicable. No additional management support is needed unless otherwise documented below in the visit note. 

## 2015-03-08 NOTE — Progress Notes (Signed)
Subjective:  Patient ID: Janice Morrow, female    DOB: [birth year]  Age: 69 y.o. MRN: [id number]  CC: COPD   HPI Janice Morrow presents for for concern about her right breast. She was found to have a mass in her right breast incidentally on a CAT scan that was done in the emergency room about 4 months ago. When I last saw her about 3 months ago she was referred for some diagnostic testing of the right breast. She has not gone because she was concerned it would be a painful procedure. She now complains that the right nipple is inverted and there has been some bleeding from the right nipple. She also complains of an occasional episode of shortness of breath with a mild nonproductive cough. She does not experience any chest pain, dyspnea on exertion, dizziness, or diaphoresis.  Outpatient Prescriptions Prior to Visit  Medication Sig Dispense Refill  . lamoTRIgine (LAMICTAL) 25 MG tablet Take 1 tablet (25 mg total) by mouth daily. 30 tablet 0  . HYDROcodone-acetaminophen (NORCO) 10-325 MG per tablet Take 1 tablet by mouth as needed.     . pantoprazole (PROTONIX) 40 MG tablet Take 1 tablet (40 mg total) by mouth daily. Switch for any other PPI at similar dose and frequency (Patient not taking: Reported on 01/04/2015) 15 tablet 0   No facility-administered medications prior to visit.    ROS Review of Systems  Constitutional: Negative.  Negative for fever, chills, diaphoresis, appetite change and fatigue.  HENT: Negative.   Eyes: Negative.   Respiratory: Positive for cough and wheezing. Negative for choking, shortness of breath and stridor.   Cardiovascular: Negative.  Negative for chest pain, palpitations and leg swelling.  Gastrointestinal: Negative.  Negative for nausea, vomiting, abdominal pain, diarrhea and constipation.  Endocrine: Negative.   Genitourinary: Negative.   Musculoskeletal: Negative.   Skin: Negative.  Negative for rash.  Allergic/Immunologic: Negative.   Neurological:  Negative.  Negative for dizziness, tremors, syncope, weakness, light-headedness, numbness and headaches.  Hematological: Negative.  Negative for adenopathy. Does not bruise/bleed easily.  Psychiatric/Behavioral: Negative.     Objective:  BP 130/90 mmHg  Pulse 85  Temp(Src) 98.7 F (37.1 C) (Oral)  Resp 16  Ht 5\' 5"  (1.651 m)  Wt 338 lb (153.316 kg)  BMI 56.25 kg/m2  SpO2 95%  BP Readings from Last 3 Encounters:  03/08/15 130/90  01/13/15 153/89  01/04/15 148/86    Wt Readings from Last 3 Encounters:  03/08/15 338 lb (153.316 kg)  01/13/15 331 lb (150.141 kg)  11/25/14 334 lb 4 oz (151.615 kg)    Physical Exam  Constitutional: She is oriented to person, place, and time. No distress.  HENT:  Mouth/Throat: Oropharynx is clear and moist. No oropharyngeal exudate.  Eyes: Conjunctivae are normal. Right eye exhibits no discharge. Left eye exhibits no discharge. No scleral icterus.  Neck: Normal range of motion. Neck supple. No JVD present. No tracheal deviation present. No thyromegaly present.  Cardiovascular: Normal rate, regular rhythm, normal heart sounds and intact distal pulses.  Exam reveals no gallop and no friction rub.   No murmur heard. Pulmonary/Chest: Effort normal and breath sounds normal. No accessory muscle usage or stridor. No respiratory distress. She has no decreased breath sounds. She has no wheezes. She has no rhonchi. She has no rales. She exhibits no mass, no tenderness, no bony tenderness, no crepitus, no edema, no deformity and no swelling. Right breast exhibits inverted nipple and mass. Right breast exhibits no nipple  discharge, no skin change and no tenderness. Left breast exhibits no inverted nipple, no mass, no nipple discharge, no skin change and no tenderness. Breasts are symmetrical.    Abdominal: Soft. Bowel sounds are normal. She exhibits no distension and no mass. There is no tenderness. There is no rebound and no guarding.  Musculoskeletal: Normal  range of motion. She exhibits no edema or tenderness.  Lymphadenopathy:    She has no cervical adenopathy.  Neurological: She is oriented to person, place, and time.  Skin: Skin is warm and dry. No rash noted. She is not diaphoretic. No erythema. No pallor.  Psychiatric: She has a normal mood and affect. Her behavior is normal. Judgment and thought content normal.  Vitals reviewed.   Lab Results  Component Value Date   WBC 7.5 11/20/2014   HGB 13.3 11/20/2014   HCT 42.2 11/20/2014   PLT 140* 11/20/2014   GLUCOSE 141* 11/20/2014   CHOL 182 10/28/2014   TRIG 109 10/28/2014   HDL 52 10/28/2014   LDLCALC 108* 10/28/2014   ALT 20 11/20/2014   AST 19 11/20/2014   NA 136 11/20/2014   K 4.2 11/20/2014   CL 100 11/20/2014   CREATININE 0.82 11/20/2014   BUN 15 11/20/2014   CO2 28 11/20/2014   TSH 4.212 10/28/2014   INR 1.12 08/22/2014   HGBA1C 6.1* 10/28/2014    Dg Chest 2 View  11/20/2014   CLINICAL DATA:  Weakness and shortness of breath  EXAM: CHEST  2 VIEW  COMPARISON:  10/28/2014, chest CT 10/19/2014  FINDINGS: Pulmonary arterial prominence with tapering is reidentified, which may be seen with pulmonary arterial hypertension. Mild cardiomegaly is reidentified. No new focal pulmonary opacity. No pleural effusion. No acute osseous finding.  IMPRESSION: Stable degree of central pulmonary arterial ectasia with rapid tapering which may be seen with pulmonary arterial hypertension. Venous congestion could appear similar.   Electronically Signed   By: Conchita Paris M.D.   On: 11/20/2014 13:55   Ct Head Wo Contrast  11/20/2014   CLINICAL DATA:  Lethargy and altered mental status for 1 day.  EXAM: CT HEAD WITHOUT CONTRAST  TECHNIQUE: Contiguous axial images were obtained from the base of the skull through the vertex without intravenous contrast.  COMPARISON:  August 22, 2014  FINDINGS: There is age related volume loss. There is no intracranial mass hemorrhage, extra-axial fluid collection,  or midline shift. The gray-white compartments appear normal. There is no acute appearing infarct. The bony calvarium appears intact. The mastoid air cells are clear.  IMPRESSION: Age related volume loss. No intracranial mass, hemorrhage, or focal gray -white compartment lesions/acute appearing infarct.   Electronically Signed   By: Lowella Grip III M.D.   On: 11/20/2014 14:30    Assessment & Plan:   Janice Morrow was seen today for copd.  Diagnoses and all orders for this visit:  Visit for screening mammogram Orders: -     MM DIGITAL SCREENING BILATERAL; Future  Mass of right breast - I do not see any evidence of infection today. I have encouraged her to proceed with an ultrasound and to consider biopsy with general surgery. Orders: -     US BREAST LTD UNI RIGHT INC AXILLA; Future -     Ambulatory referral to General Surgery  Simple chronic bronchitis - she will start Anoro for this. I gave her sample a sample and showed her how to use it. She demonstrated proficiency with its use. Orders: -     Umeclidinium-Vilanterol (  ANORO ELLIPTA) 62.5-25 MCG/INH AEPB; Inhale 1 puff into the lungs daily.  I have discontinued Ms. Lamontagne's pantoprazole. I am also having her start on Umeclidinium-Vilanterol. Additionally, I am having her maintain her lamoTRIgine and HYDROcodone-acetaminophen.  Meds ordered this encounter  Medications  . HYDROcodone-acetaminophen (NORCO) 10-325 MG per tablet    Sig: Take 1 tablet by mouth as needed.  [name] Umeclidinium-Vilanterol (ANORO ELLIPTA) 62.5-25 MCG/INH AEPB    Sig: Inhale 1 puff into the lungs daily.    Dispense:  30 each    Refill:  11     Follow-up: Return in about 3 months (around 06/08/2015).  [name], MD

## 2015-03-14 ENCOUNTER — Encounter: Payer: Self-pay | Admitting: Internal Medicine

## 2015-03-16 ENCOUNTER — Telehealth: Payer: Self-pay | Admitting: Internal Medicine

## 2015-03-16 NOTE — Telephone Encounter (Signed)
No call made to pt.

## 2015-03-16 NOTE — Telephone Encounter (Signed)
Patient said she got a call from [place], I didn't see anything in the chart, I dont't if you call her or not.please advise

## 2015-03-18 ENCOUNTER — Ambulatory Visit
Admission: RE | Admit: 2015-03-18 | Discharge: 2015-03-18 | Disposition: A | Discharge status: Home / Self Care | Payer: Medicare Other | Source: Ambulatory Visit | Attending: Internal Medicine | Admitting: Internal Medicine

## 2015-03-18 ENCOUNTER — Telehealth: Payer: Self-pay

## 2015-03-18 ENCOUNTER — Other Ambulatory Visit: Payer: Self-pay | Admitting: Internal Medicine

## 2015-03-18 DIAGNOSIS — N6310 Unspecified lump in the right breast, unspecified quadrant: Secondary | ICD-10-CM

## 2015-03-18 DIAGNOSIS — N6320 Unspecified lump in the left breast, unspecified quadrant: Secondary | ICD-10-CM

## 2015-03-18 DIAGNOSIS — N6452 Nipple discharge: Secondary | ICD-10-CM | POA: Diagnosis not present

## 2015-03-18 LAB — HM MAMMOGRAPHY: HM MAMMO: ABNORMAL

## 2015-03-18 NOTE — Telephone Encounter (Signed)
Diagnostic on both breast was done. Dr. Rosana Hoes saw a place on the left side that was of concern as well.   [facility] ([facility]) needed verbal order/okay to perform diagnostic on left side. Verbal given.

## 2015-03-20 NOTE — Addendum Note (Signed)
Addended by: Janith Lima on: 03/20/2015 01:34 PM   Modules accepted: Miquel Dunn

## 2015-03-22 DIAGNOSIS — G629 Polyneuropathy, unspecified: Secondary | ICD-10-CM | POA: Diagnosis not present

## 2015-03-22 DIAGNOSIS — M4697 Unspecified inflammatory spondylopathy, lumbosacral region: Secondary | ICD-10-CM | POA: Diagnosis not present

## 2015-03-22 DIAGNOSIS — Z79899 Other long term (current) drug therapy: Secondary | ICD-10-CM | POA: Diagnosis not present

## 2015-03-22 DIAGNOSIS — M1990 Unspecified osteoarthritis, unspecified site: Secondary | ICD-10-CM | POA: Diagnosis not present

## 2015-03-22 DIAGNOSIS — M545 Low back pain: Secondary | ICD-10-CM | POA: Diagnosis not present

## 2015-03-22 DIAGNOSIS — G894 Chronic pain syndrome: Secondary | ICD-10-CM | POA: Diagnosis not present

## 2015-03-23 ENCOUNTER — Encounter: Payer: Self-pay | Admitting: *Deleted

## 2015-03-25 DIAGNOSIS — N6452 Nipple discharge: Secondary | ICD-10-CM | POA: Diagnosis not present

## 2015-03-30 ENCOUNTER — Encounter: Payer: Self-pay | Admitting: *Deleted

## 2015-04-11 ENCOUNTER — Encounter: Payer: Self-pay | Admitting: Internal Medicine

## 2015-04-11 ENCOUNTER — Ambulatory Visit (INDEPENDENT_AMBULATORY_CARE_PROVIDER_SITE_OTHER): Payer: Medicare Other | Admitting: Internal Medicine

## 2015-04-11 VITALS — BP 150/92 | HR 71 | Temp 98.4°F | Resp 18 | Wt 330.0 lb

## 2015-04-11 DIAGNOSIS — G500 Trigeminal neuralgia: Secondary | ICD-10-CM | POA: Diagnosis not present

## 2015-04-11 DIAGNOSIS — I5189 Other ill-defined heart diseases: Secondary | ICD-10-CM | POA: Insufficient documentation

## 2015-04-11 DIAGNOSIS — R739 Hyperglycemia, unspecified: Secondary | ICD-10-CM | POA: Diagnosis not present

## 2015-04-11 DIAGNOSIS — R7303 Prediabetes: Secondary | ICD-10-CM | POA: Insufficient documentation

## 2015-04-11 DIAGNOSIS — D696 Thrombocytopenia, unspecified: Secondary | ICD-10-CM | POA: Diagnosis not present

## 2015-04-11 DIAGNOSIS — I519 Heart disease, unspecified: Secondary | ICD-10-CM

## 2015-04-11 DIAGNOSIS — E785 Hyperlipidemia, unspecified: Secondary | ICD-10-CM

## 2015-04-11 MED ORDER — CARBAMAZEPINE ER 100 MG PO TB12: 100.0000 mg | ORAL_TABLET | Freq: Two times a day (BID) | ORAL | Status: DC

## 2015-04-11 NOTE — Progress Notes (Signed)
Pre visit review using our clinic review tool, if applicable. No additional management support is needed unless otherwise documented below in the visit note. 

## 2015-04-11 NOTE — Progress Notes (Signed)
Subjective:  Patient ID: Janice Morrow, female    DOB: December 10, 1945  Age: 69 y.o. MRN: [id number]  CC: COPD   HPI Janice Morrow presents for follow-up. She reports that her breathing is much better since she started using a Anoro. She complains that lamotrigine causes a headache so she doesn't want to take it anymore. She still has intermittent episodes of right facial pain and wants to try a different medicine for this. She complains of persistent episodes of chest pain and shortness of breath and she says she wants to see her cardiologist. Her previous echocardiogram showed grade 1 diastolic dysfunction. The workup of the breast mass has been benign so far.  Outpatient Prescriptions Prior to Visit  Medication Sig Dispense Refill  . HYDROcodone-acetaminophen (NORCO) 10-325 MG per tablet Take 1 tablet by mouth as needed.    [name] Umeclidinium-Vilanterol (ANORO ELLIPTA) 62.5-25 MCG/INH AEPB Inhale 1 puff into the lungs daily. 30 each 11  . lamoTRIgine (LAMICTAL) 25 MG tablet Take 1 tablet (25 mg total) by mouth daily. (Patient not taking: Reported on 04/11/2015) 30 tablet 0   No facility-administered medications prior to visit.    ROS Review of Systems  Constitutional: Negative for fever, chills, diaphoresis, appetite change and fatigue.  HENT: Negative.  Negative for facial swelling, hearing loss, sinus pressure, sore throat and trouble swallowing.   Eyes: Negative.   Respiratory: Positive for shortness of breath. Negative for apnea, cough, choking, chest tightness, wheezing and stridor.   Cardiovascular: Positive for chest pain. Negative for palpitations and leg swelling.  Gastrointestinal: Negative.  Negative for nausea, vomiting, abdominal pain, diarrhea, constipation and blood in stool.  Endocrine: Negative.   Genitourinary: Negative.  Negative for difficulty urinating.  Musculoskeletal: Negative.   Skin: Negative.  Negative for rash.  Allergic/Immunologic: Negative.   Neurological:  Negative.  Negative for dizziness, seizures, syncope, facial asymmetry, speech difficulty, weakness and headaches.  Hematological: Negative.  Negative for adenopathy. Does not bruise/bleed easily.  Psychiatric/Behavioral: Negative.     Objective:  BP 150/92 mmHg  Pulse 71  Temp(Src) 98.4 F (36.9 C) (Oral)  Resp 18  Wt 330 lb (149.687 kg)  SpO2 92%  BP Readings from Last 3 Encounters:  04/11/15 150/92  03/08/15 130/90  01/13/15 153/89    Wt Readings from Last 3 Encounters:  04/11/15 330 lb (149.687 kg)  03/08/15 338 lb (153.316 kg)  01/13/15 331 lb (150.141 kg)    Physical Exam  Constitutional: She is oriented to person, place, and time. No distress.  HENT:  Mouth/Throat: Oropharynx is clear and moist. No oropharyngeal exudate.  Eyes: Conjunctivae are normal. Right eye exhibits no discharge. Left eye exhibits no discharge. No scleral icterus.  Neck: Normal range of motion. Neck supple. No JVD present. No tracheal deviation present. No thyromegaly present.  Cardiovascular: Normal rate, regular rhythm, normal heart sounds and intact distal pulses.  Exam reveals no gallop and no friction rub.   No murmur heard. Pulmonary/Chest: Effort normal and breath sounds normal. No stridor. No respiratory distress. She has no wheezes. She has no rales. She exhibits no tenderness.  Abdominal: Soft. Bowel sounds are normal. She exhibits no distension and no mass. There is no tenderness. There is no rebound and no guarding.  Musculoskeletal: Normal range of motion. She exhibits no edema or tenderness.  Lymphadenopathy:    She has no cervical adenopathy.  Neurological: She is oriented to person, place, and time.  Skin: Skin is warm and dry. No rash noted. She  is not diaphoretic. No erythema. No pallor.  Psychiatric: She has a normal mood and affect. Judgment and thought content normal.    Lab Results  Component Value Date   WBC 7.5 11/20/2014   HGB 13.3 11/20/2014   HCT 42.2 11/20/2014     PLT 140* 11/20/2014   GLUCOSE 141* 11/20/2014   CHOL 182 10/28/2014   TRIG 109 10/28/2014   HDL 52 10/28/2014   LDLCALC 108* 10/28/2014   ALT 20 11/20/2014   AST 19 11/20/2014   NA 136 11/20/2014   K 4.2 11/20/2014   CL 100 11/20/2014   CREATININE 0.82 11/20/2014   BUN 15 11/20/2014   CO2 28 11/20/2014   TSH 4.212 10/28/2014   INR 1.12 08/22/2014   HGBA1C 6.1* 10/28/2014    US Breast [place] Axilla  03/18/2015   CLINICAL DATA:  Patient presents for evaluation of right breast bloody nipple discharge. She states she has had approximately 3 episodes of discharge over the last 4 months.  EXAM: DIGITAL DIAGNOSTIC BILATERAL MAMMOGRAM WITH 3D TOMOSYNTHESIS WITH CAD  ULTRASOUND BILATERAL BREAST  COMPARISON:  Previous exam(s).  ACR Breast Density Category b: There are scattered areas of fibroglandular density.  FINDINGS: Within the medial left breast on the CC view tomosynthesis images there is questioned distortion which appears to resolve with additional spot compression CC tomosynthesis views. No additional concerning masses, calcifications or nonsurgical architectural distortion identified within either breast.  Mammographic images were processed with CAD.  On physical exam, I am not able to elicit discharge from the right nipple with palpation.  Targeted ultrasound is performed, showing no discrete subareolar right breast mass, intraductal mass or dilated ducts.  No suspicious abnormality identified within the lower inner or upper inner left breast to correspond with possible left breast distortion.  IMPRESSION: Patient reports episodes of prior bloody discharge from the right nipple. No discharge was able to be elicited on today's evaluation and there are is discrete subareolar mass is identified. Given that these episodes have been infrequent and she has not had any episodes of discharge for quite some time, this may be physiologic in etiology. Patient was instructed to return for  additional evaluation to include ductogram and/or possible breast MRI should the discharge resume.  Questioned distortion within the medial left breast appeared to resolve with additional spot compression views.  RECOMMENDATION: Left breast diagnostic mammography in 6 months to demonstrate stability of left breast parenchymal pattern. Questioned distortion within the left breast was felt to resolve however given limitations with imaging due to body habitus, short-term follow-up is recommended.  Return for ductogram and/or possible breast MRI should right breast discharge resume.  I have discussed the findings and recommendations with the patient. Results were also provided in writing at the conclusion of the visit. If applicable, a reminder letter will be sent to the patient regarding the next appointment.  BI-RADS CATEGORY  3: Probably benign.   Electronically Signed   By: Lovey Newcomer M.D.   On: 03/18/2015 20:14   US Breast [facility] Axilla  03/18/2015   CLINICAL DATA:  Patient presents for evaluation of right breast bloody nipple discharge. She states she has had approximately 3 episodes of discharge over the last 4 months.  EXAM: DIGITAL DIAGNOSTIC BILATERAL MAMMOGRAM WITH 3D TOMOSYNTHESIS WITH CAD  ULTRASOUND BILATERAL BREAST  COMPARISON:  Previous exam(s).  ACR Breast Density Category b: There are scattered areas of fibroglandular density.  FINDINGS: Within the medial left breast on  the CC view tomosynthesis images there is questioned distortion which appears to resolve with additional spot compression CC tomosynthesis views. No additional concerning masses, calcifications or nonsurgical architectural distortion identified within either breast.  Mammographic images were processed with CAD.  On physical exam, I am not able to elicit discharge from the right nipple with palpation.  Targeted ultrasound is performed, showing no discrete subareolar right breast mass, intraductal mass or dilated ducts.  No  suspicious abnormality identified within the lower inner or upper inner left breast to correspond with possible left breast distortion.  IMPRESSION: Patient reports episodes of prior bloody discharge from the right nipple. No discharge was able to be elicited on today's evaluation and there are is discrete subareolar mass is identified. Given that these episodes have been infrequent and she has not had any episodes of discharge for quite some time, this may be physiologic in etiology. Patient was instructed to return for additional evaluation to include ductogram and/or possible breast MRI should the discharge resume.  Questioned distortion within the medial left breast appeared to resolve with additional spot compression views.  RECOMMENDATION: Left breast diagnostic mammography in 6 months to demonstrate stability of left breast parenchymal pattern. Questioned distortion within the left breast was felt to resolve however given limitations with imaging due to body habitus, short-term follow-up is recommended.  Return for ductogram and/or possible breast MRI should right breast discharge resume.  I have discussed the findings and recommendations with the patient. Results were also provided in writing at the conclusion of the visit. If applicable, a reminder letter will be sent to the patient regarding the next appointment.  BI-RADS CATEGORY  3: Probably benign.   Electronically Signed   By: Lovey Newcomer M.D.   On: 03/18/2015 20:14   Mm Diag Breast Tomo Bilateral  03/18/2015   CLINICAL DATA:  Patient presents for evaluation of right breast bloody nipple discharge. She states she has had approximately 3 episodes of discharge over the last 4 months.  EXAM: DIGITAL DIAGNOSTIC BILATERAL MAMMOGRAM WITH 3D TOMOSYNTHESIS WITH CAD  ULTRASOUND BILATERAL BREAST  COMPARISON:  Previous exam(s).  ACR Breast Density Category b: There are scattered areas of fibroglandular density.  FINDINGS: Within the medial left breast on the  CC view tomosynthesis images there is questioned distortion which appears to resolve with additional spot compression CC tomosynthesis views. No additional concerning masses, calcifications or nonsurgical architectural distortion identified within either breast.  Mammographic images were processed with CAD.  On physical exam, I am not able to elicit discharge from the right nipple with palpation.  Targeted ultrasound is performed, showing no discrete subareolar right breast mass, intraductal mass or dilated ducts.  No suspicious abnormality identified within the lower inner or upper inner left breast to correspond with possible left breast distortion.  IMPRESSION: Patient reports episodes of prior bloody discharge from the right nipple. No discharge was able to be elicited on today's evaluation and there are is discrete subareolar mass is identified. Given that these episodes have been infrequent and she has not had any episodes of discharge for quite some time, this may be physiologic in etiology. Patient was instructed to return for additional evaluation to include ductogram and/or possible breast MRI should the discharge resume.  Questioned distortion within the medial left breast appeared to resolve with additional spot compression views.  RECOMMENDATION: Left breast diagnostic mammography in 6 months to demonstrate stability of left breast parenchymal pattern. Questioned distortion within the left breast was felt to resolve however  given limitations with imaging due to body habitus, short-term follow-up is recommended.  Return for ductogram and/or possible breast MRI should right breast discharge resume.  I have discussed the findings and recommendations with the patient. Results were also provided in writing at the conclusion of the visit. If applicable, a reminder letter will be sent to the patient regarding the next appointment.  BI-RADS CATEGORY  3: Probably benign.   Electronically Signed   By: Lovey Newcomer  M.D.   On: 03/18/2015 20:14    Assessment & Plan:   Janice Morrow was seen today for copd.  Diagnoses and all orders for this visit:  Trigeminal neuralgia of right side of face- due to the side effects will discontinue the lamotrigine and will start carbamazepine. I've also asked her to follow-up with neurology. -     Ambulatory referral to Neurology -     carbamazepine (TEGRETOL-XR) 100 MG 12 hr tablet; Take 1 tablet (100 mg total) by mouth 2 (two) times daily.  Left ventricular diastolic dysfunction, NYHA class 1- she is referred to cardiology at her request -     Ambulatory referral to Cardiology -     Basic metabolic panel; Future  Thrombocytopenia- she has no signs of bleeding or bruising, will recheck her platelet count and screen her for hepatitis C -     CBC with Differential/Platelet; Future -     Hepatitis C antibody; Future  Hyperglycemia- I will check an A1c to see if she has developed diabetes mellitus, in the meantime she will work on her lifestyle modifications. -     Basic metabolic panel; Future -     Hemoglobin A1c; Future  Hyperlipidemia with target LDL less than 130 -     TSH; Future   I have discontinued Janice Morrow's lamoTRIgine. I am also having her start on carbamazepine. Additionally, I am having her maintain her HYDROcodone-acetaminophen and Umeclidinium-Vilanterol.  Meds ordered this encounter  Medications  . carbamazepine (TEGRETOL-XR) 100 MG 12 hr tablet    Sig: Take 1 tablet (100 mg total) by mouth 2 (two) times daily.    Dispense:  30 tablet    Refill:  5     Follow-up: Return in about 3 months (around 07/12/2015).  [name], MD

## 2015-04-11 NOTE — Patient Instructions (Signed)
Trigeminal Neuralgia  Trigeminal neuralgia is a nerve disorder that causes sudden attacks of severe facial pain. It is caused by damage to the trigeminal nerve, a major nerve in the face. It is more common in women and in the elderly, although it can also happen in younger patients. Attacks last from a few seconds to several minutes and can occur from a couple of times per year to several times per day. Trigeminal neuralgia can be a very distressing and disabling condition. Surgery may be needed in very severe cases if medical treatment does not give relief.  HOME CARE INSTRUCTIONS    If your caregiver prescribed medication to help prevent attacks, take as directed.   To help prevent attacks:   Chew on the unaffected side of the mouth.   Avoid touching your face.   Avoid blasts of hot or cold air.   Men may wish to grow a beard to avoid having to shave.  SEEK IMMEDIATE MEDICAL CARE IF:   Pain is unbearable and your medicine does not help.   You develop new, unexplained symptoms (problems).   You have problems that may be related to a medication you are taking.  Document Released: 08/10/2000 Document Revised: 11/05/2011 Document Reviewed: 06/10/2009  Exit[employer] Patient Information 2015 [employer]. This information is not intended to replace advice given to you by your health care provider. Make sure you discuss any questions you have with your health care provider.

## 2015-04-12 ENCOUNTER — Ambulatory Visit: Payer: Medicare Other | Admitting: Neurology

## 2015-04-12 ENCOUNTER — Encounter: Payer: Self-pay | Admitting: Internal Medicine

## 2015-04-14 ENCOUNTER — Encounter: Payer: Self-pay | Admitting: Neurology

## 2015-04-14 ENCOUNTER — Ambulatory Visit (INDEPENDENT_AMBULATORY_CARE_PROVIDER_SITE_OTHER): Payer: Medicare Other | Admitting: Neurology

## 2015-04-14 VITALS — BP 133/69 | HR 68 | Ht 65.0 in | Wt 330.0 lb

## 2015-04-14 DIAGNOSIS — G44039 Episodic paroxysmal hemicrania, not intractable: Secondary | ICD-10-CM | POA: Diagnosis not present

## 2015-04-14 MED ORDER — INDOMETHACIN 25 MG PO CAPS: 25.0000 mg | ORAL_CAPSULE | Freq: Three times a day (TID) | ORAL | Status: DC

## 2015-04-14 NOTE — Patient Instructions (Addendum)
I had a long discussion with the patient with regards to her right hemicranial headaches which did not respond to Tegretol and she did not tolerate sphenocath injection. I had a recommend a trial of indomethacin 25 mg 3 times daily with food and if tolerated may increase it further. She was advised to call us in a couple of weeks and let us know her response. The patient also has a right occipital groove tenderness but she is refusing occipital nerve block injection at the present time. Greater than 50% of time during this 25 minute visit was spent on counseling and coordination of care. Return for follow-up in 3 months with Ward Givens, NP or call earlier if necessary.

## 2015-04-14 NOTE — Progress Notes (Signed)
PATIENT: Janice Morrow DOB: Apr 26, 1946  REASON FOR VISIT: routine follow up for trigeminal neuralgia HISTORY FROM: patient  HISTORY OF PRESENT ILLNESS: PRIOR HPI 11/11/12 (PS): 69 year old lady with chronic headaches likely transform migraines with and algesic rebound. She has failed trials of tramadol, trexomet,tylenol,, goody powder, Percocet, Topamax, Botox, Depakote and Zanaflex in the past. She has underlying sleep apnea and musculoskeletal neck and shoulder pain as contributing triggers for her headache.  She returns today for followup after last visit on 06/30/12 requesting urgent appointment because of worsening headaches. She states for the last couple of months headaches have gotten worse and have now become daily. She has been taking migraine Excedrin on a daily basis for couple of weeks without much relief and has now switched to taking Goody powders with similar results. She did obtain relief with Zanaflex which had prescribed in the past but her insurance company is refusing to cover it for headache. She has not been doing regular neck stretching exercises of participating in any stress relaxation activities.  UPDATE 05/04/13 (LL): Patient returns for follow up for headaches. She states that the Depakote given at last visit did not provide any relief. The pain is right-sided, is a sharp shooting pain that starts in the right temporal region that comes around the face to the cheek and mandibular area. She can feel pulsating pain in her temple and the pain is stabbing in quality. She has pain in the jaw which makes it difficult to eat. She also has intermittent numbness under and behind her right ear. She had returned to [place] for a while but states she has not taken any in 2 weeks. Denies nausea, photophobia, or phonophobia.  Update 06/17/13 (PS): She returns for followup after last visit on 05/04/13 with Jeani Hawking, nurse practitioner. She states improvement in her headaches after  starting gabapentin which she takes 300 mg 3 times daily. She has had these headaches only twice in October so far and twice in last month as well. She has discontinued Zanaflex and does not take it anymore. She did have lab work on 05/04/13 which included ESR, CBC and BMP which were normal. She states she's lost 15 pounds and seemed surprised and happy about it. She has no new complaints today.  Update 11/26/13 (LL): She returns for followup after last visit on 05/04/13 with Dr. Leonie Man. She states that the gabapentin is not helping her headaches anymore, but she continues to take it. She is still having about 4 or 5 days where she has headaches a week, episode lasting 30-45 minutes of stabbing pain. She also complains today of severe low back pain that is chronic in nature due to disc herniation in the lumbar spine area. She reports taking an average of 2 goody powders per day either for headache or for back pain.   Update 03/03/14 (LL): She returns for followup after last visit on 11/26/13 with me. Since last visit, she has been to a pain clinic for treatment of her lower back pain but she cannot tell me which one.  MRI lumbar spine was done which showed Moderate spinal stenosis at L3-4, Mild spinal stenosis L4-5, and Spondylosis and foraminal narrowing bilaterally L5-S1.  Her facial pain is still not under good control, and she has been taking 3 tablets of Carbamazepine on some days.   Update 01/04/2015 : She returns for follow-up after last visit in July 2015. She continues to have right hemicranial headaches which occur 2-3 times per  week and last for half an hour to a few hours. She was prescribed carbamazepine which did not work well and hence she stopped it. Recently she started lamotrigine 25 mg daily which is tolerating well. She was admitted in February 2016 for chest pain but had extensive negative workup. She was admitted without abnormal mental status in March 2016 which was felt to be related to baclofen  started in the pain clinic. She has failed trials of multiple medications for her headaches including Botox.[name] Update 04/14/2015 :  She returns for follow-up after last visit. Months ago. She continues to have headaches around 3 times per week. There is still right hemicranial moderate to severe intensity accompanied by occasional nausea and vomiting. She takes hydrocodone which helps. She was seen by Dr. Lavell Anchors who attempted sphenocath insertion but patient did not tolerate the procedure and could not get treated. Review of Systems  A 14 system review of systems is Positive for back pain, joint pain,,  walking difficulty, dizziness, headache, seizure, tremors and alll other systems negative   ALLERGIES: Allergies  Allergen Reactions  . Baclofen Other (See Comments)    Causes seizures    HOME MEDICATIONS: Outpatient Prescriptions Prior to Visit  Medication Sig Dispense Refill  . carbamazepine (TEGRETOL-XR) 100 MG 12 hr tablet Take 1 tablet (100 mg total) by mouth 2 (two) times daily. 30 tablet 5  . HYDROcodone-acetaminophen (NORCO) 10-325 MG per tablet Take 1 tablet by mouth as needed.    [name] Umeclidinium-Vilanterol (ANORO ELLIPTA) 62.5-25 MCG/INH AEPB Inhale 1 puff into the lungs daily. 30 each 11   No facility-administered medications prior to visit.    PHYSICAL EXAM Filed Vitals:   04/14/15 1601  BP: 133/69  Pulse: 68  Height: 5' 5" (1.651 m)  Weight: 330 lb (149.687 kg)   Body mass index is 54.91 kg/(m^2). No exam data present No flowsheet data found.  No flowsheet data found.   Physical Exam  [name] who appears not to be in distress. Head is non traumatic. Neck is supple. There is mild spasm of the posterior neck and upper scapular muscles which appear tight. Cardiac exam with systolic murmur. Lungs are clear to auscultation. Skin shows no rash. Hearing appears normal .tenderness right occipital groove  Neurologic Exam  Awake alert oriented  x3 with normal speech and language function. Intact attention, registration and recall.  Cranial nerve exam reveals full range of eye movements without nystagmus. Pupils are equal reactive. Face is symmetric without weakness. Palatal moments normal. Tongue is midline.  Motor system exam reveals symmetric upper and lower extremity strength without any drift or focal weakness. Tone is normal.  Deep tendon reflexes are 2 per symmetric except ankle jerks are depressed.  Sensory system exam reveals normal touch, pinprick sensation and position and vibration.  Gait is not tested today, she is in wheelchair.   ASSESSMENT: 69 year-old lady with chronic headaches likely transformed migraine headaches with analgesic rebound who has failed trials of multiple medications in the past. Change in headache charecter to right hemicranial suggests chronic paroxysmal hemicrania. PLAN: I had a long discussion with the patient with regards to her right hemicranial headaches which did not respond to Tegretol and she did not tolerate sphenocath injection. I had a recommend a trial of indomethacin 25 mg 3 times daily with food and if tolerated may increase it further. She was advised to call us in a couple of weeks and let us know her response. The patient  also has a right occipital groove tenderness but she is refusing occipital nerve block injection at the present time. Greater than 50% of time during this 25 minute visit was spent on counseling and coordination of care. Return for follow-up in 3 months with Ward Givens, NP or call earlier if necessary.  Antony Contras, MD                                 Return in about 3 months (around 07/15/2015).  Antony Contras, MD  04/14/2015, 8:47 PM [facility] [address] [place], [place] [address] [phone]  Note: This document was prepared with digital dictation and possible smart phrase technology. Any transcriptional errors that  result from this process are unintentional.

## 2015-04-25 ENCOUNTER — Encounter: Payer: Self-pay | Admitting: Internal Medicine

## 2015-04-26 DIAGNOSIS — M545 Low back pain: Secondary | ICD-10-CM | POA: Diagnosis not present

## 2015-04-26 DIAGNOSIS — M549 Dorsalgia, unspecified: Secondary | ICD-10-CM | POA: Diagnosis not present

## 2015-04-26 DIAGNOSIS — Z79899 Other long term (current) drug therapy: Secondary | ICD-10-CM | POA: Diagnosis not present

## 2015-04-26 DIAGNOSIS — G894 Chronic pain syndrome: Secondary | ICD-10-CM | POA: Diagnosis not present

## 2015-05-18 ENCOUNTER — Ambulatory Visit (INDEPENDENT_AMBULATORY_CARE_PROVIDER_SITE_OTHER): Payer: Medicare Other | Admitting: Cardiology

## 2015-05-18 ENCOUNTER — Encounter: Payer: Self-pay | Admitting: Cardiology

## 2015-05-18 VITALS — BP 120/86 | HR 83 | Ht 65.0 in | Wt 337.7 lb

## 2015-05-18 DIAGNOSIS — I200 Unstable angina: Secondary | ICD-10-CM | POA: Diagnosis not present

## 2015-05-18 MED ORDER — ISOSORBIDE MONONITRATE ER 30 MG PO TB24: 30.0000 mg | ORAL_TABLET | Freq: Every day | ORAL | Status: DC

## 2015-05-18 NOTE — Progress Notes (Signed)
Cardiology Office Note   Date:  05/18/2015   ID:  Janice Morrow, DOB 01/14/1946, MRN [id number]  PCP:  [name], MD  Cardiologist:  [facility]    Chief Complaint  Patient presents with  . New Evaluation    PCP referral for chest pain/shortness of breath, reports she has a spasm in her heart; reports she has a heart murmur and used to use NTG patches; complains of lightheadedness/dizziness; complains of swelling in lower extremities; used to have a cardiologist in [place], [place]      History of Present Illness: Janice Morrow is a 69 y.o. female who presents for chest pain.  Has had several episodes.  They wake her from sleep.  No  nausea or diaphoresis.m sleep.  Described as sharp shooting  Some heart racing.  Last seen in 10/2014 at [facility] with chest pain.  This pain is similar.  She does have FH premature CAD.   PMH of morbidy obesity, GERD, COPD, prior tobacco use (quit 5 years ago) who was hospitalized in December for community acquired pneumonia (with some chest pain on presentation) who presents today with chest pain. She tells me she's had similar chest pain for years. She had similar chest pain in December and thought it was related to her PNA then- nuc was done.  Nuc Study 10/2014 IMPRESSION: 1. Probable breast attenuation artifact. No definite reversible ischemia or infarction.  2. Normal left ventricular wall motion, with left ventricular dilatation noted.  3. Left ventricular ejection fraction 58%  4. Low-risk stress test findings*.  Echo 10/2014: Study Conclusions  - Left ventricle: The cavity size was mildly dilated. Wall thickness was increased in a pattern of mild LVH. Systolic function was normal. The estimated ejection fraction was in the range of 50% to 55%. Doppler parameters are consistent with abnormal left ventricular relaxation (grade 1 diastolic dysfunction). - Mitral valve: Calcified annulus. - Left atrium: The atrium was mildly  dilated. - Right atrium: The atrium was mildly dilated  Pt stated she was angry today - she was told she would see Dr. Stanford Breed -I offered her option of rescheduling but she preferred to be seen.  Our office manager did discuss with her.   Past Medical History  Diagnosis Date  . Heart murmur   . Headache(784.0)   . COPD (chronic obstructive pulmonary disease)   . GERD (gastroesophageal reflux disease)   . PVD (peripheral vascular disease)   . Hyperlipidemia   . Cervicalgia   . Migraine without aura, with intractable migraine, so stated, without mention of status migrainosus   . PVD (peripheral vascular disease)   . Osteoarthritis   . Chronic back pain   . Trigeminal neuralgia   . Shortness of breath dyspnea   . Seizures 07/2014, 10/2014    Past Surgical History  Procedure Laterality Date  . Dental surgery       Current Outpatient Prescriptions  Medication Sig Dispense Refill  . carbamazepine (TEGRETOL-XR) 100 MG 12 hr tablet Take 1 tablet (100 mg total) by mouth 2 (two) times daily. 30 tablet 5  . HYDROcodone-acetaminophen (NORCO) 10-325 MG per tablet Take 1 tablet by mouth as needed.    [name] Umeclidinium-Vilanterol (ANORO ELLIPTA) 62.5-25 MCG/INH AEPB Inhale 1 puff into the lungs daily. 30 each 11   No current facility-administered medications for this visit.    Allergies:   Baclofen    Social History:  The patient  reports that she quit smoking about 4 years ago. Her smoking use  included Cigarettes. She has never used smokeless tobacco. She reports that she does not drink alcohol or use illicit drugs.   Family History:  The patient's family history includes Dementia in her mother; Diabetes in her brother, mother, and another family member; Heart attack in her father; Heart disease in an other family member. Brother died of 37 of MI   ROS:  General:no colds or fevers, no weight changes Skin:no rashes or ulcers HEENT:no blurred vision, no congestion CV:see HPI PUL:see  HPI GI:no diarrhea constipation or melena, no indigestion GU:no hematuria, no dysuria MS:no joint pain, no claudication Neuro:no syncope, no lightheadedness Endo:no diabetes, no thyroid disease  Wt Readings from Last 3 Encounters:  05/18/15 337 lb 11.2 oz (153.18 kg)  04/14/15 330 lb (149.687 kg)  04/11/15 330 lb (149.687 kg)     PHYSICAL EXAM: VS:  BP 120/86 mmHg  Pulse 83  Ht 5\' 5"  (1.651 m)  Wt 337 lb 11.2 oz (153.18 kg)  BMI 56.20 kg/m2 , BMI Body mass index is 56.2 kg/(m^2). General:Pleasant affect, NAD Skin:Warm and dry, brisk capillary refill HEENT:normocephalic, sclera clear, mucus membranes moist Neck:supple, no JVD, no bruits  Heart:S1S2 RRR without murmur, gallup, rub or click Lungs:clear without rales, rhonchi, or wheezes ZMO:QHUT, non tender, + BS, do not palpate liver spleen or masses Ext:no lower ext edema, 2+ pedal pulses, 2+ radial pulses Neuro:alert and oriented, MAE, follows commands, + facial symmetry    EKG:  EKG is ordered today. The ekg ordered today demonstrates SR with PVC LAFB but no acute changes from previous EKG.   Recent Labs: 08/25/2014: Magnesium 2.0 10/28/2014: B Natriuretic Peptide 39.4; TSH 4.212 11/20/2014: ALT 20; BUN 15; Creatinine, Ser 0.82; Hemoglobin 13.3; Platelets 140*; Potassium 4.2; Sodium 136    Lipid Panel    Component Value Date/Time   CHOL 182 10/28/2014 2158   TRIG 109 10/28/2014 2158   HDL 52 10/28/2014 2158   CHOLHDL 3.5 10/28/2014 2158   VLDL 22 10/28/2014 2158   LDLCALC 108* 10/28/2014 2158       Other studies Reviewed: Additional studies/ records that were reviewed today include: hospital notes.   ASSESSMENT AND PLAN:  1. Unstable angina- discussed with Dr. Adora Fridge neg. nuc 10/2014, will repeat nuc lexiscan to review.  If positive would recommend cardiac cath.  I have added imdur to help prevent this pain that awakens from sleep.  She used to take sl NTG but does not have any.  + hx of premature CAD on her  father's side.  Follow up  With Dr. Stanford Breed. ? Pul arterial HTN on CXR.   2.  Obesity.    3. Neuralgia use CPAP for pain   Current medicines are reviewed with the patient today.  The patient Has no concerns regarding medicines.  The following changes have been made:  See above Labs/ tests ordered today include:see above  Disposition:   FU:  see above  Lennie Muckle, NP  05/18/2015 3:08 PM    [facility] [place], [place], [place] [address] [place], [place] Phone: [phone]; Fax: [phone]

## 2015-05-18 NOTE — Patient Instructions (Signed)
Cecilie Kicks, NP, has recommended making the following medication changes: START Isosorbide Mononitrate (Imdur) 30 mg - take 1 tablet by mouth daily. A new prescription has been sent to your pharmacy, on file, electronically.  Your physician has requested that you have a lexiscan myoview. For further information please visit [email or web address]. Please follow instruction sheet, as given.  Your physician has requested that you have a chest x-ray. A chest x-ray takes a picture of the organs and structures inside the chest, including the heart, lungs, and blood vessels. This test can show several things, including, whether the heart is enlarges; whether fluid is building up in the lungs; and whether pacemaker / defibrillator leads are still in place.  Cecilie Kicks, NP, recommends that you schedule a follow-up appointment after your stress test WITH DR CRENSHAW.

## 2015-05-24 DIAGNOSIS — M1990 Unspecified osteoarthritis, unspecified site: Secondary | ICD-10-CM | POA: Diagnosis not present

## 2015-05-24 DIAGNOSIS — M545 Low back pain: Secondary | ICD-10-CM | POA: Diagnosis not present

## 2015-05-24 DIAGNOSIS — G894 Chronic pain syndrome: Secondary | ICD-10-CM | POA: Diagnosis not present

## 2015-05-24 DIAGNOSIS — M25569 Pain in unspecified knee: Secondary | ICD-10-CM | POA: Diagnosis not present

## 2015-05-24 DIAGNOSIS — Z79899 Other long term (current) drug therapy: Secondary | ICD-10-CM | POA: Diagnosis not present

## 2015-05-27 ENCOUNTER — Telehealth (HOSPITAL_COMMUNITY): Payer: Self-pay

## 2015-05-27 NOTE — Telephone Encounter (Signed)
Encounter complete. 

## 2015-06-01 ENCOUNTER — Inpatient Hospital Stay (HOSPITAL_COMMUNITY): Admission: RE | Admit: 2015-06-01 | Payer: Medicare Other | Source: Ambulatory Visit

## 2015-06-02 ENCOUNTER — Inpatient Hospital Stay (HOSPITAL_COMMUNITY): Admission: RE | Admit: 2015-06-02 | Payer: Medicare Other | Source: Ambulatory Visit

## 2015-06-08 ENCOUNTER — Telehealth (HOSPITAL_COMMUNITY): Payer: Self-pay | Admitting: *Deleted

## 2015-06-15 ENCOUNTER — Telehealth (HOSPITAL_COMMUNITY): Payer: Self-pay | Admitting: *Deleted

## 2015-06-15 NOTE — Telephone Encounter (Signed)
Left message on voicemail in reference to upcoming appointment scheduled for 06/16/15. Phone number given for a call back so details instructions can be given. Nahuel Wilbert J Evans Levee, RN  

## 2015-06-16 ENCOUNTER — Ambulatory Visit (HOSPITAL_COMMUNITY): Payer: Medicare Other | Attending: Cardiology

## 2015-06-16 VITALS — Ht 65.0 in | Wt 337.0 lb

## 2015-06-16 DIAGNOSIS — R9439 Abnormal result of other cardiovascular function study: Secondary | ICD-10-CM | POA: Insufficient documentation

## 2015-06-16 DIAGNOSIS — G4440 Drug-induced headache, not elsewhere classified, not intractable: Secondary | ICD-10-CM

## 2015-06-16 DIAGNOSIS — I200 Unstable angina: Secondary | ICD-10-CM | POA: Insufficient documentation

## 2015-06-16 DIAGNOSIS — R0602 Shortness of breath: Secondary | ICD-10-CM

## 2015-06-16 MED ORDER — AMINOPHYLLINE 25 MG/ML IV SOLN: 75.0000 mg | Freq: Once | INTRAVENOUS | Status: DC

## 2015-06-16 MED ORDER — REGADENOSON 0.4 MG/5ML IV SOLN
0.4000 mg | Freq: Once | INTRAVENOUS | Status: AC
  Administered 2015-06-16: 0.4 mg via INTRAVENOUS

## 2015-06-16 MED ORDER — TECHNETIUM TC 99M SESTAMIBI GENERIC - CARDIOLITE
33.0000 | Freq: Once | INTRAVENOUS | Status: AC | PRN
  Administered 2015-06-16: 33 via INTRAVENOUS

## 2015-06-20 ENCOUNTER — Ambulatory Visit (HOSPITAL_COMMUNITY): Payer: Medicare Other | Attending: Cardiovascular Disease

## 2015-06-20 LAB — MYOCARDIAL PERFUSION IMAGING
CHL CUP NUCLEAR SRS: 11
CHL CUP NUCLEAR SSS: 5
LV dias vol: 145 mL
LV sys vol: 51 mL
NUC STRESS TID: 1.15
Peak HR: 99 {beats}/min
RATE: 0.27
Rest HR: 84 {beats}/min
SDS: 2

## 2015-06-20 MED ORDER — TECHNETIUM TC 99M SESTAMIBI GENERIC - CARDIOLITE
30.7000 | Freq: Once | INTRAVENOUS | Status: AC | PRN
  Administered 2015-06-20: 30.7 via INTRAVENOUS

## 2015-06-21 DIAGNOSIS — M47817 Spondylosis without myelopathy or radiculopathy, lumbosacral region: Secondary | ICD-10-CM | POA: Diagnosis not present

## 2015-06-21 DIAGNOSIS — G894 Chronic pain syndrome: Secondary | ICD-10-CM | POA: Diagnosis not present

## 2015-06-21 DIAGNOSIS — M79606 Pain in leg, unspecified: Secondary | ICD-10-CM | POA: Diagnosis not present

## 2015-06-21 DIAGNOSIS — Z79899 Other long term (current) drug therapy: Secondary | ICD-10-CM | POA: Diagnosis not present

## 2015-06-21 DIAGNOSIS — M1288 Other specific arthropathies, not elsewhere classified, other specified site: Secondary | ICD-10-CM | POA: Diagnosis not present

## 2015-06-21 DIAGNOSIS — M5137 Other intervertebral disc degeneration, lumbosacral region: Secondary | ICD-10-CM | POA: Diagnosis not present

## 2015-07-01 ENCOUNTER — Encounter: Payer: Self-pay | Admitting: *Deleted

## 2015-07-12 ENCOUNTER — Encounter: Payer: Self-pay | Admitting: Internal Medicine

## 2015-07-12 ENCOUNTER — Ambulatory Visit (INDEPENDENT_AMBULATORY_CARE_PROVIDER_SITE_OTHER): Payer: Medicare Other | Admitting: Internal Medicine

## 2015-07-12 VITALS — BP 142/88 | HR 85 | Temp 98.1°F | Resp 16 | Ht 65.0 in | Wt 337.0 lb

## 2015-07-12 DIAGNOSIS — G4733 Obstructive sleep apnea (adult) (pediatric): Secondary | ICD-10-CM

## 2015-07-12 DIAGNOSIS — G43001 Migraine without aura, not intractable, with status migrainosus: Secondary | ICD-10-CM

## 2015-07-12 DIAGNOSIS — I200 Unstable angina: Secondary | ICD-10-CM | POA: Diagnosis not present

## 2015-07-12 DIAGNOSIS — Z9989 Dependence on other enabling machines and devices: Secondary | ICD-10-CM

## 2015-07-12 MED ORDER — RIZATRIPTAN BENZOATE 10 MG PO TABS
10.0000 mg | ORAL_TABLET | ORAL | Status: DC | PRN
Start: 2015-07-12 — End: 2016-07-31

## 2015-07-12 MED ORDER — PROMETHAZINE HCL 12.5 MG PO TABS: 12.5000 mg | ORAL_TABLET | Freq: Four times a day (QID) | ORAL | Status: DC | PRN

## 2015-07-12 MED ORDER — PROMETHAZINE HCL 25 MG/ML IJ SOLN
25.0000 mg | Freq: Once | INTRAMUSCULAR | Status: AC
  Administered 2015-07-12: 25 mg via INTRAMUSCULAR

## 2015-07-12 NOTE — Progress Notes (Signed)
Pre visit review using our clinic review tool, if applicable. No additional management support is needed unless otherwise documented below in the visit note. 

## 2015-07-12 NOTE — Progress Notes (Signed)
Subjective:  Patient ID: Janice Morrow, female    DOB: [birth year]  Age: 69 y.o. MRN: [id number]  CC: Headache   HPI ISOBEL RODIS presents for a recurrent headache. She has had these headaches several times the last few years. She describes it as a pain on the right side of her posterior and parietal scalp. It causes nausea but no vomiting. She has previously had this evaluated and says it is a combination of cluster and migraine. She has mild dizziness but no other neurological complaints. She also wants a referral to have her CPAP evaluated.  Outpatient Prescriptions Prior to Visit  Medication Sig Dispense Refill  . carbamazepine (TEGRETOL-XR) 100 MG 12 hr tablet Take 1 tablet (100 mg total) by mouth 2 (two) times daily. 30 tablet 5  . HYDROcodone-acetaminophen (NORCO) 10-325 MG per tablet Take 1 tablet by mouth as needed.    . isosorbide mononitrate (IMDUR) 30 MG 24 hr tablet Take 1 tablet (30 mg total) by mouth daily. 30 tablet 11  . Umeclidinium-Vilanterol (ANORO ELLIPTA) 62.5-25 MCG/INH AEPB Inhale 1 puff into the lungs daily. 30 each 11   Facility-Administered Medications Prior to Visit  Medication Dose Route Frequency Provider Last Rate Last Dose  . aminophylline injection 75 mg  75 mg Intravenous Once Dorothy Spark, MD        ROS Review of Systems  Constitutional: Negative.  Negative for fever, chills, diaphoresis, appetite change and fatigue.  HENT: Negative for trouble swallowing and voice change.   Eyes: Negative.  Negative for photophobia and visual disturbance.  Respiratory: Positive for apnea.   Cardiovascular: Negative.  Negative for chest pain, palpitations and leg swelling.  Gastrointestinal: Positive for nausea. Negative for vomiting, abdominal pain, diarrhea, constipation and blood in stool.  Endocrine: Negative.   Genitourinary: Negative.   Musculoskeletal: Negative.  Negative for myalgias, back pain, joint swelling, arthralgias and neck stiffness.  Skin:  Negative.   Allergic/Immunologic: Negative.   Neurological: Positive for dizziness and headaches. Negative for tremors, seizures, syncope, facial asymmetry, speech difficulty, weakness, light-headedness and numbness.  Hematological: Negative.  Negative for adenopathy. Does not bruise/bleed easily.  Psychiatric/Behavioral: Negative.     Objective:  BP 142/88 mmHg  Pulse 85  Temp(Src) 98.1 F (36.7 C) (Oral)  Resp 16  Ht 5\' 5"  (1.651 m)  Wt 337 lb (152.862 kg)  BMI 56.08 kg/m2  SpO2 91%  BP Readings from Last 3 Encounters:  07/12/15 142/88  05/18/15 120/86  04/14/15 133/69    Wt Readings from Last 3 Encounters:  07/12/15 337 lb (152.862 kg)  06/16/15 337 lb (152.862 kg)  05/18/15 337 lb 11.2 oz (153.18 kg)    Physical Exam  Constitutional: She is oriented to person, place, and time. She appears well-developed and well-nourished. No distress.  HENT:  Head: Normocephalic and atraumatic.  Nose: Nose normal.  Mouth/Throat: Oropharynx is clear and moist. No oropharyngeal exudate.  Eyes: Conjunctivae and EOM are normal. Pupils are equal, round, and reactive to light. Right eye exhibits no discharge. Left eye exhibits no discharge. No scleral icterus.  Neck: Normal range of motion. Neck supple. No JVD present. No tracheal deviation present. No thyromegaly present.  Cardiovascular: Normal rate, regular rhythm, normal heart sounds and intact distal pulses.  Exam reveals no gallop and no friction rub.   No murmur heard. Pulmonary/Chest: Effort normal and breath sounds normal. No stridor. No respiratory distress. She has no wheezes. She has no rales. She exhibits no tenderness.  Abdominal: Soft. Bowel  sounds are normal. She exhibits no distension and no mass. There is no tenderness. There is no rebound and no guarding.  Musculoskeletal: Normal range of motion. She exhibits no edema or tenderness.  Lymphadenopathy:    She has no cervical adenopathy.  Neurological: She is alert and  oriented to person, place, and time. She has normal reflexes. She displays no atrophy and normal reflexes. No cranial nerve deficit. She exhibits normal muscle tone. She displays a negative Romberg sign. Coordination and gait normal. She displays no Babinski's sign on the right side. She displays no Babinski's sign on the left side.  Skin: Skin is warm and dry. No rash noted. She is not diaphoretic. No erythema. No pallor.  Psychiatric: She has a normal mood and affect. Her behavior is normal. Judgment and thought content normal.  Vitals reviewed.   Lab Results  Component Value Date   WBC 7.5 11/20/2014   HGB 13.3 11/20/2014   HCT 42.2 11/20/2014   PLT 140* 11/20/2014   GLUCOSE 141* 11/20/2014   CHOL 182 10/28/2014   TRIG 109 10/28/2014   HDL 52 10/28/2014   LDLCALC 108* 10/28/2014   ALT 20 11/20/2014   AST 19 11/20/2014   NA 136 11/20/2014   K 4.2 11/20/2014   CL 100 11/20/2014   CREATININE 0.82 11/20/2014   BUN 15 11/20/2014   CO2 28 11/20/2014   TSH 4.212 10/28/2014   INR 1.12 08/22/2014   HGBA1C 6.1* 10/28/2014    US Breast [place]  03/18/2015  CLINICAL DATA:  Patient presents for evaluation of right breast bloody nipple discharge. She states she has had approximately 3 episodes of discharge over the last 4 months. EXAM: DIGITAL DIAGNOSTIC BILATERAL MAMMOGRAM WITH 3D TOMOSYNTHESIS WITH CAD ULTRASOUND BILATERAL BREAST COMPARISON:  Previous exam(s). ACR Breast Density Category b: There are scattered areas of fibroglandular density. FINDINGS: Within the medial left breast on the CC view tomosynthesis images there is questioned distortion which appears to resolve with additional spot compression CC tomosynthesis views. No additional concerning masses, calcifications or nonsurgical architectural distortion identified within either breast. Mammographic images were processed with CAD. On physical exam, I am not able to elicit discharge from the right nipple with palpation.  Targeted ultrasound is performed, showing no discrete subareolar right breast mass, intraductal mass or dilated ducts. No suspicious abnormality identified within the lower inner or upper inner left breast to correspond with possible left breast distortion. IMPRESSION: Patient reports episodes of prior bloody discharge from the right nipple. No discharge was able to be elicited on today's evaluation and there are is discrete subareolar mass is identified. Given that these episodes have been infrequent and she has not had any episodes of discharge for quite some time, this may be physiologic in etiology. Patient was instructed to return for additional evaluation to include ductogram and/or possible breast MRI should the discharge resume. Questioned distortion within the medial left breast appeared to resolve with additional spot compression views. RECOMMENDATION: Left breast diagnostic mammography in 6 months to demonstrate stability of left breast parenchymal pattern. Questioned distortion within the left breast was felt to resolve however given limitations with imaging due to body habitus, short-term follow-up is recommended. Return for ductogram and/or possible breast MRI should right breast discharge resume. I have discussed the findings and recommendations with the patient. Results were also provided in writing at the conclusion of the visit. If applicable, a reminder letter will be sent to the patient regarding the next appointment. BI-RADS  CATEGORY  3: Probably benign. Electronically Signed   By: Lovey Newcomer M.D.   On: 03/18/2015 20:14   US Breast [employer] [facility] [employer] Axilla  03/18/2015  CLINICAL DATA:  Patient presents for evaluation of right breast bloody nipple discharge. She states she has had approximately 3 episodes of discharge over the last 4 months. EXAM: DIGITAL DIAGNOSTIC BILATERAL MAMMOGRAM WITH 3D TOMOSYNTHESIS WITH CAD ULTRASOUND BILATERAL BREAST COMPARISON:  Previous exam(s). ACR Breast Density  Category b: There are scattered areas of fibroglandular density. FINDINGS: Within the medial left breast on the CC view tomosynthesis images there is questioned distortion which appears to resolve with additional spot compression CC tomosynthesis views. No additional concerning masses, calcifications or nonsurgical architectural distortion identified within either breast. Mammographic images were processed with CAD. On physical exam, I am not able to elicit discharge from the right nipple with palpation. Targeted ultrasound is performed, showing no discrete subareolar right breast mass, intraductal mass or dilated ducts. No suspicious abnormality identified within the lower inner or upper inner left breast to correspond with possible left breast distortion. IMPRESSION: Patient reports episodes of prior bloody discharge from the right nipple. No discharge was able to be elicited on today's evaluation and there are is discrete subareolar mass is identified. Given that these episodes have been infrequent and she has not had any episodes of discharge for quite some time, this may be physiologic in etiology. Patient was instructed to return for additional evaluation to include ductogram and/or possible breast MRI should the discharge resume. Questioned distortion within the medial left breast appeared to resolve with additional spot compression views. RECOMMENDATION: Left breast diagnostic mammography in 6 months to demonstrate stability of left breast parenchymal pattern. Questioned distortion within the left breast was felt to resolve however given limitations with imaging due to body habitus, short-term follow-up is recommended. Return for ductogram and/or possible breast MRI should right breast discharge resume. I have discussed the findings and recommendations with the patient. Results were also provided in writing at the conclusion of the visit. If applicable, a reminder letter will be sent to the patient regarding  the next appointment. BI-RADS CATEGORY  3: Probably benign. Electronically Signed   By: Lovey Newcomer M.D.   On: 03/18/2015 20:14   Mm Diag Breast Tomo Bilateral  03/18/2015  CLINICAL DATA:  Patient presents for evaluation of right breast bloody nipple discharge. She states she has had approximately 3 episodes of discharge over the last 4 months. EXAM: DIGITAL DIAGNOSTIC BILATERAL MAMMOGRAM WITH 3D TOMOSYNTHESIS WITH CAD ULTRASOUND BILATERAL BREAST COMPARISON:  Previous exam(s). ACR Breast Density Category b: There are scattered areas of fibroglandular density. FINDINGS: Within the medial left breast on the CC view tomosynthesis images there is questioned distortion which appears to resolve with additional spot compression CC tomosynthesis views. No additional concerning masses, calcifications or nonsurgical architectural distortion identified within either breast. Mammographic images were processed with CAD. On physical exam, I am not able to elicit discharge from the right nipple with palpation. Targeted ultrasound is performed, showing no discrete subareolar right breast mass, intraductal mass or dilated ducts. No suspicious abnormality identified within the lower inner or upper inner left breast to correspond with possible left breast distortion. IMPRESSION: Patient reports episodes of prior bloody discharge from the right nipple. No discharge was able to be elicited on today's evaluation and there are is discrete subareolar mass is identified. Given that these episodes have been infrequent and she has not had any episodes of discharge for  quite some time, this may be physiologic in etiology. Patient was instructed to return for additional evaluation to include ductogram and/or possible breast MRI should the discharge resume. Questioned distortion within the medial left breast appeared to resolve with additional spot compression views. RECOMMENDATION: Left breast diagnostic mammography in 6 months to  demonstrate stability of left breast parenchymal pattern. Questioned distortion within the left breast was felt to resolve however given limitations with imaging due to body habitus, short-term follow-up is recommended. Return for ductogram and/or possible breast MRI should right breast discharge resume. I have discussed the findings and recommendations with the patient. Results were also provided in writing at the conclusion of the visit. If applicable, a reminder letter will be sent to the patient regarding the next appointment. BI-RADS CATEGORY  3: Probably benign. Electronically Signed   By: Lovey Newcomer M.D.   On: 03/18/2015 20:14    Assessment & Plan:   Ilianna was seen today for headache.  Diagnoses and all orders for this visit:  Migraine without aura and with status migrainosus, not intractable- she was given Phenergan IM in the office with improvement. Will treat the headaches with Maxalt and she will use Phenergan as needed for nausea. -     promethazine (PHENERGAN) 12.5 MG tablet; Take 1 tablet (12.5 mg total) by mouth every 6 (six) hours as needed for nausea or vomiting. -     rizatriptan (MAXALT) 10 MG tablet; Take 1 tablet (10 mg total) by mouth as needed for migraine. May repeat in 2 hours if needed -     promethazine (PHENERGAN) injection 25 mg; Inject 1 mL (25 mg total) into the muscle once.  OSA on CPAP -     Ambulatory referral to Pulmonology  I am having Ms. Bartel start on promethazine and rizatriptan. I am also having her maintain her HYDROcodone-acetaminophen, Umeclidinium-Vilanterol, carbamazepine, isosorbide mononitrate, and gabapentin. We administered promethazine.  Meds ordered this encounter  Medications  . gabapentin (NEURONTIN) 300 MG capsule    Sig: Take 300 mg by mouth 2 (two) times daily.    Refill:  2  . promethazine (PHENERGAN) 12.5 MG tablet    Sig: Take 1 tablet (12.5 mg total) by mouth every 6 (six) hours as needed for nausea or vomiting.    Dispense:  30  tablet    Refill:  0  . rizatriptan (MAXALT) 10 MG tablet    Sig: Take 1 tablet (10 mg total) by mouth as needed for migraine. May repeat in 2 hours if needed    Dispense:  10 tablet    Refill:  3  . promethazine (PHENERGAN) injection 25 mg    Sig:      Follow-up: Return in about 3 weeks (around 08/02/2015).  [name], MD

## 2015-07-12 NOTE — Patient Instructions (Signed)

## 2015-07-18 ENCOUNTER — Ambulatory Visit (INDEPENDENT_AMBULATORY_CARE_PROVIDER_SITE_OTHER): Payer: Medicare Other | Admitting: Adult Health

## 2015-07-18 ENCOUNTER — Encounter: Payer: Self-pay | Admitting: Adult Health

## 2015-07-18 VITALS — BP 149/83 | HR 93 | Ht 65.0 in

## 2015-07-18 DIAGNOSIS — G43019 Migraine without aura, intractable, without status migrainosus: Secondary | ICD-10-CM

## 2015-07-18 DIAGNOSIS — I200 Unstable angina: Secondary | ICD-10-CM | POA: Diagnosis not present

## 2015-07-18 MED ORDER — TIZANIDINE HCL 2 MG PO TABS: 2.0000 mg | ORAL_TABLET | Freq: Every day | ORAL | Status: DC

## 2015-07-18 NOTE — Progress Notes (Signed)
PATIENT: Janice Morrow DOB: [birth year]  REASON FOR VISIT: follow up- migraine headaches HISTORY FROM: patient  HISTORY OF PRESENT ILLNESS: Janice Morrow is a 69 year old female with a history of migraine headaches. She returns today for follow-up. The patient has been on a multitude of medications without benefit. She also tried the spheno cath procedure but was unable to tolerate it. At the last visit she was given a prescription of indomethacin 25 mg 3 times a day. She reports that she cannot tolerate this medication. She states that she continues to get headaches. They are normally located on the right side of the head. Sometimes she'll have a sharp pain in the cheek area. She does confirm nausea and vomiting with her headaches. She also has photophobia and phonophobia. She is currently on carbamazepine and gabapentin without any benefit. The patient has been referred to a headache clinic before in the past. Her primary care prescribed her Imitrex and Phenergan but she could not afford this medication. She returns today for an evaluation.  PRIOR HPI 11/11/12 (PS): 69 year old lady with chronic headaches likely transform migraines with and algesic rebound. She has failed trials of tramadol, trexomet,tylenol,, goody powder, Percocet, Topamax, Botox, Depakote and Zanaflex in the past. She has underlying sleep apnea and musculoskeletal neck and shoulder pain as contributing triggers for her headache.  She returns today for followup after last visit on 06/30/12 requesting urgent appointment because of worsening headaches. She states for the last couple of months headaches have gotten worse and have now become daily. She has been taking migraine Excedrin on a daily basis for couple of weeks without much relief and has now switched to taking Goody powders with similar results. She did obtain relief with Zanaflex which had prescribed in the past but her insurance company is refusing to cover it for headache.  She has not been doing regular neck stretching exercises of participating in any stress relaxation activities.  Update 04/14/2015 :  She returns for follow-up after last visit. Months ago. She continues to have headaches around 3 times per week. There is still right hemicranial moderate to severe intensity accompanied by occasional nausea and vomiting. She takes hydrocodone which helps. She was seen by Dr. Lavell Anchors who attempted sphenocath insertion but patient did not tolerate the procedure and could not get treated.  REVIEW OF SYSTEMS: Out of a complete 14 system review of symptoms, the patient complains only of the following symptoms, and all other reviewed systems are negative.  Wheezing, shortness of breath, chest pain, murmur, dizziness, headache, seizure  ALLERGIES: Allergies  Allergen Reactions  . Baclofen Other (See Comments)    Causes seizures    HOME MEDICATIONS: Outpatient Prescriptions Prior to Visit  Medication Sig Dispense Refill  . carbamazepine (TEGRETOL-XR) 100 MG 12 hr tablet Take 1 tablet (100 mg total) by mouth 2 (two) times daily. 30 tablet 5  . HYDROcodone-acetaminophen (NORCO) 10-325 MG per tablet Take 1 tablet by mouth as needed.    [name] Umeclidinium-Vilanterol (ANORO ELLIPTA) 62.5-25 MCG/INH AEPB Inhale 1 puff into the lungs daily. 30 each 11  . promethazine (PHENERGAN) 12.5 MG tablet Take 1 tablet (12.5 mg total) by mouth every 6 (six) hours as needed for nausea or vomiting. (Patient not taking: Reported on 07/18/2015) 30 tablet 0  . rizatriptan (MAXALT) 10 MG tablet Take 1 tablet (10 mg total) by mouth as needed for migraine. May repeat in 2 hours if needed (Patient not taking: Reported on 07/18/2015) 10 tablet 3  .  gabapentin (NEURONTIN) 300 MG capsule Take 300 mg by mouth 2 (two) times daily.  2  . isosorbide mononitrate (IMDUR) 30 MG 24 hr tablet Take 1 tablet (30 mg total) by mouth daily. 30 tablet 11   Facility-Administered Medications Prior to Visit    Medication Dose Route Frequency Provider Last Rate Last Dose  . aminophylline injection 75 mg  75 mg Intravenous Once Dorothy Spark, MD        PAST MEDICAL HISTORY: Past Medical History  Diagnosis Date  . Heart murmur   . Headache(784.0)   . COPD (chronic obstructive pulmonary disease) ([place])   . GERD (gastroesophageal reflux disease)   . PVD (peripheral vascular disease) ([place])   . Hyperlipidemia   . Cervicalgia   . Migraine without aura, with intractable migraine, so stated, without mention of status migrainosus   . PVD (peripheral vascular disease) ([place])   . Osteoarthritis   . Chronic back pain   . Trigeminal neuralgia   . Shortness of breath dyspnea   . Seizures ([place]) 07/2014, 10/2014    PAST SURGICAL HISTORY: Past Surgical History  Procedure Laterality Date  . Dental surgery      FAMILY HISTORY: Family History  Problem Relation Age of Onset  . Heart attack Father   . Heart disease    . Diabetes    . Diabetes Mother   . Dementia Mother   . Diabetes Brother     SOCIAL HISTORY: Social History   Social History  . Marital Status: Single    Spouse Name: N/A  . Number of Children: 1  . Years of Education: 12th   Occupational History  . DISABLED    Social History Main Topics  . Smoking status: Former Smoker    Types: Cigarettes    Quit date: 08/22/2010  . Smokeless tobacco: Never Used  . Alcohol Use: No  . Drug Use: No  . Sexual Activity: No   Other Topics Concern  . Not on file   Social History Narrative   Patient lives at home with her brother.    Disabled.   Education high school.   Right handed.   Caffeine Use: 5-6 20oz bottle sodas daily      PHYSICAL EXAM  Filed Vitals:   07/18/15 1454  BP: 149/83  Pulse: 93  Height: 5\' 5"  (1.651 m)   There is no weight on file to calculate BMI.  Generalized: Well developed, in no acute distress, obese  Neurological examination  Mentation: Alert oriented to time, place, history taking.  Follows all commands speech and language fluent Cranial nerve II-XII: Pupils were equal round reactive to light. Extraocular movements were full, visual field were full on confrontational test. Facial sensation and strength were normal. Uvula tongue midline. Head turning and shoulder shrug  were normal and symmetric. Motor: The motor testing reveals 5 over 5 strength of all 4 extremities. Good symmetric motor tone is noted throughout.  Sensory: Sensory testing is intact to soft touch on all 4 extremities. No evidence of extinction is noted.  Coordination: Cerebellar testing reveals good finger-nose-finger and heel-to-shin bilaterally.  Gait and station: patient in a wheelchair Reflexes: Deep tendon reflexes are symmetric and normal bilaterally.   DIAGNOSTIC DATA (LABS, IMAGING, TESTING) - I reviewed patient records, labs, notes, testing and imaging myself where available.  Lab Results  Component Value Date   WBC 7.5 11/20/2014   HGB 13.3 11/20/2014   HCT 42.2 11/20/2014   MCV 91.9 11/20/2014   PLT 140*  11/20/2014      Component Value Date/Time   NA 136 11/20/2014 1411   NA 139 05/04/2013 1602   K 4.2 11/20/2014 1411   CL 100 11/20/2014 1411   CO2 28 11/20/2014 1411   GLUCOSE 141* 11/20/2014 1411   GLUCOSE 102* 05/04/2013 1602   BUN 15 11/20/2014 1411   BUN 16 05/04/2013 1602   CREATININE 0.82 11/20/2014 1411   CALCIUM 8.8 11/20/2014 1411   PROT 7.5 11/20/2014 1411   PROT 6.8 05/04/2013 1602   ALBUMIN 4.1 11/20/2014 1411   ALBUMIN 4.0 05/04/2013 1602   AST 19 11/20/2014 1411   ALT 20 11/20/2014 1411   ALKPHOS 60 11/20/2014 1411   BILITOT 0.5 11/20/2014 1411   GFRNONAA 73* 11/20/2014 1411   GFRAA 85* 11/20/2014 1411   Lab Results  Component Value Date   CHOL 182 10/28/2014   HDL 52 10/28/2014   LDLCALC 108* 10/28/2014   TRIG 109 10/28/2014   CHOLHDL 3.5 10/28/2014   Lab Results  Component Value Date   HGBA1C 6.1* 10/28/2014   No results found for: VITAMINB12 Lab  Results  Component Value Date   TSH 4.212 10/28/2014      ASSESSMENT AND PLAN 69 y.o. year old female  has a past medical history of Heart murmur; Headache(784.0); COPD (chronic obstructive pulmonary disease) ([facility]); GERD (gastroesophageal reflux disease); PVD (peripheral vascular disease) ([place]); Hyperlipidemia; Cervicalgia; Migraine without aura, with intractable migraine, so stated, without mention of status migrainosus; PVD (peripheral vascular disease) ([place]); Osteoarthritis; Chronic back pain; Trigeminal neuralgia; Shortness of breath dyspnea; and Seizures ([place]) (07/2014, 10/2014). here with:  1. Migraine headaches  I consulted with Dr. Leonie Man. At this time we will retry the patient on tizanidine 2 mg at bedtime. This dose may have to be increased to receive maximal benefit. Patient is amenable to this plan. She has tried this medication in the past without any benefit. We will retry this to see if it offers any benefit now. Patient advised that if her symptoms worsen or she develops new symptoms she should let us know. She should be cautious in taking tizanidine in addition to hydrocodone as it can cause drowsiness and CNS depression. Patient verbalized understanding. She will follow-up in 3-4 months or sooner if needed.  Ward Givens, MSN, NP-C 07/18/2015, 3:13 PM [facility] [address], [place], [place] [address] [phone]

## 2015-07-18 NOTE — Progress Notes (Signed)
I agree with the above plan 

## 2015-07-18 NOTE — Patient Instructions (Addendum)
Retry Tizanidine 2 mg at bedtime.  Dose may need to be increased if not beneficial. If your symptoms worsen or you develop new symptoms please let us know.    Tizanidine tablets or capsules What is this medicine? TIZANIDINE (tye ZAN i deen) helps to relieve muscle spasms. It may be used to help in the treatment of multiple sclerosis and spinal cord injury. This medicine may be used for other purposes; ask your health care Aaisha Sliter or pharmacist if you have questions. What should I tell my health care Shields Pautz before I take this medicine? They need to know if you have any of these conditions: -kidney disease -liver disease -low blood pressure -mental disorder -an unusual or allergic reaction to tizanidine, other medicines, lactose (tablets only), foods, dyes, or preservatives -pregnant or trying to get pregnant -breast-feeding How should I use this medicine? Take this medicine by mouth with a full glass of water. Take this medicine on an empty stomach, at least 30 minutes before or 2 hours after food. Do not take with food unless you talk with your doctor. Follow the directions on the prescription label. Take your medicine at regular intervals. Do not take your medicine more often than directed. Do not stop taking except on your doctor's advice. Suddenly stopping the medicine can be very dangerous. Talk to your pediatrician regarding the use of this medicine in children. Patients over 56 years old may have a stronger reaction and need a smaller dose. Overdosage: If you think you have taken too much of this medicine contact a poison control center or emergency room at once. NOTE: This medicine is only for you. Do not share this medicine with others. What if I miss a dose? If you miss a dose, take it as soon as you can. If it is almost time for your next dose, take only that dose. Do not take double or extra doses. What may interact with this medicine? Do not take this medicine with any of the  following medications: -ciprofloxacin -clonidine -fluvoxamine -guanabenz -guanfacine -methyldopa This medicine may also interact with the following medications: -acyclovir -alcohol -antihistamines -baclofen -barbiturates like phenobarbital -benzodiazepines -cimetidine -famotidine -female hormones, like estrogens or progestins and birth control pills -medicines for high blood pressure -medicines for irregular heartbeat -medicines for pain like codeine, morphine, and hydrocodone -medicines for sleep -rofecoxib -some antibiotics like levofloxacin, ofloxacin -ticlopidine -zileuton This list may not describe all possible interactions. Give your health care Taegan Haider a list of all the medicines, herbs, non-prescription drugs, or dietary supplements you use. Also tell them if you smoke, drink alcohol, or use illegal drugs. Some items may interact with your medicine. What should I watch for while using this medicine? You may get drowsy or dizzy. Do not drive, use machinery, or do anything that needs mental alertness until you know how this medicine affects you. Do not stand or sit up quickly, especially if you are an older patient. This reduces the risk of dizzy or fainting spells. Alcohol may interfere with the effect of this medicine. Avoid alcoholic drinks. Your mouth may get dry. Chewing sugarless gum or sucking hard candy, and drinking plenty of water may help. Contact your doctor if the problem does not go away or is severe. What side effects may I notice from receiving this medicine? Side effects that you should report to your doctor or health care professional as soon as possible: -allergic reactions like skin rash, itching or hives, swelling of the face, lips, or tongue -blurred vision -fainting  spells -hallucinations -nausea or vomiting -nervousness -redness, blistering, peeling or loosening of the skin, including inside the mouth -slow or irregular heartbeat, palpitations, or  chest pain -yellowing of the skin or eyes Side effects that usually do not require medical attention (report to your doctor or health care professional if they continue or are bothersome): -dizziness -drowsiness -dry mouth -tiredness or weakness This list may not describe all possible side effects. Call your doctor for medical advice about side effects. You may report side effects to FDA at 1-800-FDA-1088. Where should I keep my medicine? Keep out of the reach of children. Store at room temperature between 15 and 30 degrees C (59 and 86 degrees F). Throw away any unused medicine after the expiration date. NOTE: This sheet is a summary. It may not cover all possible information. If you have questions about this medicine, talk to your doctor, pharmacist, or health care Jovanni Eckhart.    2016, Elsevier/Gold Standard. (2008-04-29 12:38:02)

## 2015-07-20 NOTE — Progress Notes (Signed)
HPI: Follow-up chest pain. Chest CT February 2016 showed no pulmonary embolus. Right breast nodule and mammogram recommended. Echocardiogram March 2016 showed normal LV function, mild left ventricular enlargement, Mild left ventricular hypertrophy, grade 1 diastolic dysfunction and mild biatrial enlargement. Nuclear study March 2016 showed ejection fraction 58%. Breast attenuation but no ischemia. Seen by Cecilie Kicks for chest pain recently. Nuclear study repeated October 2016. Defects described in the anterior and inferior walls partially reversible. Felt possibly secondary to attenuation but ischemia cannot be excluded. EF 65. Since last seen, Patient continues to have occasional chest pain. She has had intermittent chest pain since 69 years of age. It is in the left breast area and described as a sharp shooting pain. It lasts 5 minutes at a time. It is not pleuritic, positional or exertional. No radiation. There is shortness of breath but no nausea or diaphoresis. She has limited mobility but denies exertional chest pain. She does have dyspnea on exertion.  Current Outpatient Prescriptions  Medication Sig Dispense Refill  . carbamazepine (TEGRETOL-XR) 100 MG 12 hr tablet Take 1 tablet (100 mg total) by mouth 2 (two) times daily. 30 tablet 5  . HYDROcodone-acetaminophen (NORCO) 10-325 MG per tablet Take 1 tablet by mouth as needed.    . promethazine (PHENERGAN) 12.5 MG tablet Take 1 tablet (12.5 mg total) by mouth every 6 (six) hours as needed for nausea or vomiting. 30 tablet 0  . rizatriptan (MAXALT) 10 MG tablet Take 1 tablet (10 mg total) by mouth as needed for migraine. May repeat in 2 hours if needed 10 tablet 3  . tiZANidine (ZANAFLEX) 2 MG tablet Take 1 tablet (2 mg total) by mouth at bedtime. 30 tablet 0  . Umeclidinium-Vilanterol (ANORO ELLIPTA) 62.5-25 MCG/INH AEPB Inhale 1 puff into the lungs daily. 30 each 11   No current facility-administered medications for this visit.    Facility-Administered Medications Ordered in Other Visits  Medication Dose Route Frequency Provider Last Rate Last Dose  . aminophylline injection 75 mg  75 mg Intravenous Once Dorothy Spark, MD         Past Medical History  Diagnosis Date  . Heart murmur   . Headache(784.0)   . COPD (chronic obstructive pulmonary disease) ([place])   . GERD (gastroesophageal reflux disease)   . PVD (peripheral vascular disease) ([place])   . Hyperlipidemia   . Cervicalgia   . Migraine without aura, with intractable migraine, so stated, without mention of status migrainosus   . PVD (peripheral vascular disease) ([place])   . Osteoarthritis   . Chronic back pain   . Trigeminal neuralgia   . Shortness of breath dyspnea   . Seizures ([place]) 07/2014, 10/2014    Past Surgical History  Procedure Laterality Date  . Dental surgery      Social History   Social History  . Marital Status: Single    Spouse Name: N/A  . Number of Children: 1  . Years of Education: 12th   Occupational History  . DISABLED    Social History Main Topics  . Smoking status: Former Smoker    Types: Cigarettes    Quit date: 08/22/2010  . Smokeless tobacco: Never Used  . Alcohol Use: No  . Drug Use: No  . Sexual Activity: No   Other Topics Concern  . Not on file   Social History Narrative   Patient lives at home with her brother.    Disabled.   Education high school.   Right  handed.   Caffeine Use: 5-6 20oz bottle sodas daily    ROS: no fevers or chills, productive cough, hemoptysis, dysphasia, odynophagia, melena, hematochezia, dysuria, hematuria, rash, seizure activity, orthopnea, PND, pedal edema, claudication. Remaining systems are negative.  Physical Exam: Well-developed morbidly obese in no acute distress.  Skin is warm and dry.  HEENT is normal.  Neck is supple.  Chest is clear to auscultation with normal expansion.  Cardiovascular exam is regular rate and rhythm.  Abdominal exam nontender or distended.  No masses palpated. Extremities show no edema. neuro grossly intact

## 2015-07-26 DIAGNOSIS — G894 Chronic pain syndrome: Secondary | ICD-10-CM | POA: Diagnosis not present

## 2015-07-26 DIAGNOSIS — Z79899 Other long term (current) drug therapy: Secondary | ICD-10-CM | POA: Diagnosis not present

## 2015-07-26 DIAGNOSIS — M47817 Spondylosis without myelopathy or radiculopathy, lumbosacral region: Secondary | ICD-10-CM | POA: Diagnosis not present

## 2015-07-27 ENCOUNTER — Encounter: Payer: Self-pay | Admitting: Cardiology

## 2015-07-27 ENCOUNTER — Ambulatory Visit (INDEPENDENT_AMBULATORY_CARE_PROVIDER_SITE_OTHER): Payer: Medicare Other | Admitting: Cardiology

## 2015-07-27 VITALS — BP 148/86 | HR 89 | Ht 65.0 in

## 2015-07-27 DIAGNOSIS — E785 Hyperlipidemia, unspecified: Secondary | ICD-10-CM | POA: Diagnosis not present

## 2015-07-27 DIAGNOSIS — R072 Precordial pain: Secondary | ICD-10-CM

## 2015-07-27 DIAGNOSIS — E669 Obesity, unspecified: Secondary | ICD-10-CM | POA: Diagnosis not present

## 2015-07-27 DIAGNOSIS — I200 Unstable angina: Secondary | ICD-10-CM | POA: Diagnosis not present

## 2015-07-27 NOTE — Assessment & Plan Note (Signed)
Management per primary care. 

## 2015-07-27 NOTE — Patient Instructions (Signed)
Your physician recommends that you schedule a follow-up appointment in: 3 MONTHS WITH DR CRENSHAW  If you need a refill on your cardiac medications before your next appointment, please call your pharmacy.   

## 2015-07-27 NOTE — Assessment & Plan Note (Addendum)
Symptoms are extremely atypical. They have been intermittent since she was 69 years of age. Her nuclear study is mildly abnormal but she is morbidly obese and soft tissue attenuation could be contributing. Long discussion with patient today concerning options. We discussed cardiac catheterization for definitive evaluation. The risks and benefits were discussed. She is hesitant and preferred to not proceed at this point. I will see her back in 3 months to make sure that she is stable. Continue ASA.

## 2015-07-27 NOTE — Assessment & Plan Note (Signed)
We discussed the importance of weight loss. 

## 2015-08-09 ENCOUNTER — Other Ambulatory Visit: Payer: Self-pay | Admitting: Adult Health

## 2015-08-24 ENCOUNTER — Inpatient Hospital Stay (HOSPITAL_COMMUNITY)
Admission: EM | Admit: 2015-08-24 | Discharge: 2015-08-28 | DRG: 291 | Disposition: A | Discharge status: Home / Self Care | Payer: Medicare Other | Attending: Internal Medicine | Admitting: Internal Medicine

## 2015-08-24 ENCOUNTER — Encounter (HOSPITAL_COMMUNITY): Payer: Self-pay | Admitting: *Deleted

## 2015-08-24 ENCOUNTER — Emergency Department (HOSPITAL_COMMUNITY): Payer: Medicare Other

## 2015-08-24 DIAGNOSIS — I110 Hypertensive heart disease with heart failure: Principal | ICD-10-CM | POA: Diagnosis present

## 2015-08-24 DIAGNOSIS — Z9981 Dependence on supplemental oxygen: Secondary | ICD-10-CM | POA: Diagnosis not present

## 2015-08-24 DIAGNOSIS — I5033 Acute on chronic diastolic (congestive) heart failure: Secondary | ICD-10-CM | POA: Diagnosis present

## 2015-08-24 DIAGNOSIS — J9601 Acute respiratory failure with hypoxia: Secondary | ICD-10-CM

## 2015-08-24 DIAGNOSIS — K219 Gastro-esophageal reflux disease without esophagitis: Secondary | ICD-10-CM | POA: Diagnosis present

## 2015-08-24 DIAGNOSIS — I10 Essential (primary) hypertension: Secondary | ICD-10-CM | POA: Diagnosis not present

## 2015-08-24 DIAGNOSIS — Z87891 Personal history of nicotine dependence: Secondary | ICD-10-CM | POA: Diagnosis not present

## 2015-08-24 DIAGNOSIS — R079 Chest pain, unspecified: Secondary | ICD-10-CM | POA: Diagnosis not present

## 2015-08-24 DIAGNOSIS — G43909 Migraine, unspecified, not intractable, without status migrainosus: Secondary | ICD-10-CM

## 2015-08-24 DIAGNOSIS — R569 Unspecified convulsions: Secondary | ICD-10-CM

## 2015-08-24 DIAGNOSIS — G8929 Other chronic pain: Secondary | ICD-10-CM | POA: Diagnosis present

## 2015-08-24 DIAGNOSIS — Z9989 Dependence on other enabling machines and devices: Secondary | ICD-10-CM

## 2015-08-24 DIAGNOSIS — G43019 Migraine without aura, intractable, without status migrainosus: Secondary | ICD-10-CM | POA: Diagnosis present

## 2015-08-24 DIAGNOSIS — I739 Peripheral vascular disease, unspecified: Secondary | ICD-10-CM | POA: Diagnosis present

## 2015-08-24 DIAGNOSIS — I5031 Acute diastolic (congestive) heart failure: Secondary | ICD-10-CM | POA: Diagnosis not present

## 2015-08-24 DIAGNOSIS — E662 Morbid (severe) obesity with alveolar hypoventilation: Secondary | ICD-10-CM | POA: Diagnosis present

## 2015-08-24 DIAGNOSIS — J449 Chronic obstructive pulmonary disease, unspecified: Secondary | ICD-10-CM | POA: Diagnosis present

## 2015-08-24 DIAGNOSIS — Z6841 Body Mass Index (BMI) 40.0 and over, adult: Secondary | ICD-10-CM | POA: Diagnosis not present

## 2015-08-24 DIAGNOSIS — R05 Cough: Secondary | ICD-10-CM | POA: Diagnosis not present

## 2015-08-24 DIAGNOSIS — M549 Dorsalgia, unspecified: Secondary | ICD-10-CM | POA: Diagnosis present

## 2015-08-24 DIAGNOSIS — J9620 Acute and chronic respiratory failure, unspecified whether with hypoxia or hypercapnia: Secondary | ICD-10-CM | POA: Diagnosis present

## 2015-08-24 DIAGNOSIS — M62838 Other muscle spasm: Secondary | ICD-10-CM | POA: Diagnosis present

## 2015-08-24 DIAGNOSIS — J9600 Acute respiratory failure, unspecified whether with hypoxia or hypercapnia: Secondary | ICD-10-CM

## 2015-08-24 DIAGNOSIS — J441 Chronic obstructive pulmonary disease with (acute) exacerbation: Secondary | ICD-10-CM | POA: Diagnosis not present

## 2015-08-24 DIAGNOSIS — J440 Chronic obstructive pulmonary disease with acute lower respiratory infection: Secondary | ICD-10-CM | POA: Diagnosis present

## 2015-08-24 DIAGNOSIS — G4733 Obstructive sleep apnea (adult) (pediatric): Secondary | ICD-10-CM | POA: Diagnosis present

## 2015-08-24 HISTORY — DX: Essential (primary) hypertension: I10

## 2015-08-24 HISTORY — DX: Acute diastolic (congestive) heart failure: I50.31

## 2015-08-24 HISTORY — DX: Dependence on other enabling machines and devices: Z99.89

## 2015-08-24 HISTORY — DX: Obstructive sleep apnea (adult) (pediatric): G47.33

## 2015-08-24 LAB — HEPATIC FUNCTION PANEL
ALK PHOS: 78 U/L (ref 38–126)
ALT: 14 U/L (ref 14–54)
AST: 16 U/L (ref 15–41)
Albumin: 3 g/dL — ABNORMAL LOW (ref 3.5–5.0)
BILIRUBIN DIRECT: 0.3 mg/dL (ref 0.1–0.5)
BILIRUBIN INDIRECT: 0.7 mg/dL (ref 0.3–0.9)
BILIRUBIN TOTAL: 1 mg/dL (ref 0.3–1.2)
Total Protein: 6.8 g/dL (ref 6.5–8.1)

## 2015-08-24 LAB — BASIC METABOLIC PANEL
Anion gap: 10 (ref 5–15)
BUN: 13 mg/dL (ref 6–20)
CO2: 25 mmol/L (ref 22–32)
Calcium: 9.1 mg/dL (ref 8.9–10.3)
Chloride: 100 mmol/L — ABNORMAL LOW (ref 101–111)
Creatinine, Ser: 1 mg/dL (ref 0.44–1.00)
GFR calc Af Amer: >60 mL/min (ref >60)
GFR calc non Af Amer: 57 mL/min — ABNORMAL LOW (ref >60)
Glucose, Bld: 128 mg/dL — ABNORMAL HIGH (ref 65–99)
Potassium: 4.3 mmol/L (ref 3.5–5.1)
Sodium: 135 mmol/L (ref 135–145)

## 2015-08-24 LAB — CBC
HEMATOCRIT: 38.4 % (ref 36.0–46.0)
Hemoglobin: 11.9 g/dL — ABNORMAL LOW (ref 12.0–15.0)
MCH: 27.7 pg (ref 26.0–34.0)
MCHC: 31 g/dL (ref 30.0–36.0)
MCV: 89.5 fL (ref 78.0–100.0)
Platelets: 231 10*3/uL (ref 150–400)
RBC: 4.29 MIL/uL (ref 3.87–5.11)
RDW: 14.7 % (ref 11.5–15.5)
WBC: 10.8 10*3/uL — ABNORMAL HIGH (ref 4.0–10.5)

## 2015-08-24 LAB — URINALYSIS, ROUTINE W REFLEX MICROSCOPIC
Bilirubin Urine: NEGATIVE
Glucose, UA: NEGATIVE mg/dL
Ketones, ur: NEGATIVE mg/dL
Nitrite: NEGATIVE
Protein, ur: NEGATIVE mg/dL
Specific Gravity, Urine: 1.01 (ref 1.005–1.030)
pH: 7 (ref 5.0–8.0)

## 2015-08-24 LAB — BRAIN NATRIURETIC PEPTIDE: B Natriuretic Peptide: 51.2 pg/mL (ref 0.0–100.0)

## 2015-08-24 LAB — I-STAT TROPONIN, ED: Troponin i, poc: 0.02 ng/mL (ref 0.00–0.08)

## 2015-08-24 LAB — LIPASE, BLOOD: LIPASE: 20 U/L (ref 11–51)

## 2015-08-24 LAB — URINE MICROSCOPIC-ADD ON

## 2015-08-24 MED ORDER — CARBAMAZEPINE ER 100 MG PO TB12
100.0000 mg | ORAL_TABLET | Freq: Two times a day (BID) | ORAL | Status: DC
  Administered 2015-08-24 – 2015-08-28 (×8): 100 mg via ORAL
  Filled 2015-08-24 (×11): qty 1

## 2015-08-24 MED ORDER — ENSURE ENLIVE PO LIQD
237.0000 mL | Freq: Two times a day (BID) | ORAL | Status: DC
  Administered 2015-08-24 – 2015-08-27 (×6): 237 mL via ORAL

## 2015-08-24 MED ORDER — KETOROLAC TROMETHAMINE 15 MG/ML IJ SOLN
15.0000 mg | Freq: Three times a day (TID) | INTRAMUSCULAR | Status: DC | PRN
  Administered 2015-08-24: 15 mg via INTRAVENOUS
  Filled 2015-08-24: qty 1

## 2015-08-24 MED ORDER — ONDANSETRON HCL 4 MG/2ML IJ SOLN
4.0000 mg | Freq: Four times a day (QID) | INTRAMUSCULAR | Status: DC | PRN
  Administered 2015-08-24 – 2015-08-28 (×4): 4 mg via INTRAVENOUS
  Filled 2015-08-24 (×4): qty 2

## 2015-08-24 MED ORDER — ONDANSETRON HCL 4 MG PO TABS: 4.0000 mg | ORAL_TABLET | Freq: Four times a day (QID) | ORAL | Status: DC | PRN

## 2015-08-24 MED ORDER — SODIUM CHLORIDE 0.9 % IJ SOLN: 3.0000 mL | Freq: Two times a day (BID) | INTRAMUSCULAR | Status: DC

## 2015-08-24 MED ORDER — ZOLPIDEM TARTRATE 5 MG PO TABS
5.0000 mg | ORAL_TABLET | Freq: Once | ORAL | Status: AC
  Administered 2015-08-24: 5 mg via ORAL
  Filled 2015-08-24: qty 1

## 2015-08-24 MED ORDER — ACETAMINOPHEN 650 MG RE SUPP: 650.0000 mg | Freq: Four times a day (QID) | RECTAL | Status: DC | PRN

## 2015-08-24 MED ORDER — HYDRALAZINE HCL 20 MG/ML IJ SOLN: 5.0000 mg | INTRAMUSCULAR | Status: DC | PRN

## 2015-08-24 MED ORDER — METHYLPREDNISOLONE SODIUM SUCC 125 MG IJ SOLR
60.0000 mg | Freq: Four times a day (QID) | INTRAMUSCULAR | Status: DC
  Administered 2015-08-24 – 2015-08-25 (×4): 60 mg via INTRAVENOUS
  Filled 2015-08-24 (×4): qty 2

## 2015-08-24 MED ORDER — SODIUM CHLORIDE 0.9 % IJ SOLN: 3.0000 mL | INTRAMUSCULAR | Status: DC | PRN

## 2015-08-24 MED ORDER — GUAIFENESIN ER 600 MG PO TB12
1200.0000 mg | ORAL_TABLET | Freq: Two times a day (BID) | ORAL | Status: DC
  Administered 2015-08-24 – 2015-08-28 (×9): 1200 mg via ORAL
  Filled 2015-08-24 (×9): qty 2

## 2015-08-24 MED ORDER — FUROSEMIDE 10 MG/ML IJ SOLN
20.0000 mg | Freq: Two times a day (BID) | INTRAMUSCULAR | Status: DC
  Administered 2015-08-24 – 2015-08-25 (×2): 20 mg via INTRAVENOUS
  Filled 2015-08-24 (×2): qty 2

## 2015-08-24 MED ORDER — HYDROCODONE-ACETAMINOPHEN 10-325 MG PO TABS
1.0000 | ORAL_TABLET | ORAL | Status: DC | PRN
  Administered 2015-08-24: 1 via ORAL
  Filled 2015-08-24: qty 1

## 2015-08-24 MED ORDER — UMECLIDINIUM-VILANTEROL 62.5-25 MCG/INH IN AEPB: 1.0000 | INHALATION_SPRAY | Freq: Every day | RESPIRATORY_TRACT | Status: DC

## 2015-08-24 MED ORDER — SODIUM CHLORIDE 0.9 % IV SOLN: 250.0000 mL | INTRAVENOUS | Status: DC | PRN

## 2015-08-24 MED ORDER — LEVOFLOXACIN IN D5W 750 MG/150ML IV SOLN
750.0000 mg | INTRAVENOUS | Status: DC
  Administered 2015-08-24 – 2015-08-25 (×2): 750 mg via INTRAVENOUS
  Filled 2015-08-24 (×3): qty 150

## 2015-08-24 MED ORDER — HYDROCODONE-ACETAMINOPHEN 10-325 MG PO TABS
1.0000 | ORAL_TABLET | Freq: Four times a day (QID) | ORAL | Status: DC | PRN
  Administered 2015-08-26 – 2015-08-27 (×2): 1 via ORAL
  Filled 2015-08-24 (×2): qty 1

## 2015-08-24 MED ORDER — UMECLIDINIUM-VILANTEROL 62.5-25 MCG/INH IN AEPB
1.0000 | INHALATION_SPRAY | Freq: Every morning | RESPIRATORY_TRACT | Status: DC
  Administered 2015-08-25: 1 via RESPIRATORY_TRACT

## 2015-08-24 MED ORDER — SODIUM CHLORIDE 0.9 % IJ SOLN
3.0000 mL | Freq: Two times a day (BID) | INTRAMUSCULAR | Status: DC
  Administered 2015-08-24: 3 mL via INTRAVENOUS

## 2015-08-24 MED ORDER — FUROSEMIDE 10 MG/ML IJ SOLN
20.0000 mg | Freq: Once | INTRAMUSCULAR | Status: AC
  Administered 2015-08-24: 20 mg via INTRAVENOUS
  Filled 2015-08-24: qty 2

## 2015-08-24 MED ORDER — ENOXAPARIN SODIUM 40 MG/0.4ML ~~LOC~~ SOLN
40.0000 mg | SUBCUTANEOUS | Status: DC
  Administered 2015-08-24: 40 mg via SUBCUTANEOUS
  Filled 2015-08-24: qty 0.4

## 2015-08-24 MED ORDER — TIZANIDINE HCL 2 MG PO TABS
2.0000 mg | ORAL_TABLET | Freq: Every day | ORAL | Status: DC
  Administered 2015-08-24 – 2015-08-27 (×4): 2 mg via ORAL
  Filled 2015-08-24 (×7): qty 1

## 2015-08-24 MED ORDER — ACETAMINOPHEN 325 MG PO TABS
650.0000 mg | ORAL_TABLET | Freq: Four times a day (QID) | ORAL | Status: DC | PRN
  Filled 2015-08-24: qty 2

## 2015-08-24 MED ORDER — NON FORMULARY: 1.0000 | Freq: Every morning | Status: DC

## 2015-08-24 MED ORDER — LISINOPRIL 2.5 MG PO TABS
2.5000 mg | ORAL_TABLET | Freq: Every day | ORAL | Status: DC
  Administered 2015-08-24: 2.5 mg via ORAL
  Filled 2015-08-24 (×2): qty 1

## 2015-08-24 MED ORDER — IPRATROPIUM-ALBUTEROL 0.5-2.5 (3) MG/3ML IN SOLN
3.0000 mL | Freq: Once | RESPIRATORY_TRACT | Status: AC
  Administered 2015-08-24: 3 mL via RESPIRATORY_TRACT
  Filled 2015-08-24: qty 3

## 2015-08-24 MED ORDER — SUMATRIPTAN SUCCINATE 50 MG PO TABS
50.0000 mg | ORAL_TABLET | ORAL | Status: DC | PRN
  Administered 2015-08-24 – 2015-08-25 (×3): 50 mg via ORAL
  Filled 2015-08-24 (×6): qty 1

## 2015-08-24 NOTE — Progress Notes (Signed)
ANTIBIOTIC CONSULT NOTE - INITIAL  Pharmacy Consult for levofloxacin Indication: COPD exacerbation  Allergies  Allergen Reactions  . Baclofen Other (See Comments)    Causes seizures    Patient Measurements: Height: 5\' 5"  (165.1 cm) Weight: (!) 326 lb (147.873 kg) IBW/kg (Calculated) : 57 Adjusted Body Weight: 92.9 kg  Vital Signs: Temp: 100.7 F (38.2 C) (12/28 0848) Temp Source: Oral (12/28 0848) BP: 157/83 mmHg (12/28 1130) Pulse Rate: 90 (12/28 1130) Intake/Output from previous day:   Intake/Output from this shift:    Labs:  Recent Labs  08/24/15 0924  WBC 10.8*  HGB 11.9*  PLT 231  CREATININE 1.00   Estimated Creatinine Clearance: 80.5 mL/min (by C-G formula based on Cr of 1). No results for input(s): VANCOTROUGH, VANCOPEAK, VANCORANDOM, GENTTROUGH, GENTPEAK, GENTRANDOM, TOBRATROUGH, TOBRAPEAK, TOBRARND, AMIKACINPEAK, AMIKACINTROU, AMIKACIN in the last 72 hours.   Microbiology: No results found for this or any previous visit (from the past 720 hour(s)).  Medical History: Past Medical History  Diagnosis Date  . Heart murmur   . Headache(784.0)   . COPD (chronic obstructive pulmonary disease) ([place])   . GERD (gastroesophageal reflux disease)   . PVD (peripheral vascular disease) ([place])   . Hyperlipidemia   . Cervicalgia   . Migraine without aura, with intractable migraine, so stated, without mention of status migrainosus   . PVD (peripheral vascular disease) ([place])   . Osteoarthritis   . Chronic back pain   . Trigeminal neuralgia   . Shortness of breath dyspnea   . Seizures ([place]) 07/2014, 10/2014  . Acute diastolic CHF (congestive heart failure) ([place]) 08/24/2015  . Essential hypertension 08/24/2015   Assessment: 69 yo F presents to ED with gradual onset of SOB x1 week. Patient reports intermittent productive cough and subjective fevers. Pharmacy consulted to start levofloxacin.  WBC 10.8, Tmax 100.7, SCr 1.00, CrCl ~80 ml/min  Goal of Therapy:   Eradication of infection  Plan:  - Levofloxacin 750 mg IV q24h - Monitor C&S, CBC, vitals and clinical progression  Dimitri Ped, PharmD. PGY-1 Pharmacy Resident Pager: [phone]  08/24/2015,12:52 PM

## 2015-08-24 NOTE — Progress Notes (Addendum)
2:59 PM Report received Ben.

## 2015-08-24 NOTE — H&P (Signed)
[facility] History and Physical  Janice Morrow [id number] DOB: [birth year] DOA: 08/24/2015  Referring physician: Dr Veda Canning - [facility] PCP: [name], MD   Chief Complaint: SOB  HPI: Janice Morrow is a 69 y.o. female  Shortness of breath. Gradual onset. Started 1 week ago. Constant. Getting worse. Associated with intermittent productive cough and subjective fevers. He wears CPAP when sleeping. He does not have any rescue inhalers. Associated with mild diffuse chest pain when patient coughs or takes a deep breath but otherwise no chest pain. Worse with ambulation. Improves with rest. At baseline patient is able to sleep lying down but over the last week has had to sleep sitting in a recliner. Additionally patient endorses 3 weeks of lower extremity edema   Review of Systems:  Constitutional:  No weight loss, night sweats,  HEENT:  No headaches, Difficulty swallowing,Tooth/dental problems,Sore throat, Cardio-vascular:  No PND,  anasarca, dizziness, palpitations  GI:  No heartburn, indigestion, abdominal pain, nausea, vomiting, diarrhea, change in bowel habits, Resp:Per HPi Skin:  no rash or lesions.  GU:  no dysuria, change in color of urine, no urgency or frequency. No flank pain.  Musculoskeletal:   No joint pain or swelling. No decreased range of motion. No back pain.  Psych:  No change in mood or affect. No depression or anxiety. No memory loss.  Neuro:  No change in sensation, unilateral strength, or cognitive abilities  All other systems were reviewed and are negative.  Past Medical History  Diagnosis Date  . Heart murmur   . Headache(784.0)   . COPD (chronic obstructive pulmonary disease) ([place])   . GERD (gastroesophageal reflux disease)   . PVD (peripheral vascular disease) ([place])   . Hyperlipidemia   . Cervicalgia   . Migraine without aura, with intractable migraine, so stated, without mention of status migrainosus   . PVD (peripheral vascular disease)  ([place])   . Osteoarthritis   . Chronic back pain   . Trigeminal neuralgia   . Shortness of breath dyspnea   . Seizures ([place]) 07/2014, 10/2014  . Acute diastolic CHF (congestive heart failure) ([place]) 08/24/2015  . Essential hypertension 08/24/2015   Past Surgical History  Procedure Laterality Date  . Dental surgery     Social History:  reports that she quit smoking about 5 years ago. Her smoking use included Cigarettes. She has never used smokeless tobacco. She reports that she does not drink alcohol or use illicit drugs.  Allergies  Allergen Reactions  . Baclofen Other (See Comments)    Causes seizures    Family History  Problem Relation Age of Onset  . Heart attack Father   . Heart disease    . Diabetes    . Diabetes Mother   . Dementia Mother   . Diabetes Brother      Prior to Admission medications   Medication Sig Start Date End Date Taking? Authorizing Provider  carbamazepine (TEGRETOL-XR) 100 MG 12 hr tablet Take 1 tablet (100 mg total) by mouth 2 (two) times daily. 04/11/15  Yes Janith Lima, MD  HYDROcodone-acetaminophen (NORCO) 10-325 MG per tablet Take 1 tablet by mouth as needed.   Yes Historical Provider, MD  tiZANidine (ZANAFLEX) 2 MG tablet TAKE 1 TABLET (2 MG TOTAL) BY MOUTH AT BEDTIME. 08/09/15  Yes Ward Givens, NP  Umeclidinium-Vilanterol (ANORO ELLIPTA) 62.5-25 MCG/INH AEPB Inhale 1 puff into the lungs daily. 03/08/15  Yes Janith Lima, MD  rizatriptan (MAXALT) 10 MG tablet Take 1 tablet (10 mg  total) by mouth as needed for migraine. May repeat in 2 hours if needed 07/12/15   Janith Lima, MD   Physical Exam: Filed Vitals:   08/24/15 0848 08/24/15 1100 08/24/15 1130  BP: 155/82 149/96 157/83  Pulse: 94 86 90  Temp: 100.7 F (38.2 C)    TempSrc: Oral    Resp: 22 14 15   Height: 5\' 5"  (1.651 m)    Weight: 147.873 kg (326 lb)    SpO2: 90% 94% 92%    Wt Readings from Last 3 Encounters:  08/24/15 147.873 kg (326 lb)  07/12/15 152.862 kg (337 lb)    06/16/15 152.862 kg (337 lb)    General: Mild distress Eyes:  PERRL, EOMI, normal lids, iris ENT:  grossly normal hearing, lips & tongue Neck:  no LAD, masses or thyromegaly Cardiovascular:  RRR, no m/r/g. 1+ Le pitting edema Respiratory: Decreased breath sounds throughout. Exam somewhat limited due to patient's body habitus. Occasional faint end expiratory wheeze. Decreased breath sounds in bases. Increased effort. Abdomen:  soft, ntnd Skin:  no rash or induration seen on limited exam Musculoskeletal:  grossly normal tone BUE/BLE Psychiatric:  grossly normal mood and affect, speech fluent and appropriate Neurologic:  CN 2-12 grossly intact, moves all extremities in coordinated fashion.          Labs on Admission:  Basic Metabolic Panel:  Recent Labs Lab 08/24/15 0924  NA 135  K 4.3  CL 100*  CO2 25  GLUCOSE 128*  BUN 13  CREATININE 1.00  CALCIUM 9.1   Liver Function Tests:  Recent Labs Lab 08/24/15 1100  AST 16  ALT 14  ALKPHOS 78  BILITOT 1.0  PROT 6.8  ALBUMIN 3.0*    Recent Labs Lab 08/24/15 1100  LIPASE 20   No results for input(s): AMMONIA in the last 168 hours. CBC:  Recent Labs Lab 08/24/15 0924  WBC 10.8*  HGB 11.9*  HCT 38.4  MCV 89.5  PLT 231   Cardiac Enzymes: No results for input(s): CKTOTAL, CKMB, CKMBINDEX, TROPONINI in the last 168 hours.  BNP (last 3 results)  Recent Labs  10/19/14 2043 10/28/14 2158 08/24/15 0924  BNP 51.8 39.4 51.2    ProBNP (last 3 results) No results for input(s): PROBNP in the last 8760 hours.   CREATININE: 1 (08/24/15 0924) Estimated creatinine clearance - 80.5 mL/min  CBG: No results for input(s): GLUCAP in the last 168 hours.  Radiological Exams on Admission: Dg Chest 2 View  08/24/2015  CLINICAL DATA:  Cough and phlegm.  LEFT-sided back and chest pain. EXAM: CHEST  2 VIEW COMPARISON:  11/20/2014. FINDINGS: Cardiac enlargement. Interstitial prominence suggesting early edema or diffuse  infection. No lobar consolidation. Pulmonary arterial prominence suggesting PAH. No effusion or pneumothorax. No acute osseous findings. IMPRESSION: Cardiomegaly with suspected with pulmonary arterial hypertension. These changes appear worse compared with March 2016. Diffuse interstitial prominence of the lung fields. Early edema or viral infection not excluded. No lobar consolidation. Electronically Signed   By: Staci Righter M.D.   On: 08/24/2015 10:14     Assessment/Plan Principal Problem:   COPD exacerbation (HCC) Active Problems:   Obesity   Chronic back pain   OSA on CPAP   Acute diastolic CHF (congestive heart failure) (HCC)   Essential hypertension   Seizures (HCC)   Migraines   Acute respiratory failure (HCC)   Obesity hypoventilation syndrome (HCC)   Acute respiratory failure: Skin with new O2 requirement. Likely secondary to several factors including acute respiratory  infection from viral process, obesity hypoventilation, and COPD exacerbation, and mild congestive heart failure. Patient desats to 80% without oxygen during ambulation. BNP 51.2 fairly unremarkable but chest x-ray suggestive of CHF along with history of orthopnea and lower extremity pitting edema. WBC 10.8. AF and hemodynamically stable. - Tele - Solumedrol 60mg  Q6 - Duonebs Q4 - Levaquin - Mucinex - Diuresis as below - Continue Anoro  Acute diastolic congestive heart failure: BNP fairly unremarkable at 51.2. CXR suggestive of CHF. Currently with orthopnea and lower extremity edema. Echo 11/14/2014 showing EF 55% and grade 1 diastolic dysfunction. No diuresis. Lasix 20 mg IV given in ED with brisk diuresis - Lasix 20IV BID - Strict I/O, daily wts - start ACEi - Start bblocker as oupt after resolution of acute decompensation  HTN: no home medications.  - starte ACEi as above - Hydralazine prn  Seizures: - continue tegretol  Muscle spasms: - continue zanaflex, norco - Toradol  MIgraines:  -  continue maxalt PRN  Code Status: FULL  DVT Prophylaxis: Lovenox Family Communication: none Disposition Plan: Pending Improvement    MERRELL, DAVID Lenna Sciara, MD Family Medicine [facility] [email or web address] Password [email or web address]

## 2015-08-24 NOTE — ED Notes (Signed)
Pt reports chest pain and sob. Pt reports this has been ongoing for 1 week. Pt states that she has cough/congestiona as well. Pt also reporting burning with urination.

## 2015-08-24 NOTE — ED Notes (Signed)
Attempted report 

## 2015-08-24 NOTE — ED Provider Notes (Signed)
CSN: [id number]     Arrival date & time 08/24/15  [id number] History   First MD Initiated Contact with Patient 08/24/15 1006     Chief Complaint  Patient presents with  . Chest Pain     (Consider location/radiation/quality/duration/timing/severity/associated sxs/prior Treatment) HPI   Patient is 69 year old female with past medical history of COPD, PVD, seizures who presents to the ED with complaint of shortness of breath, onset one week. Patient endorses having worsening shortness of breath for the past week. Endorses fever, nasal congestion, rhinorrhea, productive cough, abdominal pain, nausea and dysuria. She endorses having intermittent aching chest pain to her lateral chest wall that she states is worse with deep breathing or coughing. Denies hemoptysis, vomiting, diarrhea, blood in urine or stool, vaginal bleeding, vaginal d/c, leg swelling. She notes she has been taking her albuterol inhaler at home without any relief. Denies any sick contacts.  Past Medical History  Diagnosis Date  . Heart murmur   . Headache(784.0)   . COPD (chronic obstructive pulmonary disease) ([place])   . GERD (gastroesophageal reflux disease)   . PVD (peripheral vascular disease) ([place])   . Hyperlipidemia   . Cervicalgia   . Migraine without aura, with intractable migraine, so stated, without mention of status migrainosus   . PVD (peripheral vascular disease) ([place])   . Osteoarthritis   . Chronic back pain   . Trigeminal neuralgia   . Shortness of breath dyspnea   . Seizures ([place]) 07/2014, 10/2014  . Acute diastolic CHF (congestive heart failure) ([place]) 08/24/2015  . Essential hypertension 08/24/2015   Past Surgical History  Procedure Laterality Date  . Dental surgery     Family History  Problem Relation Age of Onset  . Heart attack Father   . Heart disease    . Diabetes    . Diabetes Mother   . Dementia Mother   . Diabetes Brother    Social History  Substance Use Topics  . Smoking status: Former  Smoker    Types: Cigarettes    Quit date: 08/22/2010  . Smokeless tobacco: Never Used  . Alcohol Use: No   OB History    No data available     Review of Systems  Constitutional: Positive for fever.  HENT: Positive for congestion and rhinorrhea.   Respiratory: Positive for cough and shortness of breath.   Cardiovascular: Positive for chest pain.  Gastrointestinal: Positive for nausea and abdominal pain.  Genitourinary: Positive for dysuria.      Allergies  Baclofen  Home Medications   Prior to Admission medications   Medication Sig Start Date End Date Taking? Authorizing Provider  carbamazepine (TEGRETOL-XR) 100 MG 12 hr tablet Take 1 tablet (100 mg total) by mouth 2 (two) times daily. 04/11/15  Yes Janith Lima, MD  HYDROcodone-acetaminophen (NORCO) 10-325 MG per tablet Take 1 tablet by mouth as needed.   Yes Historical Provider, MD  tiZANidine (ZANAFLEX) 2 MG tablet TAKE 1 TABLET (2 MG TOTAL) BY MOUTH AT BEDTIME. 08/09/15  Yes Ward Givens, NP  Umeclidinium-Vilanterol (ANORO ELLIPTA) 62.5-25 MCG/INH AEPB Inhale 1 puff into the lungs daily. 03/08/15  Yes Janith Lima, MD  rizatriptan (MAXALT) 10 MG tablet Take 1 tablet (10 mg total) by mouth as needed for migraine. May repeat in 2 hours if needed 07/12/15   Janith Lima, MD   BP 157/83 mmHg  Pulse 90  Temp(Src) 100.7 F (38.2 C) (Oral)  Resp 15  Ht 5\' 5"  (1.651 m)  Wt 147.873 kg  BMI 54.25 kg/m2  SpO2 92% Physical Exam  Constitutional: She is oriented to person, place, and time. She appears well-developed and well-nourished. No distress.  HENT:  Head: Normocephalic and atraumatic.  Mouth/Throat: Oropharynx is clear and moist. No oropharyngeal exudate.  Eyes: Conjunctivae and EOM are normal. Right eye exhibits no discharge. Left eye exhibits no discharge. No scleral icterus.  Neck: Normal range of motion. Neck supple.  Cardiovascular: Normal rate, regular rhythm, normal heart sounds and intact distal pulses.    Pulmonary/Chest: Effort normal and breath sounds normal. She has no wheezes. She has no rales. She exhibits tenderness.  Increased work of breathing noted  Abdominal: Soft. Bowel sounds are normal. She exhibits no distension and no mass. There is tenderness (upper abdomen tender with light palpation). There is no rebound and no guarding.  Musculoskeletal:  Nonpitting edema noted to BLE.   Lymphadenopathy:    She has no cervical adenopathy.  Neurological: She is alert and oriented to person, place, and time.  Skin: Skin is warm and dry.  Nursing note and vitals reviewed.   ED Course  Procedures (including critical care time) Labs Review Labs Reviewed  BASIC METABOLIC PANEL - Abnormal; Notable for the following:    Chloride 100 (*)    Glucose, Bld 128 (*)    GFR calc non Af Amer 57 (*)    All other components within normal limits  CBC - Abnormal; Notable for the following:    WBC 10.8 (*)    Hemoglobin 11.9 (*)    All other components within normal limits  URINALYSIS, ROUTINE W REFLEX MICROSCOPIC (NOT AT [facility]) - Abnormal; Notable for the following:    Hgb urine dipstick SMALL (*)    Leukocytes, UA SMALL (*)    All other components within normal limits  HEPATIC FUNCTION PANEL - Abnormal; Notable for the following:    Albumin 3.0 (*)    All other components within normal limits  URINE MICROSCOPIC-ADD ON - Abnormal; Notable for the following:    Squamous Epithelial / LPF 0-5 (*)    Bacteria, UA FEW (*)    All other components within normal limits  LIPASE, BLOOD  BRAIN NATRIURETIC PEPTIDE  I-STAT TROPOININ, ED    Imaging Review Dg Chest 2 View  08/24/2015  CLINICAL DATA:  Cough and phlegm.  LEFT-sided back and chest pain. EXAM: CHEST  2 VIEW COMPARISON:  11/20/2014. FINDINGS: Cardiac enlargement. Interstitial prominence suggesting early edema or diffuse infection. No lobar consolidation. Pulmonary arterial prominence suggesting PAH. No effusion or pneumothorax. No acute  osseous findings. IMPRESSION: Cardiomegaly with suspected with pulmonary arterial hypertension. These changes appear worse compared with March 2016. Diffuse interstitial prominence of the lung fields. Early edema or viral infection not excluded. No lobar consolidation. Electronically Signed   By: Staci Righter M.D.   On: 08/24/2015 10:14   I have personally reviewed and evaluated these images and lab results as part of my medical decision-making.   EKG Interpretation None      MDM   Final diagnoses:  COPD exacerbation ([place])    Patient presents with worsening shortness of breath and associated URI symptoms. History of COPD. Temp 100.7. HR 94, normotensive. O2 94% on RA. Exam revealed pt with increased work of breathing, otherwise lungs CTAB, no wheezing, chest wall mildly TTP, nonpitting edema noted to BLE. Abdominal exam benign. EKG showed sinus rhythm, no significant changes from prior. Troponin 0.02. WBC 10.8. BNP 51.2. CXR showed diffuse interstitial prominence suggestive of early edema or  viral infection, no lobar consolidation present. I have a low suspicion for ACS, PE, dissection, or other acute cardiac event at this time.  11:05 - Nurse reports when pt stood up and ambulated to use the bathroom her O2 dropped into the 80s on RA. Consulted hospitalist for admission for COPD exacerbation. Dr. Marily Memos agrees to admission, orders placed for tele bed. Discussed results and plan for admission with pt.   Chesley Noon [place], [place] 08/24/15 1336  Milton Ferguson, MD 08/25/15 1020

## 2015-08-24 NOTE — Progress Notes (Signed)
Janice Morrow is a 69 y.o. female patient admitted from ED awake, alert - oriented  X 4 - no acute distress noted.  VSS - Blood pressure 145/97, pulse 77, temperature 98.7 F (37.1 C), temperature source Oral, resp. rate 20, height 5\' 5"  (1.651 m), weight 150.367 kg (331 lb 8 oz), SpO2 88 %.    IV in place, occlusive dsg intact without redness.  Orientation to room, and floor completed with information packet given to patient/family.  Patient declined safety video at this time.  Admission INP armband ID verified with patient/family, and in place.   SR up x 2, fall assessment complete, with patient and family able to verbalize understanding of risk associated with falls, and verbalized understanding to call nsg before up out of bed.  Call light within reach, patient able to voice, and demonstrate understanding.  Skin, clean-dry- intact without evidence of bruising, or skin tears.   No evidence of skin break down noted on exam.     Will cont to eval and treat per MD orders.  Teryl Lucy, RN 08/24/2015 4:16 PM

## 2015-08-24 NOTE — ED Notes (Signed)
Pt desated to 80% while using bedside commode.

## 2015-08-25 DIAGNOSIS — J441 Chronic obstructive pulmonary disease with (acute) exacerbation: Secondary | ICD-10-CM

## 2015-08-25 LAB — COMPREHENSIVE METABOLIC PANEL
ALK PHOS: 74 U/L (ref 38–126)
ALT: 14 U/L (ref 14–54)
AST: 15 U/L (ref 15–41)
Albumin: 2.9 g/dL — ABNORMAL LOW (ref 3.5–5.0)
Anion gap: 9 (ref 5–15)
BUN: 17 mg/dL (ref 6–20)
CHLORIDE: 101 mmol/L (ref 101–111)
CO2: 28 mmol/L (ref 22–32)
CREATININE: 1.06 mg/dL — AB (ref 0.44–1.00)
Calcium: 9.5 mg/dL (ref 8.9–10.3)
GFR calc Af Amer: >60 mL/min (ref >60)
GFR, EST NON AFRICAN AMERICAN: 53 mL/min — AB (ref >60)
Glucose, Bld: 201 mg/dL — ABNORMAL HIGH (ref 65–99)
Potassium: 4.7 mmol/L (ref 3.5–5.1)
Sodium: 138 mmol/L (ref 135–145)
Total Bilirubin: 0.4 mg/dL (ref 0.3–1.2)
Total Protein: 7.4 g/dL (ref 6.5–8.1)

## 2015-08-25 LAB — CBC
HEMATOCRIT: 42.4 % (ref 36.0–46.0)
HEMOGLOBIN: 12.9 g/dL (ref 12.0–15.0)
MCH: 27.4 pg (ref 26.0–34.0)
MCHC: 30.4 g/dL (ref 30.0–36.0)
MCV: 90 fL (ref 78.0–100.0)
PLATELETS: 267 10*3/uL (ref 150–400)
RBC: 4.71 MIL/uL (ref 3.87–5.11)
RDW: 14.6 % (ref 11.5–15.5)
WBC: 7.1 10*3/uL (ref 4.0–10.5)

## 2015-08-25 LAB — TROPONIN I: Troponin I: <0.03 ng/mL (ref <0.031)

## 2015-08-25 MED ORDER — FUROSEMIDE 10 MG/ML IJ SOLN
40.0000 mg | Freq: Every day | INTRAMUSCULAR | Status: DC
  Administered 2015-08-25 – 2015-08-27 (×3): 40 mg via INTRAVENOUS
  Filled 2015-08-25 (×4): qty 4

## 2015-08-25 MED ORDER — HYDRALAZINE HCL 20 MG/ML IJ SOLN: 10.0000 mg | INTRAMUSCULAR | Status: DC | PRN

## 2015-08-25 MED ORDER — ENOXAPARIN SODIUM 80 MG/0.8ML ~~LOC~~ SOLN
0.5000 mg/kg | SUBCUTANEOUS | Status: DC
  Administered 2015-08-25 – 2015-08-27 (×3): 75 mg via SUBCUTANEOUS
  Filled 2015-08-25 (×3): qty 0.8

## 2015-08-25 MED ORDER — CETYLPYRIDINIUM CHLORIDE 0.05 % MT LIQD
7.0000 mL | Freq: Two times a day (BID) | OROMUCOSAL | Status: DC
  Administered 2015-08-25 – 2015-08-28 (×6): 7 mL via OROMUCOSAL

## 2015-08-25 MED ORDER — FUROSEMIDE 10 MG/ML IJ SOLN: 40.0000 mg | Freq: Two times a day (BID) | INTRAMUSCULAR | Status: DC

## 2015-08-25 NOTE — Progress Notes (Signed)
Patient Demographics:    Janice Morrow, is a 69 y.o. female, DOB - [birth year], [id number]  Admit date - 08/24/2015   Admitting Physician Waldemar Dickens, MD  Outpatient Primary MD for the patient is [name], MD  LOS - 1   Chief Complaint  Patient presents with  . Chest Pain        Subjective:    Janice Morrow today has, No headache, No chest pain, No abdominal pain - No Nausea, No new weakness tingling or numbness, No Cough - Improved SOB.     Assessment  & Plan :     1. Acute on chronic respiratory failure upon admission due to Acute on chronic diastolic CHF EF [id number] on echo a few months ago. Continue salt and fluid restriction, continue IV Lasix, monitor intake and output and daily weights, monitor BMP.  2. URI. Continue Levaquin, monitor cultures. Now afebrile.  3. History of COPD. No wheezing on exam. Stop Solu-Medrol, continue supportive care.  4. OSA. On CPAP at night.  5. Morbid obesity. Follow with PCP outpatient postdischarge.  6. History of seizures on Tegretol continued.     Code Status : Full  Family Communication  : None present  Disposition Plan  : Home 1-2 days  Consults  :    Procedures  :  TTE March 2016  Left ventricle: The cavity size was mildly dilated. Wallthickness was increased in a pattern of mild LVH. Systolic function was normal. The estimated ejection fraction was in the range of 50% to 55%. Doppler parameters are consistent with abnormal left ventricular relaxation (grade 1 diastolicdysfunction). - Mitral valve: Calcified annulus. - Left atrium: The atrium was mildly dilated. - Right atrium: The atrium was mildly dilated.    DVT Prophylaxis  :  Lovenox    Lab Results  Component Value Date   PLT 267 08/25/2015    Inpatient  Medications  Scheduled Meds: . carbamazepine  100 mg Oral BID  . enoxaparin (LOVENOX) injection  40 mg Subcutaneous Q24H  . feeding supplement (ENSURE ENLIVE)  237 mL Oral BID BM  . furosemide  40 mg Intravenous BID  . guaiFENesin  1,200 mg Oral BID  . levofloxacin (LEVAQUIN) IV  750 mg Intravenous Q24H  . lisinopril  2.5 mg Oral Daily  . tiZANidine  2 mg Oral QHS  . Umeclidinium-Vilanterol  1 puff Inhalation q morning - 10a   Continuous Infusions:  PRN Meds:.acetaminophen **OR** [DISCONTINUED] acetaminophen, hydrALAZINE, HYDROcodone-acetaminophen, ketorolac, [DISCONTINUED] ondansetron **OR** ondansetron (ZOFRAN) IV, SUMAtriptan  Antibiotics  :     Anti-infectives    Start     Dose/Rate Route Frequency Ordered Stop   08/24/15 1300  levofloxacin (LEVAQUIN) IVPB 750 mg     750 mg 100 mL/hr over 90 Minutes Intravenous Every 24 hours 08/24/15 1258          Objective:   Filed Vitals:   08/24/15 1602 08/24/15 2116 08/24/15 2123 08/25/15 0554  BP: 145/97 99/51  137/77  Pulse: 77 87 92 61  Temp: 98.7 F (37.1 C) 97.9 F (36.6 C)  97.5 F (36.4 C)  TempSrc: Oral Oral  Oral  Resp: 20 18 18 20   Height:      Weight:    149.097 kg (328  lb 11.2 oz)  SpO2: 92% 97% 92% 92%    Wt Readings from Last 3 Encounters:  08/25/15 149.097 kg (328 lb 11.2 oz)  07/12/15 152.862 kg (337 lb)  06/16/15 152.862 kg (337 lb)     Intake/Output Summary (Last 24 hours) at 08/25/15 1016 Last data filed at 08/25/15 0500  Gross per 24 hour  Intake      0 ml  Output   3800 ml  Net  -3800 ml     Physical Exam  Awake Alert, Oriented X 3, No new F.N deficits, Normal affect [place].AT,PERRAL Supple Neck,No JVD, No cervical lymphadenopathy appriciated.  Symmetrical Chest wall movement, Good air movement bilaterally, CTAB RRR,No Gallops,Rubs or new Murmurs, No Parasternal Heave +ve B.Sounds, Abd Soft, No tenderness, No organomegaly appriciated, No rebound - guarding or rigidity. No Cyanosis, Clubbing,  1+edema, No new Rash or bruise       Data Review:   Micro Results No results found for this or any previous visit (from the past 240 hour(s)).  Radiology Reports Dg Chest 2 View  08/24/2015  CLINICAL DATA:  Cough and phlegm.  LEFT-sided back and chest pain. EXAM: CHEST  2 VIEW COMPARISON:  11/20/2014. FINDINGS: Cardiac enlargement. Interstitial prominence suggesting early edema or diffuse infection. No lobar consolidation. Pulmonary arterial prominence suggesting PAH. No effusion or pneumothorax. No acute osseous findings. IMPRESSION: Cardiomegaly with suspected with pulmonary arterial hypertension. These changes appear worse compared with March 2016. Diffuse interstitial prominence of the lung fields. Early edema or viral infection not excluded. No lobar consolidation. Electronically Signed   By: Staci Righter M.D.   On: 08/24/2015 10:14     CBC  Recent Labs Lab 08/24/15 0924 08/25/15 0517  WBC 10.8* 7.1  HGB 11.9* 12.9  HCT 38.4 42.4  PLT 231 267  MCV 89.5 90.0  MCH 27.7 27.4  MCHC 31.0 30.4  RDW 14.7 14.6    Chemistries   Recent Labs Lab 08/24/15 0924 08/24/15 1100 08/25/15 0517  NA 135  --  138  K 4.3  --  4.7  CL 100*  --  101  CO2 25  --  28  GLUCOSE 128*  --  201*  BUN 13  --  17  CREATININE 1.00  --  1.06*  CALCIUM 9.1  --  9.5  AST  --  16 15  ALT  --  14 14  ALKPHOS  --  78 74  BILITOT  --  1.0 0.4   ------------------------------------------------------------------------------------------------------------------ estimated creatinine clearance is 76.3 mL/min (by C-G formula based on Cr of 1.06). ------------------------------------------------------------------------------------------------------------------ No results for input(s): HGBA1C in the last 72 hours. ------------------------------------------------------------------------------------------------------------------ No results for input(s): CHOL, HDL, LDLCALC, TRIG, CHOLHDL, LDLDIRECT in the  last 72 hours. ------------------------------------------------------------------------------------------------------------------ No results for input(s): TSH, T4TOTAL, T3FREE, THYROIDAB in the last 72 hours.  Invalid input(s): FREET3 ------------------------------------------------------------------------------------------------------------------ No results for input(s): VITAMINB12, FOLATE, FERRITIN, TIBC, IRON, RETICCTPCT in the last 72 hours.  Coagulation profile No results for input(s): INR, PROTIME in the last 168 hours.  No results for input(s): DDIMER in the last 72 hours.  Cardiac Enzymes No results for input(s): CKMB, TROPONINI, MYOGLOBIN in the last 168 hours.  Invalid input(s): CK ------------------------------------------------------------------------------------------------------------------ Invalid input(s): POCBNP   Time Spent in minutes 35   SINGH,PRASHANT K M.D on 08/25/2015 at 10:16 AM  Between 7am to 7pm - Pager - [phone]  After 7pm go to [email or web address] - password [facility]  [facility] -  Office  [phone]

## 2015-08-25 NOTE — Care Management Note (Signed)
Case Management Note  Patient Details  Name: Janice Morrow MRN: [id number] Date of Birth: 04-30-1946  Subjective/Objective:                  Date-08-25-15 Initial Assessment Spoke with patient at the bedside.  Introduced self as Tourist information centre manager and explained role in discharge planning and how to be reached.  Verified patient lives with brother who takes her to MD appointments, cooks for her and does errands for her.  Verified patient anticipates to go home with brother at time of discharge and will have part-time supervision by brother at this time to best of their knowledge.  Patient has DME cane, and CPAP through [facility] that she states she wears at night and during the day as recommended by her neurologist. Expressed potential need for no other DME.  Patient denied needing help with their medication.  Patient is driven by brother to MD appointments.  Verified patient has PCP Ronnald Ramp. Patient states they currently receive [place] services through no one. Patient refused to have any HH services because she states that she cannot consent to have anyone in her brother's house. CM asked patient if she were to explain to her brother that it is in her best to have a nurse come to the house if he would allow that, she replied that she did not want any [place] services.    Plan: CM will continue to follow for discharge planning and [facility] resources.   Carles Collet RN BSN CM [phone]   Action/Plan:  Patient refusing [place] services at this time, no other CM resources needed at this time.  Expected Discharge Date:                  Expected Discharge Plan:  Home/Self Care  In-House Referral:     Discharge planning Services  CM Consult  Post Acute Care Choice:    Choice offered to:     DME Arranged:    DME Agency:     HH Arranged:    [place] Agency:     Status of Service:  Completed, signed off  Medicare Important Message Given:    Date Medicare IM Given:    Medicare IM give by:    Date Additional  Medicare IM Given:    Additional Medicare Important Message give by:     If discussed at [place] of Stay Meetings, dates discussed:    Additional Comments:  Carles Collet, RN 08/25/2015, 4:14 PM

## 2015-08-25 NOTE — Progress Notes (Signed)
Nutrition Brief Note  Patient identified on the Malnutrition Screening Tool (MST) Report  Wt Readings from Last 15 Encounters:  08/25/15 328 lb 11.2 oz (149.097 kg)  07/12/15 337 lb (152.862 kg)  06/16/15 337 lb (152.862 kg)  05/18/15 337 lb 11.2 oz (153.18 kg)  04/14/15 330 lb (149.687 kg)  04/11/15 330 lb (149.687 kg)  03/08/15 338 lb (153.316 kg)  01/13/15 331 lb (150.141 kg)  11/25/14 334 lb 4 oz (151.615 kg)  11/20/14 332 lb 3.2 oz (150.685 kg)  10/30/14 329 lb 9.4 oz (149.5 kg)  08/22/14 334 lb 3.5 oz (151.6 kg)  04/27/14 352 lb (159.666 kg)  03/03/14 362 lb 3.2 oz (164.293 kg)  06/17/13 331 lb (150.141 kg)   Amberlie Cockrill is a 69 year old female who presents with shortness of breath. Gradual onset. Started 1 week ago. Constant. Getting worse. Associated with intermittent productive cough and subjective fevers. He wears CPAP when sleeping. He does not have any rescue inhalers. Associated with mild diffuse chest pain when patient coughs or takes a deep breath but otherwise no chest pain. Worse with ambulation. Improves with rest. At baseline patient is able to sleep lying down but over the last week has had to sleep sitting in a recliner. Additionally patient endorses 3 weeks of lower extremity edema  Pt admitted with COPD exacerbation.   Spoke with pt at bedside, who reports decline in appetite over the past 2 weeks. She reports she ate her breakfast well, but ate poorly at dinner because "it was the worst salad I ever had". She complains of taste changes, which have also caused her to not eat as well as she normally does.   Pt endorses weight loss due to poor appetite and taste changes, stating she has lost 21# within the past 2 weeks. However, this is not consistent with weight hx. Pt reports desire to lose weight and reports she has been watching her portion sizes to assist with this.   Nutrition-Focused physical exam completed. Findings are no fat depletion, no muscle depletion,  and moderate edema.   Pt requests that Ensure Enlive supplements be continued due to her appetite not having returned yet (despite consuming 100% of meals). Also reviewed Heart Healthy diet principles and menu selections available to pt, so she could choose menu items that better suited her food preferences.   Body mass index is 54.7 kg/(m^2). Patient meets criteria for extreme obesity, class III based on current BMI.   Current diet order is Heart Healthy, patient is consuming approximately 100% of meals at this time. Labs and medications reviewed.   No nutrition interventions warranted at this time. If nutrition issues arise, please consult RD.   Kathelene Rumberger A. Jimmye Norman, RD, LDN, CDE Pager: [phone] After hours Pager: [phone]

## 2015-08-25 NOTE — Progress Notes (Signed)
Pt c/o chest pain and headache after she walked to the bathroom MD on call Enriqueta Shutter was paged about pt complains EGK done showing  sinus rythm with premature supraventricular rhythm MD paged about EKG RESULT waiting for her response will continue to monitor BP 156/80 P 96 O2 90

## 2015-08-25 NOTE — Progress Notes (Addendum)
Patient complaining of shortness of breath. Sp02 85% on room air at rest. Patient placed on 2L [place] and Sp02 90%. Respiratory Therapy contacted to come assess patient because patient stated she has these episodes at home and puts on her CPAP. RT places CPAP on patients. Patient placed on continuous pulse ox.

## 2015-08-25 NOTE — Progress Notes (Signed)
Patient Saturations on Room Air at Rest = 92%  Patient Saturations on [employer] while Ambulating = 90  Patient tolerated ambulation fair with some complaints of dyspnea on exertion. Patient states she feels better at rest.

## 2015-08-25 NOTE — Evaluation (Signed)
Physical Therapy Evaluation Patient Details Name: Janice Morrow MRN: [id number] DOB: 06-25-1946 Today's Date: 08/25/2015   History of Present Illness  Patient admitted wiht COPD exacerbation.  PMH significant for obesity, chronic back pain, CHF, essential HTN, sz, migraines, ARF and obesity hypoventilation syndrome and OSA on CPAP    Clinical Impression  Patient admitted with COPD exacerbation and presents with mild dependencies in mobility and independence due to increase SOB with activity and mildly decreased balance.  Feel patient will benefit from continued PT to progress mobility back to baseline for return home.      Follow Up Recommendations No PT follow up;Supervision - Intermittent    Equipment Recommendations  None recommended by PT    Recommendations for Other Services       Precautions / Restrictions Precautions Precautions: Fall      Mobility  Bed Mobility Overal bed mobility: Modified Independent             General bed mobility comments: used railing, HOB raised  Transfers Overall transfer level: Needs assistance Equipment used: Rolling walker (2 wheeled) Transfers: Sit to/from Stand Sit to Stand: Min guard            Ambulation/Gait Ambulation/Gait assistance: Min guard Ambulation Distance (Feet): 20 Feet Assistive device: Rolling walker (2 wheeled) Gait Pattern/deviations: Step-through pattern;Wide base of support;Trunk flexed Gait velocity: decreased   General Gait Details: patient SOB after ambulating 15-20 feet, needed to sit down.    Stairs            Wheelchair Mobility    Modified Rankin (Stroke Patients Only)       Balance Overall balance assessment: Needs assistance Sitting-balance support: No upper extremity supported Sitting balance-Leahy Scale: Good     Standing balance support: Bilateral upper extremity supported Standing balance-Leahy Scale: Poor Standing balance comment: required RW for balance                              Pertinent Vitals/Pain Pain Assessment: No/denies pain    Home Living Family/patient expects to be discharged to:: Private residence Living Arrangements: Other relatives (brother) Available Help at Discharge: Family Type of Home: Apartment Home Access: Level entry     Home Layout: One level Home Equipment: Cane - single point      Prior Function Level of Independence: Independent with assistive device(s)         Comments: cane for mobility     Hand Dominance        Extremity/Trunk Assessment   Upper Extremity Assessment: Generalized weakness           Lower Extremity Assessment: Generalized weakness      Cervical / Trunk Assessment: Normal  Communication   Communication: No difficulties  Cognition Arousal/Alertness: Awake/alert Behavior During Therapy: WFL for tasks assessed/performed Overall Cognitive Status: Within Functional Limits for tasks assessed                      General Comments      Exercises        Assessment/Plan    PT Assessment Patient needs continued PT services  PT Diagnosis Generalized weakness   PT Problem List Decreased activity tolerance;Decreased balance;Decreased mobility;Cardiopulmonary status limiting activity  PT Treatment Interventions Gait training;Functional mobility training;Therapeutic activities;Therapeutic exercise;Balance training;Patient/family education   PT Goals (Current goals can be found in the Care Plan section) Acute Rehab PT Goals Patient Stated Goal: get my breathing back  PT Goal Formulation: With patient Time For Goal Achievement: 09/01/15 Potential to Achieve Goals: Good    Frequency Min 3X/week   Barriers to discharge        Co-evaluation               End of Session Equipment Utilized During Treatment: Oxygen Activity Tolerance: Treatment limited secondary to medical complications (Comment) (increased SOB) Patient left: in bed;with call  bell/phone within reach;with nursing/sitter in room Nurse Communication: Mobility status         Time: [id number] PT Time Calculation (min) (ACUTE ONLY): 11 min   Charges:   PT Evaluation $Initial PT Evaluation Tier I: 1 Procedure     PT G CodesShanna Cisco 08/25/2015, 3:41 PM  08/25/2015 [place], PT [phone]

## 2015-08-26 LAB — BASIC METABOLIC PANEL
ANION GAP: 12 (ref 5–15)
BUN: 22 mg/dL — AB (ref 6–20)
CHLORIDE: 99 mmol/L — AB (ref 101–111)
CO2: 26 mmol/L (ref 22–32)
Calcium: 9.7 mg/dL (ref 8.9–10.3)
Creatinine, Ser: 0.98 mg/dL (ref 0.44–1.00)
GFR calc Af Amer: >60 mL/min (ref >60)
GFR, EST NON AFRICAN AMERICAN: 58 mL/min — AB (ref >60)
GLUCOSE: 104 mg/dL — AB (ref 65–99)
POTASSIUM: 5.1 mmol/L (ref 3.5–5.1)
SODIUM: 137 mmol/L (ref 135–145)

## 2015-08-26 LAB — TROPONIN I: Troponin I: <0.03 ng/mL (ref <0.031)

## 2015-08-26 LAB — MAGNESIUM: MAGNESIUM: 2.3 mg/dL (ref 1.7–2.4)

## 2015-08-26 MED ORDER — LEVOFLOXACIN 750 MG PO TABS
750.0000 mg | ORAL_TABLET | ORAL | Status: AC
  Administered 2015-08-26 – 2015-08-28 (×3): 750 mg via ORAL
  Filled 2015-08-26 (×3): qty 1

## 2015-08-26 MED ORDER — DOCUSATE SODIUM 100 MG PO CAPS
200.0000 mg | ORAL_CAPSULE | Freq: Two times a day (BID) | ORAL | Status: DC
  Administered 2015-08-26 – 2015-08-28 (×2): 200 mg via ORAL
  Filled 2015-08-26 (×4): qty 2

## 2015-08-26 MED ORDER — ZOLPIDEM TARTRATE 5 MG PO TABS
5.0000 mg | ORAL_TABLET | Freq: Every evening | ORAL | Status: DC | PRN
  Administered 2015-08-26 – 2015-08-27 (×2): 5 mg via ORAL
  Filled 2015-08-26 (×2): qty 1

## 2015-08-26 MED ORDER — SODIUM POLYSTYRENE SULFONATE 15 GM/60ML PO SUSP
30.0000 g | Freq: Once | ORAL | Status: AC
  Administered 2015-08-26: 30 g via ORAL
  Filled 2015-08-26: qty 120

## 2015-08-26 MED ORDER — BISACODYL 5 MG PO TBEC
10.0000 mg | DELAYED_RELEASE_TABLET | Freq: Every day | ORAL | Status: DC
  Administered 2015-08-28: 10 mg via ORAL
  Filled 2015-08-26 (×2): qty 2

## 2015-08-26 NOTE — Progress Notes (Signed)
Patient Demographics:    Janice Morrow, is a 70 y.o. female, DOB - December 13, 1945, [id number]  Admit date - 08/24/2015   Admitting Physician Waldemar Dickens, MD  Outpatient Primary MD for the patient is [name], MD  LOS - 2   Chief Complaint  Patient presents with  . Chest Pain        Subjective:    Janice Morrow today has, No headache, No chest pain, No abdominal pain - No Nausea, No new weakness tingling or numbness, No Cough - Improved SOB.     Assessment  & Plan :     1. Acute on chronic respiratory failure upon admission due to Acute on chronic diastolic CHF EF [id number] on echo a few months ago. Continue salt and fluid restriction, continue IV Lasix, monitor intake and output and daily weights, monitor BMP. Overall much improved, had nonspecific chest pain last night with stable EKG symptom free now. Trend troponin.  2. URI. Continue Levaquin PO, monitor cultures. Now afebrile.  3. History of COPD. No wheezing on exam. Stopped Solu-Medrol, continue supportive care.  4. OSA. On CPAP at night.  5. Morbid obesity. Follow with PCP outpatient postdischarge.  6. History of seizures on Tegretol continued.     Code Status : Full  Family Communication  : None present  Disposition Plan  : Home 1-2 days  Consults  :    Procedures  :  TTE March 2016  Left ventricle: The cavity size was mildly dilated. Wallthickness was increased in a pattern of mild LVH. Systolic function was normal. The estimated ejection fraction was in therange of 50% to 55%. Doppler parameters are consistent with abnormal left ventricular relaxation (grade 1 diastolicdysfunction). - Mitral valve: Calcified annulus. - Left atrium: The atrium was mildly dilated. - Right atrium: The atrium was mildly  dilated.    DVT Prophylaxis  :  Lovenox    Lab Results  Component Value Date   PLT 267 08/25/2015    Inpatient Medications  Scheduled Meds: . antiseptic oral rinse  7 mL Mouth Rinse BID  . bisacodyl  10 mg Oral Daily  . carbamazepine  100 mg Oral BID  . docusate sodium  200 mg Oral BID  . enoxaparin (LOVENOX) injection  0.5 mg/kg Subcutaneous Q24H  . feeding supplement (ENSURE ENLIVE)  237 mL Oral BID BM  . furosemide  40 mg Intravenous Daily  . guaiFENesin  1,200 mg Oral BID  . levofloxacin  750 mg Oral Q24H  . sodium polystyrene  30 g Oral Once  . tiZANidine  2 mg Oral QHS  . Umeclidinium-Vilanterol  1 puff Inhalation q morning - 10a   Continuous Infusions:  PRN Meds:.acetaminophen **OR** [DISCONTINUED] acetaminophen, hydrALAZINE, HYDROcodone-acetaminophen, [DISCONTINUED] ondansetron **OR** ondansetron (ZOFRAN) IV, SUMAtriptan  Antibiotics  :     Anti-infectives    Start     Dose/Rate Route Frequency Ordered Stop   08/26/15 1200  levofloxacin (LEVAQUIN) tablet 750 mg     750 mg Oral Every 24 hours 08/26/15 0955 08/28/15 2359   08/24/15 1300  levofloxacin (LEVAQUIN) IVPB 750 mg  Status:  Discontinued     750 mg 100 mL/hr over 90 Minutes Intravenous Every 24 hours 08/24/15 1258 08/26/15 0955  Objective:   Filed Vitals:   08/25/15 1402 08/25/15 2116 08/26/15 0446 08/26/15 0447  BP: 113/63 149/85  112/76  Pulse: 98 79  80  Temp: 97.8 F (36.6 C) 98.2 F (36.8 C)  98.2 F (36.8 C)  TempSrc: Oral Oral  Oral  Resp: 24 20  20   Height:      Weight:   144.7 kg (319 lb 0.1 oz)   SpO2: 90% 92%  96%    Wt Readings from Last 3 Encounters:  08/26/15 144.7 kg (319 lb 0.1 oz)  07/12/15 152.862 kg (337 lb)  06/16/15 152.862 kg (337 lb)     Intake/Output Summary (Last 24 hours) at 08/26/15 1117 Last data filed at 08/26/15 1020  Gross per 24 hour  Intake    848 ml  Output   1125 ml  Net   -277 ml     Physical Exam  Awake Alert, Oriented X 3, No new F.N  deficits, Normal affect [place].AT,PERRAL Supple Neck,No JVD, No cervical lymphadenopathy appriciated.  Symmetrical Chest wall movement, Good air movement bilaterally, CTAB RRR,No Gallops,Rubs or new Murmurs, No Parasternal Heave +ve B.Sounds, Abd Soft, No tenderness, No organomegaly appriciated, No rebound - guarding or rigidity. No Cyanosis, Clubbing, 1+edema, No new Rash or bruise       Data Review:   Micro Results No results found for this or any previous visit (from the past 240 hour(s)).  Radiology Reports Dg Chest 2 View  08/24/2015  CLINICAL DATA:  Cough and phlegm.  LEFT-sided back and chest pain. EXAM: CHEST  2 VIEW COMPARISON:  11/20/2014. FINDINGS: Cardiac enlargement. Interstitial prominence suggesting early edema or diffuse infection. No lobar consolidation. Pulmonary arterial prominence suggesting PAH. No effusion or pneumothorax. No acute osseous findings. IMPRESSION: Cardiomegaly with suspected with pulmonary arterial hypertension. These changes appear worse compared with March 2016. Diffuse interstitial prominence of the lung fields. Early edema or viral infection not excluded. No lobar consolidation. Electronically Signed   By: Staci Righter M.D.   On: 08/24/2015 10:14     CBC  Recent Labs Lab 08/24/15 0924 08/25/15 0517  WBC 10.8* 7.1  HGB 11.9* 12.9  HCT 38.4 42.4  PLT 231 267  MCV 89.5 90.0  MCH 27.7 27.4  MCHC 31.0 30.4  RDW 14.7 14.6    Chemistries   Recent Labs Lab 08/24/15 0924 08/24/15 1100 08/25/15 0517 08/26/15 0845  NA 135  --  138 137  K 4.3  --  4.7 5.1  CL 100*  --  101 99*  CO2 25  --  28 26  GLUCOSE 128*  --  201* 104*  BUN 13  --  17 22*  CREATININE 1.00  --  1.06* 0.98  CALCIUM 9.1  --  9.5 9.7  MG  --   --   --  2.3  AST  --  16 15  --   ALT  --  14 14  --   ALKPHOS  --  78 74  --   BILITOT  --  1.0 0.4  --     ------------------------------------------------------------------------------------------------------------------ estimated creatinine clearance is 81 mL/min (by C-G formula based on Cr of 0.98). ------------------------------------------------------------------------------------------------------------------ No results for input(s): HGBA1C in the last 72 hours. ------------------------------------------------------------------------------------------------------------------ No results for input(s): CHOL, HDL, LDLCALC, TRIG, CHOLHDL, LDLDIRECT in the last 72 hours. ------------------------------------------------------------------------------------------------------------------ No results for input(s): TSH, T4TOTAL, T3FREE, THYROIDAB in the last 72 hours.  Invalid input(s): FREET3 ------------------------------------------------------------------------------------------------------------------ No results for input(s): VITAMINB12, FOLATE, FERRITIN, TIBC, IRON, RETICCTPCT in  the last 72 hours.  Coagulation profile No results for input(s): INR, PROTIME in the last 168 hours.  No results for input(s): DDIMER in the last 72 hours.  Cardiac Enzymes  Recent Labs Lab 08/25/15 2112  TROPONINI <0.03   ------------------------------------------------------------------------------------------------------------------ Invalid input(s): POCBNP   Time Spent in minutes 35   Janice Morrow K M.D on 08/26/2015 at 11:17 AM  Between 7am to 7pm - Pager - [phone]  After 7pm go to [email or web address] - password [facility]  [facility] -  Office  [phone]

## 2015-08-26 NOTE — Progress Notes (Signed)
Physical Therapy Treatment Patient Details Name: Janice Morrow MRN: [id number] DOB: 1946-06-23 Today's Date: 08/26/2015    History of Present Illness Patient admitted wiht COPD exacerbation.  PMH significant for obesity, chronic back pain, CHF, essential HTN, sz, migraines, ARF and obesity hypoventilation syndrome and OSA on CPAP      PT Comments    Pt limited by fatigue and c/o dizziness, but able to ambulate 25' in room with RW. O2 dropped to 84% on RA and had been 92% on RA at rest. O2 re-applied.  Follow Up Recommendations  No PT follow up;Supervision - Intermittent (pt declining HH services)     Equipment Recommendations  None recommended by PT    Recommendations for Other Services       Precautions / Restrictions Precautions Precautions: Fall Restrictions Weight Bearing Restrictions: No    Mobility  Bed Mobility Overal bed mobility: Modified Independent             General bed mobility comments: used railing, HOB raised  Transfers Overall transfer level: Needs assistance Equipment used: Rolling walker (2 wheeled) Transfers: Sit to/from Stand Sit to Stand: Min guard         General transfer comment: min/guard due to dizziness  Ambulation/Gait Ambulation/Gait assistance: Min guard Ambulation Distance (Feet): 25 Feet Assistive device: Rolling walker (2 wheeled) Gait Pattern/deviations: Step-through pattern;Trunk flexed Gait velocity: decreased   General Gait Details: Pt ambulated in room and then had to sit in standard chair due to fatigue and dizziness.  O2 prior to gait on RA 92%, once sitting 84%.  Pt went back to supine and re-applied o2.  Lab in to see patient   Stairs            Wheelchair Mobility    Modified Rankin (Stroke Patients Only)       Balance Overall balance assessment: Needs assistance Sitting-balance support: Feet supported Sitting balance-Leahy Scale: Good     Standing balance support: Bilateral upper extremity  supported Standing balance-Leahy Scale: Poor Standing balance comment: required UE support                    Cognition Arousal/Alertness: Awake/alert Behavior During Therapy: WFL for tasks assessed/performed Overall Cognitive Status: Within Functional Limits for tasks assessed                      Exercises      General Comments General comments (skin integrity, edema, etc.): Pt without o2 on upon arrival with sat 92%.  Pt refusing use of gait belt.      Pertinent Vitals/Pain Pain Assessment: No/denies pain    Home Living                      Prior Function            PT Goals (current goals can now be found in the care plan section) Acute Rehab PT Goals Patient Stated Goal: get my breathing back PT Goal Formulation: With patient Time For Goal Achievement: 09/01/15 Potential to Achieve Goals: Good Progress towards PT goals: Progressing toward goals    Frequency  Min 3X/week    PT Plan      Co-evaluation             End of Session Equipment Utilized During Treatment: Oxygen (re-applied at end, refused gait belt) Activity Tolerance: Treatment limited secondary to medical complications (Comment) (c/o dizziness/fatigue) Patient left: in bed;with call bell/phone within reach;Other (comment)  Time: 1133-1150 PT Time Calculation (min) (ACUTE ONLY): 17 min  Charges:  $Gait Training: 8-22 mins                    G Codes:      Daejon Lich LUBECK 08/26/2015, 12:18 PM

## 2015-08-26 NOTE — Progress Notes (Signed)
md called back with an order to draw blood for troponin level chest gone down  will continue to monitor

## 2015-08-27 MED ORDER — METOPROLOL TARTRATE 25 MG PO TABS
25.0000 mg | ORAL_TABLET | Freq: Two times a day (BID) | ORAL | Status: DC
  Administered 2015-08-27 (×2): 25 mg via ORAL
  Filled 2015-08-27 (×3): qty 1

## 2015-08-27 MED ORDER — NITROGLYCERIN 0.4 MG SL SUBL: 0.4000 mg | SUBLINGUAL_TABLET | SUBLINGUAL | Status: DC | PRN

## 2015-08-27 MED ORDER — GI COCKTAIL ~~LOC~~
30.0000 mL | Freq: Three times a day (TID) | ORAL | Status: DC
  Administered 2015-08-27: 30 mL via ORAL
  Filled 2015-08-27 (×4): qty 30

## 2015-08-27 MED ORDER — FUROSEMIDE 10 MG/ML IJ SOLN
40.0000 mg | Freq: Two times a day (BID) | INTRAMUSCULAR | Status: DC
  Administered 2015-08-27: 40 mg via INTRAVENOUS
  Filled 2015-08-27: qty 4

## 2015-08-27 MED ORDER — METOPROLOL TARTRATE 1 MG/ML IV SOLN: 5.0000 mg | INTRAVENOUS | Status: DC | PRN

## 2015-08-27 NOTE — Progress Notes (Signed)
Patient Demographics:    Janice Morrow, is a 69 y.o. female, DOB - 1945-12-08, [id number]  Admit date - 08/24/2015   Admitting Physician Waldemar Dickens, MD  Outpatient Primary MD for the patient is [name], MD  LOS - 3   Chief Complaint  Patient presents with  . Chest Pain        Subjective:    Janice Morrow today has, No headache, No chest pain, No abdominal pain - No Nausea, No new weakness tingling or numbness, No Cough - Improved SOB.     Assessment  & Plan :     1. Acute on chronic respiratory failure upon admission due to Acute on chronic diastolic CHF EF [id number] on echo a few months ago. Continue salt and fluid restriction, continue IV Lasix, monitor intake and output and daily weights, monitor BMP. Overall much improved, had nonspecific chest pain last night with stable EKG symptom free now troponin 3 sets negative, she now says her pain is coming from heartburn and GI cocktail helps. We will add GI cocktail and monitor. Likely discharge in the morning.  2. URI. Continue Levaquin PO, monitor cultures. Now afebrile.  3. History of COPD. No wheezing on exam. Stopped Solu-Medrol, continue supportive care.  4. OSA. On CPAP at night.  5. Morbid obesity. Follow with PCP outpatient postdischarge.  6. History of seizures on Tegretol continued.     Code Status : Full  Family Communication  : None present  Disposition Plan  : Home in the morning  Consults  :    Procedures  :  TTE March 2016  Left ventricle: The cavity size was mildly dilated. Wallthickness was increased in a pattern of mild LVH. Systolic function was normal. The estimated ejection fraction was in therange of 50% to 55%. Doppler parameters are consistent with abnormal left ventricular relaxation (grade 1  diastolicdysfunction). - Mitral valve: Calcified annulus. - Left atrium: The atrium was mildly dilated. - Right atrium: The atrium was mildly dilated.    DVT Prophylaxis  :  Lovenox    Lab Results  Component Value Date   PLT 267 08/25/2015    Inpatient Medications  Scheduled Meds: . antiseptic oral rinse  7 mL Mouth Rinse BID  . bisacodyl  10 mg Oral Daily  . carbamazepine  100 mg Oral BID  . docusate sodium  200 mg Oral BID  . enoxaparin (LOVENOX) injection  0.5 mg/kg Subcutaneous Q24H  . feeding supplement (ENSURE ENLIVE)  237 mL Oral BID BM  . furosemide  40 mg Intravenous Q12H  . gi cocktail  30 mL Oral TID  . guaiFENesin  1,200 mg Oral BID  . levofloxacin  750 mg Oral Q24H  . tiZANidine  2 mg Oral QHS  . Umeclidinium-Vilanterol  1 puff Inhalation q morning - 10a   Continuous Infusions:  PRN Meds:.acetaminophen **OR** [DISCONTINUED] acetaminophen, hydrALAZINE, HYDROcodone-acetaminophen, nitroGLYCERIN, [DISCONTINUED] ondansetron **OR** ondansetron (ZOFRAN) IV, SUMAtriptan, zolpidem  Antibiotics  :     Anti-infectives    Start     Dose/Rate Route Frequency Ordered Stop   08/26/15 1200  levofloxacin (LEVAQUIN) tablet 750 mg     750 mg Oral Every 24 hours 08/26/15 0955 08/28/15 2359   08/24/15 1300  levofloxacin (LEVAQUIN)  IVPB 750 mg  Status:  Discontinued     750 mg 100 mL/hr over 90 Minutes Intravenous Every 24 hours 08/24/15 1258 08/26/15 0955        Objective:   Filed Vitals:   08/26/15 0447 08/26/15 1321 08/26/15 2058 08/27/15 0520  BP: 112/76 108/92 138/58 112/72  Pulse: 80 97 94 90  Temp: 98.2 F (36.8 C) 99.4 F (37.4 C) 98.1 F (36.7 C) 98.5 F (36.9 C)  TempSrc: Oral Oral Oral Oral  Resp: 20 20 18 18   Height:      Weight:    149.7 kg (330 lb 0.5 oz)  SpO2: 96% 94% 94% 91%    Wt Readings from Last 3 Encounters:  08/27/15 149.7 kg (330 lb 0.5 oz)  07/12/15 152.862 kg (337 lb)  06/16/15 152.862 kg (337 lb)     Intake/Output Summary (Last  24 hours) at 08/27/15 0957 Last data filed at 08/27/15 0536  Gross per 24 hour  Intake    836 ml  Output   1010 ml  Net   -174 ml   Physical Exam  Awake Alert, Oriented X 3, No new F.N deficits, Normal affect [place].AT,PERRAL Supple Neck,No JVD, No cervical lymphadenopathy appriciated.  Symmetrical Chest wall movement, Good air movement bilaterally, few rales RRR,No Gallops,Rubs or new Murmurs, No Parasternal Heave +ve B.Sounds, Abd Soft, No tenderness, No organomegaly appriciated, No rebound - guarding or rigidity. No Cyanosis, Clubbing, 1+edema, No new Rash or bruise       Data Review:   Micro Results No results found for this or any previous visit (from the past 240 hour(s)).  Radiology Reports Dg Chest 2 View  08/24/2015  CLINICAL DATA:  Cough and phlegm.  LEFT-sided back and chest pain. EXAM: CHEST  2 VIEW COMPARISON:  11/20/2014. FINDINGS: Cardiac enlargement. Interstitial prominence suggesting early edema or diffuse infection. No lobar consolidation. Pulmonary arterial prominence suggesting PAH. No effusion or pneumothorax. No acute osseous findings. IMPRESSION: Cardiomegaly with suspected with pulmonary arterial hypertension. These changes appear worse compared with March 2016. Diffuse interstitial prominence of the lung fields. Early edema or viral infection not excluded. No lobar consolidation. Electronically Signed   By: Staci Righter M.D.   On: 08/24/2015 10:14     CBC  Recent Labs Lab 08/24/15 0924 08/25/15 0517  WBC 10.8* 7.1  HGB 11.9* 12.9  HCT 38.4 42.4  PLT 231 267  MCV 89.5 90.0  MCH 27.7 27.4  MCHC 31.0 30.4  RDW 14.7 14.6    Chemistries   Recent Labs Lab 08/24/15 0924 08/24/15 1100 08/25/15 0517 08/26/15 0845  NA 135  --  138 137  K 4.3  --  4.7 5.1  CL 100*  --  101 99*  CO2 25  --  28 26  GLUCOSE 128*  --  201* 104*  BUN 13  --  17 22*  CREATININE 1.00  --  1.06* 0.98  CALCIUM 9.1  --  9.5 9.7  MG  --   --   --  2.3  AST  --  16 15  --    ALT  --  14 14  --   ALKPHOS  --  78 74  --   BILITOT  --  1.0 0.4  --    ------------------------------------------------------------------------------------------------------------------ estimated creatinine clearance is 82.8 mL/min (by C-G formula based on Cr of 0.98). ------------------------------------------------------------------------------------------------------------------ No results for input(s): HGBA1C in the last 72 hours. ------------------------------------------------------------------------------------------------------------------ No results for input(s): CHOL, HDL, LDLCALC, TRIG, CHOLHDL, LDLDIRECT  in the last 72 hours. ------------------------------------------------------------------------------------------------------------------ No results for input(s): TSH, T4TOTAL, T3FREE, THYROIDAB in the last 72 hours.  Invalid input(s): FREET3 ------------------------------------------------------------------------------------------------------------------ No results for input(s): VITAMINB12, FOLATE, FERRITIN, TIBC, IRON, RETICCTPCT in the last 72 hours.  Coagulation profile No results for input(s): INR, PROTIME in the last 168 hours.  No results for input(s): DDIMER in the last 72 hours.  Cardiac Enzymes  Recent Labs Lab 08/25/15 2112 08/26/15 1153  TROPONINI <0.03 <0.03   ------------------------------------------------------------------------------------------------------------------ Invalid input(s): POCBNP   Time Spent in minutes 35   Doyce Saling K M.D on 08/27/2015 at 9:57 AM  Between 7am to 7pm - Pager - [phone]  After 7pm go to [email or web address] - password [facility]  [facility] -  Office  [phone]

## 2015-08-28 LAB — BASIC METABOLIC PANEL
Anion gap: 8 (ref 5–15)
BUN: 22 mg/dL — AB (ref 6–20)
CALCIUM: 9.6 mg/dL (ref 8.9–10.3)
CO2: 34 mmol/L — ABNORMAL HIGH (ref 22–32)
Chloride: 96 mmol/L — ABNORMAL LOW (ref 101–111)
Creatinine, Ser: 1.2 mg/dL — ABNORMAL HIGH (ref 0.44–1.00)
GFR calc Af Amer: 53 mL/min — ABNORMAL LOW (ref >60)
GFR, EST NON AFRICAN AMERICAN: 46 mL/min — AB (ref >60)
Glucose, Bld: 128 mg/dL — ABNORMAL HIGH (ref 65–99)
POTASSIUM: 4.2 mmol/L (ref 3.5–5.1)
SODIUM: 138 mmol/L (ref 135–145)

## 2015-08-28 MED ORDER — METOPROLOL TARTRATE 25 MG PO TABS: 25.0000 mg | ORAL_TABLET | Freq: Two times a day (BID) | ORAL | Status: DC

## 2015-08-28 MED ORDER — PANTOPRAZOLE SODIUM 40 MG PO TBEC: 40.0000 mg | DELAYED_RELEASE_TABLET | Freq: Every day | ORAL | Status: DC

## 2015-08-28 MED ORDER — ASPIRIN EC 81 MG PO TBEC: 81.0000 mg | DELAYED_RELEASE_TABLET | Freq: Every day | ORAL | Status: DC

## 2015-08-28 MED ORDER — FUROSEMIDE 40 MG PO TABS: 40.0000 mg | ORAL_TABLET | Freq: Every day | ORAL | Status: DC

## 2015-08-28 NOTE — Progress Notes (Signed)
Pt refused CPAP for tonight. Stated she would call if she changed her mind.  

## 2015-08-28 NOTE — Discharge Instructions (Signed)
Follow with Primary MD [name], MD in 3-4 days   Get CBC, CMP, 2 view Chest X ray checked  by Primary MD next visit.    Activity: As tolerated with Full fall precautions use walker/cane & assistance as needed   Disposition Home     Diet:   Heart Healthy Check your Weight same time everyday, if you gain over 2 pounds, or you develop in leg swelling, experience more shortness of breath or chest pain, call your Primary MD immediately. Follow Cardiac Low Salt Diet and 1.5 lit/day fluid restriction.   On your next visit with your primary care physician please Get Medicines reviewed and adjusted.   Please request your Prim.MD to go over all Hospital Tests and Procedure/Radiological results at the follow up, please get all Hospital records sent to your Prim MD by signing hospital release before you go home.   If you experience worsening of your admission symptoms, develop shortness of breath, life threatening emergency, suicidal or homicidal thoughts you must seek medical attention immediately by calling 911 or calling your MD immediately  if symptoms less severe.  You Must read complete instructions/literature along with all the possible adverse reactions/side effects for all the Medicines you take and that have been prescribed to you. Take any new Medicines after you have completely understood and accpet all the possible adverse reactions/side effects.   Do not drive, operating heavy machinery, perform activities at heights, swimming or participation in water activities or provide baby sitting services if your were admitted for syncope or siezures until you have seen by Primary MD or a Neurologist and advised to do so again.  Do not drive when taking Pain medications.    Do not take more than prescribed Pain, Sleep and Anxiety Medications  Special Instructions: If you have smoked or chewed Tobacco  in the last 2 yrs please stop smoking, stop any regular Alcohol  and or any Recreational  drug use.  Wear Seat belts while driving.   Please note  You were cared for by a hospitalist during your hospital stay. If you have any questions about your discharge medications or the care you received while you were in the hospital after you are discharged, you can call the unit and asked to speak with the hospitalist on call if the hospitalist that took care of you is not available. Once you are discharged, your primary care physician will handle any further medical issues. Please note that NO REFILLS for any discharge medications will be authorized once you are discharged, as it is imperative that you return to your primary care physician (or establish a relationship with a primary care physician if you do not have one) for your aftercare needs so that they can reassess your need for medications and monitor your lab values.

## 2015-08-28 NOTE — Discharge Summary (Signed)
Janice Morrow, is a 70 y.o. female  DOB 08-18-1946  MRN [id number].  Admission date:  08/24/2015  Admitting Physician  Waldemar Dickens, MD  Discharge Date:  08/28/2015   Primary MD  [name], MD  Recommendations for primary care physician for things to follow:   Check BMP, weight and diuretic dose in 3-4 days. Outpatient cardiology and GI follow-up.   Admission Diagnosis  COPD exacerbation ([place]) [J44.1]   Discharge Diagnosis  COPD exacerbation ([place]) [J44.1]    Principal Problem:   COPD exacerbation ([place]) Active Problems:   Obesity   Chronic back pain   OSA on CPAP   Acute diastolic CHF (congestive heart failure) (H[facility])   Essential hypertension   Seizures ([facility])   Migraines   Acute respiratory failure ([facility])   Obesity hypoventilation syndrome ([facility])      Past Medical History  Diagnosis Date  . Heart murmur   . Headache(784.0)   . COPD (chronic obstructive pulmonary disease) ([place])   . GERD (gastroesophageal reflux disease)   . PVD (peripheral vascular disease) ([place])   . Hyperlipidemia   . Cervicalgia   . Migraine without aura, with intractable migraine, so stated, without mention of status migrainosus   . PVD (peripheral vascular disease) ([place])   . Osteoarthritis   . Chronic back pain   . Trigeminal neuralgia   . Shortness of breath dyspnea   . Seizures ([place]) 07/2014, 10/2014  . Acute diastolic CHF (congestive heart failure) ([place]) 08/24/2015  . Essential hypertension 08/24/2015  . OSA on CPAP     Past Surgical History  Procedure Laterality Date  . Multiple tooth extractions      "they took out part of my teeth; I took out the rest when they got loose; was suppose to get dentures; never did"       HPI  from the history and physical done on the day of admission:    Janice Morrow is a  70 y.o. female  Shortness of breath. Gradual onset. Started 1 week ago. Constant. Getting worse. Associated with intermittent productive cough and subjective fevers. He wears CPAP when sleeping. He does not have any rescue inhalers. Associated with mild diffuse chest pain when patient coughs or takes a deep breath but otherwise no chest pain. Worse with ambulation. Improves with rest. At baseline patient is able to sleep lying down but over the last week has had to sleep sitting in a recliner. Additionally patient endorses 3 weeks of lower extremity edema     Hospital Course:     1. Acute on chronic respiratory failure upon admission due to Acute on chronic diastolic CHF EF [id number] on echo a few months ago. Much improved after salt and fluid restriction, IV Lasix, total 6 L negative, symptom-free at baseline she is on home oxygen which will be continued.   He had atypical chest pain which relieved after GI cocktail and will be placed on PPI, EKG and troponin were unremarkable, her creatinine is 1.2 this morning which is  higher than her baseline of 0.9, she will be placed on oral Lasix starting to days from now we'll give her today holiday, request PCP to monitor BMP, weight and diuretic dose closely. Written CHF instructions provided.    2. URI. Continue Levaquin PO, monitor cultures. Now afebrile.  3. History of COPD. No wheezing on exam. Stopped Solu-Medrol, continue supportive care.  4. OSA. On CPAP at night.  5. Morbid obesity. Follow with PCP outpatient postdischarge.  6. History of seizures on Tegretol continued.  7. History of atypical chest pain. Multiple EKGs and 3 sets of troponin all unremarkable, chest pain sounded atypical in GI in origin and relieved after taking GI cocktail, place on PPI, will also place her on 81 of aspirin along with low-dose beta blocker with outpatient cardiology follow-up with her cardiologist Dr. Stanford Breed. She will follow with her cardiologist Dr. Stanford Breed in  1-2 weeks. Off note she's been having chest pain workup in the outpatient setting for several years, to me her chest pain appears extremely atypical and GI in origin.     Discharge Condition: Stable  Follow UP  Follow-up Information    Follow up with [name], MD. Schedule an appointment as soon as possible for a visit in 3 days.   Specialty:  Internal Medicine   Why:  BMP check   Contact information:   [address] [address] [phone]       Follow up with Kirk Ruths, MD. Schedule an appointment as soon as possible for a visit in 1 week.   Specialty:  Cardiology   Contact information:   [address] [place] [address] [phone]        Consults obtained - None  Diet and Activity recommendation: See Discharge Instructions below  Discharge Instructions           Discharge Instructions    Discharge instructions    Complete by:  As directed   Follow with Primary MD [name], MD in 3-4 days   Get CBC, CMP, 2 view Chest X ray checked  by Primary MD next visit.    Activity: As tolerated with Full fall precautions use walker/cane & assistance as needed   Disposition Home     Diet:   Heart Healthy Check your Weight same time everyday, if you gain over 2 pounds, or you develop in leg swelling, experience more shortness of breath or chest pain, call your Primary MD immediately. Follow Cardiac Low Salt Diet and 1.5 lit/day fluid restriction.   On your next visit with your primary care physician please Get Medicines reviewed and adjusted.   Please request your Prim.MD to go over all Hospital Tests and Procedure/Radiological results at the follow up, please get all Hospital records sent to your Prim MD by signing hospital release before you go home.   If you experience worsening of your admission symptoms, develop shortness of breath, life threatening emergency, suicidal or homicidal thoughts you must seek  medical attention immediately by calling 911 or calling your MD immediately  if symptoms less severe.  You Must read complete instructions/literature along with all the possible adverse reactions/side effects for all the Medicines you take and that have been prescribed to you. Take any new Medicines after you have completely understood and accpet all the possible adverse reactions/side effects.   Do not drive, operating heavy machinery, perform activities at heights, swimming or participation in water activities or provide baby sitting services if your  were admitted for syncope or siezures until you have seen by Primary MD or a Neurologist and advised to do so again.  Do not drive when taking Pain medications.    Do not take more than prescribed Pain, Sleep and Anxiety Medications  Special Instructions: If you have smoked or chewed Tobacco  in the last 2 yrs please stop smoking, stop any regular Alcohol  and or any Recreational drug use.  Wear Seat belts while driving.   Please note  You were cared for by a hospitalist during your hospital stay. If you have any questions about your discharge medications or the care you received while you were in the hospital after you are discharged, you can call the unit and asked to speak with the hospitalist on call if the hospitalist that took care of you is not available. Once you are discharged, your primary care physician will handle any further medical issues. Please note that NO REFILLS for any discharge medications will be authorized once you are discharged, as it is imperative that you return to your primary care physician (or establish a relationship with a primary care physician if you do not have one) for your aftercare needs so that they can reassess your need for medications and monitor your lab values.     Increase activity slowly    Complete by:  As directed              Discharge Medications       Medication List    TAKE these  medications        aspirin EC 81 MG tablet  Take 1 tablet (81 mg total) by mouth daily.     carbamazepine 100 MG 12 hr tablet  Commonly known as:  TEGRETOL-XR  Take 1 tablet (100 mg total) by mouth 2 (two) times daily.     furosemide 40 MG tablet  Commonly known as:  LASIX  Take 1 tablet (40 mg total) by mouth daily.  Start taking on:  08/30/2015     HYDROcodone-acetaminophen 10-325 MG tablet  Commonly known as:  NORCO  Take 1 tablet by mouth as needed.     metoprolol tartrate 25 MG tablet  Commonly known as:  LOPRESSOR  Take 1 tablet (25 mg total) by mouth 2 (two) times daily.     pantoprazole 40 MG tablet  Commonly known as:  PROTONIX  Take 1 tablet (40 mg total) by mouth daily. Switch for any other PPI at similar dose and frequency     rizatriptan 10 MG tablet  Commonly known as:  MAXALT  Take 1 tablet (10 mg total) by mouth as needed for migraine. May repeat in 2 hours if needed     tiZANidine 2 MG tablet  Commonly known as:  ZANAFLEX  TAKE 1 TABLET (2 MG TOTAL) BY MOUTH AT BEDTIME.     Umeclidinium-Vilanterol 62.5-25 MCG/INH Aepb  Commonly known as:  ANORO ELLIPTA  Inhale 1 puff into the lungs daily.        Major procedures and Radiology Reports - PLEASE review detailed and final reports for all details, in brief -     Dg Chest 2 View  08/24/2015  CLINICAL DATA:  Cough and phlegm.  LEFT-sided back and chest pain. EXAM: CHEST  2 VIEW COMPARISON:  11/20/2014. FINDINGS: Cardiac enlargement. Interstitial prominence suggesting early edema or diffuse infection. No lobar consolidation. Pulmonary arterial prominence suggesting PAH. No effusion or pneumothorax. No acute osseous findings. IMPRESSION: Cardiomegaly with suspected with  pulmonary arterial hypertension. These changes appear worse compared with March 2016. Diffuse interstitial prominence of the lung fields. Early edema or viral infection not excluded. No lobar consolidation. Electronically Signed   By: Staci Righter  M.D.   On: 08/24/2015 10:14    Micro Results      No results found for this or any previous visit (from the past 240 hour(s)).  Today   Subjective    Janice Morrow today has no headache,no chest abdominal pain,no new weakness tingling or numbness, feels much better wants to go home today.    Objective   Blood pressure 108/64, pulse 78, temperature 99.2 F (37.3 C), temperature source Oral, resp. rate 18, height 5\' 5"  (1.651 m), weight 148.2 kg (326 lb 11.6 oz), SpO2 98 %.   Intake/Output Summary (Last 24 hours) at 08/28/15 0922 Last data filed at 08/28/15 0555  Gross per 24 hour  Intake    840 ml  Output    850 ml  Net    -10 ml    Exam Awake Alert, Oriented x 3, No new F.N deficits, Normal affect [place].AT,PERRAL Supple Neck,No JVD, No cervical lymphadenopathy appriciated.  Symmetrical Chest wall movement, Good air movement bilaterally, CTAB RRR,No Gallops,Rubs or new Murmurs, No Parasternal Heave +ve B.Sounds, Abd Soft, Non tender, No organomegaly appriciated, No rebound -guarding or rigidity. No Cyanosis, Clubbing, minimal edema, No new Rash or bruise   Data Review   CBC w Diff:  Lab Results  Component Value Date   WBC 7.1 08/25/2015   WBC 8.4 05/04/2013   HGB 12.9 08/25/2015   HCT 42.4 08/25/2015   PLT 267 08/25/2015   LYMPHOPCT 36 10/29/2014   MONOPCT 10 10/29/2014   EOSPCT 2 10/29/2014   BASOPCT 0 10/29/2014    CMP:  Lab Results  Component Value Date   NA 138 08/28/2015   NA 139 05/04/2013   K 4.2 08/28/2015   CL 96* 08/28/2015   CO2 34* 08/28/2015   BUN 22* 08/28/2015   BUN 16 05/04/2013   CREATININE 1.20* 08/28/2015   PROT 7.4 08/25/2015   PROT 6.8 05/04/2013   ALBUMIN 2.9* 08/25/2015   ALBUMIN 4.0 05/04/2013   BILITOT 0.4 08/25/2015   ALKPHOS 74 08/25/2015   AST 15 08/25/2015   ALT 14 08/25/2015  .   Total Time in preparing paper work, data evaluation and todays exam - 35 minutes  Thurnell Lose M.D on 08/28/2015 at [place]  [phone]

## 2015-08-31 ENCOUNTER — Telehealth: Payer: Self-pay | Admitting: *Deleted

## 2015-08-31 NOTE — Telephone Encounter (Signed)
Called pt back to complete TCM call completed below.../lmb  Transition Care Management Follow-up Telephone Call   Date discharged? 08/28/15   How have you been since you were released from the hospital? Pt states she is doing a little better still have cough but that is expected   Do you understand why you were in the hospital? YES   Do you understand the discharge instructions? YES   Where were you discharged to? Home   Items Reviewed:  Medications reviewed: YES}  Allergies reviewed: YES  Dietary changes reviewed: YES  Referrals reviewed: YES, pt states they have made appt w/cardiologist for next month   Functional Questionnaire:   Activities of Daily Living (ADLs):   She states she are independent in the following: ambulation, bathing and hygiene, feeding, continence, grooming, toileting and dressing States she require assistance with the following: ambulation sometimes   Any transportation issues/concerns?: NO   Any patient concerns? NO   Confirmed importance and date/time of follow-up visits scheduled YES, pt requested appt for 09/15/15  Provider Appointment booked with Dr. Ronnald Ramp   Confirmed with patient if condition begins to worsen call PCP or go to the ER.  Patient was given the office number and encouraged to call back with question or concerns.  : YES

## 2015-08-31 NOTE — Telephone Encounter (Signed)
Received call. Scheduled for 09/15/2015. She refused to come in any sooner.

## 2015-08-31 NOTE — Telephone Encounter (Signed)
Called pt to get her set up for TCM appt brother [name]) states pt is staying with him and he is not home once he get home will have her to call to get appt set up...[name]

## 2015-09-15 ENCOUNTER — Other Ambulatory Visit (INDEPENDENT_AMBULATORY_CARE_PROVIDER_SITE_OTHER): Payer: Medicare Other

## 2015-09-15 ENCOUNTER — Ambulatory Visit (INDEPENDENT_AMBULATORY_CARE_PROVIDER_SITE_OTHER): Payer: Medicare Other | Admitting: Internal Medicine

## 2015-09-15 ENCOUNTER — Encounter: Payer: Self-pay | Admitting: Internal Medicine

## 2015-09-15 VITALS — BP 124/88 | HR 94 | Temp 98.4°F | Resp 16 | Ht 65.0 in | Wt 329.0 lb

## 2015-09-15 DIAGNOSIS — E785 Hyperlipidemia, unspecified: Secondary | ICD-10-CM

## 2015-09-15 DIAGNOSIS — K2100 Gastro-esophageal reflux disease with esophagitis, without bleeding: Secondary | ICD-10-CM

## 2015-09-15 DIAGNOSIS — R739 Hyperglycemia, unspecified: Secondary | ICD-10-CM

## 2015-09-15 DIAGNOSIS — I10 Essential (primary) hypertension: Secondary | ICD-10-CM

## 2015-09-15 DIAGNOSIS — G8929 Other chronic pain: Secondary | ICD-10-CM

## 2015-09-15 DIAGNOSIS — J410 Simple chronic bronchitis: Secondary | ICD-10-CM

## 2015-09-15 DIAGNOSIS — M549 Dorsalgia, unspecified: Secondary | ICD-10-CM

## 2015-09-15 DIAGNOSIS — G500 Trigeminal neuralgia: Secondary | ICD-10-CM

## 2015-09-15 LAB — LIPID PANEL
Cholesterol: 151 mg/dL (ref 0–200)
HDL: 46.4 mg/dL (ref >39.00)
LDL Cholesterol: 81 mg/dL (ref 0–99)
NONHDL: 104.7
TRIGLYCERIDES: 119 mg/dL (ref 0.0–149.0)
Total CHOL/HDL Ratio: 3
VLDL: 23.8 mg/dL (ref 0.0–40.0)

## 2015-09-15 LAB — BASIC METABOLIC PANEL
BUN: 15 mg/dL (ref 6–23)
CALCIUM: 9.2 mg/dL (ref 8.4–10.5)
CO2: 31 mEq/L (ref 19–32)
Chloride: 103 mEq/L (ref 96–112)
Creatinine, Ser: 1.08 mg/dL (ref 0.40–1.20)
GFR: 65.03 mL/min (ref >60.00)
Glucose, Bld: 150 mg/dL — ABNORMAL HIGH (ref 70–99)
POTASSIUM: 4.2 meq/L (ref 3.5–5.1)
SODIUM: 139 meq/L (ref 135–145)

## 2015-09-15 LAB — TSH: TSH: 2.39 u[IU]/mL (ref 0.35–4.50)

## 2015-09-15 LAB — HEMOGLOBIN A1C: HEMOGLOBIN A1C: 6.7 % — AB (ref 4.6–6.5)

## 2015-09-15 MED ORDER — HYDROCODONE-ACETAMINOPHEN 10-325 MG PO TABS: 1.0000 | ORAL_TABLET | Freq: Four times a day (QID) | ORAL | Status: DC | PRN

## 2015-09-15 MED ORDER — ATORVASTATIN CALCIUM 20 MG PO TABS: 20.0000 mg | ORAL_TABLET | Freq: Every day | ORAL | Status: DC

## 2015-09-15 MED ORDER — CARBAMAZEPINE ER 100 MG PO TB12: 100.0000 mg | ORAL_TABLET | Freq: Two times a day (BID) | ORAL | Status: DC

## 2015-09-15 NOTE — Progress Notes (Signed)
Pre visit review using our clinic review tool, if applicable. No additional management support is needed unless otherwise documented below in the visit note. 

## 2015-09-15 NOTE — Patient Instructions (Signed)
Chronic Obstructive Pulmonary Disease Chronic obstructive pulmonary disease (COPD) is a common lung condition in which airflow from the lungs is limited. COPD is a general term that can be used to describe many different lung problems that limit airflow, including both chronic bronchitis and emphysema. If you have COPD, your lung function will probably never return to normal, but there are measures you can take to improve lung function and make yourself feel better. CAUSES   Smoking (common).  Exposure to secondhand smoke.  Genetic problems.  Chronic inflammatory lung diseases or recurrent infections. SYMPTOMS  Shortness of breath, especially with physical activity.  Deep, persistent (chronic) cough with a large amount of thick mucus.  Wheezing.  Rapid breaths (tachypnea).  Gray or bluish discoloration (cyanosis) of the skin, especially in your fingers, toes, or lips.  Fatigue.  Weight loss.  Frequent infections or episodes when breathing symptoms become much worse (exacerbations).  Chest tightness. DIAGNOSIS Your health care provider will take a medical history and perform a physical examination to diagnose COPD. Additional tests for COPD may include:  Lung (pulmonary) function tests.  Chest X-ray.  CT scan.  Blood tests. TREATMENT  Treatment for COPD may include:  Inhaler and nebulizer medicines. These help manage the symptoms of COPD and make your breathing more comfortable.  Supplemental oxygen. Supplemental oxygen is only helpful if you have a low oxygen level in your blood.  Exercise and physical activity. These are beneficial for nearly all people with COPD.  Lung surgery or transplant.  Nutrition therapy to gain weight, if you are underweight.  Pulmonary rehabilitation. This may involve working with a team of health care providers and specialists, such as respiratory, occupational, and physical therapists. HOME CARE INSTRUCTIONS  Take all medicines  (inhaled or pills) as directed by your health care provider.  Avoid over-the-counter medicines or cough syrups that dry up your airway (such as antihistamines) and slow down the elimination of secretions unless instructed otherwise by your health care provider.  If you are a smoker, the most important thing that you can do is stop smoking. Continuing to smoke will cause further lung damage and breathing trouble. Ask your health care provider for help with quitting smoking. He or she can direct you to community resources or hospitals that provide support.  Avoid exposure to irritants such as smoke, chemicals, and fumes that aggravate your breathing.  Use oxygen therapy and pulmonary rehabilitation if directed by your health care provider. If you require home oxygen therapy, ask your health care provider whether you should purchase a pulse oximeter to measure your oxygen level at home.  Avoid contact with individuals who have a contagious illness.  Avoid extreme temperature and humidity changes.  Eat healthy foods. Eating smaller, more frequent meals and resting before meals may help you maintain your strength.  Stay active, but balance activity with periods of rest. Exercise and physical activity will help you maintain your ability to do things you want to do.  Preventing infection and hospitalization is very important when you have COPD. Make sure to receive all the vaccines your health care provider recommends, especially the pneumococcal and influenza vaccines. Ask your health care provider whether you need a pneumonia vaccine.  Learn and use relaxation techniques to manage stress.  Learn and use controlled breathing techniques as directed by your health care provider. Controlled breathing techniques include:  Pursed lip breathing. Start by breathing in (inhaling) through your nose for 1 second. Then, purse your lips as if you were   going to whistle and breathe out (exhale) through the  pursed lips for 2 seconds.  Diaphragmatic breathing. Start by putting one hand on your abdomen just above your waist. Inhale slowly through your nose. The hand on your abdomen should move out. Then purse your lips and exhale slowly. You should be able to feel the hand on your abdomen moving in as you exhale.  Learn and use controlled coughing to clear mucus from your lungs. Controlled coughing is a series of short, progressive coughs. The steps of controlled coughing are: 1. Lean your head slightly forward. 2. Breathe in deeply using diaphragmatic breathing. 3. Try to hold your breath for 3 seconds. 4. Keep your mouth slightly open while coughing twice. 5. Spit any mucus out into a tissue. 6. Rest and repeat the steps once or twice as needed. SEEK MEDICAL CARE IF:  You are coughing up more mucus than usual.  There is a change in the color or thickness of your mucus.  Your breathing is more labored than usual.  Your breathing is faster than usual. SEEK IMMEDIATE MEDICAL CARE IF:  You have shortness of breath while you are resting.  You have shortness of breath that prevents you from:  Being able to talk.  Performing your usual physical activities.  You have chest pain lasting longer than 5 minutes.  Your skin color is more cyanotic than usual.  You measure low oxygen saturations for longer than 5 minutes with a pulse oximeter. MAKE SURE YOU:  Understand these instructions.  Will watch your condition.  Will get help right away if you are not doing well or get worse.   This information is not intended to replace advice given to you by your health care provider. Make sure you discuss any questions you have with your health care provider.   Document Released: 05/23/2005 Document Revised: 09/03/2014 Document Reviewed: 04/09/2013 Elsevier Interactive Patient Education 2016 [employer] Inc[employer]  

## 2015-09-15 NOTE — Progress Notes (Signed)
Subjective:  Patient ID: Janice Morrow, female    DOB: [birth year]  Age: 70 y.o. MRN: [id number]  CC: Coronary Artery Disease; Cough; COPD; and Back Pain   HPI Janice Morrow presents for follow-up after a recent admission for COPD. She feels much better but still has a mild, intermittent nonproductive cough with shortness of breath.  Outpatient Prescriptions Prior to Visit  Medication Sig Dispense Refill  . aspirin EC 81 MG tablet Take 1 tablet (81 mg total) by mouth daily. 30 tablet 0  . furosemide (LASIX) 40 MG tablet Take 1 tablet (40 mg total) by mouth daily. 30 tablet 0  . metoprolol tartrate (LOPRESSOR) 25 MG tablet Take 1 tablet (25 mg total) by mouth 2 (two) times daily. 60 tablet 0  . pantoprazole (PROTONIX) 40 MG tablet Take 1 tablet (40 mg total) by mouth daily. Switch for any other PPI at similar dose and frequency 30 tablet 3  . rizatriptan (MAXALT) 10 MG tablet Take 1 tablet (10 mg total) by mouth as needed for migraine. May repeat in 2 hours if needed 10 tablet 3  . Umeclidinium-Vilanterol (ANORO ELLIPTA) 62.5-25 MCG/INH AEPB Inhale 1 puff into the lungs daily. 30 each 11  . carbamazepine (TEGRETOL-XR) 100 MG 12 hr tablet Take 1 tablet (100 mg total) by mouth 2 (two) times daily. 30 tablet 5  . HYDROcodone-acetaminophen (NORCO) 10-325 MG per tablet Take 1 tablet by mouth as needed.    [name] tiZANidine (ZANAFLEX) 2 MG tablet TAKE 1 TABLET (2 MG TOTAL) BY MOUTH AT BEDTIME. 30 tablet 0  . aminophylline injection 75 mg      No facility-administered medications prior to visit.    ROS Review of Systems  Constitutional: Negative.  Negative for fever, chills, diaphoresis, appetite change and fatigue.  HENT: Negative.  Negative for congestion, sore throat and trouble swallowing.   Eyes: Negative.   Respiratory: Positive for cough and shortness of breath. Negative for apnea, choking, chest tightness, wheezing and stridor.   Cardiovascular: Negative.  Negative for chest pain,  palpitations and leg swelling.  Gastrointestinal: Negative.  Negative for nausea, vomiting, abdominal pain, diarrhea and constipation.  Endocrine: Negative.   Genitourinary: Negative.   Musculoskeletal: Positive for back pain. Negative for myalgias, joint swelling, arthralgias and neck stiffness.  Skin: Negative.  Negative for color change and rash.  Allergic/Immunologic: Negative.   Neurological: Negative.   Hematological: Negative.  Negative for adenopathy. Does not bruise/bleed easily.  Psychiatric/Behavioral: Negative.     Objective:  BP 124/88 mmHg  Pulse 94  Temp(Src) 98.4 F (36.9 C) (Oral)  Resp 16  Ht 5\' 5"  (1.651 m)  Wt 329 lb (149.233 kg)  BMI 54.75 kg/m2  SpO2 92%  BP Readings from Last 3 Encounters:  09/15/15 124/88  08/28/15 117/76  07/27/15 148/86    Wt Readings from Last 3 Encounters:  09/15/15 329 lb (149.233 kg)  08/28/15 326 lb 11.6 oz (148.2 kg)  07/12/15 337 lb (152.862 kg)    Physical Exam  Constitutional: She is oriented to person, place, and time. No distress.  HENT:  Mouth/Throat: Oropharynx is clear and moist. No oropharyngeal exudate.  Eyes: Conjunctivae are normal. Right eye exhibits no discharge. Left eye exhibits no discharge. No scleral icterus.  Neck: Normal range of motion. Neck supple. No JVD present. No tracheal deviation present. No thyromegaly present.  Cardiovascular: Normal rate, regular rhythm, normal heart sounds and intact distal pulses.  Exam reveals no gallop and no friction rub.   No  murmur heard. Pulmonary/Chest: Effort normal and breath sounds normal. No stridor. No respiratory distress. She has no wheezes. She has no rales. She exhibits no tenderness.  Abdominal: Soft. Bowel sounds are normal. She exhibits no distension and no mass. There is no tenderness. There is no rebound and no guarding.  Musculoskeletal: Normal range of motion. She exhibits no edema or tenderness.  Lymphadenopathy:    She has no cervical adenopathy.    Neurological: She is oriented to person, place, and time.  Skin: Skin is warm and dry. No rash noted. She is not diaphoretic. No erythema. No pallor.  Vitals reviewed.   Lab Results  Component Value Date   WBC 7.1 08/25/2015   HGB 12.9 08/25/2015   HCT 42.4 08/25/2015   PLT 267 08/25/2015   GLUCOSE 150* 09/15/2015   CHOL 151 09/15/2015   TRIG 119.0 09/15/2015   HDL 46.40 09/15/2015   LDLCALC 81 09/15/2015   ALT 14 08/25/2015   AST 15 08/25/2015   NA 139 09/15/2015   K 4.2 09/15/2015   CL 103 09/15/2015   CREATININE 1.08 09/15/2015   BUN 15 09/15/2015   CO2 31 09/15/2015   TSH 2.39 09/15/2015   INR 1.12 08/22/2014   HGBA1C 6.7* 09/15/2015    Dg Chest 2 View  08/24/2015  CLINICAL DATA:  Cough and phlegm.  LEFT-sided back and chest pain. EXAM: CHEST  2 VIEW COMPARISON:  11/20/2014. FINDINGS: Cardiac enlargement. Interstitial prominence suggesting early edema or diffuse infection. No lobar consolidation. Pulmonary arterial prominence suggesting PAH. No effusion or pneumothorax. No acute osseous findings. IMPRESSION: Cardiomegaly with suspected with pulmonary arterial hypertension. These changes appear worse compared with March 2016. Diffuse interstitial prominence of the lung fields. Early edema or viral infection not excluded. No lobar consolidation. Electronically Signed   By: Staci Righter M.D.   On: 08/24/2015 10:14    Assessment & Plan:   Janice Morrow was seen today for coronary artery disease, cough, copd and back pain.  Diagnoses and all orders for this visit:  Essential hypertension- her blood pressure is well-controlled, lites and renal function are stable. -     Hemoglobin A1c; Future -     Basic metabolic panel; Future  Hyperglycemia- she has developed type 2 diabetes mellitus with an A1c of 6.7%, she does not need any medications for this, she will work on her lifestyle modifications. -     Hemoglobin A1c; Future -     Basic metabolic panel; Future  Hyperlipidemia  with target LDL less than 130- she has achieved her LDL goal -     Lipid panel; Future -     TSH; Future -     atorvastatin (LIPITOR) 20 MG tablet; Take 1 tablet (20 mg total) by mouth daily.  Gastroesophageal reflux disease with esophagitis- I encouraged her to continue the proton pump inhibitor  Chronic back pain -     HYDROcodone-acetaminophen (NORCO) 10-325 MG tablet; Take 1 tablet by mouth every 6 (six) hours as needed.  Trigeminal neuralgia of right side of face -     carbamazepine (TEGRETOL-XR) 100 MG 12 hr tablet; Take 1 tablet (100 mg total) by mouth 2 (two) times daily.  Simple chronic bronchitis ([place])- proven noted, she will continue her current therapy   I have discontinued Ms. Atchley's tiZANidine. I have also changed her HYDROcodone-acetaminophen. Additionally, I am having her start on atorvastatin. Lastly, I am having her maintain her Umeclidinium-Vilanterol, rizatriptan, metoprolol tartrate, aspirin EC, pantoprazole, furosemide, gabapentin, isosorbide mononitrate, and carbamazepine.  We will stop administering aminophylline.  Meds ordered this encounter  Medications  . gabapentin (NEURONTIN) 300 MG capsule    Sig: Take 300 mg by mouth 2 (two) times daily.    Refill:  2  . isosorbide mononitrate (IMDUR) 30 MG 24 hr tablet    Sig:   . HYDROcodone-acetaminophen (NORCO) 10-325 MG tablet    Sig: Take 1 tablet by mouth every 6 (six) hours as needed.    Dispense:  90 tablet    Refill:  0  . carbamazepine (TEGRETOL-XR) 100 MG 12 hr tablet    Sig: Take 1 tablet (100 mg total) by mouth 2 (two) times daily.    Dispense:  30 tablet    Refill:  5  . atorvastatin (LIPITOR) 20 MG tablet    Sig: Take 1 tablet (20 mg total) by mouth daily.    Dispense:  90 tablet    Refill:  3     Follow-up: Return in about 6 months (around 03/14/2016).  [name], MD

## 2015-09-20 ENCOUNTER — Other Ambulatory Visit: Payer: Self-pay | Admitting: *Deleted

## 2015-09-20 MED ORDER — ISOSORBIDE MONONITRATE ER 30 MG PO TB24: 30.0000 mg | ORAL_TABLET | Freq: Every day | ORAL | Status: DC

## 2015-09-21 DIAGNOSIS — M47817 Spondylosis without myelopathy or radiculopathy, lumbosacral region: Secondary | ICD-10-CM | POA: Diagnosis not present

## 2015-09-21 DIAGNOSIS — M4696 Unspecified inflammatory spondylopathy, lumbar region: Secondary | ICD-10-CM | POA: Diagnosis not present

## 2015-09-21 DIAGNOSIS — G894 Chronic pain syndrome: Secondary | ICD-10-CM | POA: Diagnosis not present

## 2015-09-21 DIAGNOSIS — Z79899 Other long term (current) drug therapy: Secondary | ICD-10-CM | POA: Diagnosis not present

## 2015-09-21 DIAGNOSIS — M5137 Other intervertebral disc degeneration, lumbosacral region: Secondary | ICD-10-CM | POA: Diagnosis not present

## 2015-10-05 DIAGNOSIS — M47817 Spondylosis without myelopathy or radiculopathy, lumbosacral region: Secondary | ICD-10-CM | POA: Diagnosis not present

## 2015-10-06 DIAGNOSIS — M545 Low back pain: Secondary | ICD-10-CM | POA: Diagnosis not present

## 2015-10-19 DIAGNOSIS — M47817 Spondylosis without myelopathy or radiculopathy, lumbosacral region: Secondary | ICD-10-CM | POA: Diagnosis not present

## 2015-10-19 DIAGNOSIS — G894 Chronic pain syndrome: Secondary | ICD-10-CM | POA: Diagnosis not present

## 2015-10-19 DIAGNOSIS — Z79899 Other long term (current) drug therapy: Secondary | ICD-10-CM | POA: Diagnosis not present

## 2015-10-21 ENCOUNTER — Encounter: Payer: Self-pay | Admitting: Gastroenterology

## 2015-10-24 NOTE — Progress Notes (Signed)
HPI: Follow-up chest pain. Chest CT February 2016 showed no pulmonary embolus. Right breast nodule and mammogram recommended. Echocardiogram March 2016 showed normal LV function, mild left ventricular enlargement, Mild left ventricular hypertrophy, grade 1 diastolic dysfunction and mild biatrial enlargement. Nuclear study March 2016 showed ejection fraction 58%. Breast attenuation but no ischemia. Nuclear study repeated October 2016. Defects described in the anterior and inferior walls partially reversible. Felt possibly secondary to attenuation but ischemia cannot be excluded. EF 65. We decided to treat medically as her CP is atypical and chronic. Since last seen,   Current Outpatient Prescriptions  Medication Sig Dispense Refill  . aspirin EC 81 MG tablet Take 1 tablet (81 mg total) by mouth daily. 30 tablet 0  . atorvastatin (LIPITOR) 20 MG tablet Take 1 tablet (20 mg total) by mouth daily. 90 tablet 3  . carbamazepine (TEGRETOL-XR) 100 MG 12 hr tablet Take 1 tablet (100 mg total) by mouth 2 (two) times daily. 30 tablet 5  . furosemide (LASIX) 40 MG tablet Take 1 tablet (40 mg total) by mouth daily. 30 tablet 0  . gabapentin (NEURONTIN) 300 MG capsule Take 300 mg by mouth 2 (two) times daily.  2  . HYDROcodone-acetaminophen (NORCO) 10-325 MG tablet Take 1 tablet by mouth every 6 (six) hours as needed. 90 tablet 0  . isosorbide mononitrate (IMDUR) 30 MG 24 hr tablet Take 1 tablet (30 mg total) by mouth daily. 90 tablet 1  . metoprolol tartrate (LOPRESSOR) 25 MG tablet Take 1 tablet (25 mg total) by mouth 2 (two) times daily. 60 tablet 0  . pantoprazole (PROTONIX) 40 MG tablet Take 1 tablet (40 mg total) by mouth daily. Switch for any other PPI at similar dose and frequency 30 tablet 3  . rizatriptan (MAXALT) 10 MG tablet Take 1 tablet (10 mg total) by mouth as needed for migraine. May repeat in 2 hours if needed 10 tablet 3  . Umeclidinium-Vilanterol (ANORO ELLIPTA) 62.5-25 MCG/INH AEPB  Inhale 1 puff into the lungs daily. 30 each 11   No current facility-administered medications for this visit.     Past Medical History  Diagnosis Date  . Heart murmur   . Headache(784.0)   . COPD (chronic obstructive pulmonary disease) ([place])   . GERD (gastroesophageal reflux disease)   . PVD (peripheral vascular disease) ([place])   . Hyperlipidemia   . Cervicalgia   . Migraine without aura, with intractable migraine, so stated, without mention of status migrainosus   . PVD (peripheral vascular disease) ([place])   . Osteoarthritis   . Chronic back pain   . Trigeminal neuralgia   . Shortness of breath dyspnea   . Seizures ([place]) 07/2014, 10/2014  . Acute diastolic CHF (congestive heart failure) ([place]) 08/24/2015  . Essential hypertension 08/24/2015  . OSA on CPAP     Past Surgical History  Procedure Laterality Date  . Multiple tooth extractions      "they took out part of my teeth; I took out the rest when they got loose; was suppose to get dentures; never did"    Social History   Social History  . Marital Status: Single    Spouse Name: N/A  . Number of Children: 1  . Years of Education: 12th   Occupational History  . DISABLED    Social History Main Topics  . Smoking status: Former Smoker    Types: Cigarettes    Quit date: 08/22/2010  . Smokeless tobacco: Never Used  . Alcohol  Use: No  . Drug Use: No  . Sexual Activity: No   Other Topics Concern  . Not on file   Social History Narrative   Patient lives at home with her brother.    Disabled.   Education high school.   Right handed.   Caffeine Use: 5-6 20oz bottle sodas daily    Family History  Problem Relation Age of Onset  . Heart attack Father   . Heart disease    . Diabetes    . Diabetes Mother   . Dementia Mother   . Diabetes Brother     ROS: no fevers or chills, productive cough, hemoptysis, dysphasia, odynophagia, melena, hematochezia, dysuria, hematuria, rash, seizure activity, orthopnea, PND,  pedal edema, claudication. Remaining systems are negative.  Physical Exam: Well-developed well-nourished in no acute distress.  Skin is warm and dry.  HEENT is normal.  Neck is supple.  Chest is clear to auscultation with normal expansion.  Cardiovascular exam is regular rate and rhythm.  Abdominal exam nontender or distended. No masses palpated. Extremities show no edema. neuro grossly intact  ECG     This encounter was created in error - please disregard.

## 2015-10-25 ENCOUNTER — Encounter: Payer: Medicare Other | Admitting: Cardiology

## 2015-11-15 NOTE — Progress Notes (Signed)
HPI: Follow-up chest pain. Echocardiogram March 2016 showed normal LV function, mild left ventricular enlargement, Mild left ventricular hypertrophy, grade 1 diastolic dysfunction and mild biatrial enlargement. Nuclear study March 2016 showed ejection fraction 58%. Breast attenuation but no ischemia. Seen by Cecilie Kicks for chest pain recently. Nuclear study repeated October 2016. Defects described in the anterior and inferior walls partially reversible. Felt possibly secondary to attenuation but ischemia cannot be excluded. EF 65. Cath discussed at last ov but we decided to treat medically. Since last seen, She continues to have occasional chest pain. It is in the left chest area and described as a sharp cramping feeling. Occurs predominantly at night. Not exertional, pleuritic or related to food. No radiation. Some dyspnea and diaphoresis by report. Lasts 5-10 minutes and resolves. She has had intermittent pain like this since age 28.  Current Outpatient Prescriptions  Medication Sig Dispense Refill  . furosemide (LASIX) 40 MG tablet Take 1 tablet (40 mg total) by mouth daily. 30 tablet 0  . HYDROcodone-acetaminophen (NORCO) 10-325 MG tablet Take 1 tablet by mouth every 6 (six) hours as needed. 90 tablet 0  . rizatriptan (MAXALT) 10 MG tablet Take 1 tablet (10 mg total) by mouth as needed for migraine. May repeat in 2 hours if needed 10 tablet 3  . Umeclidinium-Vilanterol (ANORO ELLIPTA) 62.5-25 MCG/INH AEPB Inhale 1 puff into the lungs daily. 30 each 11  . aspirin EC 81 MG tablet Take 1 tablet (81 mg total) by mouth daily. (Patient not taking: Reported on 11/22/2015) 30 tablet 0  . atorvastatin (LIPITOR) 20 MG tablet Take 1 tablet (20 mg total) by mouth daily. (Patient not taking: Reported on 11/22/2015) 90 tablet 3  . carbamazepine (TEGRETOL-XR) 100 MG 12 hr tablet Take 1 tablet (100 mg total) by mouth 2 (two) times daily. (Patient not taking: Reported on 11/22/2015) 30 tablet 5  . gabapentin  (NEURONTIN) 300 MG capsule Take 300 mg by mouth 2 (two) times daily. Reported on 11/22/2015  2  . isosorbide mononitrate (IMDUR) 30 MG 24 hr tablet Take 1 tablet (30 mg total) by mouth daily. (Patient not taking: Reported on 11/22/2015) 90 tablet 1  . metoprolol tartrate (LOPRESSOR) 25 MG tablet Take 1 tablet (25 mg total) by mouth 2 (two) times daily. (Patient not taking: Reported on 11/22/2015) 60 tablet 0  . pantoprazole (PROTONIX) 40 MG tablet Take 1 tablet (40 mg total) by mouth daily. Switch for any other PPI at similar dose and frequency (Patient not taking: Reported on 11/22/2015) 30 tablet 3   No current facility-administered medications for this visit.     Past Medical History  Diagnosis Date  . Heart murmur   . Headache(784.0)   . COPD (chronic obstructive pulmonary disease) ([place])   . GERD (gastroesophageal reflux disease)   . PVD (peripheral vascular disease) ([place])   . Hyperlipidemia   . Cervicalgia   . Migraine without aura, with intractable migraine, so stated, without mention of status migrainosus   . PVD (peripheral vascular disease) ([place])   . Osteoarthritis   . Chronic back pain   . Trigeminal neuralgia   . Shortness of breath dyspnea   . Seizures ([place]) 07/2014, 10/2014  . Acute diastolic CHF (congestive heart failure) ([place]) 08/24/2015  . Essential hypertension 08/24/2015  . OSA on CPAP     Past Surgical History  Procedure Laterality Date  . Multiple tooth extractions      "they took out part of my teeth; I took  out the rest when they got loose; was suppose to get dentures; never did"    Social History   Social History  . Marital Status: Single    Spouse Name: N/A  . Number of Children: 1  . Years of Education: 12th   Occupational History  . DISABLED    Social History Main Topics  . Smoking status: Former Smoker    Types: Cigarettes    Quit date: 08/22/2010  . Smokeless tobacco: Never Used  . Alcohol Use: No  . Drug Use: No  . Sexual Activity: No    Other Topics Concern  . Not on file   Social History Narrative   Patient lives at home with her brother.    Disabled.   Education high school.   Right handed.   Caffeine Use: 5-6 20oz bottle sodas daily    Family History  Problem Relation Age of Onset  . Heart attack Father   . Heart disease    . Diabetes    . Diabetes Mother   . Dementia Mother   . Diabetes Brother     ROS: no fevers or chills, productive cough, hemoptysis, dysphasia, odynophagia, melena, hematochezia, dysuria, hematuria, rash, seizure activity, orthopnea, PND, pedal edema, claudication. Remaining systems are negative.  Physical Exam: Well-developed obese in no acute distress.  Skin is warm and dry.  HEENT is normal.  Neck is supple.  Chest is clear to auscultation with normal expansion.  Cardiovascular exam is regular rate and rhythm.  Abdominal exam nontender or distended. No masses palpated. Extremities show no edema. neuro grossly intact  ECG 08/27/2015-sinus rhythm with PVCs, left axis deviation, left atrial enlargement, left ventricular hypertrophy, cannot rule out prior anterior infarct.    Electrocardiogram today shows sinus with PVCs, left axis deviation, cannot rule out prior anterior infarct.

## 2015-11-16 DIAGNOSIS — G894 Chronic pain syndrome: Secondary | ICD-10-CM | POA: Diagnosis not present

## 2015-11-16 DIAGNOSIS — M4696 Unspecified inflammatory spondylopathy, lumbar region: Secondary | ICD-10-CM | POA: Diagnosis not present

## 2015-11-16 DIAGNOSIS — M5137 Other intervertebral disc degeneration, lumbosacral region: Secondary | ICD-10-CM | POA: Diagnosis not present

## 2015-11-16 DIAGNOSIS — M47817 Spondylosis without myelopathy or radiculopathy, lumbosacral region: Secondary | ICD-10-CM | POA: Diagnosis not present

## 2015-11-16 DIAGNOSIS — Z79899 Other long term (current) drug therapy: Secondary | ICD-10-CM | POA: Diagnosis not present

## 2015-11-17 ENCOUNTER — Ambulatory Visit: Payer: Medicare Other | Admitting: Adult Health

## 2015-11-17 ENCOUNTER — Telehealth: Payer: Self-pay | Admitting: *Deleted

## 2015-11-17 NOTE — Telephone Encounter (Signed)
Called to cancel morning of appointment - no transportation.

## 2015-11-18 ENCOUNTER — Encounter: Payer: Self-pay | Admitting: Adult Health

## 2015-11-22 ENCOUNTER — Ambulatory Visit (INDEPENDENT_AMBULATORY_CARE_PROVIDER_SITE_OTHER): Payer: Medicare Other | Admitting: Cardiology

## 2015-11-22 ENCOUNTER — Encounter: Payer: Self-pay | Admitting: Cardiology

## 2015-11-22 VITALS — BP 124/60 | HR 60 | Ht 65.0 in | Wt 315.0 lb

## 2015-11-22 DIAGNOSIS — E785 Hyperlipidemia, unspecified: Secondary | ICD-10-CM

## 2015-11-22 DIAGNOSIS — R072 Precordial pain: Secondary | ICD-10-CM | POA: Diagnosis not present

## 2015-11-22 DIAGNOSIS — I10 Essential (primary) hypertension: Secondary | ICD-10-CM | POA: Diagnosis not present

## 2015-11-22 DIAGNOSIS — I5031 Acute diastolic (congestive) heart failure: Secondary | ICD-10-CM

## 2015-11-22 DIAGNOSIS — E669 Obesity, unspecified: Secondary | ICD-10-CM

## 2015-11-22 NOTE — Assessment & Plan Note (Signed)
Patient continues to have intermittent chest pain. Her description is extremely atypical. She has had these pains intermittently since age 70. Most recent nuclear study described as abnormal but she has large breasts and certainly soft tissue attenuation could have contributed. I have reviewed her images and again discussed options with her. We discussed proceeding with a catheterization and she would prefer to avoid and I think this is reasonable given the atypical nature of her symptoms. We can consider in the future if her symptoms change.

## 2015-11-22 NOTE — Assessment & Plan Note (Signed)
Patient appears to be euvolemic on examination. Continue present dose of Lasix. 

## 2015-11-22 NOTE — Assessment & Plan Note (Signed)
Needs weight loss. 

## 2015-11-22 NOTE — Assessment & Plan Note (Signed)
Blood pressure controlled. Continue present medications. 

## 2015-11-22 NOTE — Patient Instructions (Signed)
Your physician wants you to follow-up in: 6 MONTHS WITH DR CRENSHAW You will receive a reminder letter in the mail two months in advance. If you don't receive a letter, please call our office to schedule the follow-up appointment.  

## 2015-11-22 NOTE — Assessment & Plan Note (Signed)
Continue statin. 

## 2015-11-29 ENCOUNTER — Encounter: Payer: Self-pay | Admitting: Adult Health

## 2015-11-29 ENCOUNTER — Ambulatory Visit (INDEPENDENT_AMBULATORY_CARE_PROVIDER_SITE_OTHER): Payer: Medicare Other | Admitting: Adult Health

## 2015-11-29 VITALS — BP 119/66 | HR 78 | Resp 22 | Ht 65.0 in | Wt 315.0 lb

## 2015-11-29 DIAGNOSIS — G43019 Migraine without aura, intractable, without status migrainosus: Secondary | ICD-10-CM

## 2015-11-29 NOTE — Progress Notes (Addendum)
PATIENT: Janice Morrow DOB: [birth year]  REASON FOR VISIT: follow up- intractable headaches HISTORY FROM: patient  HISTORY OF PRESENT ILLNESS: Today 11/29/2015 Janice Morrow is a 70 year old female with a history of intractable headaches. She returns today for follow-up. She reports that she is currently taking gabapentin and was recently placed on Flexeril by her PCP. She states that this is giving her some relief with her headaches. She states that she had 3-6 headaches a week. Her headaches always occur on the right side. She will occasionally have nausea and vomiting. She states she has photophobia and phonophobia. She reports that she stopped the carbamazepine she did not feel it was beneficial. In the past she is also been on tizanidine with no benefit. At this time the patient feels that this medication regimen is best for her. She does not want any additional medication. She reports that she takes hydrocodone prescribed by her pain clinic for back pain. She denies any new neurological symptoms. She returns today for an evaluation.  Janice Morrow  PRIOR HPI 11/11/12 (PS): 70 year old lady with chronic headaches likely transform migraines with and algesic rebound. She has failed trials of tramadol, trexomet,tylenol,, goody powder, Percocet, Topamax, Botox, Depakote and Zanaflex in the past. She has underlying sleep apnea and musculoskeletal neck and shoulder pain as contributing triggers for her headache.  She returns today for followup after last visit on 06/30/12 requesting urgent appointment because of worsening headaches. She states for the last couple of months headaches have gotten worse and have now become daily. She has been taking migraine Excedrin on a daily basis for couple of weeks without much relief and has now switched to taking Goody powders with similar results. She did obtain relief with Zanaflex which had prescribed in the past but her insurance company is refusing to cover it for headache.  She has not been doing regular neck stretching exercises of participating in any stress relaxation activities.  Update 04/14/2015 :  She returns for follow-up after last visit. Months ago. She continues to have headaches around 3 times per week. There is still right hemicranial moderate to severe intensity accompanied by occasional nausea and vomiting. She takes hydrocodone which helps. She was seen by Dr. Lavell Anchors who attempted sphenocath insertion but patient did not tolerate the procedure and could not get treated. Update 07/18/15 (MM): Janice Morrow is a 70 year old female with a history of migraine headaches. She returns today for follow-up. The patient has been on a multitude of medications without benefit. She also tried the spheno cath procedure but was unable to tolerate it. At the last visit she was given a prescription of indomethacin 25 mg 3 times a day. She reports that she cannot tolerate this medication. She states that she continues to get headaches. They are normally located on the right side of the head. Sometimes she'll have a sharp pain in the cheek area. She does confirm nausea and vomiting with her headaches. She also has photophobia and phonophobia. She is currently on carbamazepine and gabapentin without any benefit. The patient has been referred to a headache clinic before in the past. Her primary care prescribed her Imitrex and Phenergan but she could not afford this medication. She returns today for an evaluation  REVIEW OF SYSTEMS: Out of a complete 14 system review of symptoms, the patient complains only of the following symptoms, and all other reviewed systems are negative.  Wheezing, leg swelling, murmur, tremors, seizure, headache, dizziness, back pain  ALLERGIES: Allergies  Allergen  Reactions  . Baclofen Other (See Comments)    Causes seizures    HOME MEDICATIONS: Outpatient Prescriptions Prior to Visit  Medication Sig Dispense Refill  . aspirin EC 81 MG tablet Take 1  tablet (81 mg total) by mouth daily. 30 tablet 0  . atorvastatin (LIPITOR) 20 MG tablet Take 1 tablet (20 mg total) by mouth daily. 90 tablet 3  . carbamazepine (TEGRETOL-XR) 100 MG 12 hr tablet Take 1 tablet (100 mg total) by mouth 2 (two) times daily. 30 tablet 5  . furosemide (LASIX) 40 MG tablet Take 1 tablet (40 mg total) by mouth daily. 30 tablet 0  . gabapentin (NEURONTIN) 300 MG capsule Take 300 mg by mouth 2 (two) times daily. Reported on 11/22/2015  2  . HYDROcodone-acetaminophen (NORCO) 10-325 MG tablet Take 1 tablet by mouth every 6 (six) hours as needed. 90 tablet 0  . isosorbide mononitrate (IMDUR) 30 MG 24 hr tablet Take 1 tablet (30 mg total) by mouth daily. 90 tablet 1  . metoprolol tartrate (LOPRESSOR) 25 MG tablet Take 1 tablet (25 mg total) by mouth 2 (two) times daily. 60 tablet 0  . pantoprazole (PROTONIX) 40 MG tablet Take 1 tablet (40 mg total) by mouth daily. Switch for any other PPI at similar dose and frequency 30 tablet 3  . rizatriptan (MAXALT) 10 MG tablet Take 1 tablet (10 mg total) by mouth as needed for migraine. May repeat in 2 hours if needed 10 tablet 3  . Umeclidinium-Vilanterol (ANORO ELLIPTA) 62.5-25 MCG/INH AEPB Inhale 1 puff into the lungs daily. 30 each 11   No facility-administered medications prior to visit.    PAST MEDICAL HISTORY: Past Medical History  Diagnosis Date  . Heart murmur   . Headache(784.0)   . COPD (chronic obstructive pulmonary disease) ([place])   . GERD (gastroesophageal reflux disease)   . PVD (peripheral vascular disease) ([place])   . Hyperlipidemia   . Cervicalgia   . Migraine without aura, with intractable migraine, so stated, without mention of status migrainosus   . PVD (peripheral vascular disease) ([place])   . Osteoarthritis   . Chronic back pain   . Trigeminal neuralgia   . Shortness of breath dyspnea   . Seizures ([place]) 07/2014, 10/2014  . Acute diastolic CHF (congestive heart failure) ([place]) 08/24/2015  . Essential  hypertension 08/24/2015  . OSA on CPAP     PAST SURGICAL HISTORY: Past Surgical History  Procedure Laterality Date  . Multiple tooth extractions      "they took out part of my teeth; I took out the rest when they got loose; was suppose to get dentures; never did"    FAMILY HISTORY: Family History  Problem Relation Age of Onset  . Heart attack Father   . Heart disease    . Diabetes    . Diabetes Mother   . Dementia Mother   . Diabetes Brother     SOCIAL HISTORY: Social History   Social History  . Marital Status: Single    Spouse Name: N/A  . Number of Children: 1  . Years of Education: 12th   Occupational History  . DISABLED    Social History Main Topics  . Smoking status: Former Smoker    Types: Cigarettes    Quit date: 08/22/2010  . Smokeless tobacco: Never Used  . Alcohol Use: No  . Drug Use: No  . Sexual Activity: No   Other Topics Concern  . Not on file   Social History Narrative  Patient lives at home with her brother.    Disabled.   Education high school.   Right handed.   Caffeine Use: 5-6 20oz bottle sodas daily      PHYSICAL EXAM  Filed Vitals:   11/29/15 1518  BP: 119/66  Pulse: 78  Resp: 22  Height: 5\' 5"  (1.651 m)  Weight: 315 lb (142.883 kg)   Body mass index is 52.42 kg/(m^2).  Generalized: Well developed, in no acute distress, obese  Neurological examination  Mentation: Alert oriented to time, place, history taking. Follows all commands speech and language fluent Cranial nerve II-XII: Pupils were equal round reactive to light. Extraocular movements were full, visual field were full on confrontational test. Facial sensation and strength were normal. Uvula tongue midline. Head turning and shoulder shrug  were normal and symmetric. Motor: The motor testing reveals 5 over 5 strength of all 4 extremities. Good symmetric motor tone is noted throughout.  Sensory: Sensory testing is intact to soft touch on all 4 extremities. No  evidence of extinction is noted.  Coordination: Cerebellar testing reveals good finger-nose-finger and heel-to-shin bilaterally.  Gait and station: Patient uses a cane to ambulate with. Gait is slightly unsteady. She becomes very short of breath with ambulation. Reflexes: Deep tendon reflexes are symmetric and normal bilaterally.   DIAGNOSTIC DATA (LABS, IMAGING, TESTING) - I reviewed patient records, labs, notes, testing and imaging myself where available.  Lab Results  Component Value Date   WBC 7.1 08/25/2015   HGB 12.9 08/25/2015   HCT 42.4 08/25/2015   MCV 90.0 08/25/2015   PLT 267 08/25/2015      Component Value Date/Time   NA 139 09/15/2015 1605   NA 139 05/04/2013 1602   K 4.2 09/15/2015 1605   CL 103 09/15/2015 1605   CO2 31 09/15/2015 1605   GLUCOSE 150* 09/15/2015 1605   GLUCOSE 102* 05/04/2013 1602   BUN 15 09/15/2015 1605   BUN 16 05/04/2013 1602   CREATININE 1.08 09/15/2015 1605   CALCIUM 9.2 09/15/2015 1605   PROT 7.4 08/25/2015 0517   PROT 6.8 05/04/2013 1602   ALBUMIN 2.9* 08/25/2015 0517   ALBUMIN 4.0 05/04/2013 1602   AST 15 08/25/2015 0517   ALT 14 08/25/2015 0517   ALKPHOS 74 08/25/2015 0517   BILITOT 0.4 08/25/2015 0517   GFRNONAA 46* 08/28/2015 0408   GFRAA 53* 08/28/2015 0408   Lab Results  Component Value Date   CHOL 151 09/15/2015   HDL 46.40 09/15/2015   LDLCALC 81 09/15/2015   TRIG 119.0 09/15/2015   CHOLHDL 3 09/15/2015   Lab Results  Component Value Date   HGBA1C 6.7* 09/15/2015       ASSESSMENT AND PLAN 70 y.o. year old female  has a past medical history of Heart murmur; Headache(784.0); COPD (chronic obstructive pulmonary disease) ([facility]); GERD (gastroesophageal reflux disease); PVD (peripheral vascular disease) ([place]); Hyperlipidemia; Cervicalgia; Migraine without aura, with intractable migraine, so stated, without mention of status migrainosus; PVD (peripheral vascular disease) ([place]); Osteoarthritis; Chronic back pain; Trigeminal  neuralgia; Shortness of breath dyspnea; Seizures ([place]) (07/2014, 10/2014); Acute diastolic CHF (congestive heart failure) ([place]) (08/24/2015); Essential hypertension (08/24/2015); and OSA on CPAP. here with:  1. Headaches  At this time the patient does not want to add on any additional medication. She will continue on gabapentin 300 mg twice a day. She will continue using Flexeril prescribed by her PCP as well. Patient advised that if her headache frequency or severity increases she should let us know. She  will follow-up in 6 months with Dr. Leonie Man.   Ward Givens, MSN, NP-C 11/29/2015, 3:57 PM [facility] [address] [place], [place] [address] [phone]   Personally participated in and made any corrections needed to history, physical, neuro exam,assessment and plan as stated above.  I have personally evaluated lab data, reviewed imaging studies and agree with radiology interpretations.    Sarina Ill, MD [facility]

## 2015-11-29 NOTE — Patient Instructions (Signed)
Continue gabapentin If your symptoms worsen or you develop new symptoms please let us know.   

## 2015-12-26 DIAGNOSIS — Z79891 Long term (current) use of opiate analgesic: Secondary | ICD-10-CM | POA: Diagnosis not present

## 2015-12-26 DIAGNOSIS — G894 Chronic pain syndrome: Secondary | ICD-10-CM | POA: Diagnosis not present

## 2015-12-26 DIAGNOSIS — M545 Low back pain: Secondary | ICD-10-CM | POA: Diagnosis not present

## 2015-12-26 DIAGNOSIS — Z79899 Other long term (current) drug therapy: Secondary | ICD-10-CM | POA: Diagnosis not present

## 2015-12-26 DIAGNOSIS — M792 Neuralgia and neuritis, unspecified: Secondary | ICD-10-CM | POA: Diagnosis not present

## 2016-01-24 DIAGNOSIS — G894 Chronic pain syndrome: Secondary | ICD-10-CM | POA: Diagnosis not present

## 2016-01-24 DIAGNOSIS — M4696 Unspecified inflammatory spondylopathy, lumbar region: Secondary | ICD-10-CM | POA: Diagnosis not present

## 2016-01-24 DIAGNOSIS — Z79891 Long term (current) use of opiate analgesic: Secondary | ICD-10-CM | POA: Diagnosis not present

## 2016-01-24 DIAGNOSIS — Z79899 Other long term (current) drug therapy: Secondary | ICD-10-CM | POA: Diagnosis not present

## 2016-01-24 DIAGNOSIS — M5137 Other intervertebral disc degeneration, lumbosacral region: Secondary | ICD-10-CM | POA: Diagnosis not present

## 2016-01-24 DIAGNOSIS — M47817 Spondylosis without myelopathy or radiculopathy, lumbosacral region: Secondary | ICD-10-CM | POA: Diagnosis not present

## 2016-02-21 DIAGNOSIS — Z79899 Other long term (current) drug therapy: Secondary | ICD-10-CM | POA: Diagnosis not present

## 2016-02-21 DIAGNOSIS — G894 Chronic pain syndrome: Secondary | ICD-10-CM | POA: Diagnosis not present

## 2016-02-21 DIAGNOSIS — M47817 Spondylosis without myelopathy or radiculopathy, lumbosacral region: Secondary | ICD-10-CM | POA: Diagnosis not present

## 2016-02-21 DIAGNOSIS — Z79891 Long term (current) use of opiate analgesic: Secondary | ICD-10-CM | POA: Diagnosis not present

## 2016-02-21 DIAGNOSIS — M4696 Unspecified inflammatory spondylopathy, lumbar region: Secondary | ICD-10-CM | POA: Diagnosis not present

## 2016-02-21 DIAGNOSIS — M5137 Other intervertebral disc degeneration, lumbosacral region: Secondary | ICD-10-CM | POA: Diagnosis not present

## 2016-03-14 ENCOUNTER — Other Ambulatory Visit (INDEPENDENT_AMBULATORY_CARE_PROVIDER_SITE_OTHER): Payer: Medicare Other

## 2016-03-14 ENCOUNTER — Encounter: Payer: Self-pay | Admitting: Internal Medicine

## 2016-03-14 ENCOUNTER — Ambulatory Visit (INDEPENDENT_AMBULATORY_CARE_PROVIDER_SITE_OTHER): Payer: Medicare Other | Admitting: Internal Medicine

## 2016-03-14 VITALS — BP 124/80 | HR 84 | Temp 98.7°F | Resp 16 | Ht 65.0 in | Wt 317.0 lb

## 2016-03-14 DIAGNOSIS — I519 Heart disease, unspecified: Secondary | ICD-10-CM | POA: Diagnosis not present

## 2016-03-14 DIAGNOSIS — E118 Type 2 diabetes mellitus with unspecified complications: Secondary | ICD-10-CM

## 2016-03-14 DIAGNOSIS — I5189 Other ill-defined heart diseases: Secondary | ICD-10-CM

## 2016-03-14 DIAGNOSIS — I10 Essential (primary) hypertension: Secondary | ICD-10-CM | POA: Diagnosis not present

## 2016-03-14 DIAGNOSIS — E662 Morbid (severe) obesity with alveolar hypoventilation: Secondary | ICD-10-CM

## 2016-03-14 DIAGNOSIS — I5031 Acute diastolic (congestive) heart failure: Secondary | ICD-10-CM

## 2016-03-14 LAB — BASIC METABOLIC PANEL
BUN: 24 mg/dL — AB (ref 6–23)
CHLORIDE: 102 meq/L (ref 96–112)
CO2: 27 mEq/L (ref 19–32)
Calcium: 9.1 mg/dL (ref 8.4–10.5)
Creatinine, Ser: 1.15 mg/dL (ref 0.40–1.20)
GFR: 60.39 mL/min (ref >60.00)
GLUCOSE: 105 mg/dL — AB (ref 70–99)
POTASSIUM: 4.1 meq/L (ref 3.5–5.1)
Sodium: 136 mEq/L (ref 135–145)

## 2016-03-14 LAB — HEMOGLOBIN A1C: HEMOGLOBIN A1C: 6.1 % (ref 4.6–6.5)

## 2016-03-14 LAB — BRAIN NATRIURETIC PEPTIDE: Pro B Natriuretic peptide (BNP): 35 pg/mL (ref 0.0–100.0)

## 2016-03-14 NOTE — Progress Notes (Signed)
Subjective:  Patient ID: Janice Morrow, female    DOB: 04/08/1946  Age: 70 y.o. MRN: [id number]  CC: Diabetes; COPD; and Congestive Heart Failure   HPI Janice Morrow presents for follow-up on the above problems. She has no new complaints. She tells me that she is now on continuous O2 for COPD and congestive heart failure. Her shortness of breath and dyspnea on exertion are at baseline and has not worsened recently. She complains of chronic, intermittent episodes of dizziness. She has mild edema in her lower extremities that is not worsened over the last few months. She has had no recent episodes of chest pain/palpitations/weight gain/dysuria. She has noticed a significant improvement in her breathing with Anoro and has had no recent episodes of coughing or wheezing.  Outpatient Prescriptions Prior to Visit  Medication Sig Dispense Refill  . furosemide (LASIX) 40 MG tablet Take 1 tablet (40 mg total) by mouth daily. 30 tablet 0  . HYDROcodone-acetaminophen (NORCO) 10-325 MG tablet Take 1 tablet by mouth every 6 (six) hours as needed. 90 tablet 0  . metoprolol tartrate (LOPRESSOR) 25 MG tablet Take 1 tablet (25 mg total) by mouth 2 (two) times daily. 60 tablet 0  . Umeclidinium-Vilanterol (ANORO ELLIPTA) 62.5-25 MCG/INH AEPB Inhale 1 puff into the lungs daily. 30 each 11  . rizatriptan (MAXALT) 10 MG tablet Take 1 tablet (10 mg total) by mouth as needed for migraine. May repeat in 2 hours if needed (Patient not taking: Reported on 03/14/2016) 10 tablet 3  . aspirin EC 81 MG tablet Take 1 tablet (81 mg total) by mouth daily. 30 tablet 0  . atorvastatin (LIPITOR) 20 MG tablet Take 1 tablet (20 mg total) by mouth daily. 90 tablet 3  . carbamazepine (TEGRETOL-XR) 100 MG 12 hr tablet Take 1 tablet (100 mg total) by mouth 2 (two) times daily. 30 tablet 5  . cyclobenzaprine (FLEXERIL) 5 MG tablet     . gabapentin (NEURONTIN) 300 MG capsule Take 300 mg by mouth 2 (two) times daily. Reported on  11/22/2015  2  . isosorbide mononitrate (IMDUR) 30 MG 24 hr tablet Take 1 tablet (30 mg total) by mouth daily. 90 tablet 1  . pantoprazole (PROTONIX) 40 MG tablet Take 1 tablet (40 mg total) by mouth daily. Switch for any other PPI at similar dose and frequency 30 tablet 3   No facility-administered medications prior to visit.    ROS Review of Systems  Constitutional: Positive for fatigue. Negative for chills, activity change, appetite change and unexpected weight change.  HENT: Negative.  Negative for sinus pressure.   Eyes: Negative.  Negative for visual disturbance.  Respiratory: Positive for shortness of breath. Negative for apnea, cough, choking, chest tightness, wheezing and stridor.   Cardiovascular: Positive for leg swelling. Negative for chest pain and palpitations.  Gastrointestinal: Negative.  Negative for nausea, vomiting, abdominal pain and diarrhea.  Endocrine: Negative.   Genitourinary: Negative.   Musculoskeletal: Negative.  Negative for back pain and neck pain.  Skin: Negative.  Negative for color change and rash.  Allergic/Immunologic: Negative.   Neurological: Positive for dizziness. Negative for syncope, weakness and light-headedness.  Hematological: Negative.  Negative for adenopathy. Does not bruise/bleed easily.  Psychiatric/Behavioral: Negative.     Objective:  BP 124/80 mmHg  Pulse 84  Temp(Src) 98.7 F (37.1 C) (Oral)  Resp 16  Ht 5\' 5"  (1.651 m)  Wt 317 lb (143.79 kg)  BMI 52.75 kg/m2  SpO2 87%  BP Readings from Last  3 Encounters:  03/14/16 124/80  11/29/15 119/66  11/22/15 124/60    Wt Readings from Last 3 Encounters:  03/14/16 317 lb (143.79 kg)  11/29/15 315 lb (142.883 kg)  11/22/15 315 lb (142.883 kg)    Physical Exam  Constitutional: She is oriented to person, place, and time. No distress.  HENT:  Mouth/Throat: Oropharynx is clear and moist. No oropharyngeal exudate.  Eyes: Conjunctivae are normal. Right eye exhibits no discharge.  Left eye exhibits no discharge. No scleral icterus.  Neck: Normal range of motion. Neck supple. No JVD present. No tracheal deviation present. No thyromegaly present.  Cardiovascular: Normal rate, regular rhythm, normal heart sounds and intact distal pulses.  Exam reveals no gallop and no friction rub.   No murmur heard. Pulmonary/Chest: Effort normal and breath sounds normal. No stridor. No respiratory distress. She has no wheezes. She has no rales. She exhibits no tenderness.  Abdominal: Soft. Bowel sounds are normal. She exhibits no distension and no mass. There is no tenderness. There is no rebound and no guarding.  Musculoskeletal: Normal range of motion. She exhibits edema (trace pitting edema in bilateral lower extremities). She exhibits no tenderness.  Lymphadenopathy:    She has no cervical adenopathy.  Neurological: She is oriented to person, place, and time.  Skin: Skin is warm and dry. No rash noted. She is not diaphoretic. No erythema. No pallor.  Vitals reviewed.   Lab Results  Component Value Date   WBC 7.1 08/25/2015   HGB 12.9 08/25/2015   HCT 42.4 08/25/2015   PLT 267 08/25/2015   GLUCOSE 105* 03/14/2016   CHOL 151 09/15/2015   TRIG 119.0 09/15/2015   HDL 46.40 09/15/2015   LDLCALC 81 09/15/2015   ALT 14 08/25/2015   AST 15 08/25/2015   NA 136 03/14/2016   K 4.1 03/14/2016   CL 102 03/14/2016   CREATININE 1.15 03/14/2016   BUN 24* 03/14/2016   CO2 27 03/14/2016   TSH 2.39 09/15/2015   INR 1.12 08/22/2014   HGBA1C 6.1 03/14/2016    Dg Chest 2 View  08/24/2015  CLINICAL DATA:  Cough and phlegm.  LEFT-sided back and chest pain. EXAM: CHEST  2 VIEW COMPARISON:  11/20/2014. FINDINGS: Cardiac enlargement. Interstitial prominence suggesting early edema or diffuse infection. No lobar consolidation. Pulmonary arterial prominence suggesting PAH. No effusion or pneumothorax. No acute osseous findings. IMPRESSION: Cardiomegaly with suspected with pulmonary arterial  hypertension. These changes appear worse compared with March 2016. Diffuse interstitial prominence of the lung fields. Early edema or viral infection not excluded. No lobar consolidation. Electronically Signed   By: Staci Righter M.D.   On: 08/24/2015 10:14    Assessment & Plan:   Adileny was seen today for diabetes, copd and congestive heart failure.  Diagnoses and all orders for this visit:  Essential hypertension- her blood pressure is well-controlled, electrolytes and renal function are stable. -     Basic metabolic panel; Future  Type 2 diabetes mellitus with complication, without long-term current use of insulin ([place])- her A1c is down to 6.1%, her blood sugars are well-controlled. -     Basic metabolic panel; Future -     Hemoglobin A1c; Future  Left ventricular diastolic dysfunction, NYHA class 1- her symptoms are stable on the current medical regimen and continuous oxygen, her exam today shows a normal volume status and her BNP is normal at 35. -     Basic metabolic panel; Future  Acute diastolic CHF (congestive heart failure) ([place])- see note  above, the acute diastolic heart failure has resolved. -     Brain natriuretic peptide; Future  Morbid obesity with alveolar hypoventilation ([facility])-she is working on her lifestyle modifications to help her lose weight.   I have discontinued Ms. Kraker's aspirin EC, pantoprazole, carbamazepine, atorvastatin, isosorbide mononitrate, and cyclobenzaprine. I am also having her maintain her umeclidinium-vilanterol, rizatriptan, metoprolol tartrate, furosemide, HYDROcodone-acetaminophen, tiZANidine, and gabapentin.  Meds ordered this encounter  Medications  . tiZANidine (ZANAFLEX) 4 MG tablet    Sig: 1 TABLET TWICE A DAY AS NEEDED    Refill:  2  . gabapentin (NEURONTIN) 400 MG capsule    Sig: TAKE 1 CAPSULE AT LUNCH, TAKE 2 CAPSULES AT SUPPER AND 3 CAPSULES AT BEDTIME    Refill:  0     Follow-up: Return in about 4 months (around  07/15/2016).  [name], MD

## 2016-03-14 NOTE — Progress Notes (Signed)
Pre visit review using our clinic review tool, if applicable. No additional management support is needed unless otherwise documented below in the visit note. 

## 2016-03-14 NOTE — Patient Instructions (Signed)

## 2016-03-20 DIAGNOSIS — G894 Chronic pain syndrome: Secondary | ICD-10-CM | POA: Diagnosis not present

## 2016-03-20 DIAGNOSIS — Z79899 Other long term (current) drug therapy: Secondary | ICD-10-CM | POA: Diagnosis not present

## 2016-03-20 DIAGNOSIS — Z79891 Long term (current) use of opiate analgesic: Secondary | ICD-10-CM | POA: Diagnosis not present

## 2016-03-21 ENCOUNTER — Other Ambulatory Visit: Payer: Self-pay | Admitting: Internal Medicine

## 2016-03-21 DIAGNOSIS — J410 Simple chronic bronchitis: Secondary | ICD-10-CM

## 2016-04-17 DIAGNOSIS — G894 Chronic pain syndrome: Secondary | ICD-10-CM | POA: Diagnosis not present

## 2016-04-17 DIAGNOSIS — M5137 Other intervertebral disc degeneration, lumbosacral region: Secondary | ICD-10-CM | POA: Diagnosis not present

## 2016-04-17 DIAGNOSIS — Z79899 Other long term (current) drug therapy: Secondary | ICD-10-CM | POA: Diagnosis not present

## 2016-04-17 DIAGNOSIS — M47817 Spondylosis without myelopathy or radiculopathy, lumbosacral region: Secondary | ICD-10-CM | POA: Diagnosis not present

## 2016-04-17 DIAGNOSIS — M4696 Unspecified inflammatory spondylopathy, lumbar region: Secondary | ICD-10-CM | POA: Diagnosis not present

## 2016-04-17 DIAGNOSIS — Z79891 Long term (current) use of opiate analgesic: Secondary | ICD-10-CM | POA: Diagnosis not present

## 2016-05-10 DIAGNOSIS — Z79891 Long term (current) use of opiate analgesic: Secondary | ICD-10-CM | POA: Diagnosis not present

## 2016-05-10 DIAGNOSIS — G894 Chronic pain syndrome: Secondary | ICD-10-CM | POA: Diagnosis not present

## 2016-05-10 DIAGNOSIS — G609 Hereditary and idiopathic neuropathy, unspecified: Secondary | ICD-10-CM | POA: Diagnosis not present

## 2016-05-10 DIAGNOSIS — Z79899 Other long term (current) drug therapy: Secondary | ICD-10-CM | POA: Diagnosis not present

## 2016-05-10 DIAGNOSIS — F4542 Pain disorder with related psychological factors: Secondary | ICD-10-CM | POA: Diagnosis not present

## 2016-05-10 DIAGNOSIS — M792 Neuralgia and neuritis, unspecified: Secondary | ICD-10-CM | POA: Diagnosis not present

## 2016-05-15 DIAGNOSIS — G894 Chronic pain syndrome: Secondary | ICD-10-CM | POA: Diagnosis not present

## 2016-05-15 DIAGNOSIS — M4696 Unspecified inflammatory spondylopathy, lumbar region: Secondary | ICD-10-CM | POA: Diagnosis not present

## 2016-05-15 DIAGNOSIS — M47817 Spondylosis without myelopathy or radiculopathy, lumbosacral region: Secondary | ICD-10-CM | POA: Diagnosis not present

## 2016-05-15 DIAGNOSIS — M5137 Other intervertebral disc degeneration, lumbosacral region: Secondary | ICD-10-CM | POA: Diagnosis not present

## 2016-05-15 DIAGNOSIS — Z79891 Long term (current) use of opiate analgesic: Secondary | ICD-10-CM | POA: Diagnosis not present

## 2016-05-15 DIAGNOSIS — Z79899 Other long term (current) drug therapy: Secondary | ICD-10-CM | POA: Diagnosis not present

## 2016-06-06 ENCOUNTER — Ambulatory Visit: Payer: Medicare Other | Admitting: Neurology

## 2016-06-13 DIAGNOSIS — G894 Chronic pain syndrome: Secondary | ICD-10-CM | POA: Diagnosis not present

## 2016-06-13 DIAGNOSIS — M5137 Other intervertebral disc degeneration, lumbosacral region: Secondary | ICD-10-CM | POA: Diagnosis not present

## 2016-06-13 DIAGNOSIS — M47817 Spondylosis without myelopathy or radiculopathy, lumbosacral region: Secondary | ICD-10-CM | POA: Diagnosis not present

## 2016-06-13 DIAGNOSIS — Z79891 Long term (current) use of opiate analgesic: Secondary | ICD-10-CM | POA: Diagnosis not present

## 2016-06-13 DIAGNOSIS — Z79899 Other long term (current) drug therapy: Secondary | ICD-10-CM | POA: Diagnosis not present

## 2016-06-13 DIAGNOSIS — M25569 Pain in unspecified knee: Secondary | ICD-10-CM | POA: Diagnosis not present

## 2016-07-05 ENCOUNTER — Ambulatory Visit: Payer: Medicare Other | Admitting: Neurology

## 2016-07-09 ENCOUNTER — Ambulatory Visit: Payer: Medicare Other | Admitting: Internal Medicine

## 2016-07-23 DIAGNOSIS — M25569 Pain in unspecified knee: Secondary | ICD-10-CM | POA: Diagnosis not present

## 2016-07-23 DIAGNOSIS — M4696 Unspecified inflammatory spondylopathy, lumbar region: Secondary | ICD-10-CM | POA: Diagnosis not present

## 2016-07-23 DIAGNOSIS — Z79891 Long term (current) use of opiate analgesic: Secondary | ICD-10-CM | POA: Diagnosis not present

## 2016-07-23 DIAGNOSIS — M5137 Other intervertebral disc degeneration, lumbosacral region: Secondary | ICD-10-CM | POA: Diagnosis not present

## 2016-07-23 DIAGNOSIS — Z79899 Other long term (current) drug therapy: Secondary | ICD-10-CM | POA: Diagnosis not present

## 2016-07-23 DIAGNOSIS — G894 Chronic pain syndrome: Secondary | ICD-10-CM | POA: Diagnosis not present

## 2016-07-24 ENCOUNTER — Ambulatory Visit: Payer: Medicare Other | Admitting: Internal Medicine

## 2016-07-25 ENCOUNTER — Telehealth: Payer: Self-pay | Admitting: Internal Medicine

## 2016-07-25 NOTE — Telephone Encounter (Signed)
Call her 

## 2016-07-25 NOTE — Telephone Encounter (Signed)
Patient no showed for medication follow up on 11/28.  Please advise.

## 2016-07-25 NOTE — Telephone Encounter (Signed)
Got patient scheduled

## 2016-07-31 ENCOUNTER — Other Ambulatory Visit (INDEPENDENT_AMBULATORY_CARE_PROVIDER_SITE_OTHER): Payer: Medicare Other

## 2016-07-31 ENCOUNTER — Ambulatory Visit (INDEPENDENT_AMBULATORY_CARE_PROVIDER_SITE_OTHER): Payer: Medicare Other | Admitting: Internal Medicine

## 2016-07-31 VITALS — BP 130/82 | HR 103 | Temp 98.4°F | Resp 16 | Ht 65.0 in | Wt 315.0 lb

## 2016-07-31 DIAGNOSIS — J449 Chronic obstructive pulmonary disease, unspecified: Secondary | ICD-10-CM

## 2016-07-31 DIAGNOSIS — R7303 Prediabetes: Secondary | ICD-10-CM

## 2016-07-31 DIAGNOSIS — I10 Essential (primary) hypertension: Secondary | ICD-10-CM

## 2016-07-31 DIAGNOSIS — I5189 Other ill-defined heart diseases: Secondary | ICD-10-CM

## 2016-07-31 DIAGNOSIS — Z86010 Personal history of colonic polyps: Secondary | ICD-10-CM

## 2016-07-31 DIAGNOSIS — Z23 Encounter for immunization: Secondary | ICD-10-CM | POA: Diagnosis not present

## 2016-07-31 DIAGNOSIS — I519 Heart disease, unspecified: Secondary | ICD-10-CM

## 2016-07-31 DIAGNOSIS — Z1231 Encounter for screening mammogram for malignant neoplasm of breast: Secondary | ICD-10-CM

## 2016-07-31 LAB — CBC WITH DIFFERENTIAL/PLATELET
BASOS ABS: 0.1 10*3/uL (ref 0.0–0.1)
Basophils Relative: 0.7 % (ref 0.0–3.0)
EOS ABS: 0.1 10*3/uL (ref 0.0–0.7)
Eosinophils Relative: 0.6 % (ref 0.0–5.0)
HEMATOCRIT: 40.7 % (ref 36.0–46.0)
Hemoglobin: 13.5 g/dL (ref 12.0–15.0)
LYMPHS PCT: 26.9 % (ref 12.0–46.0)
Lymphs Abs: 3 10*3/uL (ref 0.7–4.0)
MCHC: 33.3 g/dL (ref 30.0–36.0)
MCV: 85 fl (ref 78.0–100.0)
MONOS PCT: 9.5 % (ref 3.0–12.0)
Monocytes Absolute: 1.1 10*3/uL — ABNORMAL HIGH (ref 0.1–1.0)
NEUTROS PCT: 62.3 % (ref 43.0–77.0)
Neutro Abs: 7 10*3/uL (ref 1.4–7.7)
PLATELETS: 130 10*3/uL — AB (ref 150.0–400.0)
RBC: 4.78 Mil/uL (ref 3.87–5.11)
RDW: 16.3 % — ABNORMAL HIGH (ref 11.5–15.5)
WBC: 11.2 10*3/uL — ABNORMAL HIGH (ref 4.0–10.5)

## 2016-07-31 LAB — BASIC METABOLIC PANEL
BUN: 13 mg/dL (ref 6–23)
CALCIUM: 9.4 mg/dL (ref 8.4–10.5)
CO2: 29 mEq/L (ref 19–32)
CREATININE: 1 mg/dL (ref 0.40–1.20)
Chloride: 105 mEq/L (ref 96–112)
GFR: 70.88 mL/min (ref >60.00)
Glucose, Bld: 120 mg/dL — ABNORMAL HIGH (ref 70–99)
Potassium: 4.3 mEq/L (ref 3.5–5.1)
SODIUM: 140 meq/L (ref 135–145)

## 2016-07-31 MED ORDER — FUROSEMIDE 40 MG PO TABS: 40.0000 mg | ORAL_TABLET | Freq: Every day | ORAL | 1 refills | Status: DC

## 2016-07-31 NOTE — Progress Notes (Signed)
Subjective:  Patient ID: Janice Morrow, female    DOB: 09/07/1945  Age: 70 y.o. MRN: [id number]  CC: Hypertension and Congestive Heart Failure   HPI Janice Morrow presents for follow-up on hypertension and diastolic heart failure. She stopped taking metoprolol because she says it made her feel fatigued. She otherwise feels well and offers no complaints. She has had no recent episodes of chest pain, SOB, DOE, edema, palpitations, or syncope.  He tells me her breathing is well-controlled with the LABA/LAMA combination. She denies any recent episodes of wheezing, shortness of breath, or productive cough.  Outpatient Medications Prior to Visit  Medication Sig Dispense Refill  . ANORO ELLIPTA 62.5-25 MCG/INH AEPB INHALE 1 PUFF INTO THE LUNGS DAILY. 60 each 11  . gabapentin (NEURONTIN) 400 MG capsule TAKE 1 CAPSULE AT LUNCH, TAKE 2 CAPSULES AT SUPPER AND 3 CAPSULES AT BEDTIME  0  . HYDROcodone-acetaminophen (NORCO) 10-325 MG tablet Take 1 tablet by mouth every 6 (six) hours as needed. 90 tablet 0  . tiZANidine (ZANAFLEX) 4 MG tablet 1 TABLET TWICE A DAY AS NEEDED  2  . furosemide (LASIX) 40 MG tablet Take 1 tablet (40 mg total) by mouth daily. 30 tablet 0  . metoprolol tartrate (LOPRESSOR) 25 MG tablet Take 1 tablet (25 mg total) by mouth 2 (two) times daily. 60 tablet 0  . rizatriptan (MAXALT) 10 MG tablet Take 1 tablet (10 mg total) by mouth as needed for migraine. May repeat in 2 hours if needed (Patient not taking: Reported on 03/14/2016) 10 tablet 3   No facility-administered medications prior to visit.     ROS Review of Systems  Constitutional: Negative.  Negative for activity change, chills, diaphoresis, fatigue and unexpected weight change.  HENT: Negative.  Negative for sinus pressure and trouble swallowing.   Eyes: Negative.  Negative for visual disturbance.  Respiratory: Negative for cough, choking, chest tightness, shortness of breath and stridor.   Cardiovascular: Negative  for chest pain, palpitations and leg swelling.  Gastrointestinal: Negative for abdominal pain, constipation, diarrhea, nausea and vomiting.  Endocrine: Negative.   Genitourinary: Negative.   Musculoskeletal: Positive for back pain. Negative for myalgias and neck pain.  Skin: Negative.   Allergic/Immunologic: Negative.   Neurological: Negative.  Negative for dizziness, syncope, weakness and light-headedness.  Hematological: Negative.  Negative for adenopathy. Does not bruise/bleed easily.  Psychiatric/Behavioral: Negative.     Objective:  BP 130/82 (BP Location: Left Arm, Patient Position: Sitting, Cuff Size: Large)   Pulse (!) 103   Temp 98.4 F (36.9 C) (Oral)   Resp 16   Ht 5\' 5"  (1.651 m)   Wt (!) 315 lb (142.9 kg)   SpO2 90%   BMI 52.42 kg/m   BP Readings from Last 3 Encounters:  07/31/16 130/82  03/14/16 124/80  11/29/15 119/66    Wt Readings from Last 3 Encounters:  07/31/16 (!) 315 lb (142.9 kg)  03/14/16 (!) 317 lb (143.8 kg)  11/29/15 (!) 315 lb (142.9 kg)    Physical Exam  Constitutional: She is oriented to person, place, and time. No distress.  HENT:  Mouth/Throat: Oropharynx is clear and moist. No oropharyngeal exudate.  Eyes: Conjunctivae are normal. Right eye exhibits no discharge. Left eye exhibits no discharge. No scleral icterus.  Neck: Normal range of motion. Neck supple. No JVD present. No tracheal deviation present. No thyromegaly present.  Cardiovascular: Normal rate, regular rhythm, normal heart sounds and intact distal pulses.  Exam reveals no gallop and no friction  rub.   No murmur heard. Pulmonary/Chest: Effort normal and breath sounds normal. No stridor. No respiratory distress. She has no wheezes. She has no rales. She exhibits no tenderness.  Abdominal: Soft. Bowel sounds are normal. She exhibits no distension and no mass. There is no tenderness. There is no rebound and no guarding.  Musculoskeletal: Normal range of motion. She exhibits no  edema, tenderness or deformity.  Lymphadenopathy:    She has no cervical adenopathy.  Neurological: She is oriented to person, place, and time.  Skin: Skin is warm and dry. No rash noted. She is not diaphoretic. No erythema. No pallor.  Vitals reviewed.   Lab Results  Component Value Date   WBC 7.1 08/25/2015   HGB 12.9 08/25/2015   HCT 42.4 08/25/2015   PLT 267 08/25/2015   GLUCOSE 105 (H) 03/14/2016   CHOL 151 09/15/2015   TRIG 119.0 09/15/2015   HDL 46.40 09/15/2015   LDLCALC 81 09/15/2015   ALT 14 08/25/2015   AST 15 08/25/2015   NA 136 03/14/2016   K 4.1 03/14/2016   CL 102 03/14/2016   CREATININE 1.15 03/14/2016   BUN 24 (H) 03/14/2016   CO2 27 03/14/2016   TSH 2.39 09/15/2015   INR 1.12 08/22/2014   HGBA1C 6.1 03/14/2016    Dg Chest 2 View  Result Date: 08/24/2015 CLINICAL DATA:  Cough and phlegm.  LEFT-sided back and chest pain. EXAM: CHEST  2 VIEW COMPARISON:  11/20/2014. FINDINGS: Cardiac enlargement. Interstitial prominence suggesting early edema or diffuse infection. No lobar consolidation. Pulmonary arterial prominence suggesting PAH. No effusion or pneumothorax. No acute osseous findings. IMPRESSION: Cardiomegaly with suspected with pulmonary arterial hypertension. These changes appear worse compared with March 2016. Diffuse interstitial prominence of the lung fields. Early edema or viral infection not excluded. No lobar consolidation. Electronically Signed   By: Janice Morrow M.D.   On: 08/24/2015 10:14    Assessment & Plan:   Janice Morrow was seen today for hypertension and congestive heart failure.  Diagnoses and all orders for this visit:  Essential hypertension- her blood pressures well controlled, I will monitor her lites and renal function. -     Basic metabolic panel; Future -     CBC with Differential/Platelet; Future -     Hemoglobin A1c; Future -     furosemide (LASIX) 40 MG tablet; Take 1 tablet (40 mg total) by mouth daily.  Chronic obstructive  pulmonary disease, unspecified COPD type ([place])- She is doing well on Anoo, will continue.  Visit for screening mammogram -     MM DIGITAL SCREENING BILATERAL; Future  Severe obesity (BMI >= 40) ([place])- she agrees to work on her lifestyle modifications to help her lose weight.  Prediabetes- I'll monitor her blood sugar to see if she has developed type 2 diabetes and needs to be treated with the medication. -     Hemoglobin A1c; Future  History of colonic polyps -     Ambulatory referral to Gastroenterology  Left ventricular diastolic dysfunction, NYHA class 1- she has a normal volume status, will continue the loop diuretic. She is not willing to restart the beta blocker. -     furosemide (LASIX) 40 MG tablet; Take 1 tablet (40 mg total) by mouth daily.  Other orders -     Pneumococcal conjugate vaccine 13-valent   I have discontinued Ms. Clay's rizatriptan and metoprolol tartrate. I am also having her maintain her HYDROcodone-acetaminophen, tiZANidine, gabapentin, ANORO ELLIPTA, and furosemide.  Meds ordered this encounter  Medications  . furosemide (LASIX) 40 MG tablet    Sig: Take 1 tablet (40 mg total) by mouth daily.    Dispense:  90 tablet    Refill:  1     Follow-up: Return in about 6 months (around 01/29/2017).  [name], MD

## 2016-07-31 NOTE — Progress Notes (Signed)
Pre visit review using our clinic review tool, if applicable. No additional management support is needed unless otherwise documented below in the visit note. 

## 2016-07-31 NOTE — Patient Instructions (Signed)
Hypertension Hypertension, commonly called high blood pressure, is when the force of blood pumping through your arteries is too strong. Your arteries are the blood vessels that carry blood from your heart throughout your body. A blood pressure reading consists of a higher number over a lower number, such as 110/72. The higher number (systolic) is the pressure inside your arteries when your heart pumps. The lower number (diastolic) is the pressure inside your arteries when your heart relaxes. Ideally you want your blood pressure below 120/80. Hypertension forces your heart to work harder to pump blood. Your arteries may become narrow or stiff. Having untreated or uncontrolled hypertension can cause heart attack, stroke, kidney disease, and other problems. What increases the risk? Some risk factors for high blood pressure are controllable. Others are not. Risk factors you cannot control include:  Race. You may be at higher risk if you are African American.  Age. Risk increases with age.  Gender. Men are at higher risk than women before age 45 years. After age 65, women are at higher risk than men. Risk factors you can control include:  Not getting enough exercise or physical activity.  Being overweight.  Getting too much fat, sugar, calories, or salt in your diet.  Drinking too much alcohol. What are the signs or symptoms? Hypertension does not usually cause signs or symptoms. Extremely high blood pressure (hypertensive crisis) may cause headache, anxiety, shortness of breath, and nosebleed. How is this diagnosed? To check if you have hypertension, your health care provider will measure your blood pressure while you are seated, with your arm held at the level of your heart. It should be measured at least twice using the same arm. Certain conditions can cause a difference in blood pressure between your right and left arms. A blood pressure reading that is higher than normal on one occasion does  not mean that you need treatment. If it is not clear whether you have high blood pressure, you may be asked to return on a different day to have your blood pressure checked again. Or, you may be asked to monitor your blood pressure at home for 1 or more weeks. How is this treated? Treating high blood pressure includes making lifestyle changes and possibly taking medicine. Living a healthy lifestyle can help lower high blood pressure. You may need to change some of your habits. Lifestyle changes may include:  Following the DASH diet. This diet is high in fruits, vegetables, and whole grains. It is low in salt, red meat, and added sugars.  Keep your sodium intake below 2,300 mg per day.  Getting at least 30-45 minutes of aerobic exercise at least 4 times per week.  Losing weight if necessary.  Not smoking.  Limiting alcoholic beverages.  Learning ways to reduce stress. Your health care provider may prescribe medicine if lifestyle changes are not enough to get your blood pressure under control, and if one of the following is true:  You are 18-59 years of age and your systolic blood pressure is above 140.  You are 60 years of age or older, and your systolic blood pressure is above 150.  Your diastolic blood pressure is above 90.  You have diabetes, and your systolic blood pressure is over 140 or your diastolic blood pressure is over 90.  You have kidney disease and your blood pressure is above 140/90.  You have heart disease and your blood pressure is above 140/90. Your personal target blood pressure may vary depending on your medical   conditions, your age, and other factors. Follow these instructions at home:  Have your blood pressure rechecked as directed by your health care provider.  Take medicines only as directed by your health care provider. Follow the directions carefully. Blood pressure medicines must be taken as prescribed. The medicine does not work as well when you skip  doses. Skipping doses also puts you at risk for problems.  Do not smoke.  Monitor your blood pressure at home as directed by your health care provider. Contact a health care provider if:  You think you are having a reaction to medicines taken.  You have recurrent headaches or feel dizzy.  You have swelling in your ankles.  You have trouble with your vision. Get help right away if:  You develop a severe headache or confusion.  You have unusual weakness, numbness, or feel faint.  You have severe chest or abdominal pain.  You vomit repeatedly.  You have trouble breathing. This information is not intended to replace advice given to you by your health care provider. Make sure you discuss any questions you have with your health care provider. Document Released: 08/13/2005 Document Revised: 01/19/2016 Document Reviewed: 06/05/2013 Elsevier Interactive Patient Education  2017 Elsevier Inc.  

## 2016-08-01 ENCOUNTER — Encounter: Payer: Self-pay | Admitting: Internal Medicine

## 2016-08-01 LAB — HEMOGLOBIN A1C: Hgb A1c MFr Bld: 6.3 % (ref 4.6–6.5)

## 2016-08-22 DIAGNOSIS — G894 Chronic pain syndrome: Secondary | ICD-10-CM | POA: Diagnosis not present

## 2016-08-22 DIAGNOSIS — Z79899 Other long term (current) drug therapy: Secondary | ICD-10-CM | POA: Diagnosis not present

## 2016-08-22 DIAGNOSIS — M4696 Unspecified inflammatory spondylopathy, lumbar region: Secondary | ICD-10-CM | POA: Diagnosis not present

## 2016-08-22 DIAGNOSIS — Z79891 Long term (current) use of opiate analgesic: Secondary | ICD-10-CM | POA: Diagnosis not present

## 2016-08-22 DIAGNOSIS — M47817 Spondylosis without myelopathy or radiculopathy, lumbosacral region: Secondary | ICD-10-CM | POA: Diagnosis not present

## 2016-08-22 DIAGNOSIS — M5137 Other intervertebral disc degeneration, lumbosacral region: Secondary | ICD-10-CM | POA: Diagnosis not present

## 2016-09-11 ENCOUNTER — Telehealth: Payer: Self-pay

## 2016-09-11 ENCOUNTER — Ambulatory Visit: Payer: Medicare Other | Admitting: Neurology

## 2016-09-11 NOTE — Telephone Encounter (Signed)
Late Entry from 09/10/2016  Rn has tried to call patients listed number on 09/10/2016 several times to r/s appt for today. The Md will be out. When the phone is call it goes straight to vm. The vm is full and messages cannot be left.  Rn tried to call this am at 0915 over three times. The number states vm is full unable to leave messages. If patient shows up they will need to r/s with Dr. Leonie Man.

## 2016-09-19 ENCOUNTER — Telehealth: Payer: Self-pay | Admitting: Internal Medicine

## 2016-09-19 NOTE — Telephone Encounter (Signed)
Patient called in requesting labs results from December.  I told her I could get office to call back with those results.  Patient states she did not want call back.  That she would get the results when she comes back in in June.

## 2016-09-21 NOTE — Telephone Encounter (Signed)
Pt is requesting results.  

## 2016-09-25 DIAGNOSIS — Z79891 Long term (current) use of opiate analgesic: Secondary | ICD-10-CM | POA: Diagnosis not present

## 2016-09-25 DIAGNOSIS — M25569 Pain in unspecified knee: Secondary | ICD-10-CM | POA: Diagnosis not present

## 2016-09-25 DIAGNOSIS — M5137 Other intervertebral disc degeneration, lumbosacral region: Secondary | ICD-10-CM | POA: Diagnosis not present

## 2016-09-25 DIAGNOSIS — G894 Chronic pain syndrome: Secondary | ICD-10-CM | POA: Diagnosis not present

## 2016-09-25 DIAGNOSIS — M47817 Spondylosis without myelopathy or radiculopathy, lumbosacral region: Secondary | ICD-10-CM | POA: Diagnosis not present

## 2016-09-25 DIAGNOSIS — Z79899 Other long term (current) drug therapy: Secondary | ICD-10-CM | POA: Diagnosis not present

## 2016-10-11 ENCOUNTER — Encounter: Payer: Self-pay | Admitting: Internal Medicine

## 2016-10-23 DIAGNOSIS — M4696 Unspecified inflammatory spondylopathy, lumbar region: Secondary | ICD-10-CM | POA: Diagnosis not present

## 2016-10-23 DIAGNOSIS — M5137 Other intervertebral disc degeneration, lumbosacral region: Secondary | ICD-10-CM | POA: Diagnosis not present

## 2016-10-23 DIAGNOSIS — M47817 Spondylosis without myelopathy or radiculopathy, lumbosacral region: Secondary | ICD-10-CM | POA: Diagnosis not present

## 2016-10-23 DIAGNOSIS — Z79899 Other long term (current) drug therapy: Secondary | ICD-10-CM | POA: Diagnosis not present

## 2016-10-23 DIAGNOSIS — G894 Chronic pain syndrome: Secondary | ICD-10-CM | POA: Diagnosis not present

## 2016-10-23 DIAGNOSIS — Z79891 Long term (current) use of opiate analgesic: Secondary | ICD-10-CM | POA: Diagnosis not present

## 2016-11-16 ENCOUNTER — Other Ambulatory Visit: Payer: Self-pay | Admitting: Physician Assistant

## 2016-11-16 ENCOUNTER — Ambulatory Visit
Admission: RE | Admit: 2016-11-16 | Discharge: 2016-11-16 | Disposition: A | Discharge status: Home / Self Care | Payer: Medicare Other | Source: Ambulatory Visit | Attending: Physician Assistant | Admitting: Physician Assistant

## 2016-11-16 DIAGNOSIS — G8929 Other chronic pain: Secondary | ICD-10-CM

## 2016-11-16 DIAGNOSIS — M1711 Unilateral primary osteoarthritis, right knee: Secondary | ICD-10-CM | POA: Diagnosis not present

## 2016-11-16 DIAGNOSIS — M25561 Pain in right knee: Principal | ICD-10-CM

## 2016-11-30 ENCOUNTER — Emergency Department (HOSPITAL_COMMUNITY): Payer: Medicare Other

## 2016-11-30 ENCOUNTER — Inpatient Hospital Stay (HOSPITAL_COMMUNITY)
Admission: EM | Admit: 2016-11-30 | Discharge: 2016-12-04 | DRG: 291 | Disposition: A | Discharge status: Home / Self Care | Payer: Medicare Other | Attending: Internal Medicine | Admitting: Internal Medicine

## 2016-11-30 ENCOUNTER — Encounter (HOSPITAL_COMMUNITY): Payer: Self-pay | Admitting: Emergency Medicine

## 2016-11-30 DIAGNOSIS — J440 Chronic obstructive pulmonary disease with acute lower respiratory infection: Secondary | ICD-10-CM | POA: Diagnosis not present

## 2016-11-30 DIAGNOSIS — Z6841 Body Mass Index (BMI) 40.0 and over, adult: Secondary | ICD-10-CM | POA: Diagnosis not present

## 2016-11-30 DIAGNOSIS — Z7951 Long term (current) use of inhaled steroids: Secondary | ICD-10-CM

## 2016-11-30 DIAGNOSIS — R05 Cough: Secondary | ICD-10-CM | POA: Diagnosis not present

## 2016-11-30 DIAGNOSIS — R0902 Hypoxemia: Secondary | ICD-10-CM

## 2016-11-30 DIAGNOSIS — I5033 Acute on chronic diastolic (congestive) heart failure: Secondary | ICD-10-CM | POA: Diagnosis not present

## 2016-11-30 DIAGNOSIS — R059 Cough, unspecified: Secondary | ICD-10-CM

## 2016-11-30 DIAGNOSIS — R0602 Shortness of breath: Secondary | ICD-10-CM | POA: Diagnosis not present

## 2016-11-30 DIAGNOSIS — Z9989 Dependence on other enabling machines and devices: Secondary | ICD-10-CM

## 2016-11-30 DIAGNOSIS — R079 Chest pain, unspecified: Secondary | ICD-10-CM

## 2016-11-30 DIAGNOSIS — Z9119 Patient's noncompliance with other medical treatment and regimen: Secondary | ICD-10-CM

## 2016-11-30 DIAGNOSIS — I130 Hypertensive heart and chronic kidney disease with heart failure and stage 1 through stage 4 chronic kidney disease, or unspecified chronic kidney disease: Secondary | ICD-10-CM | POA: Diagnosis not present

## 2016-11-30 DIAGNOSIS — N200 Calculus of kidney: Secondary | ICD-10-CM

## 2016-11-30 DIAGNOSIS — I10 Essential (primary) hypertension: Secondary | ICD-10-CM | POA: Diagnosis present

## 2016-11-30 DIAGNOSIS — R109 Unspecified abdominal pain: Secondary | ICD-10-CM

## 2016-11-30 DIAGNOSIS — Z9109 Other allergy status, other than to drugs and biological substances: Secondary | ICD-10-CM

## 2016-11-30 DIAGNOSIS — R058 Other specified cough: Secondary | ICD-10-CM

## 2016-11-30 DIAGNOSIS — G4733 Obstructive sleep apnea (adult) (pediatric): Secondary | ICD-10-CM

## 2016-11-30 DIAGNOSIS — Z79899 Other long term (current) drug therapy: Secondary | ICD-10-CM

## 2016-11-30 DIAGNOSIS — J209 Acute bronchitis, unspecified: Secondary | ICD-10-CM | POA: Diagnosis present

## 2016-11-30 DIAGNOSIS — R069 Unspecified abnormalities of breathing: Secondary | ICD-10-CM | POA: Diagnosis not present

## 2016-11-30 DIAGNOSIS — G8929 Other chronic pain: Secondary | ICD-10-CM | POA: Diagnosis present

## 2016-11-30 DIAGNOSIS — J9601 Acute respiratory failure with hypoxia: Secondary | ICD-10-CM | POA: Diagnosis present

## 2016-11-30 DIAGNOSIS — K5900 Constipation, unspecified: Secondary | ICD-10-CM | POA: Diagnosis present

## 2016-11-30 DIAGNOSIS — G629 Polyneuropathy, unspecified: Secondary | ICD-10-CM | POA: Diagnosis present

## 2016-11-30 LAB — CBC
HCT: 48.7 % — ABNORMAL HIGH (ref 36.0–46.0)
Hemoglobin: 14.7 g/dL (ref 12.0–15.0)
MCH: 27.4 pg (ref 26.0–34.0)
MCHC: 30.2 g/dL (ref 30.0–36.0)
MCV: 90.9 fL (ref 78.0–100.0)
PLATELETS: 181 10*3/uL (ref 150–400)
RBC: 5.36 MIL/uL — AB (ref 3.87–5.11)
RDW: 15.5 % (ref 11.5–15.5)
WBC: 7 10*3/uL (ref 4.0–10.5)

## 2016-11-30 LAB — BASIC METABOLIC PANEL
Anion gap: 9 (ref 5–15)
BUN: 11 mg/dL (ref 6–20)
CALCIUM: 9.3 mg/dL (ref 8.9–10.3)
CHLORIDE: 104 mmol/L (ref 101–111)
CO2: 24 mmol/L (ref 22–32)
CREATININE: 1.02 mg/dL — AB (ref 0.44–1.00)
GFR calc Af Amer: >60 mL/min (ref >60)
GFR calc non Af Amer: 55 mL/min — ABNORMAL LOW (ref >60)
GLUCOSE: 118 mg/dL — AB (ref 65–99)
Potassium: 4.3 mmol/L (ref 3.5–5.1)
Sodium: 137 mmol/L (ref 135–145)

## 2016-11-30 LAB — TROPONIN I

## 2016-11-30 LAB — MAGNESIUM: MAGNESIUM: 2 mg/dL (ref 1.7–2.4)

## 2016-11-30 LAB — I-STAT TROPONIN, ED: TROPONIN I, POC: 0 ng/mL (ref 0.00–0.08)

## 2016-11-30 LAB — BRAIN NATRIURETIC PEPTIDE: B Natriuretic Peptide: 127.6 pg/mL — ABNORMAL HIGH (ref 0.0–100.0)

## 2016-11-30 MED ORDER — GABAPENTIN 400 MG PO CAPS
1200.0000 mg | ORAL_CAPSULE | Freq: Every day | ORAL | Status: DC
  Administered 2016-12-01 – 2016-12-03 (×4): 1200 mg via ORAL
  Filled 2016-11-30 (×4): qty 3

## 2016-11-30 MED ORDER — FUROSEMIDE 10 MG/ML IJ SOLN
40.0000 mg | Freq: Once | INTRAMUSCULAR | Status: AC
  Administered 2016-11-30: 40 mg via INTRAVENOUS
  Filled 2016-11-30: qty 4

## 2016-11-30 MED ORDER — ENOXAPARIN SODIUM 40 MG/0.4ML ~~LOC~~ SOLN
40.0000 mg | SUBCUTANEOUS | Status: DC
  Administered 2016-12-01 (×2): 40 mg via SUBCUTANEOUS
  Filled 2016-11-30 (×3): qty 0.4

## 2016-11-30 MED ORDER — TIZANIDINE HCL 4 MG PO TABS
4.0000 mg | ORAL_TABLET | Freq: Two times a day (BID) | ORAL | Status: DC | PRN
  Administered 2016-12-01: 4 mg via ORAL
  Filled 2016-11-30: qty 1

## 2016-11-30 MED ORDER — GABAPENTIN 400 MG PO CAPS
800.0000 mg | ORAL_CAPSULE | Freq: Every day | ORAL | Status: DC
  Administered 2016-12-01 – 2016-12-03 (×3): 800 mg via ORAL
  Filled 2016-11-30 (×3): qty 2

## 2016-11-30 MED ORDER — KETOROLAC TROMETHAMINE 30 MG/ML IJ SOLN
15.0000 mg | Freq: Once | INTRAMUSCULAR | Status: AC
  Administered 2016-11-30: 15 mg via INTRAVENOUS
  Filled 2016-11-30: qty 1

## 2016-11-30 MED ORDER — SODIUM CHLORIDE 0.9 % IV SOLN: 250.0000 mL | INTRAVENOUS | Status: DC | PRN

## 2016-11-30 MED ORDER — ONDANSETRON HCL 4 MG/2ML IJ SOLN: 4.0000 mg | Freq: Four times a day (QID) | INTRAMUSCULAR | Status: DC | PRN

## 2016-11-30 MED ORDER — LISINOPRIL 5 MG PO TABS
5.0000 mg | ORAL_TABLET | Freq: Every day | ORAL | Status: DC
  Administered 2016-12-01: 5 mg via ORAL
  Filled 2016-11-30: qty 1

## 2016-11-30 MED ORDER — SODIUM CHLORIDE 0.9% FLUSH
3.0000 mL | Freq: Two times a day (BID) | INTRAVENOUS | Status: DC
  Administered 2016-11-30 – 2016-12-04 (×8): 3 mL via INTRAVENOUS

## 2016-11-30 MED ORDER — ACETAMINOPHEN 325 MG PO TABS
650.0000 mg | ORAL_TABLET | ORAL | Status: DC | PRN
  Administered 2016-12-04: 650 mg via ORAL
  Filled 2016-11-30: qty 2

## 2016-11-30 MED ORDER — SODIUM CHLORIDE 0.9% FLUSH: 3.0000 mL | INTRAVENOUS | Status: DC | PRN

## 2016-11-30 MED ORDER — HYDROCODONE-ACETAMINOPHEN 10-325 MG PO TABS
1.0000 | ORAL_TABLET | Freq: Four times a day (QID) | ORAL | Status: DC | PRN
  Administered 2016-12-01 – 2016-12-02 (×4): 1 via ORAL
  Filled 2016-11-30 (×4): qty 1

## 2016-11-30 MED ORDER — GABAPENTIN 400 MG PO CAPS
400.0000 mg | ORAL_CAPSULE | Freq: Every day | ORAL | Status: DC
  Administered 2016-12-01 – 2016-12-03 (×3): 400 mg via ORAL
  Filled 2016-11-30 (×3): qty 1

## 2016-11-30 MED ORDER — UMECLIDINIUM-VILANTEROL 62.5-25 MCG/INH IN AEPB
1.0000 | INHALATION_SPRAY | Freq: Every day | RESPIRATORY_TRACT | Status: DC
  Administered 2016-12-02 – 2016-12-04 (×3): 1 via RESPIRATORY_TRACT
  Filled 2016-11-30: qty 14

## 2016-11-30 MED ORDER — AZITHROMYCIN 500 MG IV SOLR
500.0000 mg | Freq: Once | INTRAVENOUS | Status: AC
  Administered 2016-11-30: 500 mg via INTRAVENOUS
  Filled 2016-11-30: qty 500

## 2016-11-30 MED ORDER — GABAPENTIN 400 MG PO CAPS: 400.0000 mg | ORAL_CAPSULE | Freq: Three times a day (TID) | ORAL | Status: DC

## 2016-11-30 MED ORDER — FUROSEMIDE 10 MG/ML IJ SOLN
40.0000 mg | Freq: Every day | INTRAMUSCULAR | Status: DC
  Administered 2016-12-01: 40 mg via INTRAVENOUS
  Filled 2016-11-30: qty 4

## 2016-11-30 NOTE — ED Provider Notes (Signed)
[facility] DEPT Provider Note   CSN: [id number] Arrival date & time: 11/30/16  1945     History   Chief Complaint Chief Complaint  Patient presents with  . Chest Pain  . Back Pain    HPI Janice Morrow is a 71 y.o. female.  The history is provided by the patient.  Shortness of Breath  This is a new problem. The problem occurs continuously.The current episode started more than 2 days ago. The problem has been gradually worsening. Associated symptoms include a fever (subjective), cough, sputum production (yellowish), chest pain (hurts with coughing) and leg swelling. Pertinent negatives include no headaches, no coryza, no rhinorrhea, no sore throat, no swollen glands, no ear pain, no neck pain, no hemoptysis, no wheezing, no PND, no orthopnea, no syncope, no vomiting, no abdominal pain, no rash and no leg pain. Risk factors include recent leg injury. She has tried nothing for the symptoms. She has had prior hospitalizations. She has had no prior ED visits. Associated medical issues include COPD and heart failure. Associated medical issues do not include CAD or DVT.    Past Medical History:  Diagnosis Date  . Acute diastolic CHF (congestive heart failure) ([place]) 08/24/2015  . Cervicalgia   . Chronic back pain   . COPD (chronic obstructive pulmonary disease) ([place])   . Essential hypertension 08/24/2015  . GERD (gastroesophageal reflux disease)   . Headache(784.0)   . Heart murmur   . Hyperlipidemia   . Migraine without aura, with intractable migraine, so stated, without mention of status migrainosus   . OSA on CPAP   . Osteoarthritis   . PVD (peripheral vascular disease) ([place])   . PVD (peripheral vascular disease) ([place])   . Seizures ([place]) 07/2014, 10/2014  . Shortness of breath dyspnea   . Trigeminal neuralgia     Patient Active Problem List   Diagnosis Date Noted  . History of colonic polyps 07/31/2016  . Essential hypertension 08/24/2015  . Seizures ([place]) 08/24/2015  .  Obesity hypoventilation syndrome ([place]) 08/24/2015  . Migraine without aura and with status migrainosus, not intractable 07/12/2015  . OSA on CPAP 07/12/2015  . Left ventricular diastolic dysfunction, NYHA class 1 04/11/2015  . Prediabetes 04/11/2015  . Visit for screening mammogram 03/08/2015  . Trigeminal neuralgia of right side of face 11/23/2014  . Chronic back pain 11/26/2013  . Hyperlipidemia with target LDL less than 130 09/01/2010  . Severe obesity (BMI >= 40) ([place]) 09/01/2010  . COPD (chronic obstructive pulmonary disease) ([place]) 09/01/2010  . GERD 09/01/2010  . OSTEOARTHRITIS 09/01/2010  . CATARACT, CONGENITAL, LEFT 09/01/2010    Past Surgical History:  Procedure Laterality Date  . MULTIPLE TOOTH EXTRACTIONS     "they took out part of my teeth; I took out the rest when they got loose; was suppose to get dentures; never did"    OB History    No data available       Home Medications    Prior to Admission medications   Medication Sig Start Date End Date Taking? Authorizing Provider  ANORO ELLIPTA 62.5-25 MCG/INH AEPB INHALE 1 PUFF INTO THE LUNGS DAILY. 03/22/16   Janith Lima, MD  furosemide (LASIX) 40 MG tablet Take 1 tablet (40 mg total) by mouth daily. 07/31/16   Janith Lima, MD  gabapentin (NEURONTIN) 400 MG capsule TAKE 1 CAPSULE AT LUNCH, TAKE 2 CAPSULES AT SUPPER AND 3 CAPSULES AT BEDTIME 02/21/16   Historical Provider, MD  HYDROcodone-acetaminophen (NORCO) 10-325 MG tablet Take  1 tablet by mouth every 6 (six) hours as needed. 09/15/15   Janith Lima, MD  tiZANidine (ZANAFLEX) 4 MG tablet 1 TABLET TWICE A DAY AS NEEDED 02/21/16   Historical Provider, MD    Family History Family History  Problem Relation Age of Onset  . Heart attack Father   . Diabetes Mother   . Dementia Mother   . Diabetes Brother   . Heart disease    . Diabetes      Social History Social History  Substance Use Topics  . Smoking status: Former Smoker    Types: Cigarettes    Quit  date: 08/22/2010  . Smokeless tobacco: Never Used  . Alcohol use No     Allergies   Baclofen   Review of Systems Review of Systems  Constitutional: Positive for chills and fever (subjective).  HENT: Negative for ear pain, rhinorrhea and sore throat.   Eyes: Negative for pain and visual disturbance.  Respiratory: Positive for cough, sputum production (yellowish) and shortness of breath. Negative for hemoptysis and wheezing.   Cardiovascular: Positive for chest pain (hurts with coughing) and leg swelling. Negative for palpitations, orthopnea, syncope and PND.  Gastrointestinal: Negative for abdominal pain and vomiting.  Genitourinary: Negative for dysuria and hematuria.  Musculoskeletal: Negative for arthralgias, back pain and neck pain.  Skin: Negative for color change and rash.  Neurological: Negative for seizures, syncope and headaches.  All other systems reviewed and are negative.    Physical Exam Updated Vital Signs BP (!) 141/93   Pulse 77   Temp 98.9 F (37.2 C) (Oral)   Resp (!) 22   Ht 5\' 5"  (1.651 m)   Wt (!) 156.5 kg   SpO2 94%   BMI 57.41 kg/m   Physical Exam  Constitutional: She appears well-developed and well-nourished. She has a sickly appearance. No distress.  HENT:  Head: Normocephalic and atraumatic.  Eyes: Conjunctivae are normal.  Neck: Neck supple.  Cardiovascular: Normal rate and regular rhythm.   No murmur heard. Pulmonary/Chest: Effort normal and breath sounds normal. No respiratory distress.  Abdominal: Soft. There is no tenderness.  Musculoskeletal: She exhibits no edema.  Neurological: She is alert.  Skin: Skin is warm and dry.     Psychiatric: She has a normal mood and affect.  Nursing note and vitals reviewed.    ED Treatments / Results  Labs (all labs ordered are listed, but only abnormal results are displayed) Labs Reviewed  BASIC METABOLIC PANEL - Abnormal; Notable for the following:       Result Value   Glucose, Bld 118  (*)    Creatinine, Ser 1.02 (*)    GFR calc non Af Amer 55 (*)    All other components within normal limits  CBC - Abnormal; Notable for the following:    RBC 5.36 (*)    HCT 48.7 (*)    All other components within normal limits  BRAIN NATRIURETIC PEPTIDE - Abnormal; Notable for the following:    B Natriuretic Peptide 127.6 (*)    All other components within normal limits  MAGNESIUM  TROPONIN I  I-STAT TROPOININ, ED    EKG  EKG Interpretation  Date/Time:  Friday November 30 2016 19:58:50 EDT Ventricular Rate:  85 PR Interval:    QRS Duration: 93 QT Interval:  402 QTC Calculation: 478 R Axis:   -28 Text Interpretation:  Sinus rhythm Atrial premature complexes Borderline left axis deviation Nonspecific T abnrm, anterolateral leads No significant change since last tracing  Confirmed by [facility] MD, [place] [phone]) on 11/30/2016 8:11:09 PM       Radiology Dg Chest 2 View  Result Date: 11/30/2016 CLINICAL DATA:  Chest pain for 2 weeks, LEFT arm pain. History of CHF, COPD. EXAM: CHEST  2 VIEW COMPARISON:  Chest radiograph October 24, 2013 FINDINGS: The cardiac silhouette is moderately enlarged and unchanged. Similar chronic interstitial changes and pulmonary vascular congestion without pleural effusion or focal consolidation. Increased lung volumes with flattened hemidiaphragms. No pneumothorax. Severe degenerative change of the LEFT shoulder. Large body habitus. IMPRESSION: Stable cardiomegaly, pulmonary vascular congestion and COPD. Electronically Signed   By: Elon Alas M.D.   On: 11/30/2016 20:49    Procedures Procedures (including critical care time)  Medications Ordered in ED Medications  ketorolac (TORADOL) 30 MG/ML injection 15 mg (15 mg Intravenous Given 11/30/16 2012)     Initial Impression / Assessment and Plan / ED Course  I have reviewed the triage vital signs and the nursing notes.  Pertinent labs & imaging results that were available during my care of the patient  were reviewed by me and considered in my medical decision making (see chart for details).     71 year old female with history of COPD, CHF who presents in the setting of cough, chest pain, shortness of breath, fatigue. Patient reports this is been worsening over the last week. She reports chest pain is intermittent and pressure in her chest. She reports is worse with coughing. She reports she has had yellowish productive sputum.  On arrival patient with him and ultimately stable and afebrile. Patient was noted to have oxygen saturations in the mid 80s on room air. Patient reports she is not on home oxygen but does use CPAP at times at night. She reports her CPAP machine is broken. Oxygen saturations improved to the mid 90s with 2 L of supplemental oxygen. Chest x-ray was obtained which revealed no signs of pneumonia. BNP was slightly elevated patient's presentation could be related to CHF. Pt recently self discontinued lasix 2 weeks ago.  I have low suspicion for COPD exacerbation. No elevation in troponin and EKG without significant changes from prior. Patient without significant laboratory abnormalities. No wheezing noted on auscultation.  Due to patient's new oxygen requirement patient will require admission to the hospital for further evaluation. Patient stable at time of transfer of care.  Attending has seen and available inpatient and Dr. Leonette Monarch, is in agreement with plan.  Final Clinical Impressions(s) / ED Diagnoses   Final diagnoses:  Chest pain, unspecified type  SOB (shortness of breath)  Hypoxemia  Cough  Sputum production    New Prescriptions New Prescriptions   No medications on file     Esaw Grandchild, MD 11/30/16 2202

## 2016-11-30 NOTE — H&P (Signed)
History and Physical    Janice Morrow ACZ:[id number] DOB: 1946/07/20 DOA: 11/30/2016  PCP: [name], MD  Patient coming from: Home  I have personally briefly reviewed patient's old medical records in [place]  Chief Complaint: Chest pain, back pain  HPI: Janice Morrow is a 71 y.o. female with medical history significant of Diastolic CHF, COPD, OSA, HTN.  Patient presents to the ED with 1 week history of cough and chest pain worse with coughing.  Cough is productive of sputum.  Nothing makes better or worse.  Cough is persistent.  No fevers, chills, N/V/D.  Of note patient took herself off of her lasix 2 weeks ago.  ED Course: BNP 120, CXR shows pulm vascular congestion but no other acute process.  No wheezing and good air movement, patient is satting upper 80s on room air.   Review of Systems: As per HPI otherwise 10 point review of systems negative.   Past Medical History:  Diagnosis Date  . Acute diastolic CHF (congestive heart failure) ([place]) 08/24/2015  . Cervicalgia   . Chronic back pain   . COPD (chronic obstructive pulmonary disease) ([place])   . Essential hypertension 08/24/2015  . GERD (gastroesophageal reflux disease)   . Headache(784.0)   . Heart murmur   . Hyperlipidemia   . Migraine without aura, with intractable migraine, so stated, without mention of status migrainosus   . OSA on CPAP   . Osteoarthritis   . PVD (peripheral vascular disease) ([place])   . PVD (peripheral vascular disease) ([place])   . Seizures ([place]) 07/2014, 10/2014  . Shortness of breath dyspnea   . Trigeminal neuralgia     Past Surgical History:  Procedure Laterality Date  . MULTIPLE TOOTH EXTRACTIONS     "they took out part of my teeth; I took out the rest when they got loose; was suppose to get dentures; never did"     reports that she quit smoking about 6 years ago. Her smoking use included Cigarettes. She has never used smokeless tobacco. She reports that she does not drink alcohol  or use drugs.  Allergies  Allergen Reactions  . Baclofen Other (See Comments)    Causes seizures    Family History  Problem Relation Age of Onset  . Heart attack Father   . Diabetes Mother   . Dementia Mother   . Diabetes Brother   . Heart disease    . Diabetes       Prior to Admission medications   Medication Sig Start Date End Date Taking? Authorizing Provider  ANORO ELLIPTA 62.5-25 MCG/INH AEPB INHALE 1 PUFF INTO THE LUNGS DAILY. 03/22/16  Yes Janith Lima, MD  gabapentin (NEURONTIN) 400 MG capsule Take 400-1,200 mg by mouth 3 (three) times daily. TAKE 1 CAPSULE AT LUNCH, TAKE 2 CAPSULES AT SUPPER AND 3 CAPSULES AT BEDTIME   Yes Historical Provider, MD  HYDROcodone-acetaminophen (NORCO) 10-325 MG tablet Take 1 tablet by mouth every 6 (six) hours as needed. Patient taking differently: Take 1 tablet by mouth every 6 (six) hours as needed for moderate pain.  09/15/15  Yes Janith Lima, MD  tiZANidine (ZANAFLEX) 4 MG tablet 1 TABLET TWICE A DAY AS NEEDED 02/21/16  Yes Historical Provider, MD  furosemide (LASIX) 40 MG tablet Take 1 tablet (40 mg total) by mouth daily. Patient not taking: Reported on 11/30/2016 07/31/16   Janith Lima, MD    Physical Exam: Vitals:   11/30/16 2000 11/30/16 2001 11/30/16 2058 11/30/16  2130  BP: (!) 152/93  (!) 141/93 (!) 176/101  Pulse: 83  77 80  Resp: (!) 21  (!) 22 12  Temp:      TempSrc:      SpO2: 94%  94% 93%  Weight:  (!) 156.5 kg (345 lb)    Height:  5\' 5"  (1.651 m)      Constitutional: NAD, calm, comfortable Eyes: PERRL, lids and conjunctivae normal ENMT: Mucous membranes are moist. Posterior pharynx clear of any exudate or lesions.Normal dentition.  Neck: normal, supple, no masses, no thyromegaly Respiratory: clear to auscultation bilaterally, no wheezing, no crackles. Normal respiratory effort. No accessory muscle use.  Cardiovascular: Regular rate and rhythm, no murmurs / rubs / gallops. No extremity edema. 2+ pedal pulses. No  carotid bruits.  Abdomen: no tenderness, no masses palpated. No hepatosplenomegaly. Bowel sounds positive.  Musculoskeletal: no clubbing / cyanosis. No joint deformity upper and lower extremities. Good ROM, no contractures. Normal muscle tone.  Skin: no rashes, lesions, ulcers. No induration Neurologic: CN 2-12 grossly intact. Sensation intact, DTR normal. Strength 5/5 in all 4.  Psychiatric: Normal judgment and insight. Alert and oriented x 3. Normal mood.    Labs on Admission: I have personally reviewed following labs and imaging studies  CBC:  Recent Labs Lab 11/30/16 2009  WBC 7.0  HGB 14.7  HCT 48.7*  MCV 90.9  PLT 629   Basic Metabolic Panel:  Recent Labs Lab 11/30/16 2009  NA 137  K 4.3  CL 104  CO2 24  GLUCOSE 118*  BUN 11  CREATININE 1.02*  CALCIUM 9.3  MG 2.0   GFR: Estimated Creatinine Clearance: 80.7 mL/min (A) (by C-G formula based on SCr of 1.02 mg/dL (H)). Liver Function Tests: No results for input(s): AST, ALT, ALKPHOS, BILITOT, PROT, ALBUMIN in the last 168 hours. No results for input(s): LIPASE, AMYLASE in the last 168 hours. No results for input(s): AMMONIA in the last 168 hours. Coagulation Profile: No results for input(s): INR, PROTIME in the last 168 hours. Cardiac Enzymes:  Recent Labs Lab 11/30/16 2009  TROPONINI <0.03   BNP (last 3 results)  Recent Labs  03/14/16 1522  PROBNP 35.0   HbA1C: No results for input(s): HGBA1C in the last 72 hours. CBG: No results for input(s): GLUCAP in the last 168 hours. Lipid Profile: No results for input(s): CHOL, HDL, LDLCALC, TRIG, CHOLHDL, LDLDIRECT in the last 72 hours. Thyroid Function Tests: No results for input(s): TSH, T4TOTAL, FREET4, T3FREE, THYROIDAB in the last 72 hours. Anemia Panel: No results for input(s): VITAMINB12, FOLATE, FERRITIN, TIBC, IRON, RETICCTPCT in the last 72 hours. Urine analysis:    Component Value Date/Time   COLORURINE YELLOW 08/24/2015 1016   APPEARANCEUR  CLEAR 08/24/2015 1016   LABSPEC 1.010 08/24/2015 1016   PHURINE 7.0 08/24/2015 1016   GLUCOSEU NEGATIVE 08/24/2015 1016   HGBUR SMALL (A) 08/24/2015 1016   BILIRUBINUR NEGATIVE 08/24/2015 1016   KETONESUR NEGATIVE 08/24/2015 1016   PROTEINUR NEGATIVE 08/24/2015 1016   UROBILINOGEN 1.0 11/20/2014 1316   NITRITE NEGATIVE 08/24/2015 1016   LEUKOCYTESUR SMALL (A) 08/24/2015 1016    Radiological Exams on Admission: Dg Chest 2 View  Result Date: 11/30/2016 CLINICAL DATA:  Chest pain for 2 weeks, LEFT arm pain. History of CHF, COPD. EXAM: CHEST  2 VIEW COMPARISON:  Chest radiograph October 24, 2013 FINDINGS: The cardiac silhouette is moderately enlarged and unchanged. Similar chronic interstitial changes and pulmonary vascular congestion without pleural effusion or focal consolidation. Increased lung volumes  with flattened hemidiaphragms. No pneumothorax. Severe degenerative change of the LEFT shoulder. Large body habitus. IMPRESSION: Stable cardiomegaly, pulmonary vascular congestion and COPD. Electronically Signed   By: Elon Alas M.D.   On: 11/30/2016 20:49    EKG: Independently reviewed.  Assessment/Plan Principal Problem:   Acute on chronic diastolic CHF (congestive heart failure) (HCC) Active Problems:   Severe obesity (BMI >= 40) (HCC)   OSA on CPAP   Essential hypertension    1. Acute on chronic diastolic CHF - 1. Likely due to non adherence to lasix 2. Lasix 40mg  IV daily 3. CHF pathway 4. Will start lisinopril 5mg  daily 2. HTN - continue home meds and start lisinopril 3. OSA - 1. resume CPAP which has been broken for 2 years 2. Suspect that this is likely the bulk of what is causing her hypoxia 3. Worth noting that she was only satting 90% on room air at last PCPs office visit in Dec  DVT prophylaxis: Lovenox Code Status: Full Family Communication: No family in room Disposition Plan: Home after admit Consults called: None Admission status: Place in  [place], [place] [facility] Pager [phone]  If 7AM-7PM, please contact day team taking care of patient [email or web address] Password [email or web address]  11/30/2016, 10:16 PM

## 2016-11-30 NOTE — ED Provider Notes (Signed)
I have personally seen and examined the patient. I have reviewed the documentation on PMH/FH/Soc Hx. I have discussed the plan of care with the resident and patient.  I have reviewed and agree with the resident's documentation. Please see associated encounter note.   EKG Interpretation  Date/Time:  Friday November 30 2016 19:58:50 EDT Ventricular Rate:  85 PR Interval:    QRS Duration: 93 QT Interval:  402 QTC Calculation: 478 R Axis:   -28 Text Interpretation:  Sinus rhythm Atrial premature complexes Borderline left axis deviation Nonspecific T abnrm, anterolateral leads No significant change since last tracing Confirmed by [facility] MD, PEDRO ([id number]) on 11/30/2016 8:11:09 PM         Fatima Blank, MD 11/30/16 2352

## 2016-11-30 NOTE — Care Management (Signed)
ED CM received consult concerning patient's home oxygen concentrator may not be functioning. CM met with patient at bedside she report that she is not on home oxygen but has an old CPAP machine that she uses but needs to have it serviced. Patient present to [facility] ED SOB with O2 sat down in the 80's. Patient ED evaluation still in progress.Updated Dr. Leonette Monarch that patient is not home oxygen.  CM will continue to follow for care transition planning.

## 2016-11-30 NOTE — ED Triage Notes (Signed)
Per EMS, pt from home w c/o chest and mid back pain. Pt reports back pain hurts when coughing. Pt reports a productive cough the past week with yellow sputum. Left sided chest pain 8/10 with sob and vomiting. Pt 89% Room air. 94% on 2L. Pt also reports increased rt leg swelling, painful to touch. Pt reports she hasn't taken her Lasix the past couple of days. Hx of COPD and Afib.

## 2016-11-30 NOTE — ED Notes (Signed)
Patient transported to X-ray 

## 2016-12-01 ENCOUNTER — Observation Stay (HOSPITAL_BASED_OUTPATIENT_CLINIC_OR_DEPARTMENT_OTHER): Payer: Medicare Other

## 2016-12-01 ENCOUNTER — Encounter (HOSPITAL_COMMUNITY): Payer: Self-pay

## 2016-12-01 DIAGNOSIS — R079 Chest pain, unspecified: Secondary | ICD-10-CM | POA: Diagnosis not present

## 2016-12-01 DIAGNOSIS — R0902 Hypoxemia: Secondary | ICD-10-CM | POA: Diagnosis not present

## 2016-12-01 DIAGNOSIS — I130 Hypertensive heart and chronic kidney disease with heart failure and stage 1 through stage 4 chronic kidney disease, or unspecified chronic kidney disease: Secondary | ICD-10-CM | POA: Diagnosis present

## 2016-12-01 DIAGNOSIS — R1084 Generalized abdominal pain: Secondary | ICD-10-CM | POA: Diagnosis not present

## 2016-12-01 DIAGNOSIS — J209 Acute bronchitis, unspecified: Secondary | ICD-10-CM | POA: Diagnosis present

## 2016-12-01 DIAGNOSIS — K5900 Constipation, unspecified: Secondary | ICD-10-CM | POA: Diagnosis present

## 2016-12-01 DIAGNOSIS — Z7951 Long term (current) use of inhaled steroids: Secondary | ICD-10-CM | POA: Diagnosis not present

## 2016-12-01 DIAGNOSIS — G8929 Other chronic pain: Secondary | ICD-10-CM | POA: Diagnosis present

## 2016-12-01 DIAGNOSIS — G629 Polyneuropathy, unspecified: Secondary | ICD-10-CM | POA: Diagnosis present

## 2016-12-01 DIAGNOSIS — I5033 Acute on chronic diastolic (congestive) heart failure: Secondary | ICD-10-CM | POA: Diagnosis not present

## 2016-12-01 DIAGNOSIS — I5030 Unspecified diastolic (congestive) heart failure: Secondary | ICD-10-CM

## 2016-12-01 DIAGNOSIS — R05 Cough: Secondary | ICD-10-CM | POA: Diagnosis not present

## 2016-12-01 DIAGNOSIS — I509 Heart failure, unspecified: Secondary | ICD-10-CM | POA: Diagnosis not present

## 2016-12-01 DIAGNOSIS — N200 Calculus of kidney: Secondary | ICD-10-CM | POA: Diagnosis not present

## 2016-12-01 DIAGNOSIS — Z9989 Dependence on other enabling machines and devices: Secondary | ICD-10-CM | POA: Diagnosis not present

## 2016-12-01 DIAGNOSIS — J9601 Acute respiratory failure with hypoxia: Secondary | ICD-10-CM | POA: Diagnosis present

## 2016-12-01 DIAGNOSIS — Z9119 Patient's noncompliance with other medical treatment and regimen: Secondary | ICD-10-CM | POA: Diagnosis not present

## 2016-12-01 DIAGNOSIS — Z79899 Other long term (current) drug therapy: Secondary | ICD-10-CM | POA: Diagnosis not present

## 2016-12-01 DIAGNOSIS — I10 Essential (primary) hypertension: Secondary | ICD-10-CM | POA: Diagnosis not present

## 2016-12-01 DIAGNOSIS — Z6841 Body Mass Index (BMI) 40.0 and over, adult: Secondary | ICD-10-CM | POA: Diagnosis not present

## 2016-12-01 DIAGNOSIS — R109 Unspecified abdominal pain: Secondary | ICD-10-CM | POA: Diagnosis not present

## 2016-12-01 DIAGNOSIS — R609 Edema, unspecified: Secondary | ICD-10-CM | POA: Diagnosis not present

## 2016-12-01 DIAGNOSIS — Z9109 Other allergy status, other than to drugs and biological substances: Secondary | ICD-10-CM | POA: Diagnosis not present

## 2016-12-01 DIAGNOSIS — J440 Chronic obstructive pulmonary disease with acute lower respiratory infection: Secondary | ICD-10-CM | POA: Diagnosis present

## 2016-12-01 DIAGNOSIS — G4733 Obstructive sleep apnea (adult) (pediatric): Secondary | ICD-10-CM | POA: Diagnosis not present

## 2016-12-01 LAB — ECHOCARDIOGRAM COMPLETE
AO mean calculated velocity dopler: 123 cm/s
AV Area VTI index: 0.95 cm2/m2
AV Area VTI: 2.21 cm2
AV Mean grad: 7 mmHg
AV Peak grad: 12 mmHg
AV area mean vel ind: 0.91 cm2/m2
AV peak Index: 0.92
AV pk vel: 175 cm/s
AV vel: 2.28
AVA: 2.28 cm2
AVAREAMEANV: 2.2 cm2
AVCELMEANRAT: 0.58
Ao pk vel: 0.58 m/s
E decel time: 201 msec
EERAT: 9.59
FS: 23 % — AB (ref 28–44)
HEIGHTINCHES: 65 in
IVS/LV PW RATIO, ED: 0.92
LA ID, A-P, ES: 43 mm
LA diam end sys: 43 mm
LA diam index: 1.78 cm/m2
LA vol A4C: 49.5 ml
LA vol index: 23.3 mL/m2
LA vol: 56.1 mL
LV E/e' medial: 9.59
LV TDI E'LATERAL: 8.04
LV TDI E'MEDIAL: 4.4
LV e' LATERAL: 8.04 cm/s
LVEEAVG: 9.59
LVOT SV: 78 mL
LVOT VTI: 20.5 cm
LVOT area: 3.8 cm2
LVOT diameter: 22 mm
LVOTPV: 102 cm/s
LVOTVTI: 0.6 cm
MV Dec: 201
MVPG: 2 mmHg
MVPKAVEL: 99.5 m/s
MVPKEVEL: 77.1 m/s
PW: 14.4 mm — AB (ref 0.6–1.1)
RV LATERAL S' VELOCITY: 11.8 cm/s
RV TAPSE: 21.4 mm
VTI: 34.1 cm
Valve area index: 0.95
WEIGHTICAEL: 5113.6 [oz_av]

## 2016-12-01 LAB — BASIC METABOLIC PANEL
Anion gap: 12 (ref 5–15)
BUN: 15 mg/dL (ref 6–20)
CALCIUM: 9.3 mg/dL (ref 8.9–10.3)
CHLORIDE: 100 mmol/L — AB (ref 101–111)
CO2: 26 mmol/L (ref 22–32)
CREATININE: 1.09 mg/dL — AB (ref 0.44–1.00)
GFR calc non Af Amer: 51 mL/min — ABNORMAL LOW (ref >60)
GFR, EST AFRICAN AMERICAN: 59 mL/min — AB (ref >60)
Glucose, Bld: 159 mg/dL — ABNORMAL HIGH (ref 65–99)
Potassium: 3.7 mmol/L (ref 3.5–5.1)
SODIUM: 138 mmol/L (ref 135–145)

## 2016-12-01 MED ORDER — AZITHROMYCIN 500 MG PO TABS
500.0000 mg | ORAL_TABLET | Freq: Every day | ORAL | Status: AC
  Administered 2016-12-01 – 2016-12-02 (×2): 500 mg via ORAL
  Filled 2016-12-01 (×2): qty 1

## 2016-12-01 MED ORDER — TRAZODONE HCL 50 MG PO TABS
50.0000 mg | ORAL_TABLET | Freq: Every evening | ORAL | Status: DC | PRN
  Administered 2016-12-01 – 2016-12-02 (×2): 50 mg via ORAL
  Filled 2016-12-01 (×2): qty 1

## 2016-12-01 NOTE — Progress Notes (Signed)
PROGRESS NOTE    Janice Morrow  BHA:[id number] DOB: 07-09-1946 DOA: 11/30/2016 PCP: [name], MD    Brief Narrative: Janice Morrow is a 71 y.o. female with medical history significant of Diastolic CHF, COPD, OSA, HTN.  Patient presents to the ED with 1 week history of cough and chest pain worse with coughing. She was admitted for acute on chronic diastolic congestive heart failure.   Assessment & Plan:   Principal Problem:   Acute on chronic diastolic CHF (congestive heart failure) (HCC) Active Problems:   Severe obesity (BMI >= 40) (HCC)   OSA on CPAP   Essential hypertension   Acute respiratory failure with hypoxia on [place] oxygen probably secondary to acute on chronic diastolic heart failure:  Secondary to non compliant to lasix.  She was started on IV lasix 40 mg daily, strict intake and output, daily weights.  Hasn't diuresed too much.  Echocardiogram ordered showed grade 1 diastolic dysfunction and elevated filling pressures.    Bronchitis:  On Z pack.   COPD:  Stable. No wheezing.    OSA: Pt reports her CPAP is broken.  Will get case manager to see if she can get new CPAP machine.    Chronic back pain and neuropathy: Resume gabapentin.    Hypertension: well controlled.  Resume home meds.      DVT prophylaxis: (Lovenox/) Code Status: (full code.  Family Communication: none at bedside.  Disposition Plan: pending PT eval.    Consultants:   None.    Procedures: echocardiogram.    Antimicrobials: zithromax.    Subjective: Breathing better. She reports some numbness of the hands.   Objective: Vitals:   11/30/16 2245 12/01/16 0025 12/01/16 0650 12/01/16 1200  BP: (!) 164/91 (!) 145/95 131/79 129/84  Pulse: 81 90 79 88  Resp: (!) 23 18 18 18   Temp:  98.8 F (37.1 C) 98.4 F (36.9 C) 98.7 F (37.1 C)  TempSrc:  Oral Oral Oral  SpO2: 91% 94% 94% 91%  Weight:  (!) 145 kg (319 lb 9.6 oz)    Height:  5\' 5"  (1.651 m)      Intake/Output  Summary (Last 24 hours) at 12/01/16 1349 Last data filed at 12/01/16 1025  Gross per 24 hour  Intake              493 ml  Output              100 ml  Net              393 ml   Filed Weights   11/30/16 2001 12/01/16 0025  Weight: (!) 156.5 kg (345 lb) (!) 145 kg (319 lb 9.6 oz)    Examination:  General exam: Appears calm and comfortable on 2l it of [place] oxygen.  Respiratory system: scattered rales at bases.  Cardiovascular system: S1 & S2 heard, RRR.   No pedal edema. Gastrointestinal system: Abdomen is nondistended, soft and nontender. No organomegaly or masses felt. Normal bowel sounds heard. Central nervous system: Alert and oriented. No focal neurological deficits. Extremities: Symmetric 5 x 5 power. Skin: No rashes, lesions or ulcers Psychiatry: Judgement and insight appear normal. Mood & affect appropriate.     Data Reviewed: I have personally reviewed following labs and imaging studies  CBC:  Recent Labs Lab 11/30/16 2009  WBC 7.0  HGB 14.7  HCT 48.7*  MCV 90.9  PLT 973   Basic Metabolic Panel:  Recent Labs Lab 11/30/16 2009 12/01/16 0301  NA 137 138  K 4.3 3.7  CL 104 100*  CO2 24 26  GLUCOSE 118* 159*  BUN 11 15  CREATININE 1.02* 1.09*  CALCIUM 9.3 9.3  MG 2.0  --    GFR: Estimated Creatinine Clearance: 71.9 mL/min (A) (by C-G formula based on SCr of 1.09 mg/dL (H)). Liver Function Tests: No results for input(s): AST, ALT, ALKPHOS, BILITOT, PROT, ALBUMIN in the last 168 hours. No results for input(s): LIPASE, AMYLASE in the last 168 hours. No results for input(s): AMMONIA in the last 168 hours. Coagulation Profile: No results for input(s): INR, PROTIME in the last 168 hours. Cardiac Enzymes:  Recent Labs Lab 11/30/16 2009  TROPONINI <0.03   BNP (last 3 results)  Recent Labs  03/14/16 1522  PROBNP 35.0   HbA1C: No results for input(s): HGBA1C in the last 72 hours. CBG: No results for input(s): GLUCAP in the last 168 hours. Lipid  Profile: No results for input(s): CHOL, HDL, LDLCALC, TRIG, CHOLHDL, LDLDIRECT in the last 72 hours. Thyroid Function Tests: No results for input(s): TSH, T4TOTAL, FREET4, T3FREE, THYROIDAB in the last 72 hours. Anemia Panel: No results for input(s): VITAMINB12, FOLATE, FERRITIN, TIBC, IRON, RETICCTPCT in the last 72 hours. Sepsis Labs: No results for input(s): PROCALCITON, LATICACIDVEN in the last 168 hours.  No results found for this or any previous visit (from the past 240 hour(s)).       Radiology Studies: Dg Chest 2 View  Result Date: 11/30/2016 CLINICAL DATA:  Chest pain for 2 weeks, LEFT arm pain. History of CHF, COPD. EXAM: CHEST  2 VIEW COMPARISON:  Chest radiograph October 24, 2013 FINDINGS: The cardiac silhouette is moderately enlarged and unchanged. Similar chronic interstitial changes and pulmonary vascular congestion without pleural effusion or focal consolidation. Increased lung volumes with flattened hemidiaphragms. No pneumothorax. Severe degenerative change of the LEFT shoulder. Large body habitus. IMPRESSION: Stable cardiomegaly, pulmonary vascular congestion and COPD. Electronically Signed   By: Elon Alas M.D.   On: 11/30/2016 20:49        Scheduled Meds: . enoxaparin (LOVENOX) injection  40 mg Subcutaneous Q24H  . furosemide  40 mg Intravenous Daily  . gabapentin  1,200 mg Oral QHS  . gabapentin  400 mg Oral Q lunch  . gabapentin  800 mg Oral Q supper  . lisinopril  5 mg Oral Daily  . sodium chloride flush  3 mL Intravenous Q12H  . umeclidinium-vilanterol  1 puff Inhalation Daily   Continuous Infusions:   LOS: 0 days    Time spent: 35 minutes.     Hosie Poisson, MD [facility] Pager [phone]  If 7PM-7AM, please contact night-coverage [email or web address] Password [email or web address] 12/01/2016, 1:49 PM

## 2016-12-01 NOTE — Progress Notes (Signed)
Patient complaining of numbness and stiffness to bilateral hand and fingers. Patient states that it's a new onset.  MD notified.

## 2016-12-02 ENCOUNTER — Inpatient Hospital Stay (HOSPITAL_COMMUNITY): Payer: Medicare Other

## 2016-12-02 LAB — BASIC METABOLIC PANEL
ANION GAP: 6 (ref 5–15)
BUN: 29 mg/dL — ABNORMAL HIGH (ref 6–20)
CALCIUM: 9.4 mg/dL (ref 8.9–10.3)
CO2: 28 mmol/L (ref 22–32)
CREATININE: 1.33 mg/dL — AB (ref 0.44–1.00)
Chloride: 103 mmol/L (ref 101–111)
GFR calc non Af Amer: 40 mL/min — ABNORMAL LOW (ref >60)
GFR, EST AFRICAN AMERICAN: 46 mL/min — AB (ref >60)
GLUCOSE: 133 mg/dL — AB (ref 65–99)
Potassium: 4.2 mmol/L (ref 3.5–5.1)
Sodium: 137 mmol/L (ref 135–145)

## 2016-12-02 LAB — SEDIMENTATION RATE: Sed Rate: 15 mm/hr (ref 0–22)

## 2016-12-02 LAB — C-REACTIVE PROTEIN: CRP: <0.8 mg/dL (ref <1.0)

## 2016-12-02 LAB — HEPATIC FUNCTION PANEL
ALT: 14 U/L (ref 14–54)
AST: 15 U/L (ref 15–41)
Albumin: 2.8 g/dL — ABNORMAL LOW (ref 3.5–5.0)
Alkaline Phosphatase: 54 U/L (ref 38–126)
Bilirubin, Direct: 0.3 mg/dL (ref 0.1–0.5)
Indirect Bilirubin: 0.4 mg/dL (ref 0.3–0.9)
TOTAL PROTEIN: 6.8 g/dL (ref 6.5–8.1)
Total Bilirubin: 0.7 mg/dL (ref 0.3–1.2)

## 2016-12-02 LAB — LIPASE, BLOOD: LIPASE: 17 U/L (ref 11–51)

## 2016-12-02 LAB — D-DIMER, QUANTITATIVE: D-Dimer, Quant: 0.72 ug/mL-FEU — ABNORMAL HIGH (ref 0.00–0.50)

## 2016-12-02 MED ORDER — ENOXAPARIN SODIUM 80 MG/0.8ML ~~LOC~~ SOLN
0.5000 mg/kg | SUBCUTANEOUS | Status: DC
  Administered 2016-12-02 – 2016-12-03 (×2): 75 mg via SUBCUTANEOUS
  Filled 2016-12-02 (×2): qty 0.8

## 2016-12-02 MED ORDER — SENNOSIDES-DOCUSATE SODIUM 8.6-50 MG PO TABS
1.0000 | ORAL_TABLET | Freq: Two times a day (BID) | ORAL | Status: DC
  Administered 2016-12-02 – 2016-12-04 (×5): 1 via ORAL
  Filled 2016-12-02 (×5): qty 1

## 2016-12-02 MED ORDER — POLYETHYLENE GLYCOL 3350 17 G PO PACK
17.0000 g | PACK | Freq: Every day | ORAL | Status: DC
  Administered 2016-12-02 – 2016-12-03 (×2): 17 g via ORAL
  Filled 2016-12-02 (×3): qty 1

## 2016-12-02 MED ORDER — FUROSEMIDE 40 MG PO TABS
40.0000 mg | ORAL_TABLET | Freq: Every day | ORAL | Status: DC
  Administered 2016-12-02 – 2016-12-04 (×3): 40 mg via ORAL
  Filled 2016-12-02 (×3): qty 1

## 2016-12-02 NOTE — Progress Notes (Signed)
Consulted for Lovenox dosing for VTE prophylaxis.  Patient's BMI >30 which qualifies her for obesity dosing of 0.5mg /kg q24h.  She was on Lovenox 40mg  subQ q24h with last dose given 4/7 at ~2230.  Plan: Change Lovenox to 75mg  (0.5mg /kg) subQ q24h starting tonight at 2200 Pharmacy to sign off as no further doses required

## 2016-12-02 NOTE — Progress Notes (Signed)
Placed pt on cpap at this time per pt request. Pt able to place self on and off if needed.

## 2016-12-02 NOTE — Progress Notes (Signed)
[facility] Progress Note  Patient: Janice Morrow MVE:[id number]   PCP: [name], MD DOB: [birth year]   DOA: 11/30/2016   DOS: 12/02/2016   Date of Service: the patient was seen and examined on 12/02/2016  Subjective: Complains of mild diffuse abdominal pain, also has chest pain and shortness of breath. Questions regarding muscle spasm.  Brief hospital course: Pt. with PMH of CHF, COPD, OSA, HTN; admitted on 11/30/2016, with complaint of chest pain and shortness of breath and cough, was found to have acute on chronic diastolic CHF. Also has a staghorn renal stone with abdominal pain. Currently further plan is continue close monitoring.  Assessment and Plan: 1. Acute on chronic diastolic CHF (congestive heart failure) (HCC) Acute hypoxic respiratory failure. Requiring 2 L of oxygen at present to maintain adequate saturation. Has OSA and is compliant with Cipro in the hospital. Mild increase in the renal function from her baseline, I would change Lasix to by mouth. Continue daily ins and outs and daily weight. Echocardiogram shows grade 1 diastolic dysfunction.  2. Chest pain. Noncardiac. Serial troponins are negative. Echocardiogram does not show any wall motion abnormality is, EKG unremarkable. D-dimer mildly elevated but possibility of PE is less likely although I would get lower extremity Doppler. Add incentive spirometry for hypoxia. Normal ESR and CRP not suggestive of any pericarditis.  3. Renal stone. Abdominal pain. Patient complains about abdominal pain, will get CT renal stone study. X-ray abdomen shows significant amount of constipation as well as a large staghorn on the left kidney. Monitor at present. Stool softeners ordered. Lipase normal, I repeated function panel normal.  4. Acute bronchitis. COPD. Started on Z-Pak this admission. No evidence of wheezing on examination, appears stable. Monitor. Continue home inhalers.  5. OSA. Noncompliant with Cipro At  home. Compliant here in the hospital, his brother consulted.  6. Chronic back pain. Neuropathy. Next on patient is on heavy dose of gabapentin. Monitor for side effects in the hospital.  Bowel regimen: last BM 12/02/2016 Diet: cardiac diet DVT Prophylaxis: subcutaneous Heparin  Advance goals of care discussion: full code  Family Communication: no family was present at bedside, at the time of interview.  Disposition:  Discharge to home. Expected discharge date: 04/09-05/2017,   Consultants: none Procedures: Echocardiogram   Antibiotics: Anti-infectives    Start     Dose/Rate Route Frequency Ordered Stop   12/01/16 1400  azithromycin (ZITHROMAX) tablet 500 mg     500 mg Oral Daily 12/01/16 1355 12/02/16 1002   11/30/16 2200  azithromycin (ZITHROMAX) 500 mg in dextrose 5 % 250 mL IVPB     500 mg 250 mL/hr over 60 Minutes Intravenous  Once 11/30/16 2146 11/30/16 2328       Objective: Physical Exam: Vitals:   12/01/16 2033 12/02/16 0639 12/02/16 0713 12/02/16 1200  BP: 136/65 120/65  125/87  Pulse: 81 97  79  Resp: 18   18  Temp: 98.5 F (36.9 C) 97.5 F (36.4 C)  97.8 F (36.6 C)  TempSrc: Oral Axillary  Oral  SpO2: 95% 92% 91% 93%  Weight:  (!) 146.6 kg (323 lb 4.8 oz)    Height:        Intake/Output Summary (Last 24 hours) at 12/02/16 1651 Last data filed at 12/02/16 1300  Gross per 24 hour  Intake              843 ml  Output             1350 ml  Net             -507 ml   Filed Weights   11/30/16 2001 12/01/16 0025 12/02/16 0639  Weight: (!) 156.5 kg (345 lb) (!) 145 kg (319 lb 9.6 oz) (!) 146.6 kg (323 lb 4.8 oz)   General: Alert, Awake and Oriented to Time, Place and Person. Appear in moderate distress, affect appropriate Eyes: PERRL, Conjunctiva normal ENT: Oral Mucosa clear moist. Neck: difficult to assess JVD, no Abnormal Mass Or lumps Cardiovascular: S1 and S2 Present, no Murmur, Respiratory: Bilateral Air entry equal and Decreased, no use of  accessory muscle, basal Crackles, no wheezes Abdomen: Bowel Sound present, Soft and no tenderness Skin: no redness, no Rash, no induration Extremities: bilateral Pedal edema, no calf tenderness Neurologic: Grossly no focal neuro deficit. Bilaterally Equal motor strength  Data Reviewed: CBC:  Recent Labs Lab 11/30/16 2009  WBC 7.0  HGB 14.7  HCT 48.7*  MCV 90.9  PLT 211   Basic Metabolic Panel:  Recent Labs Lab 11/30/16 2009 12/01/16 0301 12/02/16 0146  NA 137 138 137  K 4.3 3.7 4.2  CL 104 100* 103  CO2 '24 26 28  ' GLUCOSE 118* 159* 133*  BUN 11 15 29*  CREATININE 1.02* 1.09* 1.33*  CALCIUM 9.3 9.3 9.4  MG 2.0  --   --     Liver Function Tests:  Recent Labs Lab 12/02/16 1053  AST 15  ALT 14  ALKPHOS 54  BILITOT 0.7  PROT 6.8  ALBUMIN 2.8*    Recent Labs Lab 12/02/16 1053  LIPASE 17   No results for input(s): AMMONIA in the last 168 hours. Coagulation Profile: No results for input(s): INR, PROTIME in the last 168 hours. Cardiac Enzymes:  Recent Labs Lab 11/30/16 2009  TROPONINI <0.03   BNP (last 3 results)  Recent Labs  03/14/16 1522  PROBNP 35.0   CBG: No results for input(s): GLUCAP in the last 168 hours. Studies: Dg Abd 2 Views  Result Date: 12/02/2016 CLINICAL DATA:  Generalized abdominal pain for 2 weeks. EXAM: ABDOMEN - 2 VIEW COMPARISON:  None. FINDINGS: No free air, portal venous gas, or pneumatosis. An irregular shaped 3 cm calcification in the left side of the abdomen could be within the left kidney or left renal pelvis. This could represent a staghorn calculus. Fecal loading seen in the colon. No bowel obstruction. Surgical changes seen in the right lower abdomen. IMPRESSION: 1. 3 cm calcification in the left side of the abdomen could represent a staghorn renal calculus. 2. Fecal loading in the colon.  No bowel obstruction. Electronically Signed   By: Dorise Bullion III M.D   On: 12/02/2016 11:56    Scheduled Meds: . enoxaparin  (LOVENOX) injection  40 mg Subcutaneous Q24H  . furosemide  40 mg Oral Daily  . gabapentin  1,200 mg Oral QHS  . gabapentin  400 mg Oral Q lunch  . gabapentin  800 mg Oral Q supper  . polyethylene glycol  17 g Oral Daily  . senna-docusate  1 tablet Oral BID  . sodium chloride flush  3 mL Intravenous Q12H  . umeclidinium-vilanterol  1 puff Inhalation Daily   Continuous Infusions: PRN Meds: sodium chloride, acetaminophen, HYDROcodone-acetaminophen, ondansetron (ZOFRAN) IV, sodium chloride flush, tiZANidine, traZODone  Time spent: 30 minutes  Author: Berle Mull, MD [facility] Pager: [phone] 12/02/2016 4:51 PM  If 7PM-7AM, please contact night-coverage at [email or web address], password [facility]

## 2016-12-03 ENCOUNTER — Inpatient Hospital Stay (HOSPITAL_COMMUNITY): Payer: Medicare Other

## 2016-12-03 DIAGNOSIS — I5033 Acute on chronic diastolic (congestive) heart failure: Principal | ICD-10-CM

## 2016-12-03 DIAGNOSIS — Z9989 Dependence on other enabling machines and devices: Secondary | ICD-10-CM

## 2016-12-03 DIAGNOSIS — G4733 Obstructive sleep apnea (adult) (pediatric): Secondary | ICD-10-CM

## 2016-12-03 DIAGNOSIS — N200 Calculus of kidney: Secondary | ICD-10-CM

## 2016-12-03 DIAGNOSIS — I10 Essential (primary) hypertension: Secondary | ICD-10-CM

## 2016-12-03 DIAGNOSIS — R109 Unspecified abdominal pain: Secondary | ICD-10-CM

## 2016-12-03 DIAGNOSIS — I509 Heart failure, unspecified: Secondary | ICD-10-CM

## 2016-12-03 DIAGNOSIS — R609 Edema, unspecified: Secondary | ICD-10-CM

## 2016-12-03 LAB — BASIC METABOLIC PANEL
Anion gap: 11 (ref 5–15)
BUN: 34 mg/dL — AB (ref 6–20)
CALCIUM: 9.7 mg/dL (ref 8.9–10.3)
CO2: 27 mmol/L (ref 22–32)
CREATININE: 1.24 mg/dL — AB (ref 0.44–1.00)
Chloride: 99 mmol/L — ABNORMAL LOW (ref 101–111)
GFR, EST AFRICAN AMERICAN: 51 mL/min — AB (ref >60)
GFR, EST NON AFRICAN AMERICAN: 44 mL/min — AB (ref >60)
GLUCOSE: 129 mg/dL — AB (ref 65–99)
Potassium: 4.5 mmol/L (ref 3.5–5.1)
Sodium: 137 mmol/L (ref 135–145)

## 2016-12-03 LAB — CBC
HCT: 46.8 % — ABNORMAL HIGH (ref 36.0–46.0)
Hemoglobin: 13.7 g/dL (ref 12.0–15.0)
MCH: 27.1 pg (ref 26.0–34.0)
MCHC: 29.3 g/dL — ABNORMAL LOW (ref 30.0–36.0)
MCV: 92.5 fL (ref 78.0–100.0)
PLATELETS: 201 10*3/uL (ref 150–400)
RBC: 5.06 MIL/uL (ref 3.87–5.11)
RDW: 15.8 % — AB (ref 11.5–15.5)
WBC: 6.8 10*3/uL (ref 4.0–10.5)

## 2016-12-03 LAB — LACTIC ACID, PLASMA: LACTIC ACID, VENOUS: 1.7 mmol/L (ref 0.5–1.9)

## 2016-12-03 LAB — MAGNESIUM: MAGNESIUM: 2.3 mg/dL (ref 1.7–2.4)

## 2016-12-03 NOTE — Evaluation (Signed)
Physical Therapy Evaluation/ Discharge (per pt request) Patient Details Name: Janice Morrow MRN: [id number] DOB: 1946-06-20 Today's Date: 12/03/2016   History of Present Illness  71 yo admitted with CHF, SOB, abdominal pain with renal stone. PMHx: obesity, chronic back and knee pain, CHF, HTN, OSA, COPD  Clinical Impression  Pt states she and brother live together in a motel time rooming house where he smokes cigars throughout the day. Pt with O2 sats 90% on RA, 86% on RA with gait. Pt is a high fall risk with improper use and height of current cane, reaching out for environmental support and sedentary lifestyle due to difficulty with mobility and Right knee pain. Pt educated for balance, gait, strength, and functional deficits with recommendation for HHPT and RW use as well as potential need for home O2. Pt declined all stating she will not use a RW, she will not have an O2 tank in the house because brother smokes and she will not ask him to stop, she will not make changes to her current mobility or function. Pt acknowledges education, declines further needs and is functioning at her baseline. Will sign off per pt request despite needs and deficits.     Follow Up Recommendations Home health PT (pt declines)    Equipment Recommendations  Rolling walker with 5" wheels (pt declines)    Recommendations for Other Services       Precautions / Restrictions Precautions Precautions: Fall      Mobility  Bed Mobility Overal bed mobility: Needs Assistance Bed Mobility: Supine to Sit;Sit to Supine     Supine to sit: Supervision;HOB elevated Sit to supine: Supervision;HOB elevated   General bed mobility comments: increased time, struggling to get legs back onto bed, momentum to rise  Transfers Overall transfer level: Modified independent               General transfer comment: increased time and difficulty with momentum to rise  Ambulation/Gait Ambulation/Gait assistance: Min  guard Ambulation Distance (Feet): 20 Feet Assistive device: Straight cane Gait Pattern/deviations: Shuffle;Trunk flexed;Wide base of support   Gait velocity interpretation: Below normal speed for age/gender General Gait Details: pt with unsteady gait maintaining very flexed trunk leaning on cane with cane grossly 6 inches too tall (pt refused to adjust cane or attempt RW), reaching out for environmental support and limited by fatigue. Despite education for fall risk, unsteady gait and proper cane use and position as well as benefit of RW pt acknowledged education and refused to implement any change  Stairs            Wheelchair Mobility    Modified Rankin (Stroke Patients Only)       Balance Overall balance assessment: Needs assistance   Sitting balance-Leahy Scale: Good       Standing balance-Leahy Scale: Poor                               Pertinent Vitals/Pain Pain Assessment: 0-10 Pain Score: 4  Pain Location: right knee Pain Descriptors / Indicators: Aching;Sore Pain Intervention(s): Limited activity within patient's tolerance;Repositioned    Home Living Family/patient expects to be discharged to:: Private residence Living Arrangements: Other relatives Available Help at Discharge: Family;Available 24 hours/day Type of Home: Apartment Home Access: Level entry     Home Layout: One level Home Equipment: Cane - single point      Prior Function Level of Independence: Independent with assistive device(s)  Comments: pt reports she gets down into tub, doesn't really leave her boarding room at all, brother does the grocery shopping and smokes cigars in the room     Hand Dominance        Extremity/Trunk Assessment   Upper Extremity Assessment Upper Extremity Assessment: Generalized weakness    Lower Extremity Assessment Lower Extremity Assessment: Generalized weakness    Cervical / Trunk Assessment Cervical / Trunk Assessment:  Kyphotic  Communication   Communication: No difficulties  Cognition Arousal/Alertness: Awake/alert Behavior During Therapy: WFL for tasks assessed/performed Overall Cognitive Status: Impaired/Different from baseline Area of Impairment: Safety/judgement                         Safety/Judgement: Decreased awareness of safety;Decreased awareness of deficits     General Comments: Pt educated for gait and safety deficits as well as need for O2 but pt denies them all. Stating "I won't use a walker til I have to", " I won't use an oxygen tank because I'm not going to tell my brother to quit smoking"      General Comments      Exercises     Assessment/Plan    PT Assessment Patent does not need any further PT services (per pt request to D/C as she is not interested in change or correction to habits at this time. Despite education for role and benefit)  PT Problem List         PT Treatment Interventions      PT Goals (Current goals can be found in the Care Plan section)  Acute Rehab PT Goals PT Goal Formulation: All assessment and education complete, DC therapy    Frequency     Barriers to discharge        Co-evaluation               End of Session Equipment Utilized During Treatment: Gait belt Activity Tolerance: Patient tolerated treatment well Patient left: in bed;with call bell/phone within reach Nurse Communication: Mobility status PT Visit Diagnosis: Unsteadiness on feet (R26.81);Muscle weakness (generalized) (M62.81)    Time: 7416-3845 PT Time Calculation (min) (ACUTE ONLY): 23 min   Charges:   PT Evaluation $PT Eval Moderate Complexity: 1 Procedure     PT G Codes:        Elwyn Reach, PT [phone]   Baker 12/03/2016, 10:09 AM

## 2016-12-03 NOTE — Progress Notes (Signed)
PROGRESS NOTE    Janice Morrow  NTZ:[id number] DOB: Jan 26, 1946 DOA: 11/30/2016 PCP: [name], MD   Chief Complaint  Patient presents with  . Chest Pain  . Back Pain    Brief Narrative:  HPI on 11/30/2016 by Dr. Jennette Kettle Janice Morrow is a 71 y.o. female with medical history significant of Diastolic CHF, COPD, OSA, HTN.  Patient presents to the ED with 1 week history of cough and chest pain worse with coughing.  Cough is productive of sputum.  Nothing makes better or worse.  Cough is persistent.  No fevers, chills, N/V/D.  Of note patient took herself off of her lasix 2 weeks ago. Assessment & Plan   Acute on chronic diastolic congestive heart failure with Acute hypoxic respiratory failure -O2 sats down to 86%, continue nasal canula to maintain sat>90% -History of OSA, compliant with CPAP  -Echcoardiogram: EF 67-59%, grade 1 diastolic dysfunction -Continue to monitor intake/output, daily weights -Urine output 1400cc over past 24 hours -continue incentive spirometry  Chest pain -Noncardiac, troponins cycled and unremarkable -Echocardiogram as above, EKG unremarkable. -D-dimer mildly elevated but possibility of PE is less likely. LE doppler pending -Normal ESR and CRP not suggestive of any pericarditis.  Renal stone/Abdominal pain/Constipation -Patient complains about abdominal pain -CT renal study: Staghorn calculus of the left kidney measuring 2.8 x 1.9 x 1.2cm.  -X-ray abdomen shows significant amount of constipation as well as a large staghorn on the left kidney. -Constipation resolving, having BM -Discussed with Dr. Alinda Money, urology, calculus will need outpatient follow up  Acute bronchitis/COPD -Continue azithromycin, currently no wheezing. -Continue Anoroa  OSA -Noncompliant with CPAP at home.  Chronic back pain -has neuropathy and on gabapentin  Chronic kidney disease, stage III -Creatinine mildly increased, but GFR at baseline -Continue to monitor  BMP  DVT Prophylaxis  Lovenox  Code Status: Full  Family Communication: None at bedside  Disposition Plan: Admitted. Pending improvement in symptoms.   Consultants Dr. Alinda Money, Urology, via phone  Procedures  None  Antibiotics   Anti-infectives    Start     Dose/Rate Route Frequency Ordered Stop   12/01/16 1400  azithromycin (ZITHROMAX) tablet 500 mg     500 mg Oral Daily 12/01/16 1355 12/02/16 1002   11/30/16 2200  azithromycin (ZITHROMAX) 500 mg in dextrose 5 % 250 mL IVPB     500 mg 250 mL/hr over 60 Minutes Intravenous  Once 11/30/16 2146 11/30/16 2328      Subjective:   Janice Morrow seen and examined today.  Was able to have bowel movement, abdominal pain improving. Feels breathing is improving, but not back at baseline. Denies chest pain, nausea, vomiting.   Objective:   Vitals:   12/03/16 0728 12/03/16 0949 12/03/16 1003 12/03/16 1146  BP:   98/62 111/78  Pulse:  93 90 78  Resp:   16 16  Temp:    97.2 F (36.2 C)  TempSrc:    Oral  SpO2: (!) 88% (!) 86% 97% 96%  Weight:      Height:        Intake/Output Summary (Last 24 hours) at 12/03/16 1237 Last data filed at 12/03/16 1147  Gross per 24 hour  Intake              960 ml  Output             1820 ml  Net             -860 ml  Filed Weights   12/01/16 0025 12/02/16 0639 12/03/16 0519  Weight: (!) 145 kg (319 lb 9.6 oz) (!) 146.6 kg (323 lb 4.8 oz) (!) 147.8 kg (325 lb 14.4 oz)    Exam  General: Well developed, well nourished, NAD, appears stated age  71: NCAT, mucous membranes moist.   Cardiovascular: S1 S2 auscultated, RRR  Respiratory: Diminshed but clear  Abdomen: Soft, nontender, nondistended, + bowel sounds  Extremities: warm dry without cyanosis clubbing. Trace LE edema  Neuro: AAOx3, nonfocal  Skin: Without rashes exudates or nodules  Psych: Normal affect and demeanor    Data Reviewed: I have personally reviewed following labs and imaging studies  CBC:  Recent Labs Lab  11/30/16 2009 12/03/16 0453  WBC 7.0 6.8  HGB 14.7 13.7  HCT 48.7* 46.8*  MCV 90.9 92.5  PLT 181 211   Basic Metabolic Panel:  Recent Labs Lab 11/30/16 2009 12/01/16 0301 12/02/16 0146 12/03/16 0453  NA 137 138 137 137  K 4.3 3.7 4.2 4.5  CL 104 100* 103 99*  CO2 _0 GLUCOSE 118* 159* 133* 129*  BUN 11 15 29* 34*  CREATININE 1.02* 1.09* 1.33* 1.24*  CALCIUM 9.3 9.3 9.4 9.7  MG 2.0  --   --  2.3   GFR: Estimated Creatinine Clearance: 64 mL/min (A) (by C-G formula based on SCr of 1.24 mg/dL (H)). Liver Function Tests:  Recent Labs Lab 12/02/16 1053  AST 15  ALT 14  ALKPHOS 54  BILITOT 0.7  PROT 6.8  ALBUMIN 2.8*    Recent Labs Lab 12/02/16 1053  LIPASE 17   No results for input(s): AMMONIA in the last 168 hours. Coagulation Profile: No results for input(s): INR, PROTIME in the last 168 hours. Cardiac Enzymes:  Recent Labs Lab 11/30/16 2009  TROPONINI <0.03   BNP (last 3 results)  Recent Labs  03/14/16 1522  PROBNP 35.0   HbA1C: No results for input(s): HGBA1C in the last 72 hours. CBG: No results for input(s): GLUCAP in the last 168 hours. Lipid Profile: No results for input(s): CHOL, HDL, LDLCALC, TRIG, CHOLHDL, LDLDIRECT in the last 72 hours. Thyroid Function Tests: No results for input(s): TSH, T4TOTAL, FREET4, T3FREE, THYROIDAB in the last 72 hours. Anemia Panel: No results for input(s): VITAMINB12, FOLATE, FERRITIN, TIBC, IRON, RETICCTPCT in the last 72 hours. Urine analysis:    Component Value Date/Time   COLORURINE YELLOW 08/24/2015 1016   APPEARANCEUR CLEAR 08/24/2015 1016   LABSPEC 1.010 08/24/2015 1016   PHURINE 7.0 08/24/2015 1016   GLUCOSEU NEGATIVE 08/24/2015 1016   HGBUR SMALL (A) 08/24/2015 1016   BILIRUBINUR NEGATIVE 08/24/2015 1016   KETONESUR NEGATIVE 08/24/2015 1016   PROTEINUR NEGATIVE 08/24/2015 1016   UROBILINOGEN 1.0 11/20/2014 1316   NITRITE NEGATIVE 08/24/2015 1016   LEUKOCYTESUR SMALL (A)  08/24/2015 1016   Sepsis Labs: _1 (procalcitonin:4,lacticidven:4)  )No results found for this or any previous visit (from the past 240 hour(s)).    Radiology Studies: Dg Abd 2 Views  Result Date: 12/02/2016 CLINICAL DATA:  Generalized abdominal pain for 2 weeks. EXAM: ABDOMEN - 2 VIEW COMPARISON:  None. FINDINGS: No free air, portal venous gas, or pneumatosis. An irregular shaped 3 cm calcification in the left side of the abdomen could be within the left kidney or left renal pelvis. This could represent a staghorn calculus. Fecal loading seen in the colon. No bowel obstruction. Surgical changes seen in the right lower abdomen. IMPRESSION: 1. 3 cm calcification in the left side  of the abdomen could represent a staghorn renal calculus. 2. Fecal loading in the colon.  No bowel obstruction. Electronically Signed   By: Dorise Bullion III M.D   On: 12/02/2016 11:56   Ct Renal Stone Study  Result Date: 12/02/2016 CLINICAL DATA:  Evaluation for left renal stone. Cough and phlegm with chest pain. EXAM: CT ABDOMEN AND PELVIS WITHOUT CONTRAST TECHNIQUE: Multidetector CT imaging of the abdomen and pelvis was performed following the standard protocol without IV contrast. COMPARISON:  None. FINDINGS: Lower chest: Borderline cardiomegaly. Minimal patchy atelectasis and/or infiltrate posteriorly in the right lower lobe. Subsegmental atelectasis in the lingula. Hepatobiliary: No focal liver abnormality is seen. No gallstones, gallbladder wall thickening, or biliary dilatation. Pancreas: Unremarkable. No pancreatic ductal dilatation or surrounding inflammatory changes. Spleen: Normal in size without focal abnormality. Adrenals/Urinary Tract: 2.8 x 1.9 x 1.2 cm staghorn calculus in left renal pelvis with smaller interpolar and left upper pole calculi noted. 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 The right kidney is demonstrates an interpolar 1.8 cm cyst laterally. No hydroureter. Urinary bladder is contracted. Stomach/Bowel: Contracted stomach. Normal small bowel rotation without inflammation or obstruction. Moderate colonic stool burden throughout large bowel consistent with constipation. Normal appendix. Vascular/Lymphatic: Aortic atherosclerosis. No enlarged abdominal or pelvic lymph nodes. Reproductive: Uterus and bilateral adnexa are unremarkable. Other: Small ventral mesh repair of umbilical hernia. Musculoskeletal: Degenerative disc disease L5-S1. No acute nor suspicious osseous lesions. IMPRESSION: 1. Staghorn calculus of the left kidney measuring 2.8 x 1.9 x 1.2 cm. 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 Smaller nonobstructing renal calculi are seen in the interpolar and upper pole of the left kidney. 2. There is atelectasis posteriorly in the right lower lobe. Superimposed pneumonia is not entirely excluded. 3. Moderate colonic stool burden consistent with constipation. 4. Aortic atherosclerosis. 5. Degenerative disc disease L5-S1. Electronically Signed   By: Ashley Royalty M.D.   On: 12/02/2016 20:46     Scheduled Meds: . enoxaparin (LOVENOX) injection  0.5 mg/kg Subcutaneous Q24H  . furosemide  40 mg Oral Daily  . gabapentin  1,200 mg Oral QHS  . gabapentin  400 mg Oral Q lunch  . gabapentin  800 mg Oral Q supper  . polyethylene glycol  17 g Oral Daily  . senna-docusate  1 tablet Oral BID  . sodium chloride flush  3 mL Intravenous Q12H  . umeclidinium-vilanterol  1 puff Inhalation Daily   Continuous Infusions:   LOS: 2 days   Time Spent in minutes   30 minutes  Arnette Driggs D.O. on 12/03/2016 at 12:37 PM  Between 7am to 7pm - Pager - [phone]  After 7pm go to  [email or web address] - password [email or web address]  And look for the night coverage person covering for me after hours  [facility] Office  [phone]

## 2016-12-03 NOTE — Progress Notes (Signed)
*  PRELIMINARY RESULTS* Vascular Ultrasound Bilateral lower extremity venous duplex has been completed.  Preliminary findings: No evidence of deep vein thrombosis in the visualized veins of the lower extremities.  Technically limited exam due to patient body habitus and limitations with penetration.  Negative for baker's cysts bilaterally.   Everrett Coombe 12/03/2016, 2:54 PM

## 2016-12-03 NOTE — Progress Notes (Signed)
TCT Janice Morrow with [facility] concerning home oxygen and CPAP that needs repairing; Janice Morrow stated that he would look into it; B McLaughlin RN,MHA,BSN [phone]

## 2016-12-04 DIAGNOSIS — R05 Cough: Secondary | ICD-10-CM

## 2016-12-04 DIAGNOSIS — R0902 Hypoxemia: Secondary | ICD-10-CM

## 2016-12-04 DIAGNOSIS — R079 Chest pain, unspecified: Secondary | ICD-10-CM

## 2016-12-04 LAB — CBC
HCT: 44.9 % (ref 36.0–46.0)
HEMOGLOBIN: 13.6 g/dL (ref 12.0–15.0)
MCH: 27.9 pg (ref 26.0–34.0)
MCHC: 30.3 g/dL (ref 30.0–36.0)
MCV: 92.2 fL (ref 78.0–100.0)
Platelets: 198 10*3/uL (ref 150–400)
RBC: 4.87 MIL/uL (ref 3.87–5.11)
RDW: 15.4 % (ref 11.5–15.5)
WBC: 7.7 10*3/uL (ref 4.0–10.5)

## 2016-12-04 LAB — BASIC METABOLIC PANEL
ANION GAP: 6 (ref 5–15)
BUN: 30 mg/dL — ABNORMAL HIGH (ref 6–20)
CALCIUM: 9.6 mg/dL (ref 8.9–10.3)
CO2: 30 mmol/L (ref 22–32)
Chloride: 102 mmol/L (ref 101–111)
Creatinine, Ser: 1.07 mg/dL — ABNORMAL HIGH (ref 0.44–1.00)
GFR calc Af Amer: >60 mL/min (ref >60)
GFR, EST NON AFRICAN AMERICAN: 52 mL/min — AB (ref >60)
GLUCOSE: 122 mg/dL — AB (ref 65–99)
Potassium: 4.6 mmol/L (ref 3.5–5.1)
Sodium: 138 mmol/L (ref 135–145)

## 2016-12-04 NOTE — Care Management Note (Signed)
Case Management Note  Patient Details  Name: Janice Morrow MRN: [id number] Date of Birth: 28-Feb-1946  Subjective/Objective:     Admitted with CHF              Action/Plan: Patient lives with her brother in a [place]; PCP: [name], MD; has private insurance with Medicare/ Medicaid with prescription drug coverage; pharmacy of choice [facility]; DME - cane ; Barriers to discharge- Patient * Patient need home oxygen but is refusing stating that her brother smokes, she lives with him and he pays the rent and it is inappropriate for her to ask him to step outside and smoke while she is on oxygen. She does not like the large oxygen tanks; she has had oxygen in the past and turned it back to the DME company.  *Patient needs a new CPAP machine; patient stated that her CPAP is old and wants a new one; she reports having a sleep study around 6 yrs ago. Patient is refusing to have another sleep study done. CM informed patient that in order for insurance to cover the payment for another CPAP she must meet the criteria for having a CPAP; Patient stated that she is not going to have another sleep study done. *Patient could benefit from [facility] services; patient refused, stated that she does not want anyone going to her home and do nothing. *SW to see patient to give her resources on housing. CM will continue to see pt for DCP;   Expected Discharge Date:12/04/2016             Expected Discharge Plan:  [place]  In-House Referral:  Clinical Social Work  Discharge planning Services  CM Consult Choice offered to:  Patient  HH Arranged:  Patient Refused HH  Status of Service:  In process, will continue to follow  Royston Bake, RN 12/04/2016, 10:17 AM

## 2016-12-04 NOTE — Discharge Instructions (Signed)
Heart Failure Heart failure means your heart has trouble pumping blood. This makes it hard for your body to work well. Heart failure is usually a long-term (chronic) condition. You must take good care of yourself and follow your doctor's treatment plan. Follow these instructions at home:  Take your heart medicine as told by your doctor.  Do not stop taking medicine unless your doctor tells you to.  Do not skip any dose of medicine.  Refill your medicines before they run out.  Take other medicines only as told by your doctor or pharmacist.  Stay active if told by your doctor. The elderly and people with severe heart failure should talk with a doctor about physical activity.  Eat heart-healthy foods. Choose foods that are without trans fat and are low in saturated fat, cholesterol, and salt (sodium). This includes fresh or frozen fruits and vegetables, fish, lean meats, fat-free or low-fat dairy foods, whole grains, and high-fiber foods. Lentils and dried peas and beans (legumes) are also good choices.  Limit salt if told by your doctor.  Daus in a healthy way. Roast, grill, broil, bake, poach, steam, or stir-fry foods.  Limit fluids as told by your doctor.  Weigh yourself every morning. Do this after you pee (urinate) and before you eat breakfast. Write down your weight to give to your doctor.  Take your blood pressure and write it down if your doctor tells you to.  Ask your doctor how to check your pulse. Check your pulse as told.  Lose weight if told by your doctor.  Stop smoking or chewing tobacco. Do not use gum or patches that help you quit without your doctor's approval.  Schedule and go to doctor visits as told.  Nonpregnant women should have no more than 1 drink a day. Men should have no more than 2 drinks a day. Talk to your doctor about drinking alcohol.  Stop illegal drug use.  Stay current with shots (immunizations).  Manage your health conditions as told by your  doctor.  Learn to manage your stress.  Rest when you are tired.  If it is really hot outside:  Avoid intense activities.  Use air conditioning or fans, or get in a cooler place.  Avoid caffeine and alcohol.  Wear loose-fitting, lightweight, and light-colored clothing.  If it is really cold outside:  Avoid intense activities.  Layer your clothing.  Wear mittens or gloves, a hat, and a scarf when going outside.  Avoid alcohol.  Learn about heart failure and get support as needed.  Get help to maintain or improve your quality of life and your ability to care for yourself as needed. Contact a doctor if:  You gain weight quickly.  You are more short of breath than usual.  You cannot do your normal activities.  You tire easily.  You cough more than normal, especially with activity.  You have any or more puffiness (swelling) in areas such as your hands, feet, ankles, or belly (abdomen).  You cannot sleep because it is hard to breathe.  You feel like your heart is beating fast (palpitations).  You get dizzy or light-headed when you stand up. Get help right away if:  You have trouble breathing.  There is a change in mental status, such as becoming less alert or not being able to focus.  You have chest pain or discomfort.  You faint. This information is not intended to replace advice given to you by your health care provider. Make sure you  discuss any questions you have with your health care provider. Document Released: 05/22/2008 Document Revised: 01/19/2016 Document Reviewed: 09/29/2012 Elsevier Interactive Patient Education  2017 [employer].   Kidney Stones Kidney stones (urolithiasis) are rock-like masses that form inside of the kidneys. Kidneys are organs that make pee (urine). A kidney stone can cause very bad pain and can block the flow of pee. The stone usually leaves your body (passes) through your pee. You may need to have a doctor take out the  stone. Follow these instructions at home: Eating and drinking   Drink enough fluid to keep your pee clear or pale yellow. This will help you pass the stone.  If told by your doctor, change the foods you eat (your diet). This may include:  Limiting how much salt (sodium) you eat.  Eating more fruits and vegetables.  Limiting how much meat, poultry, fish, and eggs you eat.  Follow instructions from your doctor about eating or drinking restrictions. General instructions   Collect pee samples as told by your doctor. You may need to collect a pee sample:  24 hours after a stone comes out.  8-12 weeks after a stone comes out, and every 6-12 months after that.  Strain your pee every time you pee (urinate), for as long as told. Use the strainer that your doctor recommends.  Do not throw out the stone. Keep it so that it can be tested by your doctor.  Take over-the-counter and prescription medicines only as told by your doctor.  Keep all follow-up visits as told by your doctor. This is important. You may need follow-up tests. Preventing kidney stones  To prevent another kidney stone:  Drink enough fluid to keep your pee clear or pale yellow. This is the best way to prevent kidney stones.  Eat healthy foods.  Avoid certain foods as told by your doctor. You may be told to eat less protein.  Stay at a healthy weight. Contact a doctor if:  You have pain that gets worse or does not get better with medicine. Get help right away if:  You have a fever or chills.  You get very bad pain.  You get new pain in your belly (abdomen).  You pass out (faint).  You cannot pee. This information is not intended to replace advice given to you by your health care provider. Make sure you discuss any questions you have with your health care provider. Document Released: 01/30/2008 Document Revised: 05/01/2016 Document Reviewed: 05/01/2016 Elsevier Interactive Patient Education  2017 [employer].

## 2016-12-04 NOTE — Clinical Social Work Note (Addendum)
CSW and CSW intern met with patient. Patient did not want to speak to CSW at that time and was focused to getting pads and cleaning up. CSW will try again later.  Dayton Scrape, [place] [phone]  12:56 pm CSW and CSW intern met with patient. CSW provided list of low-income housing options and assistance programs within [facility]. Patient upset that CSW cannot "follow through" and get her an apartment. Patient stated that the papers would do her no good. Patient then began talking about the "failed system" and the decrease in her food stamps.  CSW signing off.  Dayton Scrape, [place]

## 2016-12-04 NOTE — Consult Note (Signed)
   [facility] CM Inpatient Consult   12/04/2016  Janice Morrow 05/15/1946 [id number]  Met with the patient from a referral for possible [place] [facility] for resources.  Spoke at length with patient regarding [facility] Management for post hospital follow up and resources. Patient states,"Baby, nobody can help me, [place] and all the money used to help people is going to these girls who are having babies and getting food stamps to feed an army and they cut my food stamps from $200.00 a month to $137.00. So, don't tell me they or anybody wants to help those who have paid into this system.  They want to help the ones with contnuing to have babies and selling drugs or selling their food stamps NOT someone who has actually worked an honest job for 33 years."  Explained that [place] Management can offer assistance at no cost with finding resources.  She states, "what good is that going to do, I CAN't find an apartment I live with my brother in a hotel for over $700.00 a month. It's the social workers 'job' to find the help and they should do all the paper work not me."  Explained that working 'with' someone to get resources to assist with self care and maintenance.  Social worker came by and patient getting upset further stating, "this is making me too upset I have a terrible headache."  Patient called for medications. Patient states she will accept automated calls.  Brochure with contact information was accepted.  For questions, please contact:  Natividad Brood, RN BSN [facility] Liaison  [phone] business mobile phone Toll free office [phone]

## 2016-12-04 NOTE — Discharge Summary (Addendum)
Physician Discharge Summary  WHITTANY PARISH ZLD:[id number] DOB: 23-Feb-1946 DOA: 11/30/2016  PCP: [name], MD  Admit date: 11/30/2016 Discharge date: 12/04/2016  Time spent: 45 minutes  Recommendations for Outpatient Follow-up:  Patient will be discharged to home.  Patient will need to follow up with primary care provider within one week of discharge, repeat BMP. Follow up with urology, Dr. Meriam Sprague, for kidney stone.  Patient should continue medications as prescribed.  Patient should follow a heart healthy diet.   Patient is high risk for readmission as she has refused home health services, oxygen, etc.  Discharge Diagnoses:  Acute on chronic diastolic congestive heart failure with Acute hypoxic respiratory failure Chest pain Renal stone/Abdominal pain/Constipation Acute bronchitis/COPD OSA Chronic back pain Chronic kidney disease, stage III  Discharge Condition: Stable   Diet recommendation: heart healthy  Filed Weights   12/02/16 0639 12/03/16 0519 12/04/16 0434  Weight: (!) 146.6 kg (323 lb 4.8 oz) (!) 147.8 kg (325 lb 14.4 oz) (!) 147.6 kg (325 lb 6.4 oz)    History of present illness:  on 11/30/2016 by Dr. Laqueta Carina Cookis a 71 y.o.femalewith medical history significant of Diastolic CHF, COPD, OSA, HTN. Patient presents to the ED with 1 week history of cough and chest pain worse with coughing. Cough is productive of sputum. Nothing makes better or worse. Cough is persistent. No fevers, chills, N/V/D. Of note patient took herself off of her lasix 2 weeks ago.  Hospital Course:  Acute on chronic diastolic congestive heart failure with Acute hypoxic respiratory failure -O2 sats down to 86%, continue nasal canula to maintain sat>90% -History of OSA, compliant with CPAP during hospitalization  -Echcoardiogram: EF 90-30%, grade 1 diastolic dysfunction -patient admits to not taking her lasix at home as she was ill -Continue to monitor intake/output, daily  weights -Urine output 1960cc over past 24 hours -continue incentive spirometry -Patient was noted to have oxygen saturations of 86% on room air with ambulation. Discussed the need for home oxygen however patient adamantly refused and was yelling regarding that her brother smokes in the house and she would not tell him to stop. She also went on to say that she did not want to have oxygen tank in her house. -Will discharge with PO lasix -Repeat BMP in one week  Chest pain -Noncardiac, troponins cycled and unremarkable -Echocardiogram as above, EKG unremarkable. -D-dimer mildly elevated but possibility of PE is less likely. LE doppler unremarkable for DVT -Normal ESR and CRP notsuggestive of any pericarditis.  Renal stone/Abdominal pain/Constipation -Patient complains about abdominal pain -CT renal study: Staghorn calculus of the left kidney measuring 2.8 x 1.9 x 1.2cm.  -X-ray abdomen shows significant amount of constipation as well as a large staghorn on the left kidney. -Constipation resolving, having BM -Discussed with Dr. Alinda Money, urology, calculus will need outpatient follow up  Acute bronchitis/COPD -Continue azithromycin, currently no wheezing. -Continue [place]  OSA -Noncompliant with CPAP at home -Spoke with Case management. It appears patient had a sleep study a proximally 6 years ago and has a CPAP machine that is old and wanted a new one. Patient is refusing to have another study the study conducted. Patient was informed that in order to obtain a new  CPAP machine she will need to have another study, and she has refused.   Chronic back pain -has neuropathy and on gabapentin  Chronic kidney disease, stage III -Creatinine mildly increased, but GFR at baseline -Continue to monitor BMP  Deconditioning -PT consulted and  did recommend home health services and a rolling walker however patient refused and did not want anyone going into her home  Morbid Obesity -Follow up  with PCP to discuss lifestyle modifications -BMI >50  Consultants Dr. Alinda Money, Urology, via phone  Procedures  Echocardiogram LE doppler  Discharge Exam: Vitals:   12/04/16 0434 12/04/16 1000  BP: (!) 122/57 111/74  Pulse: 90 94  Resp: 18   Temp:  98.5 F (36.9 C)    Exam  General: Well developed, well nourished, NAD, appears stated age  71: NCAT, mucous membranes moist.   Cardiovascular: S1 S2 auscultated, RRR  Respiratory: Diminshed but clear  Abdomen: Soft, nontender, nondistended, + bowel sounds  Extremities: warm dry without cyanosis clubbing, trace LE edema-improved.   Neuro: AAOx3, nonfocal  Discharge Instructions Discharge Instructions    Discharge instructions    Complete by:  As directed    Patient will be discharged to home.  Patient will need to follow up with primary care provider within one week of discharge, repeat BMP. Follow up with urology, Dr. Meriam Sprague, for kidney stone.  Patient should continue medications as prescribed.  Patient should follow a heart healthy diet.     Current Discharge Medication List    CONTINUE these medications which have NOT CHANGED   Details  ANORO ELLIPTA 62.5-25 MCG/INH AEPB INHALE 1 PUFF INTO THE LUNGS DAILY. Qty: 60 each, Refills: 11   Associated Diagnoses: Simple chronic bronchitis (HCC)    gabapentin (NEURONTIN) 400 MG capsule Take 400-1,200 mg by mouth 3 (three) times daily. TAKE 1 CAPSULE AT LUNCH, TAKE 2 CAPSULES AT SUPPER AND 3 CAPSULES AT BEDTIME    HYDROcodone-acetaminophen (NORCO) 10-325 MG tablet Take 1 tablet by mouth every 6 (six) hours as needed. Qty: 90 tablet, Refills: 0   Associated Diagnoses: Chronic back pain    tiZANidine (ZANAFLEX) 4 MG tablet 1 TABLET TWICE A DAY AS NEEDED Refills: 2    furosemide (LASIX) 40 MG tablet Take 1 tablet (40 mg total) by mouth daily. Qty: 90 tablet, Refills: 1   Associated Diagnoses: Essential hypertension; Left ventricular diastolic dysfunction, NYHA class 1         Allergies  Allergen Reactions  . Baclofen Other (See Comments)    Causes seizures   Follow-up Information    [name], MD. Schedule an appointment as soon as possible for a visit in 1 week(s).   Specialty:  Internal Medicine Why:  Hospital follow-up Contact information: [address] [address] [phone]        Dutch Gray, MD. Schedule an appointment as soon as possible for a visit.   Specialty:  Urology Why:  Renal stone Contact information: [place] [address] [phone]            The results of significant diagnostics from this hospitalization (including imaging, microbiology, ancillary and laboratory) are listed below for reference.    Significant Diagnostic Studies: Dg Chest 2 View  Result Date: 11/30/2016 CLINICAL DATA:  Chest pain for 2 weeks, LEFT arm pain. History of CHF, COPD. EXAM: CHEST  2 VIEW COMPARISON:  Chest radiograph October 24, 2013 FINDINGS: The cardiac silhouette is moderately enlarged and unchanged. Similar chronic interstitial changes and pulmonary vascular congestion without pleural effusion or focal consolidation. Increased lung volumes with flattened hemidiaphragms. No pneumothorax. Severe degenerative change of the LEFT shoulder. Large body habitus. IMPRESSION: Stable cardiomegaly, pulmonary vascular congestion and COPD. Electronically Signed   By: Thana Farr.D.  On: 11/30/2016 20:49   Dg Knee Complete 4 Views Right  Result Date: 11/16/2016 CLINICAL DATA:  Chronic right knee pain, initial encounter EXAM: RIGHT KNEE - COMPLETE 4+ VIEW COMPARISON:  10/16/2011 FINDINGS: Significant tricompartmental degenerative changes noted worst in the medial joint compartment. These changes have increased somewhat in the interval from the prior exam particularly in the patellofemoral space. No significant joint effusion is seen. No other soft tissue abnormality is noted. IMPRESSION:  Tricompartmental degenerative change without acute abnormality. Electronically Signed   By: Inez Catalina M.D.   On: 11/16/2016 15:21   Dg Abd 2 Views  Result Date: 12/02/2016 CLINICAL DATA:  Generalized abdominal pain for 2 weeks. EXAM: ABDOMEN - 2 VIEW COMPARISON:  None. FINDINGS: No free air, portal venous gas, or pneumatosis. An irregular shaped 3 cm calcification in the left side of the abdomen could be within the left kidney or left renal pelvis. This could represent a staghorn calculus. Fecal loading seen in the colon. No bowel obstruction. Surgical changes seen in the right lower abdomen. IMPRESSION: 1. 3 cm calcification in the left side of the abdomen could represent a staghorn renal calculus. 2. Fecal loading in the colon.  No bowel obstruction. Electronically Signed   By: Dorise Bullion III M.D   On: 12/02/2016 11:56   Ct Renal Stone Study  Result Date: 12/02/2016 CLINICAL DATA:  Evaluation for left renal stone. Cough and phlegm with chest pain. EXAM: CT ABDOMEN AND PELVIS WITHOUT CONTRAST TECHNIQUE: Multidetector CT imaging of the abdomen and pelvis was performed following the standard protocol without IV contrast. COMPARISON:  None. FINDINGS: Lower chest: Borderline cardiomegaly. Minimal patchy atelectasis and/or infiltrate posteriorly in the right lower lobe. Subsegmental atelectasis in the lingula. Hepatobiliary: No focal liver abnormality is seen. No gallstones, gallbladder wall thickening, or biliary dilatation. Pancreas: Unremarkable. No pancreatic ductal dilatation or surrounding inflammatory changes. Spleen: Normal in size without focal abnormality. Adrenals/Urinary Tract: 2.8 x 1.9 x 1.2 cm staghorn calculus in left renal pelvis with smaller interpolar and left upper pole calculi noted. 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 The right kidney is demonstrates an  interpolar 1.8 cm cyst laterally. No hydroureter. Urinary bladder is contracted. Stomach/Bowel: Contracted stomach. Normal small bowel rotation without inflammation or obstruction. Moderate colonic stool burden throughout large bowel consistent with constipation. Normal appendix. Vascular/Lymphatic: Aortic atherosclerosis. No enlarged abdominal or pelvic lymph nodes. Reproductive: Uterus and bilateral adnexa are unremarkable. Other: Small ventral mesh repair of umbilical hernia. Musculoskeletal: Degenerative disc disease L5-S1. No acute nor suspicious osseous lesions. IMPRESSION: 1. Staghorn calculus of the left kidney measuring 2.8 x 1.9 x 1.2 cm. 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 Smaller nonobstructing renal calculi are seen in the interpolar and upper pole of the left kidney. 2. There is atelectasis posteriorly in the right lower lobe. Superimposed pneumonia is not entirely excluded. 3. Moderate colonic stool burden consistent with constipation. 4. Aortic atherosclerosis. 5. Degenerative disc disease L5-S1. Electronically Signed   By: Ashley Royalty M.D.   On: 12/02/2016 20:46    Microbiology: No results found for this or any previous visit (from the past 240 hour(s)).   Labs: Basic Metabolic Panel:  Recent Labs Lab 11/30/16 2009 12/01/16 0301 12/02/16 0146 12/03/16 0453 12/04/16 0512  NA  137 138 137 137 138  K 4.3 3.7 4.2 4.5 4.6  CL 104 100* 103 99* 102  CO2 '24 26 28 27 30  ' GLUCOSE 118* 159* 133* 129* 122*  BUN 11 15 29* 34* 30*  CREATININE 1.02* 1.09* 1.33* 1.24* 1.07*  CALCIUM 9.3 9.3 9.4 9.7 9.6  MG 2.0  --   --  2.3  --    Liver Function Tests:  Recent Labs Lab 12/02/16 1053  AST 15  ALT 14  ALKPHOS 54  BILITOT 0.7  PROT 6.8  ALBUMIN 2.8*    Recent Labs Lab 12/02/16 1053    LIPASE 17   No results for input(s): AMMONIA in the last 168 hours. CBC:  Recent Labs Lab 11/30/16 2009 12/03/16 0453 12/04/16 0512  WBC 7.0 6.8 7.7  HGB 14.7 13.7 13.6  HCT 48.7* 46.8* 44.9  MCV 90.9 92.5 92.2  PLT 181 201 198   Cardiac Enzymes:  Recent Labs Lab 11/30/16 2009  TROPONINI <0.03   BNP: BNP (last 3 results)  Recent Labs  11/30/16 2009  BNP 127.6*    ProBNP (last 3 results)  Recent Labs  03/14/16 1522  PROBNP 35.0    CBG: No results for input(s): GLUCAP in the last 168 hours.     SignedCristal Ford  [facility] 12/04/2016, 11:02 AM

## 2016-12-06 ENCOUNTER — Inpatient Hospital Stay: Payer: Medicare Other | Admitting: Internal Medicine

## 2016-12-06 ENCOUNTER — Telehealth: Payer: Self-pay | Admitting: *Deleted

## 2016-12-06 NOTE — Telephone Encounter (Signed)
Transition Care Management Follow-up Telephone Call   Date discharged? 12/04/16   How have you been since you were released from the hospital? Pt states she seem to be doing ok   Do you understand why you were in the hospital? YES   Do you understand the discharge instructions? YES   Where were you discharged to? Home   Items Reviewed:  Medications reviewed: YES  Allergies reviewed: YES  Dietary changes reviewed: heart healthy  Referrals reviewed: No referral needed   Functional Questionnaire:   Activities of Daily Living (ADLs):   She states she are independent in the following: ambulation, bathing and hygiene, feeding, continence, grooming, toileting and dressing States she require assistance with the following: ambulation   Any transportation issues/concerns?: NO   Any patient concerns? YES   Confirmed importance and date/time of follow-up visits scheduled YES, appt 12/18/16 she states she could not come in sooner  Provider Appointment booked with 12/18/16  Confirmed with patient if condition begins to worsen call PCP or go to the ER.  Patient was given the office number and encouraged to call back with question or concerns.  : YES

## 2016-12-18 ENCOUNTER — Inpatient Hospital Stay: Payer: Medicare Other | Admitting: Internal Medicine

## 2016-12-20 DIAGNOSIS — Z79899 Other long term (current) drug therapy: Secondary | ICD-10-CM | POA: Diagnosis not present

## 2016-12-20 DIAGNOSIS — M1990 Unspecified osteoarthritis, unspecified site: Secondary | ICD-10-CM | POA: Diagnosis not present

## 2016-12-20 DIAGNOSIS — M4696 Unspecified inflammatory spondylopathy, lumbar region: Secondary | ICD-10-CM | POA: Diagnosis not present

## 2016-12-20 DIAGNOSIS — M25569 Pain in unspecified knee: Secondary | ICD-10-CM | POA: Diagnosis not present

## 2016-12-20 DIAGNOSIS — G894 Chronic pain syndrome: Secondary | ICD-10-CM | POA: Diagnosis not present

## 2016-12-20 DIAGNOSIS — M47817 Spondylosis without myelopathy or radiculopathy, lumbosacral region: Secondary | ICD-10-CM | POA: Diagnosis not present

## 2016-12-20 DIAGNOSIS — M5137 Other intervertebral disc degeneration, lumbosacral region: Secondary | ICD-10-CM | POA: Diagnosis not present

## 2016-12-20 DIAGNOSIS — Z79891 Long term (current) use of opiate analgesic: Secondary | ICD-10-CM | POA: Diagnosis not present

## 2017-01-16 ENCOUNTER — Telehealth: Payer: Self-pay | Admitting: Internal Medicine

## 2017-01-16 ENCOUNTER — Inpatient Hospital Stay (HOSPITAL_COMMUNITY)
Admission: EM | Admit: 2017-01-16 | Discharge: 2017-01-19 | DRG: 175 | Disposition: A | Discharge status: Home / Self Care | Payer: Medicare Other | Attending: Internal Medicine | Admitting: Internal Medicine

## 2017-01-16 ENCOUNTER — Emergency Department (HOSPITAL_COMMUNITY): Payer: Medicare Other

## 2017-01-16 ENCOUNTER — Encounter (HOSPITAL_COMMUNITY): Payer: Self-pay | Admitting: Emergency Medicine

## 2017-01-16 DIAGNOSIS — N200 Calculus of kidney: Secondary | ICD-10-CM

## 2017-01-16 DIAGNOSIS — R42 Dizziness and giddiness: Secondary | ICD-10-CM | POA: Diagnosis not present

## 2017-01-16 DIAGNOSIS — J449 Chronic obstructive pulmonary disease, unspecified: Secondary | ICD-10-CM | POA: Diagnosis present

## 2017-01-16 DIAGNOSIS — I10 Essential (primary) hypertension: Secondary | ICD-10-CM | POA: Diagnosis present

## 2017-01-16 DIAGNOSIS — R0902 Hypoxemia: Secondary | ICD-10-CM | POA: Diagnosis not present

## 2017-01-16 DIAGNOSIS — N183 Chronic kidney disease, stage 3 (moderate): Secondary | ICD-10-CM | POA: Diagnosis present

## 2017-01-16 DIAGNOSIS — R0602 Shortness of breath: Secondary | ICD-10-CM | POA: Diagnosis not present

## 2017-01-16 DIAGNOSIS — G8929 Other chronic pain: Secondary | ICD-10-CM | POA: Diagnosis present

## 2017-01-16 DIAGNOSIS — R569 Unspecified convulsions: Secondary | ICD-10-CM | POA: Diagnosis present

## 2017-01-16 DIAGNOSIS — N390 Urinary tract infection, site not specified: Secondary | ICD-10-CM | POA: Diagnosis present

## 2017-01-16 DIAGNOSIS — Z9989 Dependence on other enabling machines and devices: Secondary | ICD-10-CM | POA: Diagnosis not present

## 2017-01-16 DIAGNOSIS — J9601 Acute respiratory failure with hypoxia: Secondary | ICD-10-CM | POA: Diagnosis not present

## 2017-01-16 DIAGNOSIS — R079 Chest pain, unspecified: Secondary | ICD-10-CM | POA: Diagnosis not present

## 2017-01-16 DIAGNOSIS — J9621 Acute and chronic respiratory failure with hypoxia: Secondary | ICD-10-CM | POA: Diagnosis present

## 2017-01-16 DIAGNOSIS — E662 Morbid (severe) obesity with alveolar hypoventilation: Secondary | ICD-10-CM | POA: Diagnosis present

## 2017-01-16 DIAGNOSIS — G4733 Obstructive sleep apnea (adult) (pediatric): Secondary | ICD-10-CM | POA: Diagnosis present

## 2017-01-16 DIAGNOSIS — Z86711 Personal history of pulmonary embolism: Secondary | ICD-10-CM | POA: Diagnosis not present

## 2017-01-16 DIAGNOSIS — J441 Chronic obstructive pulmonary disease with (acute) exacerbation: Secondary | ICD-10-CM | POA: Diagnosis present

## 2017-01-16 DIAGNOSIS — N132 Hydronephrosis with renal and ureteral calculous obstruction: Secondary | ICD-10-CM | POA: Diagnosis present

## 2017-01-16 DIAGNOSIS — M79609 Pain in unspecified limb: Secondary | ICD-10-CM | POA: Diagnosis not present

## 2017-01-16 DIAGNOSIS — I5033 Acute on chronic diastolic (congestive) heart failure: Secondary | ICD-10-CM | POA: Diagnosis present

## 2017-01-16 DIAGNOSIS — Z6841 Body Mass Index (BMI) 40.0 and over, adult: Secondary | ICD-10-CM

## 2017-01-16 DIAGNOSIS — I130 Hypertensive heart and chronic kidney disease with heart failure and stage 1 through stage 4 chronic kidney disease, or unspecified chronic kidney disease: Secondary | ICD-10-CM | POA: Diagnosis present

## 2017-01-16 DIAGNOSIS — I2699 Other pulmonary embolism without acute cor pulmonale: Secondary | ICD-10-CM | POA: Diagnosis not present

## 2017-01-16 DIAGNOSIS — R404 Transient alteration of awareness: Secondary | ICD-10-CM | POA: Diagnosis not present

## 2017-01-16 LAB — COMPREHENSIVE METABOLIC PANEL
ALT: 32 U/L (ref 14–54)
AST: 24 U/L (ref 15–41)
Albumin: 3.5 g/dL (ref 3.5–5.0)
Alkaline Phosphatase: 51 U/L (ref 38–126)
Anion gap: 10 (ref 5–15)
BUN: 15 mg/dL (ref 6–20)
CHLORIDE: 102 mmol/L (ref 101–111)
CO2: 22 mmol/L (ref 22–32)
Calcium: 8.9 mg/dL (ref 8.9–10.3)
Creatinine, Ser: 1.08 mg/dL — ABNORMAL HIGH (ref 0.44–1.00)
GFR calc Af Amer: 60 mL/min — ABNORMAL LOW (ref >60)
GFR, EST NON AFRICAN AMERICAN: 52 mL/min — AB (ref >60)
Glucose, Bld: 110 mg/dL — ABNORMAL HIGH (ref 65–99)
POTASSIUM: 3.7 mmol/L (ref 3.5–5.1)
Sodium: 134 mmol/L — ABNORMAL LOW (ref 135–145)
Total Bilirubin: 0.9 mg/dL (ref 0.3–1.2)
Total Protein: 6.9 g/dL (ref 6.5–8.1)

## 2017-01-16 LAB — CBC WITH DIFFERENTIAL/PLATELET
BASOS ABS: 0 10*3/uL (ref 0.0–0.1)
Basophils Relative: 0 %
EOS ABS: 0 10*3/uL (ref 0.0–0.7)
EOS PCT: 0 %
HCT: 43.8 % (ref 36.0–46.0)
Hemoglobin: 13.9 g/dL (ref 12.0–15.0)
LYMPHS PCT: 30 %
Lymphs Abs: 2.7 10*3/uL (ref 0.7–4.0)
MCH: 28.8 pg (ref 26.0–34.0)
MCHC: 31.7 g/dL (ref 30.0–36.0)
MCV: 90.9 fL (ref 78.0–100.0)
Monocytes Absolute: 0.5 10*3/uL (ref 0.1–1.0)
Monocytes Relative: 6 %
Neutro Abs: 5.8 10*3/uL (ref 1.7–7.7)
Neutrophils Relative %: 64 %
PLATELETS: 176 10*3/uL (ref 150–400)
RBC: 4.82 MIL/uL (ref 3.87–5.11)
RDW: 15 % (ref 11.5–15.5)
WBC: 9.1 10*3/uL (ref 4.0–10.5)

## 2017-01-16 LAB — I-STAT TROPONIN, ED: TROPONIN I, POC: 0 ng/mL (ref 0.00–0.08)

## 2017-01-16 LAB — BRAIN NATRIURETIC PEPTIDE: B NATRIURETIC PEPTIDE 5: 47.3 pg/mL (ref 0.0–100.0)

## 2017-01-16 MED ORDER — GABAPENTIN 400 MG PO CAPS
800.0000 mg | ORAL_CAPSULE | Freq: Every day | ORAL | Status: DC
  Administered 2017-01-17 – 2017-01-18 (×2): 800 mg via ORAL
  Filled 2017-01-16 (×2): qty 2

## 2017-01-16 MED ORDER — HEPARIN BOLUS VIA INFUSION
5500.0000 [IU] | Freq: Once | INTRAVENOUS | Status: AC
  Administered 2017-01-16: 5500 [IU] via INTRAVENOUS
  Filled 2017-01-16: qty 5500

## 2017-01-16 MED ORDER — FUROSEMIDE 10 MG/ML IJ SOLN
40.0000 mg | Freq: Once | INTRAMUSCULAR | Status: AC
  Administered 2017-01-17: 40 mg via INTRAVENOUS
  Filled 2017-01-16: qty 4

## 2017-01-16 MED ORDER — IOPAMIDOL (ISOVUE-370) INJECTION 76%
INTRAVENOUS | Status: AC
  Administered 2017-01-16: 100 mL
  Filled 2017-01-16: qty 100

## 2017-01-16 MED ORDER — ACETAMINOPHEN 650 MG RE SUPP: 650.0000 mg | Freq: Four times a day (QID) | RECTAL | Status: DC | PRN

## 2017-01-16 MED ORDER — TIZANIDINE HCL 4 MG PO TABS
4.0000 mg | ORAL_TABLET | Freq: Three times a day (TID) | ORAL | Status: DC | PRN
Start: 2017-01-16 — End: 2017-01-19
  Administered 2017-01-17 (×2): 4 mg via ORAL
  Filled 2017-01-16 (×2): qty 1

## 2017-01-16 MED ORDER — HYDROCODONE-ACETAMINOPHEN 10-325 MG PO TABS
1.0000 | ORAL_TABLET | Freq: Four times a day (QID) | ORAL | Status: DC | PRN
  Administered 2017-01-17 – 2017-01-18 (×3): 1 via ORAL
  Filled 2017-01-16 (×3): qty 1

## 2017-01-16 MED ORDER — ONDANSETRON HCL 4 MG/2ML IJ SOLN: 4.0000 mg | Freq: Four times a day (QID) | INTRAMUSCULAR | Status: DC | PRN

## 2017-01-16 MED ORDER — HEPARIN (PORCINE) IN NACL 100-0.45 UNIT/ML-% IJ SOLN
1700.0000 [IU]/h | INTRAMUSCULAR | Status: DC
  Administered 2017-01-16: 1500 [IU]/h via INTRAVENOUS
  Filled 2017-01-16: qty 250

## 2017-01-16 MED ORDER — GABAPENTIN 400 MG PO CAPS
1200.0000 mg | ORAL_CAPSULE | Freq: Every day | ORAL | Status: DC
  Administered 2017-01-17 – 2017-01-18 (×3): 1200 mg via ORAL
  Filled 2017-01-16 (×3): qty 3

## 2017-01-16 MED ORDER — ACETAMINOPHEN 325 MG PO TABS
650.0000 mg | ORAL_TABLET | Freq: Four times a day (QID) | ORAL | Status: DC | PRN
Start: 2017-01-16 — End: 2017-01-19
  Administered 2017-01-18: 650 mg via ORAL
  Filled 2017-01-16: qty 2

## 2017-01-16 MED ORDER — UMECLIDINIUM-VILANTEROL 62.5-25 MCG/INH IN AEPB
1.0000 | INHALATION_SPRAY | Freq: Every day | RESPIRATORY_TRACT | Status: DC
  Administered 2017-01-18 – 2017-01-19 (×2): 1 via RESPIRATORY_TRACT
  Filled 2017-01-16: qty 14

## 2017-01-16 MED ORDER — GABAPENTIN 400 MG PO CAPS
400.0000 mg | ORAL_CAPSULE | Freq: Every day | ORAL | Status: DC
  Administered 2017-01-17 – 2017-01-19 (×3): 400 mg via ORAL
  Filled 2017-01-16 (×3): qty 1

## 2017-01-16 MED ORDER — ONDANSETRON HCL 4 MG PO TABS: 4.0000 mg | ORAL_TABLET | Freq: Four times a day (QID) | ORAL | Status: DC | PRN

## 2017-01-16 MED ORDER — GABAPENTIN 400 MG PO CAPS: 400.0000 mg | ORAL_CAPSULE | Freq: Three times a day (TID) | ORAL | Status: DC

## 2017-01-16 MED ORDER — METHYLPREDNISOLONE SODIUM SUCC 125 MG IJ SOLR
125.0000 mg | Freq: Once | INTRAMUSCULAR | Status: AC
  Administered 2017-01-16: 125 mg via INTRAVENOUS
  Filled 2017-01-16: qty 2

## 2017-01-16 MED ORDER — ALBUTEROL SULFATE (2.5 MG/3ML) 0.083% IN NEBU
5.0000 mg | INHALATION_SOLUTION | Freq: Once | RESPIRATORY_TRACT | Status: AC
  Administered 2017-01-16: 5 mg via RESPIRATORY_TRACT
  Filled 2017-01-16: qty 6

## 2017-01-16 NOTE — Telephone Encounter (Signed)
Pt called stating she was seen at pain management and her O2 was 82. She was advised to go to the ED right away. Pt understood and will be going right now.

## 2017-01-16 NOTE — ED Provider Notes (Signed)
[facility] DEPT Provider Note   CSN: [id number] Arrival date & time: 01/16/17  1700     History   Chief Complaint Chief Complaint  Patient presents with  . Shortness of Breath    HPI Janice Morrow is a 71 y.o. female.  HPI Patient with history of diastolic CHF, COPD presents for hypoxemia. Patient went to a pain clinic and O2 sat was noted to be 81%. She was placed on 2 L of oxygen via nasal cannula with improvement of saturations. Patient states he's not been able to wear oxygen at home because her brother smokes. She does use CPAP at night. Says she's had ongoing shortness of breath which is worse in the last few days. She has a productive cough of yellow sputum. Denies fever or chills. She continues to have bilateral lower extremity swelling. Past Medical History:  Diagnosis Date  . Acute diastolic CHF (congestive heart failure) ([place]) 08/24/2015  . Cervicalgia   . Chronic back pain   . COPD (chronic obstructive pulmonary disease) ([place])   . Essential hypertension 08/24/2015  . GERD (gastroesophageal reflux disease)   . Headache(784.0)   . Heart murmur   . Hyperlipidemia   . Migraine without aura, with intractable migraine, so stated, without mention of status migrainosus   . OSA on CPAP   . Osteoarthritis   . PVD (peripheral vascular disease) ([place])   . PVD (peripheral vascular disease) ([place])   . Seizures ([place]) 07/2014, 10/2014  . Shortness of breath dyspnea   . Trigeminal neuralgia     Patient Active Problem List   Diagnosis Date Noted  . Acute respiratory failure with hypoxia ([place]) 01/16/2017  . Abdominal pain   . Kidney stone on left side   . Acute on chronic diastolic CHF (congestive heart failure) ([place]) 11/30/2016  . History of colonic polyps 07/31/2016  . Essential hypertension 08/24/2015  . Seizures ([place]) 08/24/2015  . Obesity hypoventilation syndrome ([place]) 08/24/2015  . Migraine without aura and with status migrainosus, not intractable 07/12/2015  .  OSA on CPAP 07/12/2015  . Left ventricular diastolic dysfunction, NYHA class 1 04/11/2015  . Prediabetes 04/11/2015  . Visit for screening mammogram 03/08/2015  . Trigeminal neuralgia of right side of face 11/23/2014  . Chronic back pain 11/26/2013  . Hyperlipidemia with target LDL less than 130 09/01/2010  . Severe obesity (BMI >= 40) ([place]) 09/01/2010  . COPD (chronic obstructive pulmonary disease) ([place]) 09/01/2010  . GERD 09/01/2010  . OSTEOARTHRITIS 09/01/2010  . CATARACT, CONGENITAL, LEFT 09/01/2010    Past Surgical History:  Procedure Laterality Date  . MULTIPLE TOOTH EXTRACTIONS     "they took out part of my teeth; I took out the rest when they got loose; was suppose to get dentures; never did"    OB History    No data available       Home Medications    Prior to Admission medications   Medication Sig Start Date End Date Taking? Authorizing Provider  ANORO ELLIPTA 62.5-25 MCG/INH AEPB INHALE 1 PUFF INTO THE LUNGS DAILY. 03/22/16  Yes Janith Lima, MD  furosemide (LASIX) 40 MG tablet Take 1 tablet (40 mg total) by mouth daily. 07/31/16  Yes Janith Lima, MD  gabapentin (NEURONTIN) 400 MG capsule Take 400-1,200 mg by mouth 3 (three) times daily. TAKE 1 CAPSULE AT LUNCH, TAKE 2 CAPSULES AT SUPPER AND 3 CAPSULES AT BEDTIME   Yes [provider]  HYDROcodone-acetaminophen (NORCO) 10-325 MG tablet Take 1 tablet by mouth  every 6 (six) hours as needed. Patient taking differently: Take 1 tablet by mouth every 6 (six) hours as needed for moderate pain.  09/15/15  Yes Janith Lima, MD  tiZANidine (ZANAFLEX) 4 MG tablet 1 TABLET TWICE A DAY AS NEEDED FOR MUSCLE RELAXER 02/21/16  Yes [provider]    Family History Family History  Problem Relation Age of Onset  . Heart attack Father   . Diabetes Mother   . Dementia Mother   . Diabetes Brother   . Heart disease Unknown   . Diabetes Unknown     Social History Social History  Substance Use Topics  .  Smoking status: Former Smoker    Types: Cigarettes    Quit date: 08/22/2010  . Smokeless tobacco: Never Used  . Alcohol use No     Allergies   Baclofen and Naproxen   Review of Systems Review of Systems  Constitutional: Negative for chills.  Respiratory: Positive for cough, shortness of breath and wheezing.   Cardiovascular: Positive for leg swelling. Negative for chest pain and palpitations.  Gastrointestinal: Negative for constipation, diarrhea, nausea and vomiting.  Genitourinary: Negative for dysuria, flank pain and frequency.  Musculoskeletal: Positive for back pain. Negative for neck pain.  Neurological: Negative for dizziness, weakness, light-headedness, numbness and headaches.  All other systems reviewed and are negative.    Physical Exam Updated Vital Signs BP 128/75   Pulse 72   Temp 98.8 F (37.1 C) (Oral)   Resp 16   Ht 5\' 5"  (1.651 m)   Wt (!) 144.2 kg (318 lb)   SpO2 92%   BMI 52.92 kg/m   Physical Exam  Constitutional: She is oriented to person, place, and time. She appears well-developed and well-nourished. No distress.  Obese  HENT:  Head: Normocephalic and atraumatic.  Mouth/Throat: Oropharynx is clear and moist. No oropharyngeal exudate.  Eyes: EOM are normal. Pupils are equal, round, and reactive to light.  Neck: Normal range of motion. Neck supple.  Cardiovascular: Normal rate and regular rhythm.  Exam reveals no gallop and no friction rub.   No murmur heard. Pulmonary/Chest: Effort normal.  Diminished breath sounds in bilateral bases.  Abdominal: Soft. Bowel sounds are normal. There is no tenderness. There is no rebound and no guarding.  Musculoskeletal: Normal range of motion. She exhibits no edema or tenderness.  3+ bilateral lower extremity edema. No asymmetry  Neurological: She is alert and oriented to person, place, and time.  Moving all extremity without deficit. Sensation intact.  Skin: Skin is warm and dry. Capillary refill takes  less than 2 seconds. No rash noted. No erythema.  Psychiatric: She has a normal mood and affect. Her behavior is normal.  Nursing note and vitals reviewed.    ED Treatments / Results  Labs (all labs ordered are listed, but only abnormal results are displayed) Labs Reviewed  COMPREHENSIVE METABOLIC PANEL - Abnormal; Notable for the following:       Result Value   Sodium 134 (*)    Glucose, Bld 110 (*)    Creatinine, Ser 1.08 (*)    GFR calc non Af Amer 52 (*)    GFR calc Af Amer 60 (*)    All other components within normal limits  CBC WITH DIFFERENTIAL/PLATELET  BRAIN NATRIURETIC PEPTIDE  URINALYSIS, ROUTINE W REFLEX MICROSCOPIC  I-STAT TROPOININ, ED    EKG  EKG Interpretation  Date/Time:  Wednesday Jan 16 2017 17:51:12 EDT Ventricular Rate:  67 PR Interval:    QRS  Duration: 104 QT Interval:  398 QTC Calculation: 421 R Axis:   -33 Text Interpretation:  Sinus rhythm Ventricular premature complex Borderline prolonged PR interval Probable left atrial enlargement Left axis deviation When compared with ECG of 11/30/2016, Premature ventricular complexes are now present Nonspecific T wave abnormality has improved Confirmed by Delora Fuel ([id number]) on 01/16/2017 6:13:31 PM       Radiology Ct Angio Chest Pe W Or Wo Contrast  Result Date: 01/16/2017 CLINICAL DATA:  Acute onset of shortness of breath. Decreased O2 saturation. Dizziness and bilateral lower extremity swelling. Initial encounter. EXAM: CT ANGIOGRAPHY CHEST WITH CONTRAST TECHNIQUE: Multidetector CT imaging of the chest was performed using the standard protocol during bolus administration of intravenous contrast. Multiplanar CT image reconstructions and MIPs were obtained to evaluate the vascular anatomy. CONTRAST:  70 mL of Isovue 370 IV contrast COMPARISON:  Chest radiograph performed earlier today at 5:34 p.m., and CTA of the chest performed 10/19/2014. CT of the abdomen and pelvis performed 12/02/2016 FINDINGS:  Cardiovascular: Evaluation for pulmonary embolus is suboptimal due to limitations in the timing of the contrast bolus. Vague filling defects are suggested within segmental and subsegmental pulmonary arteries to the lower lobes bilaterally. Pulmonary emboli cannot be excluded. The heart is mildly enlarged. Mild calcification is noted along the aortic arch. The great vessels are grossly unremarkable in appearance. Mediastinum/Nodes: The mediastinum is otherwise unremarkable. No mediastinal lymphadenopathy is seen. No pericardial effusion is identified. The visualized portions of the thyroid gland are unremarkable. No axillary lymphadenopathy is seen. Lungs/Pleura: Mild bilateral atelectasis is noted. No pleural effusion or pneumothorax is seen. No masses are identified. Upper Abdomen: The visualized portions of the liver and spleen are grossly unremarkable. The gallbladder is somewhat distended. The visualized portions of the pancreas and adrenal glands are within normal limits. There is mild to moderate chronic left-sided hydronephrosis, likely reflecting the previously noted staghorn calculus at the left kidney. Surrounding perinephric stranding is noted. 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 The visualized portions of the right kidney are unremarkable. Musculoskeletal: No acute osseous abnormalities are identified. The visualized musculature is unremarkable in appearance. Review of the MIP images confirms the above findings. IMPRESSION: 1. Evaluation for pulmonary embolus is suboptimal due to limitations in the timing of the contrast bolus. Vague filling defects suggested in segmental and subsegmental pulmonary arteries to the lower lobes bilaterally. Pulmonary emboli cannot be excluded, though this may be artifactual in nature. 2. Mild bilateral atelectasis noted.  Lungs otherwise clear. 3. Mild cardiomegaly. 4. Mild-to-moderate left-sided chronic hydronephrosis, likely reflecting the  previously noted staghorn calculus at the left kidney. Surrounding perinephric stranding noted. 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 These results were called by telephone at the time of interpretation on 01/16/2017 at 9:28 pm to Dr. Julianne Rice, who verbally acknowledged these results. Electronically Signed   By: Garald Balding M.D.   On: 01/16/2017 21:30   Dg Chest Port 1 View  Result Date: 01/16/2017 CLINICAL DATA:  Left-sided chest pain EXAM: PORTABLE CHEST 1 VIEW COMPARISON:  11/30/2016, 08/24/2015 FINDINGS: Cardiomegaly with enlarged central pulmonary arteries and mild vascular congestion as before. Stable coarse interstitial opacities at the basic felt chronic. No consolidation or effusion. Aortic atherosclerosis. No pneumothorax. Advanced arthritis of the left shoulder. IMPRESSION: Cardiomegaly with mild central congestion.  No edema or infiltrate. Electronically Signed   By: Donavan Foil M.D.   On: 01/16/2017 17:48    Procedures Procedures (including critical care time)  Medications Ordered in ED Medications  methylPREDNISolone sodium succinate (SOLU-MEDROL) 125 mg/2 mL injection 125 mg (125 mg Intravenous Given 01/16/17 1823)  albuterol (PROVENTIL) (2.5 MG/3ML) 0.083% nebulizer solution 5 mg (5 mg Nebulization Given 01/16/17 1822)  iopamidol (ISOVUE-370) 76 % injection (100 mLs  Contrast Given 01/16/17 2055)     Initial Impression / Assessment and Plan / ED Course  I have reviewed the triage vital signs and the nursing notes.  Pertinent labs & imaging results that were available during my care of the patient were reviewed by me and considered in my medical decision making (see chart for details).     Questionable segmental and subsegmental PEs bilaterally though poor timing of IV fluid bolus. Discussed with hospitalist and will admit.  Final Clinical Impressions(s) / ED Diagnoses   Final diagnoses:  SOB (shortness of breath)  Hypoxia    New  Prescriptions New Prescriptions   No medications on file     Julianne Rice, MD 01/16/17 2222

## 2017-01-16 NOTE — ED Triage Notes (Signed)
Patient presents from a extended stay hotel where she lives via [place] with shortness of breath that began this morning. Patient went to pain clinic where she was refused medication because of her oxygen saturation of 81%. Patient was 84% on ems arrival. EMS placed patient on 2 L which increased her saturation to 94%. Patient c/o dizziness, and swollen lower extremities  Patient reports chf history and not taking her "fluid pills" in a week. Patient saturation is 86% on room air upon arrival to ED, patient placed back on 2 L with a saturation of 94%

## 2017-01-16 NOTE — Progress Notes (Signed)
ANTICOAGULATION CONSULT NOTE - Initial Consult  Pharmacy Consult for heparin Indication: pulmonary embolus  Allergies  Allergen Reactions  . Baclofen Other (See Comments)    Causes seizures  . Naproxen     Palpitations     Patient Measurements: Height: 5\' 5"  (165.1 cm) Weight: (!) 318 lb (144.2 kg) IBW/kg (Calculated) : 57 Heparin Dosing Weight: 93kg  Vital Signs: Temp: 98.8 F (37.1 C) (05/23 1712) Temp Source: Oral (05/23 1712) BP: 128/75 (05/23 1815) Pulse Rate: 72 (05/23 1815)  Labs:  Recent Labs  01/16/17 1823  HGB 13.9  HCT 43.8  PLT 176  CREATININE 1.08*    Estimated Creatinine Clearance: 72.3 mL/min (A) (by C-G formula based on SCr of 1.08 mg/dL (H)).   Medical History: Past Medical History:  Diagnosis Date  . Acute diastolic CHF (congestive heart failure) ([place]) 08/24/2015  . Cervicalgia   . Chronic back pain   . COPD (chronic obstructive pulmonary disease) ([place])   . Essential hypertension 08/24/2015  . GERD (gastroesophageal reflux disease)   . Headache(784.0)   . Heart murmur   . Hyperlipidemia   . Migraine without aura, with intractable migraine, so stated, without mention of status migrainosus   . OSA on CPAP   . Osteoarthritis   . PVD (peripheral vascular disease) ([place])   . PVD (peripheral vascular disease) ([place])   . Seizures ([place]) 07/2014, 10/2014  . Shortness of breath dyspnea   . Trigeminal neuralgia     Medications:  Infusions:  . heparin      Assessment: 82 yof presented to the ED with SOB. CT completed and reported with possible PE but scan was of poor quality. To start IV heparin. Baseline CBC is WNL and she is not on anticoagulation PTA.   Goal of Therapy:  Heparin level 0.3-0.7 units/ml Monitor platelets by anticoagulation protocol: Yes   Plan:  Heparin bolus 5500 units IV x 1 Heparin gtt 1500 units/hr Check a 6 hr heparin level Daily heparin level and CBC  Deondrae Mcgrail, Rande Lawman 01/16/2017,10:31 PM

## 2017-01-16 NOTE — H&P (Signed)
History and Physical    JONETTA DAGLEY PRF:[id number] DOB: Feb 29, 1946 DOA: 01/16/2017  PCP: Janith Lima, MD  Patient coming from: Home.  Chief Complaint: Shortness of breath.  HPI: Janice Morrow is a 71 y.o. female with history of diastolic CHF, COPD, sleep apnea, hypertension and chronic back pain was referred from the pain management center because patient was found to be short of breath and hypoxic. Patient states she gets epidural shots every 6 months and was due for one today. Patient states over the last few days patient has been progressively short of breath but became acutely worse today. Denies any chest pain or productive cough fever or chills. Becomes more short of breath on exertion and lying flat. Admits to have not taken her Lasix last 1 week as she ran out of it. Patient also has not taken her Neurontin due to the same reason.   ED Course: In the ER patient was found to be hypoxic and short of breath. There was concern for pulmonary embolism given the patient's right lower extremity swelling and pain for last few months. CT angiogram of the chest was done which was suboptimal and was showing possible filling defects in the segmental and subsegmental arteries. For now patient was started on heparin and Dopplers will be ordered in a.m. Patient also given Lasix 40 mg IV 1 dose.  Review of Systems: As per HPI, rest all negative.   Past Medical History:  Diagnosis Date  . Acute diastolic CHF (congestive heart failure) ([place]) 08/24/2015  . Cervicalgia   . Chronic back pain   . COPD (chronic obstructive pulmonary disease) ([place])   . Essential hypertension 08/24/2015  . GERD (gastroesophageal reflux disease)   . Headache(784.0)   . Heart murmur   . Hyperlipidemia   . Migraine without aura, with intractable migraine, so stated, without mention of status migrainosus   . OSA on CPAP   . Osteoarthritis   . PVD (peripheral vascular disease) ([place])   . PVD (peripheral vascular  disease) ([place])   . Seizures ([place]) 07/2014, 10/2014  . Shortness of breath dyspnea   . Trigeminal neuralgia     Past Surgical History:  Procedure Laterality Date  . MULTIPLE TOOTH EXTRACTIONS     "they took out part of my teeth; I took out the rest when they got loose; was suppose to get dentures; never did"     reports that she quit smoking about 6 years ago. Her smoking use included Cigarettes. She has never used smokeless tobacco. She reports that she does not drink alcohol or use drugs.  Allergies  Allergen Reactions  . Baclofen Other (See Comments)    Causes seizures  . Naproxen     Palpitations     Family History  Problem Relation Age of Onset  . Heart attack Father   . Diabetes Mother   . Dementia Mother   . Diabetes Brother   . Heart disease Unknown   . Diabetes Unknown     Prior to Admission medications   Medication Sig Start Date End Date Taking? Authorizing Provider  ANORO ELLIPTA 62.5-25 MCG/INH AEPB INHALE 1 PUFF INTO THE LUNGS DAILY. 03/22/16  Yes Janith Lima, MD  furosemide (LASIX) 40 MG tablet Take 1 tablet (40 mg total) by mouth daily. 07/31/16  Yes Janith Lima, MD  gabapentin (NEURONTIN) 400 MG capsule Take 400-1,200 mg by mouth 3 (three) times daily. TAKE 1 CAPSULE AT LUNCH, TAKE 2 CAPSULES AT SUPPER AND  3 CAPSULES AT BEDTIME   Yes [provider]  HYDROcodone-acetaminophen (NORCO) 10-325 MG tablet Take 1 tablet by mouth every 6 (six) hours as needed. Patient taking differently: Take 1 tablet by mouth every 6 (six) hours as needed for moderate pain.  09/15/15  Yes Janith Lima, MD  tiZANidine (ZANAFLEX) 4 MG tablet 1 TABLET TWICE A DAY AS NEEDED FOR MUSCLE RELAXER 02/21/16  Yes [provider]    Physical Exam: Vitals:   01/16/17 1843 01/16/17 2236 01/16/17 2237 01/16/17 2310  BP:  (!) 154/116  (!) 181/121  Pulse:  74 71 75  Resp:   13 (!) 22  Temp:    97.9 F (36.6 C)  TempSrc:    Oral  SpO2: 92% 95% 92% 93%  Weight:     (!) 148.6 kg (327 lb 9.6 oz)  Height:    5\' 5"  (1.651 m)      Constitutional: Obese not in distress. Vitals:   01/16/17 1843 01/16/17 2236 01/16/17 2237 01/16/17 2310  BP:  (!) 154/116  (!) 181/121  Pulse:  74 71 75  Resp:   13 (!) 22  Temp:    97.9 F (36.6 C)  TempSrc:    Oral  SpO2: 92% 95% 92% 93%  Weight:    (!) 148.6 kg (327 lb 9.6 oz)  Height:    5\' 5"  (1.651 m)   Eyes: Anicteric no pallor. ENMT: No discharge from the ears eyes nose or mouth. Neck: JVD elevated no mass felt. Respiratory: No rhonchi or crepitations. Cardiovascular: S1 and S2 heard no murmurs appreciated. Abdomen: Soft nontender bowel sounds present. Musculoskeletal: Mild edema of the right lower extremity. Skin: No rash. Skin appears warm. Neurologic: Alert awake oriented to time place and person. Moves all extremities. Psychiatric: Appears normal. Normal affect.   Labs on Admission: I have personally reviewed following labs and imaging studies  CBC:  Recent Labs Lab 01/16/17 1823  WBC 9.1  NEUTROABS 5.8  HGB 13.9  HCT 43.8  MCV 90.9  PLT 010   Basic Metabolic Panel:  Recent Labs Lab 01/16/17 1823  NA 134*  K 3.7  CL 102  CO2 22  GLUCOSE 110*  BUN 15  CREATININE 1.08*  CALCIUM 8.9   GFR: Estimated Creatinine Clearance: 73.7 mL/min (A) (by C-G formula based on SCr of 1.08 mg/dL (H)). Liver Function Tests:  Recent Labs Lab 01/16/17 1823  AST 24  ALT 32  ALKPHOS 51  BILITOT 0.9  PROT 6.9  ALBUMIN 3.5   No results for input(s): LIPASE, AMYLASE in the last 168 hours. No results for input(s): AMMONIA in the last 168 hours. Coagulation Profile: No results for input(s): INR, PROTIME in the last 168 hours. Cardiac Enzymes: No results for input(s): CKTOTAL, CKMB, CKMBINDEX, TROPONINI in the last 168 hours. BNP (last 3 results)  Recent Labs  03/14/16 1522  PROBNP 35.0   HbA1C: No results for input(s): HGBA1C in the last 72 hours. CBG: No results for input(s): GLUCAP  in the last 168 hours. Lipid Profile: No results for input(s): CHOL, HDL, LDLCALC, TRIG, CHOLHDL, LDLDIRECT in the last 72 hours. Thyroid Function Tests: No results for input(s): TSH, T4TOTAL, FREET4, T3FREE, THYROIDAB in the last 72 hours. Anemia Panel: No results for input(s): VITAMINB12, FOLATE, FERRITIN, TIBC, IRON, RETICCTPCT in the last 72 hours. Urine analysis:    Component Value Date/Time   COLORURINE YELLOW 08/24/2015 1016   APPEARANCEUR CLEAR 08/24/2015 1016   LABSPEC 1.010 08/24/2015 [place]  7.0 08/24/2015 1016   GLUCOSEU NEGATIVE 08/24/2015 1016   HGBUR SMALL (A) 08/24/2015 1016   BILIRUBINUR NEGATIVE 08/24/2015 1016   KETONESUR NEGATIVE 08/24/2015 1016   PROTEINUR NEGATIVE 08/24/2015 1016   UROBILINOGEN 1.0 11/20/2014 1316   NITRITE NEGATIVE 08/24/2015 1016   LEUKOCYTESUR SMALL (A) 08/24/2015 1016   Sepsis Labs: @LABRCNTIP (procalcitonin:4,lacticidven:4) )No results found for this or any previous visit (from the past 240 hour(s)).   Radiological Exams on Admission: Ct Angio Chest Pe W Or Wo Contrast  Result Date: 01/16/2017 CLINICAL DATA:  Acute onset of shortness of breath. Decreased O2 saturation. Dizziness and bilateral lower extremity swelling. Initial encounter. EXAM: CT ANGIOGRAPHY CHEST WITH CONTRAST TECHNIQUE: Multidetector CT imaging of the chest was performed using the standard protocol during bolus administration of intravenous contrast. Multiplanar CT image reconstructions and MIPs were obtained to evaluate the vascular anatomy. CONTRAST:  70 mL of Isovue 370 IV contrast COMPARISON:  Chest radiograph performed earlier today at 5:34 p.m., and CTA of the chest performed 10/19/2014. CT of the abdomen and pelvis performed 12/02/2016 FINDINGS: Cardiovascular: Evaluation for pulmonary embolus is suboptimal due to limitations in the timing of the contrast bolus. Vague filling defects are suggested within segmental and subsegmental pulmonary arteries to the lower  lobes bilaterally. Pulmonary emboli cannot be excluded. The heart is mildly enlarged. Mild calcification is noted along the aortic arch. The great vessels are grossly unremarkable in appearance. Mediastinum/Nodes: The mediastinum is otherwise unremarkable. No mediastinal lymphadenopathy is seen. No pericardial effusion is identified. The visualized portions of the thyroid gland are unremarkable. No axillary lymphadenopathy is seen. Lungs/Pleura: Mild bilateral atelectasis is noted. No pleural effusion or pneumothorax is seen. No masses are identified. Upper Abdomen: The visualized portions of the liver and spleen are grossly unremarkable. The gallbladder is somewhat distended. The visualized portions of the pancreas and adrenal glands are within normal limits. There is mild to moderate chronic left-sided hydronephrosis, likely reflecting the previously noted staghorn calculus at the left kidney. Surrounding perinephric stranding is noted. 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 The visualized portions of the right kidney are unremarkable. Musculoskeletal: No acute osseous abnormalities are identified. The visualized musculature is unremarkable in appearance. Review of the MIP images confirms the above findings. IMPRESSION: 1. Evaluation for pulmonary embolus is suboptimal due to limitations in the timing of the contrast bolus. Vague filling defects suggested in segmental and subsegmental pulmonary arteries to the lower lobes bilaterally. Pulmonary emboli cannot be excluded, though this may be artifactual in nature. 2. Mild bilateral atelectasis noted.  Lungs otherwise clear. 3. Mild cardiomegaly. 4. Mild-to-moderate left-sided chronic hydronephrosis, likely reflecting the previously noted staghorn calculus at the left kidney. Surrounding perinephric stranding noted. 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 These results were called by telephone at the time of interpretation on  01/16/2017 at 9:28 pm to Dr. Julianne Rice, who verbally acknowledged these results. Electronically Signed   By: Garald Balding M.D.   On: 01/16/2017 21:30   Dg Chest Port 1 View  Result Date: 01/16/2017 CLINICAL DATA:  Left-sided chest pain EXAM: PORTABLE CHEST 1 VIEW COMPARISON:  11/30/2016, 08/24/2015 FINDINGS: Cardiomegaly with enlarged central pulmonary arteries and mild vascular congestion as before. Stable coarse interstitial opacities at the basic felt chronic. No consolidation or effusion. Aortic atherosclerosis. No pneumothorax. Advanced arthritis of the left shoulder. IMPRESSION: Cardiomegaly with mild central congestion.  No edema or infiltrate. Electronically Signed   By: Donavan Foil M.D.   On: 01/16/2017 17:48    EKG: Independently  reviewed. Normal sinus rhythm.  Assessment/Plan Principal Problem:   Acute respiratory failure with hypoxia (HCC) Active Problems:   COPD (chronic obstructive pulmonary disease) (HCC)   OSA on CPAP   Essential hypertension   Seizures (HCC)   Acute on chronic diastolic CHF (congestive heart failure) ([place])   Kidney stone on left side    1. Acute respiratory failure with hypoxia - could be multifactorial. I have ordered Lasix 40 mg IV every 12. In addition patient's CAT scan shows possible filling defects but study was suboptimal. For now we have placed patient on IV heparin infusion for possible pulmonary embolism. Check Dopplers in a.m. to rule out DVT. Last 2-D echo done in April 2018 showing EF of 55-60% with grade 1 diastolic dysfunction. 2. COPD - not actively wheezing. Continue home inhalers. 3. Sleep apnea on CPAP at bedtime. 4. Chronic kidney disease stage III - creatinine appears to be at baseline. 5. Chronic left-sided hydronephrosis with possible staghorn calculus - will check UA. Patient will need follow-up with urologist as outpatient. 6. Chronic low back pain - continue home medications including hydrocodone and Neurontin. Neurontin  has been restarted patient was off it for last 1 week.   DVT prophylaxis: Heparin. Code Status: Full code.  Family Communication: Discussed with patient.  Disposition Plan: Home.  Consults called: None.  Admission status: Inpatient.    Rise Patience MD [facility] Pager [phone].  If 7PM-7AM, please contact night-coverage [email or web address] Password [email or web address]  01/16/2017, 11:20 PM

## 2017-01-17 ENCOUNTER — Encounter (HOSPITAL_COMMUNITY): Payer: Medicare Other

## 2017-01-17 DIAGNOSIS — I5033 Acute on chronic diastolic (congestive) heart failure: Secondary | ICD-10-CM

## 2017-01-17 DIAGNOSIS — J9601 Acute respiratory failure with hypoxia: Secondary | ICD-10-CM

## 2017-01-17 DIAGNOSIS — R0902 Hypoxemia: Secondary | ICD-10-CM

## 2017-01-17 DIAGNOSIS — N200 Calculus of kidney: Secondary | ICD-10-CM

## 2017-01-17 DIAGNOSIS — Z9989 Dependence on other enabling machines and devices: Secondary | ICD-10-CM

## 2017-01-17 DIAGNOSIS — I10 Essential (primary) hypertension: Secondary | ICD-10-CM

## 2017-01-17 DIAGNOSIS — G4733 Obstructive sleep apnea (adult) (pediatric): Secondary | ICD-10-CM

## 2017-01-17 DIAGNOSIS — J449 Chronic obstructive pulmonary disease, unspecified: Secondary | ICD-10-CM

## 2017-01-17 LAB — BASIC METABOLIC PANEL
ANION GAP: 9 (ref 5–15)
BUN: 14 mg/dL (ref 6–20)
CHLORIDE: 102 mmol/L (ref 101–111)
CO2: 24 mmol/L (ref 22–32)
Calcium: 9.4 mg/dL (ref 8.9–10.3)
Creatinine, Ser: 1.13 mg/dL — ABNORMAL HIGH (ref 0.44–1.00)
GFR calc non Af Amer: 49 mL/min — ABNORMAL LOW (ref >60)
GFR, EST AFRICAN AMERICAN: 57 mL/min — AB (ref >60)
Glucose, Bld: 190 mg/dL — ABNORMAL HIGH (ref 65–99)
Potassium: 4.2 mmol/L (ref 3.5–5.1)
Sodium: 135 mmol/L (ref 135–145)

## 2017-01-17 LAB — CBC
HEMATOCRIT: 44.9 % (ref 36.0–46.0)
Hemoglobin: 14.2 g/dL (ref 12.0–15.0)
MCH: 28.7 pg (ref 26.0–34.0)
MCHC: 31.6 g/dL (ref 30.0–36.0)
MCV: 90.7 fL (ref 78.0–100.0)
Platelets: 195 10*3/uL (ref 150–400)
RBC: 4.95 MIL/uL (ref 3.87–5.11)
RDW: 14.8 % (ref 11.5–15.5)
WBC: 6.5 10*3/uL (ref 4.0–10.5)

## 2017-01-17 LAB — TROPONIN I: Troponin I: <0.03 ng/mL (ref <0.03)

## 2017-01-17 LAB — URINALYSIS, ROUTINE W REFLEX MICROSCOPIC
BILIRUBIN URINE: NEGATIVE
GLUCOSE, UA: 50 mg/dL — AB
Ketones, ur: NEGATIVE mg/dL
NITRITE: NEGATIVE
PROTEIN: NEGATIVE mg/dL
Specific Gravity, Urine: 1.01 (ref 1.005–1.030)
pH: 6 (ref 5.0–8.0)

## 2017-01-17 LAB — HEPARIN LEVEL (UNFRACTIONATED): HEPARIN UNFRACTIONATED: 0.32 [IU]/mL (ref 0.30–0.70)

## 2017-01-17 MED ORDER — APIXABAN 5 MG PO TABS
10.0000 mg | ORAL_TABLET | Freq: Two times a day (BID) | ORAL | Status: DC
  Administered 2017-01-17 – 2017-01-19 (×5): 10 mg via ORAL
  Filled 2017-01-17 (×5): qty 2

## 2017-01-17 MED ORDER — FUROSEMIDE 10 MG/ML IJ SOLN
40.0000 mg | Freq: Two times a day (BID) | INTRAMUSCULAR | Status: DC
  Administered 2017-01-17 – 2017-01-19 (×5): 40 mg via INTRAVENOUS
  Filled 2017-01-17 (×5): qty 4

## 2017-01-17 MED ORDER — FUROSEMIDE 10 MG/ML IJ SOLN: 40.0000 mg | Freq: Two times a day (BID) | INTRAMUSCULAR | Status: DC

## 2017-01-17 MED ORDER — APIXABAN 5 MG PO TABS: 5.0000 mg | ORAL_TABLET | Freq: Two times a day (BID) | ORAL | Status: DC

## 2017-01-17 NOTE — Progress Notes (Signed)
Nutrition Brief Note  Patient identified on the Malnutrition Screening Tool (MST) Report  Wt Readings from Last 15 Encounters:  01/16/17 (!) 327 lb 9.6 oz (148.6 kg)  12/04/16 (!) 325 lb 6.4 oz (147.6 kg)  07/31/16 (!) 315 lb (142.9 kg)  03/14/16 (!) 317 lb (143.8 kg)  11/29/15 (!) 315 lb (142.9 kg)  11/22/15 (!) 315 lb (142.9 kg)  09/15/15 (!) 329 lb (149.2 kg)  08/28/15 (!) 326 lb 11.6 oz (148.2 kg)  07/12/15 (!) 337 lb (152.9 kg)  06/16/15 (!) 337 lb (152.9 kg)  05/18/15 (!) 337 lb 11.2 oz (153.2 kg)  04/14/15 (!) 330 lb (149.7 kg)  04/11/15 (!) 330 lb (149.7 kg)  03/08/15 (!) 338 lb (153.3 kg)  01/13/15 (!) 331 lb (150.1 kg)   Janice Morrow is a 71 y.o. female with history of diastolic CHF, COPD, sleep apnea, hypertension and chronic back pain was referred from the pain management center because patient was found to be short of breath and hypoxic. Patient states she gets epidural shots every 6 months and was due for one today. Patient states over the last few days patient has been progressively short of breath but became acutely worse today. Denies any chest pain or productive cough fever or chills. Becomes more short of breath on exertion and lying flat. Admits to have not taken her Lasix last 1 week as she ran out of it. Patient also has not taken her Neurontin due to the same reason.   Pt admitted with acute respiratory failure with hypoxia.   Pt sleeping soundly on Bi-Pap at time of visit.   Visualization of pt revealed no signs of fat or muscle depletion.   Reviewed wt hx; no significant wt changes over the past year. Suspect wt changes related to CHF and diuresis.   Body mass index is 54.52 kg/m. Patient meets criteria for extreme obesity, class III based on current BMI.   Current diet order is Heart Healthy, patient is consuming approximately 100% of meals at this time. Labs and medications reviewed.   No nutrition interventions warranted at this time. If nutrition  issues arise, please consult RD.   Kaz Auld A. Jimmye Norman, RD, LDN, CDE Pager: [phone] After hours Pager: [phone]

## 2017-01-17 NOTE — Progress Notes (Signed)
01/17/2017- Respiratory care note- CPAP at bedside for pt use- Pt declining CPAP at this time.

## 2017-01-17 NOTE — Progress Notes (Signed)
ANTICOAGULATION CONSULT NOTE - Follow Up Consult  Pharmacy Consult for heparin Indication: pulmonary embolus  Labs:  Recent Labs  01/16/17 1823 01/17/17 0022 01/17/17 0452  HGB 13.9  --  14.2  HCT 43.8  --  44.9  PLT 176  --  195  HEPARINUNFRC  --   --  0.32  CREATININE 1.08*  --   --   TROPONINI  --  <0.03  --     Assessment: 71yo female therapeutic on heparin with initial dosing for PE though at low end of goal, would prefer higher level w/ VTE.  Goal of Therapy:  Heparin level 0.3-0.7 units/ml   Plan:  Will increase heparin gtt by 1-2 units/kg/hr to 1700 units/hr and check level in 6hr.  Wynona Neat, PharmD, BCPS  01/17/2017,5:59 AM

## 2017-01-17 NOTE — Progress Notes (Signed)
Pharmacist stated to stop heparin and give eliquis dose now. Informed that heparin has been stopped and will pull eliquis when profiled  Janice Morrow

## 2017-01-17 NOTE — Discharge Instructions (Signed)
Information on my medicine - ELIQUIS (apixaban)  This medication education was reviewed with me or my healthcare representative as part of my discharge preparation.  The pharmacist that spoke with me during my hospital stay was:  Dareen Piano, [facility]  Why was Eliquis prescribed for you? Eliquis was prescribed to treat blood clots that may have been found in the veins of your legs (deep vein thrombosis) or in your lungs (pulmonary embolism) and to reduce the risk of them occurring again.  What do You need to know about Eliquis ? The starting dose is 10 mg (two 5 mg tablets) taken TWICE daily for the FIRST SEVEN (7) DAYS, then on 01/24/17 the dose is reduced to ONE 5 mg tablet taken TWICE daily.  Eliquis may be taken with or without food.   Try to take the dose about the same time in the morning and in the evening. If you have difficulty swallowing the tablet whole please discuss with your pharmacist how to take the medication safely.  Take Eliquis exactly as prescribed and DO NOT stop taking Eliquis without talking to the doctor who prescribed the medication.  Stopping may increase your risk of developing a new blood clot.  Refill your prescription before you run out.  After discharge, you should have regular check-up appointments with your healthcare provider that is prescribing your Eliquis.    What do you do if you miss a dose? If a dose of ELIQUIS is not taken at the scheduled time, take it as soon as possible on the same day and twice-daily administration should be resumed. The dose should not be doubled to make up for a missed dose.  Important Safety Information A possible side effect of Eliquis is bleeding. You should call your healthcare provider right away if you experience any of the following: ? Bleeding from an injury or your nose that does not stop. ? Unusual colored urine (red or dark brown) or unusual colored stools (red or black). ? Unusual bruising for unknown  reasons. ? A serious fall or if you hit your head (even if there is no bleeding).  Some medicines may interact with Eliquis and might increase your risk of bleeding or clotting while on Eliquis. To help avoid this, consult your healthcare provider or pharmacist prior to using any new prescription or non-prescription medications, including herbals, vitamins, non-steroidal anti-inflammatory drugs (NSAIDs) and supplements.  This website has more information on Eliquis (apixaban): http://www.eliquis.com/eliquis/home

## 2017-01-17 NOTE — Progress Notes (Signed)
PROGRESS NOTE  JEYLIN WOODMANSEE  ZOX:[id number] DOB: July 30, 1946 DOA: 01/16/2017 PCP: Janith Lima, MD Outpatient Specialists:  Subjective: Feeling okay, denies any fever or chills still short of breath.  Brief Narrative:  FREDRICKA KOHRS is a 71 y.o. female with history of diastolic CHF, COPD, sleep apnea, hypertension and chronic back pain was referred from the pain management center because patient was found to be short of breath and hypoxic. Patient states she gets epidural shots every 6 months and was due for one today. Patient states over the last few days patient has been progressively short of breath but became acutely worse today. Denies any chest pain or productive cough fever or chills. Becomes more short of breath on exertion and lying flat. Admits to have not taken her Lasix last 1 week as she ran out of it. Patient also has not taken her Neurontin due to the same reason.   Assessment & Plan:   Principal Problem:   Acute respiratory failure with hypoxia (HCC) Active Problems:   COPD (chronic obstructive pulmonary disease) (HCC)   OSA on CPAP   Essential hypertension   Seizures (HCC)   Acute on chronic diastolic CHF (congestive heart failure) (HCC)   Kidney stone on left side   Acute respiratory failure with hypoxia This is likely multifactorial secondary to acute on chronic diastolic CHF and PE. Started on diuresis with IV Lasix every 12 hours. Started on heparin drip for PE, will switch to oral DOAC.  Acute on chronic diastolic CHF -As mentioned above, started on IV Lasix, 2-D echo done on 12/01/2016 showed LVEF of 55-60% with grade 1 DD. -Continue diuresis, follow intake/output and renal function closely. -Chest other cardiac medications.  Acute PE -Question of PE as patient has hypoxia CT angio done and showed vague filling defect suggest segmental PE. -Started on heparin drip, will switch to Eliquis as creatinine is slightly elevated. -Suggest at least 3 months  and repeat CT angio.  COPD - not actively wheezing. Continue home inhalers.  Sleep apnea on CPAP at bedtime.  Chronic kidney disease stage III - creatinine appears to be at baseline.  Chronic left-sided hydronephrosis with possible staghorn calculus - will check UA. Patient will need follow-up with urologist as outpatient.  Chronic low back pain - continue home medications including hydrocodone and Neurontin. Neurontin has been restarted patient was off it for last 1 week.   DVT prophylaxis:  Code Status: Full Code Family Communication:  Disposition Plan:  Diet: Diet Heart Room service appropriate? Yes; Fluid consistency: Thin; Fluid restriction: 1200 mL Fluid  Consultants:   None  Procedures:   None  Antimicrobials:   None   Objective: Vitals:   01/16/17 2237 01/16/17 2310 01/17/17 0557 01/17/17 0736  BP:  (!) 181/121 (!) 151/72 (!) 159/91  Pulse: 71 75 (!) 56 (!) 53  Resp: 13 (!) 22  20  Temp:  97.9 F (36.6 C) 98.1 F (36.7 C) 97.5 F (36.4 C)  TempSrc:  Oral Axillary Axillary  SpO2: 92% 93% 90% 95%  Weight:  (!) 148.6 kg (327 lb 9.6 oz)    Height:  5\' 5"  (1.651 m)      Intake/Output Summary (Last 24 hours) at 01/17/17 1038 Last data filed at 01/17/17 0926  Gross per 24 hour  Intake           347.25 ml  Output             2100 ml  Net         -  1752.75 ml   Filed Weights   01/16/17 1708 01/16/17 2310  Weight: (!) 144.2 kg (318 lb) (!) 148.6 kg (327 lb 9.6 oz)    Examination: General exam: Appears calm and comfortable  Respiratory system: Clear to auscultation. Respiratory effort normal. Cardiovascular system: S1 & S2 heard, RRR. No JVD, murmurs, rubs, gallops or clicks. No pedal edema. Gastrointestinal system: Abdomen is nondistended, soft and nontender. No organomegaly or masses felt. Normal bowel sounds heard. Central nervous system: Alert and oriented. No focal neurological deficits. Extremities: Symmetric 5 x 5 power. Skin: No rashes, lesions or  ulcers Psychiatry: Judgement and insight appear normal. Mood & affect appropriate.   Data Reviewed: I have personally reviewed following labs and imaging studies  CBC:  Recent Labs Lab 01/16/17 1823 01/17/17 0452  WBC 9.1 6.5  NEUTROABS 5.8  --   HGB 13.9 14.2  HCT 43.8 44.9  MCV 90.9 90.7  PLT 176 710   Basic Metabolic Panel:  Recent Labs Lab 01/16/17 1823 01/17/17 0452  NA 134* 135  K 3.7 4.2  CL 102 102  CO2 22 24  GLUCOSE 110* 190*  BUN 15 14  CREATININE 1.08* 1.13*  CALCIUM 8.9 9.4   GFR: Estimated Creatinine Clearance: 70.4 mL/min (A) (by C-G formula based on SCr of 1.13 mg/dL (H)). Liver Function Tests:  Recent Labs Lab 01/16/17 1823  AST 24  ALT 32  ALKPHOS 51  BILITOT 0.9  PROT 6.9  ALBUMIN 3.5   No results for input(s): LIPASE, AMYLASE in the last 168 hours. No results for input(s): AMMONIA in the last 168 hours. Coagulation Profile: No results for input(s): INR, PROTIME in the last 168 hours. Cardiac Enzymes:  Recent Labs Lab 01/17/17 0022 01/17/17 0452  TROPONINI <0.03 <0.03   BNP (last 3 results)  Recent Labs  03/14/16 1522  PROBNP 35.0   HbA1C: No results for input(s): HGBA1C in the last 72 hours. CBG: No results for input(s): GLUCAP in the last 168 hours. Lipid Profile: No results for input(s): CHOL, HDL, LDLCALC, TRIG, CHOLHDL, LDLDIRECT in the last 72 hours. Thyroid Function Tests: No results for input(s): TSH, T4TOTAL, FREET4, T3FREE, THYROIDAB in the last 72 hours. Anemia Panel: No results for input(s): VITAMINB12, FOLATE, FERRITIN, TIBC, IRON, RETICCTPCT in the last 72 hours. Urine analysis:    Component Value Date/Time   COLORURINE STRAW (A) 01/16/2017 0105   APPEARANCEUR CLEAR 01/16/2017 0105   LABSPEC 1.010 01/16/2017 0105   PHURINE 6.0 01/16/2017 0105   GLUCOSEU 50 (A) 01/16/2017 0105   HGBUR MODERATE (A) 01/16/2017 0105   BILIRUBINUR NEGATIVE 01/16/2017 0105   KETONESUR NEGATIVE 01/16/2017 0105   PROTEINUR  NEGATIVE 01/16/2017 0105   UROBILINOGEN 1.0 11/20/2014 1316   NITRITE NEGATIVE 01/16/2017 0105   LEUKOCYTESUR LARGE (A) 01/16/2017 0105   Sepsis Labs: @LABRCNTIP (procalcitonin:4,lacticidven:4)  )No results found for this or any previous visit (from the past 240 hour(s)).   Invalid input(s): PROCALCITONIN, LACTICACIDVEN   Radiology Studies: Ct Angio Chest Pe W Or Wo Contrast  Result Date: 01/16/2017 CLINICAL DATA:  Acute onset of shortness of breath. Decreased O2 saturation. Dizziness and bilateral lower extremity swelling. Initial encounter. EXAM: CT ANGIOGRAPHY CHEST WITH CONTRAST TECHNIQUE: Multidetector CT imaging of the chest was performed using the standard protocol during bolus administration of intravenous contrast. Multiplanar CT image reconstructions and MIPs were obtained to evaluate the vascular anatomy. CONTRAST:  70 mL of Isovue 370 IV contrast COMPARISON:  Chest radiograph performed earlier today at 5:34 p.m., and CTA of the  chest performed 10/19/2014. CT of the abdomen and pelvis performed 12/02/2016 FINDINGS: Cardiovascular: Evaluation for pulmonary embolus is suboptimal due to limitations in the timing of the contrast bolus. Vague filling defects are suggested within segmental and subsegmental pulmonary arteries to the lower lobes bilaterally. Pulmonary emboli cannot be excluded. The heart is mildly enlarged. Mild calcification is noted along the aortic arch. The great vessels are grossly unremarkable in appearance. Mediastinum/Nodes: The mediastinum is otherwise unremarkable. No mediastinal lymphadenopathy is seen. No pericardial effusion is identified. The visualized portions of the thyroid gland are unremarkable. No axillary lymphadenopathy is seen. Lungs/Pleura: Mild bilateral atelectasis is noted. No pleural effusion or pneumothorax is seen. No masses are identified. Upper Abdomen: The visualized portions of the liver and spleen are grossly unremarkable. The gallbladder is  somewhat distended. The visualized portions of the pancreas and adrenal glands are within normal limits. There is mild to moderate chronic left-sided hydronephrosis, likely reflecting the previously noted staghorn calculus at the left kidney. Surrounding perinephric stranding is noted. 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 The visualized portions of the right kidney are unremarkable. Musculoskeletal: No acute osseous abnormalities are identified. The visualized musculature is unremarkable in appearance. Review of the MIP images confirms the above findings. IMPRESSION: 1. Evaluation for pulmonary embolus is suboptimal due to limitations in the timing of the contrast bolus. Vague filling defects suggested in segmental and subsegmental pulmonary arteries to the lower lobes bilaterally. Pulmonary emboli cannot be excluded, though this may be artifactual in nature. 2. Mild bilateral atelectasis noted.  Lungs otherwise clear. 3. Mild cardiomegaly. 4. Mild-to-moderate left-sided chronic hydronephrosis, likely reflecting the previously noted staghorn calculus at the left kidney. Surrounding perinephric stranding noted. 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 These results were called by telephone at the time of interpretation on 01/16/2017 at 9:28 pm to Dr. Julianne Rice, who verbally acknowledged these results. Electronically Signed   By: Garald Balding M.D.   On: 01/16/2017 21:30   Dg Chest Port 1 View  Result Date: 01/16/2017 CLINICAL DATA:  Left-sided chest pain EXAM: PORTABLE CHEST 1 VIEW COMPARISON:  11/30/2016, 08/24/2015 FINDINGS: Cardiomegaly with enlarged central pulmonary arteries and mild vascular congestion as before. Stable coarse interstitial opacities at the basic felt chronic. No consolidation or effusion. Aortic atherosclerosis. No pneumothorax. Advanced arthritis of the left shoulder. IMPRESSION: Cardiomegaly with mild central congestion.  No edema or  infiltrate. Electronically Signed   By: Donavan Foil M.D.   On: 01/16/2017 17:48        Scheduled Meds: . furosemide  40 mg Intravenous BID  . gabapentin  400 mg Oral Q lunch   And  . gabapentin  800 mg Oral Q supper   And  . gabapentin  1,200 mg Oral QHS  . umeclidinium-vilanterol  1 puff Inhalation Daily   Continuous Infusions: . heparin 1,700 Units/hr (01/17/17 0622)     LOS: 1 day    Time spent: 35 minutes    Navaya Wiatrek A, MD [facility] Pager [phone]  If 7PM-7AM, please contact night-coverage [email or web address] Password [facility] 01/17/2017, 10:38 AM

## 2017-01-18 ENCOUNTER — Encounter (HOSPITAL_COMMUNITY): Payer: Medicare Other

## 2017-01-18 ENCOUNTER — Inpatient Hospital Stay (HOSPITAL_COMMUNITY): Payer: Medicare Other

## 2017-01-18 DIAGNOSIS — R569 Unspecified convulsions: Secondary | ICD-10-CM

## 2017-01-18 DIAGNOSIS — Z86711 Personal history of pulmonary embolism: Secondary | ICD-10-CM

## 2017-01-18 DIAGNOSIS — M79609 Pain in unspecified limb: Secondary | ICD-10-CM

## 2017-01-18 DIAGNOSIS — R0602 Shortness of breath: Secondary | ICD-10-CM

## 2017-01-18 LAB — BASIC METABOLIC PANEL
ANION GAP: 6 (ref 5–15)
BUN: 24 mg/dL — ABNORMAL HIGH (ref 6–20)
CALCIUM: 9.7 mg/dL (ref 8.9–10.3)
CHLORIDE: 102 mmol/L (ref 101–111)
CO2: 31 mmol/L (ref 22–32)
CREATININE: 1.24 mg/dL — AB (ref 0.44–1.00)
GFR calc non Af Amer: 44 mL/min — ABNORMAL LOW (ref >60)
GFR, EST AFRICAN AMERICAN: 51 mL/min — AB (ref >60)
Glucose, Bld: 120 mg/dL — ABNORMAL HIGH (ref 65–99)
Potassium: 4.3 mmol/L (ref 3.5–5.1)
SODIUM: 139 mmol/L (ref 135–145)

## 2017-01-18 MED ORDER — DEXTROSE 5 % IV SOLN
1.0000 g | INTRAVENOUS | Status: DC
  Administered 2017-01-18 – 2017-01-19 (×2): 1 g via INTRAVENOUS
  Filled 2017-01-18 (×2): qty 10

## 2017-01-18 NOTE — Progress Notes (Signed)
Pt refused her CPAP

## 2017-01-18 NOTE — Care Management Note (Addendum)
Case Management Note  Patient Details  Name: Janice Morrow MRN: [id number] Date of Birth: September 19, 1945  Subjective/Objective:                 Spoke to patient at the bedside. Admitted with DVT, initiating Eliquis. Provided with 30 day coupon. Patient verbalized understanding for use. Rx covered with medicaid.  PCP- Janith Lima  Pharmacy [facility] [address]   Action/Plan:  CM will continue  To follow. Watch for home O2 needs. 5/26 DC to home, patient refuses home O2. Expected Discharge Date:                  Expected Discharge Plan:  Home/Self Care  In-House Referral:     Discharge planning Services  CM Consult, Medication Assistance  Post Acute Care Choice:    Choice offered to:     DME Arranged:    DME Agency:     HH Arranged:    HH Agency:     Status of Service:  In process, will continue to follow  If discussed at Long Length of Stay Meetings, dates discussed:    Additional Comments:  Carles Collet, RN 01/18/2017, 1:54 PM

## 2017-01-18 NOTE — Progress Notes (Signed)
Pt slept well during the night, Vitals stable, no any sign of SOB and distress noted,Norco given once at bed time for complain of back pain, Oxygen is continue @2l /min, will continue to monitor the patient.

## 2017-01-18 NOTE — Progress Notes (Signed)
*  PRELIMINARY RESULTS* Vascular Ultrasound Lower extremity venous duplex has been completed.  Preliminary findings: no evidence of DVT in the visualized veins. No evidence of baker's cyst.   Landry Mellow, RDMS, RVT  01/18/2017, 4:34 PM

## 2017-01-18 NOTE — Progress Notes (Addendum)
PROGRESS NOTE  Janice Morrow  RCV:[id number] DOB: 1946-06-09 DOA: 01/16/2017 PCP: Janith Lima, MD Outpatient Specialists:  Subjective: No fever or chills, still feeling short of breath, hypoxic when she gets up. Patient reported that she will not be able to do oxygen at home because of her brother smokes inside the house.  Brief Narrative:  Janice Morrow is a 71 y.o. female with history of diastolic CHF, COPD, sleep apnea, hypertension and chronic back pain was referred from the pain management center because patient was found to be short of breath and hypoxic. Patient states she gets epidural shots every 6 months and was due for one today. Patient states over the last few days patient has been progressively short of breath but became acutely worse today. Denies any chest pain or productive cough fever or chills. Becomes more short of breath on exertion and lying flat. Admits to have not taken her Lasix last 1 week as she ran out of it. Patient also has not taken her Neurontin due to the same reason.   Assessment & Plan:   Principal Problem:   Acute respiratory failure with hypoxia (HCC) Active Problems:   COPD (chronic obstructive pulmonary disease) (HCC)   OSA on CPAP   Essential hypertension   Seizures (HCC)   Acute on chronic diastolic CHF (congestive heart failure) (HCC)   Kidney stone on left side   Acute respiratory failure with hypoxia This is likely multifactorial secondary to acute on chronic diastolic CHF and PE. Started on diuresis with IV Lasix every 12 hours. She was on heparin drip for PE, switched to Eliquis.  Acute on chronic diastolic CHF -As mentioned above, started on IV Lasix, 2-D echo done on 12/01/2016 showed LVEF of 55-60% with grade 1 DD. -Continue diuresis, follow intake/output and renal function closely. -Adjust other cardiac medications.  Acute PE -Question of PE as patient has hypoxia CT angio done and showed vague filling defect suggest  segmental PE. -Started on heparin drip, will switch to Eliquis as creatinine is slightly elevated. -Suggest at least 3 months and repeat CT angio.  COPD - not actively wheezing. Continue home inhalers.  Sleep apnea on CPAP at bedtime.  Chronic kidney disease stage III - creatinine appears to be at baseline.  Chronic left-sided hydronephrosis  with possible staghorn calculus, UA shows a UTI, started on Rocephin, send urine for culture. Needs to follow-up with urology as outpatient ASAP  Chronic low back pain  Continue home medications including hydrocodone and Neurontin.  Neurontin has been restarted patient was off it for last 1 week. Being told she will not be able to have epidural injection while she is on anticoagulation.  Morbid obesity With BMI of 53, likely she has component of obesity hypoventilation syndrome.   DVT prophylaxis:  Code Status: Full Code Family Communication:  Disposition Plan:  Diet: Diet Heart Room service appropriate? Yes; Fluid consistency: Thin; Fluid restriction: 1200 mL Fluid  Consultants:   None  Procedures:   None  Antimicrobials:   None   Objective: Vitals:   01/18/17 0410 01/18/17 0734 01/18/17 0838 01/18/17 1000  BP: 136/72  123/72 123/71  Pulse: 69  60 86  Resp: 18  20   Temp: 98.6 F (37 C)  98.2 F (36.8 C)   TempSrc: Oral  Oral   SpO2: 95% 94% 94% (!) 89%  Weight: (!) 146.8 kg (323 lb 11.2 oz)     Height:        Intake/Output Summary (Last  24 hours) at 01/18/17 1047 Last data filed at 01/18/17 1000  Gross per 24 hour  Intake           535.65 ml  Output             2500 ml  Net         -1964.35 ml   Filed Weights   01/16/17 1708 01/16/17 2310 01/18/17 0410  Weight: (!) 144.2 kg (318 lb) (!) 148.6 kg (327 lb 9.6 oz) (!) 146.8 kg (323 lb 11.2 oz)    Examination: General exam: Appears calm and comfortable  Respiratory system: Clear to auscultation. Respiratory effort normal. Cardiovascular system: S1 & S2 heard,  RRR. No JVD, murmurs, rubs, gallops or clicks. No pedal edema. Gastrointestinal system: Abdomen is nondistended, soft and nontender. No organomegaly or masses felt. Normal bowel sounds heard. Central nervous system: Alert and oriented. No focal neurological deficits. Extremities: Symmetric 5 x 5 power. Skin: No rashes, lesions or ulcers Psychiatry: Judgement and insight appear normal. Mood & affect appropriate.   Data Reviewed: I have personally reviewed following labs and imaging studies  CBC:  Recent Labs Lab 01/16/17 1823 01/17/17 0452  WBC 9.1 6.5  NEUTROABS 5.8  --   HGB 13.9 14.2  HCT 43.8 44.9  MCV 90.9 90.7  PLT 176 629   Basic Metabolic Panel:  Recent Labs Lab 01/16/17 1823 01/17/17 0452 01/18/17 0346  NA 134* 135 139  K 3.7 4.2 4.3  CL 102 102 102  CO2 22 24 31   GLUCOSE 110* 190* 120*  BUN 15 14 24*  CREATININE 1.08* 1.13* 1.24*  CALCIUM 8.9 9.4 9.7   GFR: Estimated Creatinine Clearance: 63.7 mL/min (A) (by C-G formula based on SCr of 1.24 mg/dL (H)). Liver Function Tests:  Recent Labs Lab 01/16/17 1823  AST 24  ALT 32  ALKPHOS 51  BILITOT 0.9  PROT 6.9  ALBUMIN 3.5   No results for input(s): LIPASE, AMYLASE in the last 168 hours. No results for input(s): AMMONIA in the last 168 hours. Coagulation Profile: No results for input(s): INR, PROTIME in the last 168 hours. Cardiac Enzymes:  Recent Labs Lab 01/17/17 0022 01/17/17 0452 01/17/17 1014  TROPONINI <0.03 <0.03 <0.03   BNP (last 3 results)  Recent Labs  03/14/16 1522  PROBNP 35.0   HbA1C: No results for input(s): HGBA1C in the last 72 hours. CBG: No results for input(s): GLUCAP in the last 168 hours. Lipid Profile: No results for input(s): CHOL, HDL, LDLCALC, TRIG, CHOLHDL, LDLDIRECT in the last 72 hours. Thyroid Function Tests: No results for input(s): TSH, T4TOTAL, FREET4, T3FREE, THYROIDAB in the last 72 hours. Anemia Panel: No results for input(s): VITAMINB12, FOLATE,  FERRITIN, TIBC, IRON, RETICCTPCT in the last 72 hours. Urine analysis:    Component Value Date/Time   COLORURINE STRAW (A) 01/16/2017 0105   APPEARANCEUR CLEAR 01/16/2017 0105   LABSPEC 1.010 01/16/2017 0105   PHURINE 6.0 01/16/2017 0105   GLUCOSEU 50 (A) 01/16/2017 0105   HGBUR MODERATE (A) 01/16/2017 0105   BILIRUBINUR NEGATIVE 01/16/2017 0105   KETONESUR NEGATIVE 01/16/2017 0105   PROTEINUR NEGATIVE 01/16/2017 0105   UROBILINOGEN 1.0 11/20/2014 1316   NITRITE NEGATIVE 01/16/2017 0105   LEUKOCYTESUR LARGE (A) 01/16/2017 0105   Sepsis Labs: @LABRCNTIP (procalcitonin:4,lacticidven:4)  )No results found for this or any previous visit (from the past 240 hour(s)).   Invalid input(s): PROCALCITONIN, LACTICACIDVEN   Radiology Studies: Ct Angio Chest Pe W Or Wo Contrast  Result Date: 01/16/2017 CLINICAL DATA:  Acute onset of shortness of breath. Decreased O2 saturation. Dizziness and bilateral lower extremity swelling. Initial encounter. EXAM: CT ANGIOGRAPHY CHEST WITH CONTRAST TECHNIQUE: Multidetector CT imaging of the chest was performed using the standard protocol during bolus administration of intravenous contrast. Multiplanar CT image reconstructions and MIPs were obtained to evaluate the vascular anatomy. CONTRAST:  70 mL of Isovue 370 IV contrast COMPARISON:  Chest radiograph performed earlier today at 5:34 p.m., and CTA of the chest performed 10/19/2014. CT of the abdomen and pelvis performed 12/02/2016 FINDINGS: Cardiovascular: Evaluation for pulmonary embolus is suboptimal due to limitations in the timing of the contrast bolus. Vague filling defects are suggested within segmental and subsegmental pulmonary arteries to the lower lobes bilaterally. Pulmonary emboli cannot be excluded. The heart is mildly enlarged. Mild calcification is noted along the aortic arch. The great vessels are grossly unremarkable in appearance. Mediastinum/Nodes: The mediastinum is otherwise unremarkable. No  mediastinal lymphadenopathy is seen. No pericardial effusion is identified. The visualized portions of the thyroid gland are unremarkable. No axillary lymphadenopathy is seen. Lungs/Pleura: Mild bilateral atelectasis is noted. No pleural effusion or pneumothorax is seen. No masses are identified. Upper Abdomen: The visualized portions of the liver and spleen are grossly unremarkable. The gallbladder is somewhat distended. The visualized portions of the pancreas and adrenal glands are within normal limits. There is mild to moderate chronic left-sided hydronephrosis, likely reflecting the previously noted staghorn calculus at the left kidney. Surrounding perinephric stranding is noted. 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 The visualized portions of the right kidney are unremarkable. Musculoskeletal: No acute osseous abnormalities are identified. The visualized musculature is unremarkable in appearance. Review of the MIP images confirms the above findings. IMPRESSION: 1. Evaluation for pulmonary embolus is suboptimal due to limitations in the timing of the contrast bolus. Vague filling defects suggested in segmental and subsegmental pulmonary arteries to the lower lobes bilaterally. Pulmonary emboli cannot be excluded, though this may be artifactual in nature. 2. Mild bilateral atelectasis noted.  Lungs otherwise clear. 3. Mild cardiomegaly. 4. Mild-to-moderate left-sided chronic hydronephrosis, likely reflecting the previously noted staghorn calculus at the left kidney. Surrounding perinephric stranding noted. 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 These results were called by telephone at the time of interpretation on 01/16/2017 at 9:28 pm to Dr. Julianne Rice, who verbally acknowledged these results. Electronically Signed   By: Garald Balding M.D.   On: 01/16/2017 21:30   Dg Chest Port 1 View  Result Date: 01/16/2017 CLINICAL DATA:  Left-sided chest pain EXAM:  PORTABLE CHEST 1 VIEW COMPARISON:  11/30/2016, 08/24/2015 FINDINGS: Cardiomegaly with enlarged central pulmonary arteries and mild vascular congestion as before. Stable coarse interstitial opacities at the basic felt chronic. No consolidation or effusion. Aortic atherosclerosis. No pneumothorax. Advanced arthritis of the left shoulder. IMPRESSION: Cardiomegaly with mild central congestion.  No edema or infiltrate. Electronically Signed   By: Donavan Foil M.D.   On: 01/16/2017 17:48        Scheduled Meds: . apixaban  10 mg Oral BID   Followed by  . [START ON 01/24/2017] apixaban  5 mg Oral BID  . furosemide  40 mg Intravenous BID  . gabapentin  400 mg Oral Q lunch   And  . gabapentin  800 mg Oral Q supper   And  . gabapentin  1,200 mg Oral QHS  . umeclidinium-vilanterol  1 puff Inhalation Daily   Continuous Infusions:    LOS: 2 days    Time spent: 35 minutes  Birdie Hopes, MD [facility] Pager [phone]  If 7PM-7AM, please contact night-coverage [email or web address] Password [email or web address] 01/18/2017, 10:47 AM

## 2017-01-18 NOTE — Progress Notes (Addendum)
Benefit check sent on [name], RN        # 1. [place]  EFF-DATE 10-01-2010  CO-PAY- $ 3.70 FOR EACH RX

## 2017-01-18 NOTE — Progress Notes (Signed)
SATURATION QUALIFICATIONS: (This note is used to comply with regulatory documentation for home oxygen)  Patient Saturations on Room Air at Rest 90%  Patient Saturations on Room Air while Ambulating = 80%  Patient Saturations on 2 Liters of oxygen while Ambulating = 94%  Please briefly explain why patient needs home oxygen:

## 2017-01-19 LAB — BASIC METABOLIC PANEL
ANION GAP: 8 (ref 5–15)
BUN: 31 mg/dL — ABNORMAL HIGH (ref 6–20)
CO2: 33 mmol/L — ABNORMAL HIGH (ref 22–32)
Calcium: 9.8 mg/dL (ref 8.9–10.3)
Chloride: 97 mmol/L — ABNORMAL LOW (ref 101–111)
Creatinine, Ser: 1.28 mg/dL — ABNORMAL HIGH (ref 0.44–1.00)
GFR, EST AFRICAN AMERICAN: 49 mL/min — AB (ref >60)
GFR, EST NON AFRICAN AMERICAN: 42 mL/min — AB (ref >60)
Glucose, Bld: 119 mg/dL — ABNORMAL HIGH (ref 65–99)
POTASSIUM: 4.9 mmol/L (ref 3.5–5.1)
SODIUM: 138 mmol/L (ref 135–145)

## 2017-01-19 MED ORDER — APIXABAN 5 MG PO TABS: ORAL_TABLET | ORAL | 2 refills | Status: DC

## 2017-01-19 MED ORDER — CARVEDILOL 3.125 MG PO TABS: 3.1250 mg | ORAL_TABLET | Freq: Two times a day (BID) | ORAL | 0 refills | Status: DC

## 2017-01-19 MED ORDER — CEFUROXIME AXETIL 500 MG PO TABS: 500.0000 mg | ORAL_TABLET | Freq: Two times a day (BID) | ORAL | 0 refills | Status: DC

## 2017-01-19 NOTE — Plan of Care (Signed)
Problem: Safety: Goal: Ability to remain free from injury will improve Outcome: Progressing No incidence of falls during this admission. Call bell within reach. Bed in low and locked position. Clean and clear environment maintained. Patient alert and oriented. Patient verbalized understanding of safety instruction. Nonskid footwear being utilized. 2/4 siderails in place.  Problem: Activity: Goal: Risk for activity intolerance will decrease Outcome: Progressing Patient able to tolerate short distances with oxygen. Patient does still get SOB with exertion, but states it has improved since being admitted.

## 2017-01-19 NOTE — Progress Notes (Signed)
Discharged to home,no complaints of any pain or discomfort. PIV removed by NT. D/c instructions and follow up appointments discussed with pt  And verbalized understanding.
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
## 2017-01-20 ENCOUNTER — Other Ambulatory Visit: Payer: Self-pay | Admitting: Internal Medicine

## 2017-01-20 DIAGNOSIS — I10 Essential (primary) hypertension: Secondary | ICD-10-CM

## 2017-01-20 DIAGNOSIS — I5189 Other ill-defined heart diseases: Secondary | ICD-10-CM

## 2017-01-20 DIAGNOSIS — I519 Heart disease, unspecified: Secondary | ICD-10-CM

## 2017-01-20 LAB — URINE CULTURE: Culture: >=100000 — AB

## 2017-01-22 DIAGNOSIS — M47817 Spondylosis without myelopathy or radiculopathy, lumbosacral region: Secondary | ICD-10-CM | POA: Diagnosis not present

## 2017-01-22 DIAGNOSIS — Z79899 Other long term (current) drug therapy: Secondary | ICD-10-CM | POA: Diagnosis not present

## 2017-01-22 DIAGNOSIS — G894 Chronic pain syndrome: Secondary | ICD-10-CM | POA: Diagnosis not present

## 2017-01-22 DIAGNOSIS — M5137 Other intervertebral disc degeneration, lumbosacral region: Secondary | ICD-10-CM | POA: Diagnosis not present

## 2017-01-22 DIAGNOSIS — Z79891 Long term (current) use of opiate analgesic: Secondary | ICD-10-CM | POA: Diagnosis not present

## 2017-01-22 DIAGNOSIS — M4696 Unspecified inflammatory spondylopathy, lumbar region: Secondary | ICD-10-CM | POA: Diagnosis not present

## 2017-01-30 ENCOUNTER — Telehealth: Payer: Self-pay

## 2017-01-30 ENCOUNTER — Other Ambulatory Visit (INDEPENDENT_AMBULATORY_CARE_PROVIDER_SITE_OTHER): Payer: Medicare Other

## 2017-01-30 ENCOUNTER — Other Ambulatory Visit: Payer: Medicare Other

## 2017-01-30 ENCOUNTER — Ambulatory Visit (INDEPENDENT_AMBULATORY_CARE_PROVIDER_SITE_OTHER): Payer: Medicare Other | Admitting: Internal Medicine

## 2017-01-30 ENCOUNTER — Encounter: Payer: Self-pay | Admitting: Internal Medicine

## 2017-01-30 VITALS — BP 118/60 | HR 88 | Temp 98.9°F | Resp 16 | Ht 65.0 in | Wt 338.0 lb

## 2017-01-30 DIAGNOSIS — E785 Hyperlipidemia, unspecified: Secondary | ICD-10-CM

## 2017-01-30 DIAGNOSIS — I519 Heart disease, unspecified: Secondary | ICD-10-CM | POA: Diagnosis not present

## 2017-01-30 DIAGNOSIS — N200 Calculus of kidney: Secondary | ICD-10-CM

## 2017-01-30 DIAGNOSIS — I2699 Other pulmonary embolism without acute cor pulmonale: Secondary | ICD-10-CM | POA: Diagnosis not present

## 2017-01-30 DIAGNOSIS — N390 Urinary tract infection, site not specified: Secondary | ICD-10-CM

## 2017-01-30 DIAGNOSIS — N3001 Acute cystitis with hematuria: Secondary | ICD-10-CM | POA: Insufficient documentation

## 2017-01-30 DIAGNOSIS — Z86711 Personal history of pulmonary embolism: Secondary | ICD-10-CM | POA: Insufficient documentation

## 2017-01-30 DIAGNOSIS — I10 Essential (primary) hypertension: Secondary | ICD-10-CM

## 2017-01-30 DIAGNOSIS — I5189 Other ill-defined heart diseases: Secondary | ICD-10-CM

## 2017-01-30 DIAGNOSIS — R7303 Prediabetes: Secondary | ICD-10-CM

## 2017-01-30 LAB — COMPREHENSIVE METABOLIC PANEL
ALBUMIN: 3.7 g/dL (ref 3.5–5.2)
ALT: 16 U/L (ref 0–35)
AST: 15 U/L (ref 0–37)
Alkaline Phosphatase: 57 U/L (ref 39–117)
BUN: 26 mg/dL — AB (ref 6–23)
CHLORIDE: 105 meq/L (ref 96–112)
CO2: 29 mEq/L (ref 19–32)
CREATININE: 1 mg/dL (ref 0.40–1.20)
Calcium: 9 mg/dL (ref 8.4–10.5)
GFR: 70.78 mL/min (ref >60.00)
GLUCOSE: 146 mg/dL — AB (ref 70–99)
Potassium: 4 mEq/L (ref 3.5–5.1)
SODIUM: 139 meq/L (ref 135–145)
TOTAL PROTEIN: 6.6 g/dL (ref 6.0–8.3)
Total Bilirubin: 0.4 mg/dL (ref 0.2–1.2)

## 2017-01-30 LAB — CBC WITH DIFFERENTIAL/PLATELET
BASOS PCT: 1.4 % (ref 0.0–3.0)
Basophils Absolute: 0.1 10*3/uL (ref 0.0–0.1)
EOS ABS: 0.1 10*3/uL (ref 0.0–0.7)
Eosinophils Relative: 0.8 % (ref 0.0–5.0)
HCT: 42 % (ref 36.0–46.0)
HEMOGLOBIN: 13.7 g/dL (ref 12.0–15.0)
LYMPHS ABS: 2.3 10*3/uL (ref 0.7–4.0)
Lymphocytes Relative: 25 % (ref 12.0–46.0)
MCHC: 32.6 g/dL (ref 30.0–36.0)
MCV: 88.9 fl (ref 78.0–100.0)
MONO ABS: 0.8 10*3/uL (ref 0.1–1.0)
Monocytes Relative: 9.1 % (ref 3.0–12.0)
NEUTROS PCT: 63.7 % (ref 43.0–77.0)
Neutro Abs: 5.9 10*3/uL (ref 1.4–7.7)
PLATELETS: 142 10*3/uL — AB (ref 150.0–400.0)
RBC: 4.73 Mil/uL (ref 3.87–5.11)
RDW: 15.8 % — ABNORMAL HIGH (ref 11.5–15.5)
WBC: 9.2 10*3/uL (ref 4.0–10.5)

## 2017-01-30 LAB — LIPID PANEL
CHOLESTEROL: 179 mg/dL (ref 0–200)
HDL: 59.9 mg/dL (ref >39.00)
LDL Cholesterol: 93 mg/dL (ref 0–99)
NonHDL: 118.69
Total CHOL/HDL Ratio: 3
Triglycerides: 128 mg/dL (ref 0.0–149.0)
VLDL: 25.6 mg/dL (ref 0.0–40.0)

## 2017-01-30 LAB — TSH: TSH: 2.65 u[IU]/mL (ref 0.35–4.50)

## 2017-01-30 LAB — HEMOGLOBIN A1C: Hgb A1c MFr Bld: 6.5 % (ref 4.6–6.5)

## 2017-01-30 MED ORDER — APIXABAN 5 MG PO TABS: 5.0000 mg | ORAL_TABLET | Freq: Two times a day (BID) | ORAL | 5 refills | Status: DC

## 2017-01-30 MED ORDER — CARVEDILOL 3.125 MG PO TABS: 3.1250 mg | ORAL_TABLET | Freq: Two times a day (BID) | ORAL | 5 refills | Status: DC

## 2017-01-30 NOTE — Patient Instructions (Signed)

## 2017-01-30 NOTE — Progress Notes (Addendum)
Subjective:  Patient ID: Janice Morrow, female    DOB: 1945-10-25  Age: 71 y.o. MRN: [id number]  CC: Hypertension and Hyperlipidemia   HPI [name] presents for hosp f/u - She was recently admitted for respiratory failure and had a lung scan that was highly suspicious for pulmonary emboli and she was also treated for UTI and left renal stones. She has been anticoagulated with Ellik was. She returns today and tells me she feels much better. She's had no recent episodes of chest pain or shortness of breath. She still has intermittent episodes of left flank pain but she denies dysuria, hematuria, nausea, vomiting, fever, or chills. She is wheelchair-bound and has trouble controlling her urine and is requesting a prescription for pull-ups and pads.  Recommendations for Outpatient Follow-up:  1. Follow up with PCP in 1 weeks 2. Please obtain BMP/CBC in one week 3. Started on Eliquis (at least for 3 months), Ceftin for 5 more days. 4. Needs referral to urologist for left sided calculus and hydronephrosis after a UTI improves  Home Health: HHPT, Patient qualifies for oxygen but she refused it. Equipment/Devices:NA  Discharge Condition: Stable CODE STATUS: Full Code Diet recommendation: Diet Heart Room service appropriate? Yes; Fluid consistency: Thin; Fluid restriction: 1200 mL Fluid Diet - low sodium heart healthy  Brief/Interim Summary: Ottie Neglia Cookis a 71 y.o.femalewith history of diastolic CHF, COPD, sleep apnea, hypertension and chronic back pain was referred from the pain management center because patient was found to be short of breath and hypoxic. Patient states she gets epidural shots every 6 months and was due for one today. Patient states over the last few days patient has been progressively short of breath but became acutely worse today. Denies any chest pain or productive cough fever or chills. Becomes more short of breath on exertion and lying flat. Admits to have not  taken her Lasix last 1 week as she ran out of it. Patient also has not taken her Neurontin due to the same reason.  Discharge Diagnoses:  Principal Problem:   Acute respiratory failure with hypoxia (HCC) Active Problems:   COPD (chronic obstructive pulmonary disease) (HCC)   OSA on CPAP   Essential hypertension   Seizures (HCC)   Acute on chronic diastolic CHF (congestive heart failure) (HCC)   Kidney stone on left side    Acute respiratory failure with hypoxia -This is likely multifactorial secondary to acute on chronic diastolic CHF and PE, also probably has component of obesity hypoventilation syndrome.  Outpatient Medications Prior to Visit  Medication Sig Dispense Refill  . ANORO ELLIPTA 62.5-25 MCG/INH AEPB INHALE 1 PUFF INTO THE LUNGS DAILY. 60 each 11  . furosemide (LASIX) 40 MG tablet TAKE 1 TABLET (40 MG TOTAL) BY MOUTH DAILY. 90 tablet 1  . gabapentin (NEURONTIN) 400 MG capsule Take 400-1,200 mg by mouth 3 (three) times daily. TAKE 1 CAPSULE AT LUNCH, TAKE 2 CAPSULES AT SUPPER AND 3 CAPSULES AT BEDTIME    . HYDROcodone-acetaminophen (NORCO) 10-325 MG tablet Take 1 tablet by mouth every 6 (six) hours as needed. (Patient taking differently: Take 1 tablet by mouth every 6 (six) hours as needed for moderate pain. ) 90 tablet 0  . tiZANidine (ZANAFLEX) 4 MG tablet 1 TABLET TWICE A DAY AS NEEDED FOR MUSCLE RELAXER  2  . apixaban (ELIQUIS STARTER PACK) 5 MG TABS tablet Take 2 tablets BID for 7 days, then one tablet PO BID. 60 tablet 2  . carvedilol (COREG) 3.125 MG  tablet Take 1 tablet (3.125 mg total) by mouth 2 (two) times daily with a meal. 60 tablet 0   No facility-administered medications prior to visit.     ROS Review of Systems  Constitutional: Positive for activity change. Negative for appetite change, diaphoresis, fatigue, fever and unexpected weight change.  HENT: Negative.   Eyes: Negative for visual disturbance.  Respiratory: Negative for cough, chest  tightness, shortness of breath and wheezing.   Cardiovascular: Negative for chest pain, palpitations and leg swelling.  Gastrointestinal: Negative for abdominal pain, blood in stool, constipation, diarrhea, nausea and vomiting.  Endocrine: Negative.   Genitourinary: Positive for flank pain. Negative for decreased urine volume, difficulty urinating, dysuria, frequency, hematuria, urgency and vaginal bleeding.  Skin: Negative.   Neurological: Negative.   Hematological: Negative.  Negative for adenopathy. Does not bruise/bleed easily.  Psychiatric/Behavioral: Negative.     Objective:  BP 118/60 (BP Location: Left Arm, Patient Position: Sitting, Cuff Size: Large)   Pulse 88   Temp 98.9 F (37.2 C) (Oral)   Resp 16   Ht 5\' 5"  (1.651 m)   Wt (!) 338 lb (153.3 kg)   LMP  (LMP Unknown)   SpO2 95%   BMI 56.25 kg/m   BP Readings from Last 3 Encounters:  01/30/17 118/60  01/19/17 131/66  12/04/16 111/74    Wt Readings from Last 3 Encounters:  01/30/17 (!) 338 lb (153.3 kg)  01/19/17 (!) 324 lb 6.4 oz (147.1 kg)  12/04/16 (!) 325 lb 6.4 oz (147.6 kg)    Physical Exam  Constitutional: She is oriented to person, place, and time.  Non-toxic appearance. She does not have a sickly appearance. She appears ill. No distress.  Morbidly obese Wheelchair-bound  HENT:  Mouth/Throat: Oropharynx is clear and moist. No oropharyngeal exudate.  Eyes: Conjunctivae are normal. Right eye exhibits no discharge. Left eye exhibits no discharge. No scleral icterus.  Neck: Normal range of motion. Neck supple. No JVD present. No thyromegaly present.  Cardiovascular: Normal rate, regular rhythm and normal heart sounds.  Exam reveals no gallop.   No murmur heard. Pulmonary/Chest: Effort normal and breath sounds normal. No respiratory distress. She has no wheezes. She has no rales.  Abdominal: Soft. Bowel sounds are normal. She exhibits no mass. There is no tenderness. There is no guarding.  Musculoskeletal:  Normal range of motion. She exhibits no edema or tenderness.  Neurological: She is oriented to person, place, and time.  Skin: Skin is warm and dry. No rash noted. She is not diaphoretic. No erythema. No pallor.  Psychiatric: She has a normal mood and affect. Her behavior is normal. Judgment and thought content normal.    Lab Results  Component Value Date   WBC 9.2 01/30/2017   HGB 13.7 01/30/2017   HCT 42.0 01/30/2017   PLT 142.0 (L) 01/30/2017   GLUCOSE 146 (H) 01/30/2017   CHOL 179 01/30/2017   TRIG 128.0 01/30/2017   HDL 59.90 01/30/2017   LDLCALC 93 01/30/2017   ALT 16 01/30/2017   AST 15 01/30/2017   NA 139 01/30/2017   K 4.0 01/30/2017   CL 105 01/30/2017   CREATININE 1.00 01/30/2017   BUN 26 (H) 01/30/2017   CO2 29 01/30/2017   TSH 2.65 01/30/2017   INR 1.12 08/22/2014   HGBA1C 6.5 01/30/2017    Ct Angio Chest Pe W Or Wo Contrast  Result Date: 01/16/2017 CLINICAL DATA:  Acute onset of shortness of breath. Decreased O2 saturation. Dizziness and bilateral lower extremity swelling.  Initial encounter. EXAM: CT ANGIOGRAPHY CHEST WITH CONTRAST TECHNIQUE: Multidetector CT imaging of the chest was performed using the standard protocol during bolus administration of intravenous contrast. Multiplanar CT image reconstructions and MIPs were obtained to evaluate the vascular anatomy. CONTRAST:  70 mL of Isovue 370 IV contrast COMPARISON:  Chest radiograph performed earlier today at 5:34 p.m., and CTA of the chest performed 10/19/2014. CT of the abdomen and pelvis performed 12/02/2016 FINDINGS: Cardiovascular: Evaluation for pulmonary embolus is suboptimal due to limitations in the timing of the contrast bolus. Vague filling defects are suggested within segmental and subsegmental pulmonary arteries to the lower lobes bilaterally. Pulmonary emboli cannot be excluded. The heart is mildly enlarged. Mild calcification is noted along the aortic arch. The great vessels are grossly unremarkable in  appearance. Mediastinum/Nodes: The mediastinum is otherwise unremarkable. No mediastinal lymphadenopathy is seen. No pericardial effusion is identified. The visualized portions of the thyroid gland are unremarkable. No axillary lymphadenopathy is seen. Lungs/Pleura: Mild bilateral atelectasis is noted. No pleural effusion or pneumothorax is seen. No masses are identified. Upper Abdomen: The visualized portions of the liver and spleen are grossly unremarkable. The gallbladder is somewhat distended. The visualized portions of the pancreas and adrenal glands are within normal limits. There is mild to moderate chronic left-sided hydronephrosis, likely reflecting the previously noted staghorn calculus at the left kidney. Surrounding perinephric stranding is noted. 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 The visualized portions of the right kidney are unremarkable. Musculoskeletal: No acute osseous abnormalities are identified. The visualized musculature is unremarkable in appearance. Review of the MIP images confirms the above findings. IMPRESSION: 1. Evaluation for pulmonary embolus is suboptimal due to limitations in the timing of the contrast bolus. Vague filling defects suggested in segmental and subsegmental pulmonary arteries to the lower lobes bilaterally. Pulmonary emboli cannot be excluded, though this may be artifactual in nature. 2. Mild bilateral atelectasis noted.  Lungs otherwise clear. 3. Mild cardiomegaly. 4. Mild-to-moderate left-sided chronic hydronephrosis, likely reflecting the previously noted staghorn calculus at the left kidney. Surrounding perinephric stranding noted. 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 These results were called by telephone at the time of interpretation on 01/16/2017 at 9:28 pm to Dr. Julianne Rice, who verbally acknowledged these results. Electronically Signed   By: Garald Balding M.D.   On: 01/16/2017 21:30   Dg Chest Port 1  View  Result Date: 01/16/2017 CLINICAL DATA:  Left-sided chest pain EXAM: PORTABLE CHEST 1 VIEW COMPARISON:  11/30/2016, 08/24/2015 FINDINGS: Cardiomegaly with enlarged central pulmonary arteries and mild vascular congestion as before. Stable coarse interstitial opacities at the basic felt chronic. No consolidation or effusion. Aortic atherosclerosis. No pneumothorax. Advanced arthritis of the left shoulder. IMPRESSION: Cardiomegaly with mild central congestion.  No edema or infiltrate. Electronically Signed   By: Donavan Foil M.D.   On: 01/16/2017 17:48    Assessment & Plan:   Menaal was seen today for hypertension and hyperlipidemia.  Diagnoses and all orders for this visit:  Hyperlipidemia with target LDL less than 130- Her Framingham risk score is only 1% so I do not recommend that she start taking a statin for CV risk reduction -     Lipid panel; Future -     TSH; Future  Essential hypertension- her blood pressure is adequately well controlled, will continue carvedilol -     Comprehensive metabolic panel; Future -     CBC with Differential/Platelet; Future -     Urinalysis, Routine w reflex microscopic; Future -  carvedilol (COREG) 3.125 MG tablet; Take 1 tablet (3.125 mg total) by mouth 2 (two) times daily with a meal.  Left ventricular diastolic dysfunction, NYHA class 1- she has a normal volume status today, will continue the loop diuretic and carvedilol -     carvedilol (COREG) 3.125 MG tablet; Take 1 tablet (3.125 mg total) by mouth 2 (two) times daily with a meal.  Prediabetes- her A1c is up to 6.5%, she has mild type 2 diabetes mellitus with a medication is not needed to treat this, she agrees to work on her lifestyle modifications. -     Comprehensive metabolic panel; Future -     Hemoglobin A1c; Future  PE (pulmonary thromboembolism) ([place])- will continue Eliquis at 5 mg twice a day for the next 6 months. -     Discontinue: apixaban (ELIQUIS STARTER PACK) 5 MG TABS  tablet; Take 1 tablet (5 mg total) by mouth 2 (two) times daily. Take 2 tablets BID for 7 days, then one tablet PO BID. -     apixaban (ELIQUIS) 5 MG TABS tablet; Take 1 tablet (5 mg total) by mouth 2 (two) times daily.  Urinary tract infection without hematuria, site unspecified- she has no urinary symptoms, will recheck her UA and urine culture to be certain that the infection has resolved. -     Urinalysis, Routine w reflex microscopic; Future -     CULTURE, URINE COMPREHENSIVE; Future  Kidney stone on left side- I've asked her to see urology about this. -     Ambulatory referral to Urology   I have discontinued Ms. Leger's apixaban and apixaban. I am also having her start on apixaban. Additionally, I am having her maintain her HYDROcodone-acetaminophen, tiZANidine, ANORO ELLIPTA, gabapentin, furosemide, diclofenac sodium, and carvedilol.  Meds ordered this encounter  Medications  . diclofenac sodium (VOLTAREN) 1 % GEL    Sig: APPLY 4 GRAMS 3 TIMES A DAY AS NEEDED    Refill:  2  . DISCONTD: apixaban (ELIQUIS STARTER PACK) 5 MG TABS tablet    Sig: Take 1 tablet (5 mg total) by mouth 2 (two) times daily. Take 2 tablets BID for 7 days, then one tablet PO BID.    Dispense:  60 tablet    Refill:  5  . carvedilol (COREG) 3.125 MG tablet    Sig: Take 1 tablet (3.125 mg total) by mouth 2 (two) times daily with a meal.    Dispense:  60 tablet    Refill:  5  . apixaban (ELIQUIS) 5 MG TABS tablet    Sig: Take 1 tablet (5 mg total) by mouth 2 (two) times daily.    Dispense:  60 tablet    Refill:  5     Follow-up: Return in about 3 months (around 05/02/2017).  [name], MD

## 2017-01-31 NOTE — Telephone Encounter (Signed)
error 

## 2017-02-01 ENCOUNTER — Other Ambulatory Visit: Payer: Self-pay | Admitting: Internal Medicine

## 2017-02-15 ENCOUNTER — Telehealth: Payer: Self-pay | Admitting: Internal Medicine

## 2017-02-15 NOTE — Telephone Encounter (Signed)
Please call back with lab results from 6/6

## 2017-02-15 NOTE — Telephone Encounter (Signed)
Called pt but no vm to lm

## 2017-02-15 NOTE — Telephone Encounter (Signed)
Her blood sugar was mildly elevated  The other labs were okay

## 2017-02-15 NOTE — Telephone Encounter (Signed)
Pt called back regarding results states someone called her yesterday to give results she wants a call back today before 5

## 2017-02-18 NOTE — Telephone Encounter (Signed)
Patient called back in.  Gave MD response.

## 2017-02-19 ENCOUNTER — Emergency Department (HOSPITAL_COMMUNITY): Payer: Medicare Other

## 2017-02-19 ENCOUNTER — Emergency Department (HOSPITAL_COMMUNITY)
Admission: EM | Admit: 2017-02-19 | Discharge: 2017-02-19 | Disposition: A | Discharge status: Home / Self Care | Payer: Medicare Other | Attending: Emergency Medicine | Admitting: Emergency Medicine

## 2017-02-19 ENCOUNTER — Other Ambulatory Visit: Payer: Self-pay

## 2017-02-19 ENCOUNTER — Encounter (HOSPITAL_COMMUNITY): Payer: Self-pay

## 2017-02-19 ENCOUNTER — Ambulatory Visit: Payer: Medicare Other | Admitting: Internal Medicine

## 2017-02-19 DIAGNOSIS — I110 Hypertensive heart disease with heart failure: Secondary | ICD-10-CM | POA: Diagnosis not present

## 2017-02-19 DIAGNOSIS — Z79899 Other long term (current) drug therapy: Secondary | ICD-10-CM | POA: Diagnosis not present

## 2017-02-19 DIAGNOSIS — Z87891 Personal history of nicotine dependence: Secondary | ICD-10-CM | POA: Diagnosis not present

## 2017-02-19 DIAGNOSIS — M79602 Pain in left arm: Secondary | ICD-10-CM

## 2017-02-19 DIAGNOSIS — I5031 Acute diastolic (congestive) heart failure: Secondary | ICD-10-CM | POA: Diagnosis not present

## 2017-02-19 DIAGNOSIS — M25512 Pain in left shoulder: Secondary | ICD-10-CM | POA: Diagnosis not present

## 2017-02-19 DIAGNOSIS — J449 Chronic obstructive pulmonary disease, unspecified: Secondary | ICD-10-CM | POA: Diagnosis not present

## 2017-02-19 DIAGNOSIS — R011 Cardiac murmur, unspecified: Secondary | ICD-10-CM | POA: Diagnosis not present

## 2017-02-19 DIAGNOSIS — M79622 Pain in left upper arm: Secondary | ICD-10-CM | POA: Diagnosis not present

## 2017-02-19 DIAGNOSIS — Z79891 Long term (current) use of opiate analgesic: Secondary | ICD-10-CM | POA: Diagnosis not present

## 2017-02-19 DIAGNOSIS — R0602 Shortness of breath: Secondary | ICD-10-CM | POA: Diagnosis not present

## 2017-02-19 DIAGNOSIS — E785 Hyperlipidemia, unspecified: Secondary | ICD-10-CM | POA: Diagnosis not present

## 2017-02-19 DIAGNOSIS — Z86718 Personal history of other venous thrombosis and embolism: Secondary | ICD-10-CM | POA: Diagnosis not present

## 2017-02-19 DIAGNOSIS — M791 Myalgia: Secondary | ICD-10-CM | POA: Diagnosis not present

## 2017-02-19 DIAGNOSIS — M4696 Unspecified inflammatory spondylopathy, lumbar region: Secondary | ICD-10-CM | POA: Diagnosis not present

## 2017-02-19 DIAGNOSIS — R079 Chest pain, unspecified: Secondary | ICD-10-CM | POA: Diagnosis not present

## 2017-02-19 DIAGNOSIS — M5137 Other intervertebral disc degeneration, lumbosacral region: Secondary | ICD-10-CM | POA: Diagnosis not present

## 2017-02-19 DIAGNOSIS — G894 Chronic pain syndrome: Secondary | ICD-10-CM | POA: Diagnosis not present

## 2017-02-19 DIAGNOSIS — M25569 Pain in unspecified knee: Secondary | ICD-10-CM | POA: Diagnosis not present

## 2017-02-19 LAB — BASIC METABOLIC PANEL
Anion gap: 9 (ref 5–15)
BUN: 12 mg/dL (ref 6–20)
CALCIUM: 9.5 mg/dL (ref 8.9–10.3)
CO2: 25 mmol/L (ref 22–32)
CREATININE: 0.95 mg/dL (ref 0.44–1.00)
Chloride: 103 mmol/L (ref 101–111)
GFR calc Af Amer: >60 mL/min (ref >60)
GFR calc non Af Amer: >60 mL/min (ref >60)
Glucose, Bld: 101 mg/dL — ABNORMAL HIGH (ref 65–99)
Potassium: 4.5 mmol/L (ref 3.5–5.1)
SODIUM: 137 mmol/L (ref 135–145)

## 2017-02-19 LAB — CBC
HCT: 47.3 % — ABNORMAL HIGH (ref 36.0–46.0)
Hemoglobin: 14.8 g/dL (ref 12.0–15.0)
MCH: 28.7 pg (ref 26.0–34.0)
MCHC: 31.3 g/dL (ref 30.0–36.0)
MCV: 91.8 fL (ref 78.0–100.0)
PLATELETS: 226 10*3/uL (ref 150–400)
RBC: 5.15 MIL/uL — AB (ref 3.87–5.11)
RDW: 14.6 % (ref 11.5–15.5)
WBC: 8.8 10*3/uL (ref 4.0–10.5)

## 2017-02-19 LAB — I-STAT TROPONIN, ED
TROPONIN I, POC: 0.01 ng/mL (ref 0.00–0.08)
Troponin i, poc: 0 ng/mL (ref 0.00–0.08)

## 2017-02-19 NOTE — ED Notes (Signed)
Pt refused sling.

## 2017-02-19 NOTE — ED Provider Notes (Signed)
[facility] DEPT Provider Note   CSN: [id number] Arrival date & time: 02/19/17  1630     History   Chief Complaint Chief Complaint  Patient presents with  . Shortness of Breath    HPI Janice Morrow is a 71 y.o. female.  The history is provided by the patient and medical records. No language interpreter was used.  Illness  This is a recurrent problem. The current episode started 6 to 12 hours ago. The problem occurs every several days. The problem has been resolved. Pertinent negatives include no chest pain, no abdominal pain and no shortness of breath. Nothing aggravates the symptoms. Nothing relieves the symptoms. She has tried rest for the symptoms. The treatment provided no relief.    Past Medical History:  Diagnosis Date  . Acute diastolic CHF (congestive heart failure) ([place]) 08/24/2015  . Cervicalgia   . Chronic back pain   . COPD (chronic obstructive pulmonary disease) ([place])   . Essential hypertension 08/24/2015  . GERD (gastroesophageal reflux disease)   . Headache(784.0)   . Heart murmur   . Hyperlipidemia   . Migraine without aura, with intractable migraine, so stated, without mention of status migrainosus   . OSA on CPAP   . Osteoarthritis   . PVD (peripheral vascular disease) ([place])   . PVD (peripheral vascular disease) ([place])   . Seizures ([place]) 07/2014, 10/2014  . Shortness of breath dyspnea   . Trigeminal neuralgia     Patient Active Problem List   Diagnosis Date Noted  . PE (pulmonary thromboembolism) ([place]) 01/30/2017  . Acute cystitis without hematuria 01/30/2017  . Kidney stone on left side   . History of colonic polyps 07/31/2016  . Essential hypertension 08/24/2015  . Seizures ([place]) 08/24/2015  . Obesity hypoventilation syndrome ([place]) 08/24/2015  . Migraine without aura and with status migrainosus, not intractable 07/12/2015  . OSA on CPAP 07/12/2015  . Left ventricular diastolic dysfunction, NYHA class 1 04/11/2015  . Prediabetes 04/11/2015    . Visit for screening mammogram 03/08/2015  . Trigeminal neuralgia of right side of face 11/23/2014  . Urinary tract infection without hematuria 11/20/2014  . Chronic back pain 11/26/2013  . Hyperlipidemia with target LDL less than 130 09/01/2010  . Severe obesity (BMI >= 40) ([place]) 09/01/2010  . COPD (chronic obstructive pulmonary disease) ([place]) 09/01/2010  . GERD 09/01/2010  . OSTEOARTHRITIS 09/01/2010  . CATARACT, CONGENITAL, LEFT 09/01/2010    Past Surgical History:  Procedure Laterality Date  . MULTIPLE TOOTH EXTRACTIONS     "they took out part of my teeth; I took out the rest when they got loose; was suppose to get dentures; never did"    OB History    No data available       Home Medications    Prior to Admission medications   Medication Sig Start Date End Date Taking? Authorizing Provider  ANORO ELLIPTA 62.5-25 MCG/INH AEPB INHALE 1 PUFF INTO THE LUNGS DAILY. 03/22/16  Yes Janith Lima, MD  apixaban (ELIQUIS) 5 MG TABS tablet Take 1 tablet (5 mg total) by mouth 2 (two) times daily. Patient taking differently: Take 10 mg by mouth daily.  01/30/17  Yes Janith Lima, MD  carvedilol (COREG) 3.125 MG tablet Take 1 tablet (3.125 mg total) by mouth 2 (two) times daily with a meal. 01/30/17  Yes Janith Lima, MD  cefUROXime (CEFTIN) 500 MG tablet TAKE 1 TABLET BY MOUTH TWICE A DAY 02/02/17  Yes Janith Lima, MD  diclofenac sodium (  VOLTAREN) 1 % GEL APPLY 4 GRAMS 3 TIMES A DAY AS NEEDED 01/23/17  Yes [provider]  furosemide (LASIX) 40 MG tablet TAKE 1 TABLET (40 MG TOTAL) BY MOUTH DAILY. 01/20/17  Yes Janith Lima, MD  gabapentin (NEURONTIN) 400 MG capsule Take 400-1,200 mg by mouth See admin instructions. TAKE 1 CAPSULE AT LUNCH, TAKE 2 CAPSULES AT SUPPER AND 3 CAPSULES AT BEDTIME    Yes [provider]  HYDROcodone-acetaminophen (NORCO) 10-325 MG tablet Take 1 tablet by mouth every 6 (six) hours as needed. Patient taking differently: Take 1 tablet by  mouth every 6 (six) hours as needed for moderate pain.  09/15/15  Yes Janith Lima, MD  tiZANidine (ZANAFLEX) 4 MG tablet 1 TABLET TWICE A DAY AS NEEDED FOR MUSCLE RELAXER 02/21/16  Yes [provider]    Family History Family History  Problem Relation Age of Onset  . Heart attack Father   . Diabetes Mother   . Dementia Mother   . Diabetes Brother   . Heart disease Unknown   . Diabetes Unknown     Social History Social History  Substance Use Topics  . Smoking status: Former Smoker    Types: Cigarettes    Quit date: 08/22/2010  . Smokeless tobacco: Never Used  . Alcohol use No     Allergies   Baclofen and Naproxen   Review of Systems Review of Systems  Constitutional: Positive for fatigue. Negative for chills and fever.  HENT: Negative for ear pain and sore throat.   Eyes: Negative for pain and visual disturbance.  Respiratory: Negative for cough and shortness of breath.   Cardiovascular: Negative for chest pain and palpitations.  Gastrointestinal: Negative for abdominal pain and vomiting.  Genitourinary: Negative for dysuria and hematuria.  Musculoskeletal: Negative for arthralgias and back pain.       Positive for L upper arm pain  Skin: Negative for color change and rash.  Neurological: Negative for seizures and syncope.  All other systems reviewed and are negative.    Physical Exam Updated Vital Signs BP 110/70 (BP Location: Right Arm)   Pulse 80   Temp 98 F (36.7 C) (Oral)   Resp 16   LMP  (LMP Unknown)   SpO2 99%   Physical Exam   ED Treatments / Results  Labs (all labs ordered are listed, but only abnormal results are displayed) Labs Reviewed  BASIC METABOLIC PANEL - Abnormal; Notable for the following:       Result Value   Glucose, Bld 101 (*)    All other components within normal limits  CBC - Abnormal; Notable for the following:    RBC 5.15 (*)    HCT 47.3 (*)    All other components within normal limits  I-STAT TROPOININ, ED   I-STAT TROPOININ, ED    EKG  EKG Interpretation  Date/Time:  Tuesday February 19 2017 16:36:09 EDT Ventricular Rate:  77 PR Interval:  200 QRS Duration: 92 QT Interval:  382 QTC Calculation: 432 R Axis:   -33 Text Interpretation:  Normal sinus rhythm Left axis deviation Abnormal ECG No significant change since last tracing Confirmed by Deno Etienne [phone]) on 02/19/2017 8:07:58 PM       Radiology Dg Chest 2 View  Result Date: 02/19/2017 CLINICAL DATA:  Three-day history of left-sided chest pain radiating into the left arm associated with shortness of breath. Patient currently uses CPAP. EXAM: CHEST  2 VIEW COMPARISON:  01/16/2017, 11/30/2016 and earlier, including CTA  chest 01/16/2017. FINDINGS: AP erect and lateral images were obtained. Cardiac silhouette mildly to moderately enlarged, unchanged. Thoracic aorta atherosclerotic, unchanged. Left enlarged central pulmonary arteries as noted on the prior CT. Hilar and mediastinal contours otherwise unremarkable. Pulmonary venous hypertension without overt edema. Mildly prominent bronchovascular markings diffusely, unchanged. Linear scarring in the right middle lobe and lingula, unchanged. Lungs otherwise clear. No pleural effusions. Degenerative changes involving the thoracic and upper lumbar spine. IMPRESSION: Stable cardiomegaly. No acute cardiopulmonary disease. Scarring in the right middle lobe and lingula. Thoracic aortic atherosclerosis. Electronically Signed   By: Evangeline Dakin M.D.   On: 02/19/2017 17:41    Procedures Procedures (including critical care time)  Medications Ordered in ED Medications - No data to display   Initial Impression / Assessment and Plan / ED Course  I have reviewed the triage vital signs and the nursing notes.  Pertinent labs & imaging results that were available during my care of the patient were reviewed by me and considered in my medical decision making (see chart for details).     71 year old  female history of PE on eliquis, HTN, CHF, COPD, OSA on CPAP, PVD, chronic pain, obesity who presents with reported hypoxia at home and left upper arm pain.  She states her home oxygen saturation was 84% and she was told to come to ED for further evaluation.  She describes wearing CPAP "all the time".  She has no baseline oxygen requirement.  She is currently >94% on room air and speaking in full sentences without difficulty.  She also endorses left upper arm pain that radiates to her left axillary region.  Pain is reproducible on palpation and with movement.  She has been poorly compliant with her medicines over the past week.  She endorses diffuse pain and fatigue for several months.  She denies any fevers, vomiting, diarrhea,  dysuria, rash.  Afebrile, VSS.  Lungs clear to auscultation bilaterally.  Abdomen soft benign throughout.  TTP over right upper arm.  CBC, BMP unremarkable.  Initial and serial troponin WNL.  CXR showing no acute cardiopulmonary disease.  EKG showing normal sinus rhythm.  Suspect left upper arm muscular skeletal pain.  Patient declined sling for left upper extremity.  She is stable for continued outpatient follow-up.  Return precautions provided for worsening symptoms. Pt will f/u with PCP at first availability. Pt verbalized agreement with plan.  Pt care d/w Dr. Tyrone Nine  Final Clinical Impressions(s) / ED Diagnoses   Final diagnoses:  Left upper arm pain  Musculoskeletal arm pain, left    New Prescriptions Discharge Medication List as of 02/19/2017 11:28 PM       Janaki Exley, Pilar Plate, MD 02/20/17 [place], DO 02/20/17 [place], [place], DO 03/12/17 1503

## 2017-02-19 NOTE — ED Notes (Signed)
Pt verbalized understanding of d/c instructions and has no further questions. Pt stable and NAD. VSS.  

## 2017-02-19 NOTE — ED Notes (Signed)
This RN called the pain clinic and was told they sent the patient here because her room air sats were 84%. Pts O2 on arrival to ER pts oxygen sats 94% on room air. Pt speaking in clear complete sentences.

## 2017-02-19 NOTE — ED Notes (Signed)
Pt requested [place] sandwich and sprite, pt given each and eating at bedside.

## 2017-02-19 NOTE — ED Triage Notes (Addendum)
Pt reports SOB and left arm pain. She states the left arm pain started 3 days ago but can not tell me when the SOB started. Pt provided this RN with a number to the pain clinic "call them, they can tell you what is wrong with me." number to pain clinic is [phone], ask for Pam. Pt denies chest pain.

## 2017-02-19 NOTE — ED Notes (Signed)
This RN noted pt to have oxygen saturations at 80% with good pleth. Placed pt on 2L [place] and saturations improved to 90%. MD notified. Will cont to monitor.

## 2017-02-19 NOTE — ED Notes (Signed)
Pt assisted to bedside commode

## 2017-03-19 DIAGNOSIS — M25569 Pain in unspecified knee: Secondary | ICD-10-CM | POA: Diagnosis not present

## 2017-03-19 DIAGNOSIS — M5137 Other intervertebral disc degeneration, lumbosacral region: Secondary | ICD-10-CM | POA: Diagnosis not present

## 2017-03-19 DIAGNOSIS — Z79899 Other long term (current) drug therapy: Secondary | ICD-10-CM | POA: Diagnosis not present

## 2017-03-19 DIAGNOSIS — G894 Chronic pain syndrome: Secondary | ICD-10-CM | POA: Diagnosis not present

## 2017-03-19 DIAGNOSIS — Z79891 Long term (current) use of opiate analgesic: Secondary | ICD-10-CM | POA: Diagnosis not present

## 2017-03-19 DIAGNOSIS — M47817 Spondylosis without myelopathy or radiculopathy, lumbosacral region: Secondary | ICD-10-CM | POA: Diagnosis not present

## 2017-03-22 ENCOUNTER — Other Ambulatory Visit: Payer: Self-pay

## 2017-04-08 ENCOUNTER — Ambulatory Visit: Payer: Medicare Other | Admitting: Internal Medicine

## 2017-04-16 DIAGNOSIS — M5137 Other intervertebral disc degeneration, lumbosacral region: Secondary | ICD-10-CM | POA: Diagnosis not present

## 2017-04-16 DIAGNOSIS — M4696 Unspecified inflammatory spondylopathy, lumbar region: Secondary | ICD-10-CM | POA: Diagnosis not present

## 2017-04-16 DIAGNOSIS — Z79891 Long term (current) use of opiate analgesic: Secondary | ICD-10-CM | POA: Diagnosis not present

## 2017-04-16 DIAGNOSIS — G894 Chronic pain syndrome: Secondary | ICD-10-CM | POA: Diagnosis not present

## 2017-04-16 DIAGNOSIS — M25569 Pain in unspecified knee: Secondary | ICD-10-CM | POA: Diagnosis not present

## 2017-04-16 DIAGNOSIS — Z79899 Other long term (current) drug therapy: Secondary | ICD-10-CM | POA: Diagnosis not present

## 2017-05-06 ENCOUNTER — Other Ambulatory Visit: Payer: Self-pay | Admitting: Urology

## 2017-05-06 DIAGNOSIS — N200 Calculus of kidney: Secondary | ICD-10-CM | POA: Diagnosis not present

## 2017-05-10 ENCOUNTER — Other Ambulatory Visit: Payer: Self-pay | Admitting: Internal Medicine

## 2017-05-10 DIAGNOSIS — J410 Simple chronic bronchitis: Secondary | ICD-10-CM

## 2017-05-13 ENCOUNTER — Ambulatory Visit (HOSPITAL_COMMUNITY): Payer: Medicare Other

## 2017-05-16 ENCOUNTER — Ambulatory Visit (HOSPITAL_COMMUNITY): Payer: Medicare Other

## 2017-05-20 ENCOUNTER — Ambulatory Visit (INDEPENDENT_AMBULATORY_CARE_PROVIDER_SITE_OTHER): Payer: Medicare Other | Admitting: Internal Medicine

## 2017-05-20 ENCOUNTER — Encounter: Payer: Self-pay | Admitting: Internal Medicine

## 2017-05-20 ENCOUNTER — Other Ambulatory Visit (INDEPENDENT_AMBULATORY_CARE_PROVIDER_SITE_OTHER): Payer: Medicare Other

## 2017-05-20 VITALS — BP 140/70 | HR 73 | Temp 98.2°F | Resp 16 | Ht 65.0 in | Wt 346.8 lb

## 2017-05-20 DIAGNOSIS — I10 Essential (primary) hypertension: Secondary | ICD-10-CM | POA: Diagnosis not present

## 2017-05-20 DIAGNOSIS — E118 Type 2 diabetes mellitus with unspecified complications: Secondary | ICD-10-CM

## 2017-05-20 DIAGNOSIS — N939 Abnormal uterine and vaginal bleeding, unspecified: Secondary | ICD-10-CM | POA: Insufficient documentation

## 2017-05-20 DIAGNOSIS — N200 Calculus of kidney: Secondary | ICD-10-CM

## 2017-05-20 DIAGNOSIS — G8929 Other chronic pain: Secondary | ICD-10-CM | POA: Diagnosis not present

## 2017-05-20 DIAGNOSIS — N920 Excessive and frequent menstruation with regular cycle: Secondary | ICD-10-CM | POA: Diagnosis not present

## 2017-05-20 DIAGNOSIS — M5442 Lumbago with sciatica, left side: Secondary | ICD-10-CM | POA: Diagnosis not present

## 2017-05-20 DIAGNOSIS — Q120 Congenital cataract: Secondary | ICD-10-CM

## 2017-05-20 DIAGNOSIS — M5441 Lumbago with sciatica, right side: Secondary | ICD-10-CM

## 2017-05-20 LAB — BASIC METABOLIC PANEL
BUN: 16 mg/dL (ref 6–23)
CHLORIDE: 104 meq/L (ref 96–112)
CO2: 28 meq/L (ref 19–32)
Calcium: 9.7 mg/dL (ref 8.4–10.5)
Creatinine, Ser: 0.93 mg/dL (ref 0.40–1.20)
GFR: 76.89 mL/min (ref >60.00)
GLUCOSE: 109 mg/dL — AB (ref 70–99)
Potassium: 4.3 mEq/L (ref 3.5–5.1)
Sodium: 138 mEq/L (ref 135–145)

## 2017-05-20 LAB — MICROALBUMIN / CREATININE URINE RATIO
CREATININE, U: 152.9 mg/dL
MICROALB UR: 9.8 mg/dL — AB (ref 0.0–1.9)
Microalb Creat Ratio: 6.4 mg/g (ref 0.0–30.0)

## 2017-05-20 NOTE — Progress Notes (Addendum)
Subjective:  Patient ID: Janice Morrow, female    DOB: May 01, 1946  Age: 71 y.o. MRN: [id number]  CC: Hypertension and Diabetes   HPI Janice Morrow presents for f/up - She complains of chronic low back pain and intermittent left flank pain. She has also had a few chills. She is seeing a urologist for evaluation and treatment of kidney stones. She also complains of a few episodes of vaginal spotting over the last few weeks. She complains of chronic back pain and wants to try different muscle relaxer. The one she is currently taking is not effective.    Outpatient Medications Prior to Visit  Medication Sig Dispense Refill  . apixaban (ELIQUIS) 5 MG TABS tablet Take 1 tablet (5 mg total) by mouth 2 (two) times daily. (Patient taking differently: Take 10 mg by mouth daily. ) 60 tablet 5  . carvedilol (COREG) 3.125 MG tablet Take 1 tablet (3.125 mg total) by mouth 2 (two) times daily with a meal. 60 tablet 5  . diclofenac sodium (VOLTAREN) 1 % GEL APPLY 4 GRAMS 3 TIMES A DAY AS NEEDED  2  . furosemide (LASIX) 40 MG tablet TAKE 1 TABLET (40 MG TOTAL) BY MOUTH DAILY. 90 tablet 1  . gabapentin (NEURONTIN) 400 MG capsule Take 400-1,200 mg by mouth See admin instructions. TAKE 1 CAPSULE AT LUNCH, TAKE 2 CAPSULES AT SUPPER AND 3 CAPSULES AT BEDTIME     . HYDROcodone-acetaminophen (NORCO) 10-325 MG tablet Take 1 tablet by mouth every 6 (six) hours as needed. (Patient taking differently: Take 1 tablet by mouth every 6 (six) hours as needed for moderate pain. ) 90 tablet 0  . umeclidinium-vilanterol (ANORO ELLIPTA) 62.5-25 MCG/INH AEPB Inhale 1 puff into the lungs daily. 60 each 7  . tiZANidine (ZANAFLEX) 4 MG tablet 1 TABLET TWICE A DAY AS NEEDED FOR MUSCLE RELAXER  2  . cefUROXime (CEFTIN) 500 MG tablet TAKE 1 TABLET BY MOUTH TWICE A DAY 10 tablet 0   No facility-administered medications prior to visit.     ROS Review of Systems  Constitutional: Positive for chills. Negative for fatigue, fever  and unexpected weight change.  HENT: Negative.  Negative for sinus pressure, sore throat and trouble swallowing.   Eyes: Negative for visual disturbance.  Respiratory: Negative for cough, chest tightness, shortness of breath and wheezing.   Cardiovascular: Negative for chest pain, palpitations and leg swelling.  Gastrointestinal: Negative for abdominal pain, constipation, diarrhea, nausea and vomiting.  Endocrine: Negative.   Genitourinary: Positive for flank pain and vaginal bleeding. Negative for decreased urine volume, difficulty urinating, pelvic pain, urgency, vaginal discharge and vaginal pain.  Musculoskeletal: Positive for back pain. Negative for myalgias and neck pain.  Skin: Negative.  Negative for color change and rash.  Allergic/Immunologic: Negative.   Neurological: Negative.  Negative for dizziness, weakness, light-headedness and headaches.  Hematological: Negative.  Negative for adenopathy. Does not bruise/bleed easily.  Psychiatric/Behavioral: Negative.     Objective:  BP 140/70 (BP Location: Left Arm, Patient Position: Sitting, Cuff Size: Large)   Pulse 73   Temp 98.2 F (36.8 C) (Oral)   Resp 16   Ht 5\' 5"  (1.651 m)   Wt (!) 346 lb 12 oz (157.3 kg)   LMP  (LMP Unknown)   SpO2 92%   BMI 57.70 kg/m   BP Readings from Last 3 Encounters:  05/20/17 140/70  02/19/17 110/70  01/30/17 118/60    Wt Readings from Last 3 Encounters:  05/20/17 (!) 346 lb 12  oz (157.3 kg)  01/30/17 (!) 338 lb (153.3 kg)  01/19/17 (!) 324 lb 6.4 oz (147.1 kg)    Physical Exam  Constitutional: She is oriented to person, place, and time.  Non-toxic appearance. She does not have a sickly appearance. She appears ill. No distress.  Morbidly obese Wheelchair-bound She can only take a few steps without assistance  HENT:  Mouth/Throat: Oropharynx is clear and moist. No oropharyngeal exudate.  Eyes: Conjunctivae are normal. Right eye exhibits no discharge. Left eye exhibits no discharge. No  scleral icterus.  Neck: Normal range of motion. Neck supple. No JVD present. No thyromegaly present.  Cardiovascular: Normal rate, regular rhythm, S1 normal, S2 normal and intact distal pulses.  Exam reveals no gallop and no friction rub.   Murmur heard.  Systolic murmur is present with a grade of 1/6   No diastolic murmur is present  Pulmonary/Chest: Effort normal and breath sounds normal. No respiratory distress. She has no wheezes. She has no rales. She exhibits no tenderness.  Abdominal: Soft. Bowel sounds are normal. She exhibits no distension and no mass. There is no tenderness. There is no rebound and no guarding.  Musculoskeletal: Normal range of motion. She exhibits no edema, tenderness or deformity.  Lymphadenopathy:    She has no cervical adenopathy.  Neurological: She is alert and oriented to person, place, and time.  Skin: Skin is warm and dry. No rash noted. She is not diaphoretic. No erythema. No pallor.  Vitals reviewed.   Lab Results  Component Value Date   WBC 8.8 02/19/2017   HGB 14.8 02/19/2017   HCT 47.3 (H) 02/19/2017   PLT 226 02/19/2017   GLUCOSE 109 (H) 05/20/2017   CHOL 179 01/30/2017   TRIG 128.0 01/30/2017   HDL 59.90 01/30/2017   LDLCALC 93 01/30/2017   ALT 16 01/30/2017   AST 15 01/30/2017   NA 138 05/20/2017   K 4.3 05/20/2017   CL 104 05/20/2017   CREATININE 0.93 05/20/2017   BUN 16 05/20/2017   CO2 28 05/20/2017   TSH 2.65 01/30/2017   INR 1.12 08/22/2014   HGBA1C 6.1 05/20/2017   MICROALBUR 9.8 (H) 05/20/2017    Dg Chest 2 View  Result Date: 02/19/2017 CLINICAL DATA:  Three-day history of left-sided chest pain radiating into the left arm associated with shortness of breath. Patient currently uses CPAP. EXAM: CHEST  2 VIEW COMPARISON:  01/16/2017, 11/30/2016 and earlier, including CTA chest 01/16/2017. FINDINGS: AP erect and lateral images were obtained. Cardiac silhouette mildly to moderately enlarged, unchanged. Thoracic aorta  atherosclerotic, unchanged. Left enlarged central pulmonary arteries as noted on the prior CT. Hilar and mediastinal contours otherwise unremarkable. Pulmonary venous hypertension without overt edema. Mildly prominent bronchovascular markings diffusely, unchanged. Linear scarring in the right middle lobe and lingula, unchanged. Lungs otherwise clear. No pleural effusions. Degenerative changes involving the thoracic and upper lumbar spine. IMPRESSION: Stable cardiomegaly. No acute cardiopulmonary disease. Scarring in the right middle lobe and lingula. Thoracic aortic atherosclerosis. Electronically Signed   By: Evangeline Dakin M.D.   On: 02/19/2017 17:41    Assessment & Plan:   Janice Morrow was seen today for hypertension and diabetes.  Diagnoses and all orders for this visit:  Vaginal spotting -     Ambulatory referral to Gynecology  Essential hypertension- her blood pressures well controlled. Electrolytes and renal function are normal. -     Basic metabolic panel; Future -     Urinalysis, Routine w reflex microscopic; Future  Congenital cataract of  left eye  Chronic midline low back pain with bilateral sciatica- will try Flexeril. -     cyclobenzaprine (FLEXERIL) 5 MG tablet; Take 1 tablet (5 mg total) by mouth 3 (three) times daily as needed for muscle spasms.  Severe obesity (BMI >= 40) ([place])- I don't think she has inability to lose weight.  Type 2 diabetes mellitus with complication, without long-term current use of insulin ([place])- her A1c is 6.1%. Her blood sugars are very well controlled. -     Ambulatory referral to Ophthalmology -     Basic metabolic panel; Future -     Hemoglobin A1c; Future -     Urinalysis, Routine w reflex microscopic; Future -     Microalbumin / creatinine urine ratio; Future  Kidney stone on left side- her urinalysis is abnormal but she does not have symptoms of a urinary tract infection. If her culture is positive then will start antibiotic therapy. For now  she agrees to follow up with urology as scheduled within the next few weeks. -     CULTURE, URINE COMPREHENSIVE; Future   I have discontinued Janice Morrow's tiZANidine and cefUROXime. I am also having her start on cyclobenzaprine. Additionally, I am having her maintain her HYDROcodone-acetaminophen, gabapentin, furosemide, diclofenac sodium, carvedilol, apixaban, and umeclidinium-vilanterol.  Meds ordered this encounter  Medications  . cyclobenzaprine (FLEXERIL) 5 MG tablet    Sig: Take 1 tablet (5 mg total) by mouth 3 (three) times daily as needed for muscle spasms.    Dispense:  75 tablet    Refill:  3     Follow-up: Return in about 6 months (around 11/17/2017).  [name], MD

## 2017-05-20 NOTE — Patient Instructions (Signed)

## 2017-05-21 DIAGNOSIS — Z79899 Other long term (current) drug therapy: Secondary | ICD-10-CM | POA: Diagnosis not present

## 2017-05-21 DIAGNOSIS — G894 Chronic pain syndrome: Secondary | ICD-10-CM | POA: Diagnosis not present

## 2017-05-21 DIAGNOSIS — Z79891 Long term (current) use of opiate analgesic: Secondary | ICD-10-CM | POA: Diagnosis not present

## 2017-05-21 DIAGNOSIS — M25569 Pain in unspecified knee: Secondary | ICD-10-CM | POA: Diagnosis not present

## 2017-05-21 DIAGNOSIS — M4696 Unspecified inflammatory spondylopathy, lumbar region: Secondary | ICD-10-CM | POA: Diagnosis not present

## 2017-05-21 DIAGNOSIS — M5137 Other intervertebral disc degeneration, lumbosacral region: Secondary | ICD-10-CM | POA: Diagnosis not present

## 2017-05-21 LAB — URINALYSIS, ROUTINE W REFLEX MICROSCOPIC
BILIRUBIN URINE: NEGATIVE
KETONES UR: NEGATIVE
NITRITE: POSITIVE — AB
Total Protein, Urine: 30 — AB
URINE GLUCOSE: NEGATIVE
Urobilinogen, UA: 0.2 (ref 0.0–1.0)
pH: 5.5 (ref 5.0–8.0)

## 2017-05-21 LAB — HEMOGLOBIN A1C: Hgb A1c MFr Bld: 6.1 % (ref 4.6–6.5)

## 2017-05-22 ENCOUNTER — Telehealth: Payer: Self-pay | Admitting: Internal Medicine

## 2017-05-22 MED ORDER — CYCLOBENZAPRINE HCL 5 MG PO TABS: 5.0000 mg | ORAL_TABLET | Freq: Three times a day (TID) | ORAL | 3 refills | Status: DC | PRN

## 2017-05-22 NOTE — Telephone Encounter (Signed)
I have sent a prescription for muscle relaxer We do not have any samples of eliquis

## 2017-05-22 NOTE — Telephone Encounter (Signed)
Left detailed message for patient with MD response.

## 2017-05-22 NOTE — Telephone Encounter (Signed)
Pt called stating that she spoke with Dr Ronnald Ramp about prescribing a muscle relaxer but it had not been sent in yet. She also requested samples of her blood thinner medication. Please advise.

## 2017-05-23 ENCOUNTER — Telehealth: Payer: Self-pay | Admitting: Obstetrics and Gynecology

## 2017-05-23 ENCOUNTER — Other Ambulatory Visit: Payer: Self-pay | Admitting: Internal Medicine

## 2017-05-23 DIAGNOSIS — N3001 Acute cystitis with hematuria: Secondary | ICD-10-CM

## 2017-05-23 MED ORDER — LEVOFLOXACIN 750 MG PO TABS: 750.0000 mg | ORAL_TABLET | Freq: Every day | ORAL | 0 refills | Status: DC

## 2017-05-24 ENCOUNTER — Other Ambulatory Visit: Payer: Self-pay | Admitting: Internal Medicine

## 2017-05-24 DIAGNOSIS — N3001 Acute cystitis with hematuria: Secondary | ICD-10-CM

## 2017-05-24 LAB — CULTURE, URINE COMPREHENSIVE
MICRO NUMBER:: 81054137
SPECIMEN QUALITY: ADEQUATE

## 2017-05-24 MED ORDER — NITROFURANTOIN MONOHYD MACRO 100 MG PO CAPS: 100.0000 mg | ORAL_CAPSULE | Freq: Two times a day (BID) | ORAL | 0 refills | Status: AC

## 2017-05-27 ENCOUNTER — Encounter: Payer: Self-pay | Admitting: Obstetrics and Gynecology

## 2017-05-27 ENCOUNTER — Ambulatory Visit (INDEPENDENT_AMBULATORY_CARE_PROVIDER_SITE_OTHER): Payer: Medicare Other | Admitting: Obstetrics and Gynecology

## 2017-05-27 ENCOUNTER — Other Ambulatory Visit (HOSPITAL_COMMUNITY)
Admission: RE | Admit: 2017-05-27 | Discharge: 2017-05-27 | Disposition: A | Discharge status: Home / Self Care | Payer: Medicare Other | Source: Ambulatory Visit | Attending: Obstetrics and Gynecology | Admitting: Obstetrics and Gynecology

## 2017-05-27 VITALS — BP 140/96 | HR 60

## 2017-05-27 DIAGNOSIS — N871 Moderate cervical dysplasia: Secondary | ICD-10-CM | POA: Insufficient documentation

## 2017-05-27 DIAGNOSIS — Z124 Encounter for screening for malignant neoplasm of cervix: Secondary | ICD-10-CM | POA: Insufficient documentation

## 2017-05-27 DIAGNOSIS — N950 Postmenopausal bleeding: Secondary | ICD-10-CM | POA: Diagnosis not present

## 2017-05-27 LAB — HM PAP SMEAR

## 2017-05-27 NOTE — Progress Notes (Signed)
Patient scheduled while in office for PUS. Patient declined appointment for 06/06/17, request PUS for 06/13/17. Patient scheduled for PUS on 06/13/17 at 9:30am with consult to follow at 10am with Dr. Quincy Simmonds. Patient aware to arrive 15 min prior to appointment and cancellation policy, verbalizes understanding and is agreeable.

## 2017-05-27 NOTE — Patient Instructions (Signed)
Postmenopausal Bleeding Postmenopausal bleeding is any bleeding after menopause. Menopause is when a woman's period stops. Any type of bleeding after menopause is concerning. It should be checked by your doctor. Any treatment will depend on the cause. Follow these instructions at home: Watch your condition for any changes.  Avoid the use of tampons and douches as told by your doctor.  Change your pads often.  Get regular pelvic exams and Pap tests.  Keep all appointments for tests as told by your doctor.  Contact a doctor if:  Your bleeding lasts for more than 1 week.  You have belly (abdominal) pain.  You have bleeding after sex (intercourse). Get help right away if:  You have a fever, chills, a headache, dizziness, muscle aches, and bleeding.  You have strong pain with bleeding.  You have clumps of blood (blood clots) coming from your vagina.  You have bleeding and need more than 1 pad an hour.  You feel like you are going to pass out (faint). This information is not intended to replace advice given to you by your health care provider. Make sure you discuss any questions you have with your health care provider. Document Released: 05/22/2008 Document Revised: 01/19/2016 Document Reviewed: 03/12/2013 Elsevier Interactive Patient Education  2017 Elsevier Inc.  

## 2017-05-27 NOTE — Progress Notes (Signed)
GYNECOLOGY  VISIT   HPI: 71 y.o.   Single  African American  female   [phone] with Patient's last menstrual period was 08/27/1993 (approximate).   here for postmenopausal bleeding.  Notes the vaginal spotting since July 2018.  No pelvic pain.  Patient states has a large left kidney stone and feels this is where the bleeding is coming from along with taking Eliquis.  Has a current UTI with E Coli and she is being treated with Macrobid.   Goes to the pain clinic and went there on 05/21/17.  She noted a black curly thing in her urine at that time.  Not sexually active since age 18.   Daughter died at age 66 from a gun shot wound.   PCP - Dr. Ronnald Ramp.   GYNECOLOGIC HISTORY: Patient's last menstrual period was 08/27/1993 (approximate). Contraception:  Postmenopausal Menopausal hormone therapy:  none Last mammogram: 03-18-15 Diag.Bil.3D due to bloody nipple discharge of right breast--SEE Epic Last pap smear: Unsure--years ago        OB History    Gravida Para Term Preterm AB Living   4 1 1   3  0   SAB TAB Ectopic Multiple Live Births   3              Obstetric Comments   Dec in [place]         Patient Active Problem List   Diagnosis Date Noted  . Vaginal spotting 05/20/2017  . Type 2 diabetes mellitus with complication, without long-term current use of insulin ([place]) 05/20/2017  . PE (pulmonary thromboembolism) ([place]) 01/30/2017  . Acute cystitis with hematuria 01/30/2017  . Kidney stone on left side   . History of colonic polyps 07/31/2016  . Essential hypertension 08/24/2015  . Seizures ([place]) 08/24/2015  . Obesity hypoventilation syndrome ([place]) 08/24/2015  . Migraine without aura and with status migrainosus, not intractable 07/12/2015  . OSA on CPAP 07/12/2015  . Left ventricular diastolic dysfunction, NYHA class 1 04/11/2015  . Visit for screening mammogram 03/08/2015  . Trigeminal neuralgia of right side of face 11/23/2014  . Chronic back pain 11/26/2013  .  Hyperlipidemia with target LDL less than 130 09/01/2010  . Severe obesity (BMI >= 40) ([place]) 09/01/2010  . COPD (chronic obstructive pulmonary disease) ([place]) 09/01/2010  . GERD 09/01/2010  . OSTEOARTHRITIS 09/01/2010    Past Medical History:  Diagnosis Date  . Acute diastolic CHF (congestive heart failure) ([place]) 08/24/2015  . Cervicalgia   . Chronic back pain   . COPD (chronic obstructive pulmonary disease) ([place])   . Essential hypertension 08/24/2015  . GERD (gastroesophageal reflux disease)   . Headache(784.0)   . Heart murmur   . Hyperlipidemia   . Migraine without aura, with intractable migraine, so stated, without mention of status migrainosus   . OSA on CPAP   . Osteoarthritis   . PVD (peripheral vascular disease) ([place])   . PVD (peripheral vascular disease) ([place])   . Seizures ([place]) 07/2014, 10/2014  . Shortness of breath dyspnea   . Trigeminal neuralgia     Past Surgical History:  Procedure Laterality Date  . MULTIPLE TOOTH EXTRACTIONS     "they took out part of my teeth; I took out the rest when they got loose; was suppose to get dentures; never did"    Current Outpatient Prescriptions  Medication Sig Dispense Refill  . apixaban (ELIQUIS) 5 MG TABS tablet Take 1 tablet (5 mg total) by mouth 2 (two) times daily. (Patient taking differently:  Take 10 mg by mouth daily. ) 60 tablet 5  . carvedilol (COREG) 3.125 MG tablet Take 1 tablet (3.125 mg total) by mouth 2 (two) times daily with a meal. 60 tablet 5  . diclofenac sodium (VOLTAREN) 1 % GEL APPLY 4 GRAMS 3 TIMES A DAY AS NEEDED  2  . furosemide (LASIX) 40 MG tablet TAKE 1 TABLET (40 MG TOTAL) BY MOUTH DAILY. 90 tablet 1  . gabapentin (NEURONTIN) 400 MG capsule Take 400-1,200 mg by mouth See admin instructions. TAKE 1 CAPSULE AT LUNCH, TAKE 2 CAPSULES AT SUPPER AND 3 CAPSULES AT BEDTIME     . HYDROcodone-acetaminophen (NORCO) 10-325 MG tablet Take 1 tablet by mouth every 6 (six) hours as needed. (Patient taking differently:  Take 1 tablet by mouth every 6 (six) hours as needed for moderate pain. ) 90 tablet 0  . nitrofurantoin, macrocrystal-monohydrate, (MACROBID) 100 MG capsule Take 1 capsule (100 mg total) by mouth 2 (two) times daily. 14 capsule 0  . umeclidinium-vilanterol (ANORO ELLIPTA) 62.5-25 MCG/INH AEPB Inhale 1 puff into the lungs daily. 60 each 7   No current facility-administered medications for this visit.      ALLERGIES: Baclofen and Naproxen  Family History  Problem Relation Age of Onset  . Heart attack Father   . Diabetes Mother   . Dementia Mother   . Diabetes Brother   . Heart disease Unknown   . Diabetes Unknown     Social History   Social History  . Marital status: Single    Spouse name: N/A  . Number of children: 1  . Years of education: 12th   Occupational History  . DISABLED Unemployed   Social History Main Topics  . Smoking status: Former Smoker    Types: Cigarettes    Quit date: 08/22/2010  . Smokeless tobacco: Never Used  . Alcohol use No  . Drug use: No  . Sexual activity: No   Other Topics Concern  . Not on file   Social History Narrative   Patient lives at home with her brother.    Disabled.   Education high school.   Right handed.   Caffeine Use: 5-6 20oz bottle sodas daily    ROS:  Pertinent items are noted in HPI.  PHYSICAL EXAMINATION:    BP (!) 140/96 (BP Location: Left Arm, Patient Position: Sitting, Cuff Size: Large)   Pulse 60   LMP 08/27/1993 (Approximate)     General appearance: alert, cooperative and appears stated age.  Uses a wheel chair.  Head: Normocephalic, without obvious abnormality, atraumatic Neck: no adenopathy, supple, symmetrical, trachea midline and thyroid normal to inspection and palpation Lungs: clear to auscultation bilaterally Heart: regular rate and rhythm Abdomen: obesity and pannus, soft, non-tender, no masses,  no organomegaly Extremities: extremities normal, atraumatic, no cyanosis or edema Skin: Skin color,  texture, turgor normal. No rashes or lesions No abnormal inguinal nodes palpated Neurologic: Grossly normal  Pelvic: External genitalia:  no lesions              Urethra:  normal appearing urethra with no masses, tenderness or lesions              Bartholins and Skenes: normal                 Vagina: normal appearing vagina with normal color and discharge, no lesions              Cervix: no lesions  Bimanual Exam:  Uterus:  normal size, contour, position, consistency, mobility, non-tender              Adnexa: no mass, fullness, tenderness              Rectal exam: Yes.  .  Confirms.              Anus:  normal sphincter tone, no lesions  Chaperone was present for exam.  ASSESSMENT  Postmenopausal bleeding.  Hx PE.  On Elaquis.  Chronic back pain. Herniated disc.  Takes hydrocodone.  Large left renal stone.  Current UTI with E Coli.  PLAN  Discussed postmenopausal bleeding and potential etiologies.  Pap and reflex to HPV if ASCUS.  Needs pelvic ultrasound and possible EMB.  Will not stop Eliquis until certain that an EMB is needed.   An After Visit Summary was printed and given to the patient.  _30_____ minutes face to face time of which over 50% was spent in counseling.

## 2017-05-28 ENCOUNTER — Ambulatory Visit (HOSPITAL_COMMUNITY)
Admission: RE | Admit: 2017-05-28 | Discharge: 2017-05-28 | Disposition: A | Discharge status: Home / Self Care | Payer: Medicare Other | Source: Ambulatory Visit | Attending: Urology | Admitting: Urology

## 2017-05-28 DIAGNOSIS — N281 Cyst of kidney, acquired: Secondary | ICD-10-CM | POA: Diagnosis not present

## 2017-05-28 DIAGNOSIS — N1330 Unspecified hydronephrosis: Secondary | ICD-10-CM | POA: Diagnosis not present

## 2017-05-28 DIAGNOSIS — N200 Calculus of kidney: Secondary | ICD-10-CM | POA: Insufficient documentation

## 2017-05-29 LAB — CYTOLOGY - PAP: Diagnosis: HIGH — AB

## 2017-05-30 ENCOUNTER — Telehealth: Payer: Self-pay | Admitting: Obstetrics and Gynecology

## 2017-05-30 NOTE — Telephone Encounter (Signed)
Spoke with Peter Congo at [facility], calling with abnormal pap results dated 05/27/17, available for review in EPIC.   HGSIL: CIN-2/ CIN-3/CIS  Forwarding to Dr. Quincy Simmonds for review.

## 2017-05-30 NOTE — Telephone Encounter (Signed)
Peter Congo from [facility] called with a call report on an abnormal pap smear for this patient. She requested a call back at: [phone].

## 2017-05-31 NOTE — Telephone Encounter (Signed)
Please see result note I sent through to Triage.

## 2017-06-03 NOTE — Telephone Encounter (Signed)
-----   Message from Nunzio Cobbs, MD sent at 05/31/2017  3:23 PM EDT ----- Please inform patient of her abnormal pap smear showing high grade squamous dysplasia.  We will need to plan for a colposcopy for further evaluation.   She is returning for a pelvic ultrasound for postmenopausal bleeding.   I would like to schedule the colposcopy after the ultrasound is done so I know if I need to do an endometrial biopsy at the same time.  She is on Eliquis and will need to stop her medication in preparation for this.  I am not ready to advise her to stop this as we do not know what her full biopsy needs will be.  Please place her in 08 recall for November so we can follow her closely.   Cc- Lamont Snowball

## 2017-06-03 NOTE — Telephone Encounter (Signed)
2 nd call to preferred number. Voice mail states "[name]." left message to call back and speak to triage nurse.  Calling to review pap results.  Would like to move ultrasound appointment up to 06-06-17 and then schedule colpo.   08 recall entered for 06-27-17.

## 2017-06-03 NOTE — Telephone Encounter (Signed)
Call to patient. Left message on cell number listed on DPR , [phone]. Voice mail message indicates "Janice Morrow" and DPR indicates can leave message for brother [name]. Attempted alternate listed cell, no answer and no voice mail.

## 2017-06-04 NOTE — Telephone Encounter (Signed)
Call placed to cell number listed on [place],  [phone]. Voicemail message indicates "[name]". Patients DPR indicates can leave a message for brother [name]. Left voicemail message requesting a return call. Upon return call would like to  discuss moving 06/13/17 ultrasound to earlier appointment with Dr Quincy Simmonds on 06/06/17.    cc: Lamont Snowball, RN

## 2017-06-06 ENCOUNTER — Other Ambulatory Visit: Payer: Self-pay | Admitting: Obstetrics and Gynecology

## 2017-06-06 ENCOUNTER — Telehealth: Payer: Self-pay | Admitting: Internal Medicine

## 2017-06-06 ENCOUNTER — Other Ambulatory Visit: Payer: Self-pay

## 2017-06-06 NOTE — Telephone Encounter (Signed)
Coni from [facility] states she needs a surgical clearance on the patient. She is faxing over the form again. I informed Dr.Jones is out of office until Oct 15. She is requesting is another PCP can sign off, so that they can get the surgery scheduled.   Left Coni a VM to inform we would see if another PCP would sign off. If not she would have to wait until PCP is back in office. If she had any question to please call.

## 2017-06-06 NOTE — Progress Notes (Deleted)
Subjective:   Janice Morrow is a 71 y.o. female who presents for an Initial Medicare Annual Wellness Visit.  Review of Systems    No ROS.  Medicare Wellness Visit. Additional risk factors are reflected in the social history.     Sleep patterns: {SX; SLEEP PATTERNS:18802::"feels rested on waking","does not get up to void","gets up *** times nightly to void","sleeps *** hours nightly"}.    Home Safety/Smoke Alarms: Feels safe in home. Smoke alarms in place.  Living environment; residence and Firearm Safety: {Rehab home environment / accessibility:30080::"no firearms","firearms stored safely"}. Seat Belt Safety/Bike Helmet: Wears seat belt.     Objective:    There were no vitals filed for this visit. There is no height or weight on file to calculate BMI.   Current Medications (verified) Outpatient Encounter Prescriptions as of 06/07/2017  Medication Sig  . apixaban (ELIQUIS) 5 MG TABS tablet Take 1 tablet (5 mg total) by mouth 2 (two) times daily. (Patient taking differently: Take 10 mg by mouth daily. )  . carvedilol (COREG) 3.125 MG tablet Take 1 tablet (3.125 mg total) by mouth 2 (two) times daily with a meal.  . diclofenac sodium (VOLTAREN) 1 % GEL APPLY 4 GRAMS 3 TIMES A DAY AS NEEDED  . furosemide (LASIX) 40 MG tablet TAKE 1 TABLET (40 MG TOTAL) BY MOUTH DAILY.  [name] gabapentin (NEURONTIN) 400 MG capsule Take 400-1,200 mg by mouth See admin instructions. TAKE 1 CAPSULE AT LUNCH, TAKE 2 CAPSULES AT SUPPER AND 3 CAPSULES AT BEDTIME   . HYDROcodone-acetaminophen (NORCO) 10-325 MG tablet Take 1 tablet by mouth every 6 (six) hours as needed. (Patient taking differently: Take 1 tablet by mouth every 6 (six) hours as needed for moderate pain. )  . umeclidinium-vilanterol (ANORO ELLIPTA) 62.5-25 MCG/INH AEPB Inhale 1 puff into the lungs daily.   No facility-administered encounter medications on file as of 06/07/2017.     Allergies (verified) Baclofen and Naproxen   History: Past  Medical History:  Diagnosis Date  . Acute diastolic CHF (congestive heart failure) ([place]) 08/24/2015  . Cervicalgia   . Chronic back pain   . COPD (chronic obstructive pulmonary disease) ([place])   . Essential hypertension 08/24/2015  . GERD (gastroesophageal reflux disease)   . Headache(784.0)   . Heart murmur   . Hyperlipidemia   . Migraine without aura, with intractable migraine, so stated, without mention of status migrainosus   . OSA on CPAP   . Osteoarthritis   . PVD (peripheral vascular disease) ([place])   . PVD (peripheral vascular disease) ([place])   . Seizures ([place]) 07/2014, 10/2014  . Shortness of breath dyspnea   . Trigeminal neuralgia    Past Surgical History:  Procedure Laterality Date  . MULTIPLE TOOTH EXTRACTIONS     "they took out part of my teeth; I took out the rest when they got loose; was suppose to get dentures; never did"   Family History  Problem Relation Age of Onset  . Heart attack Father   . Diabetes Mother   . Dementia Mother   . Diabetes Brother   . Heart disease Unknown   . Diabetes Unknown    Social History   Occupational History  . DISABLED Unemployed   Social History Main Topics  . Smoking status: Former Smoker    Types: Cigarettes    Quit date: 08/22/2010  . Smokeless tobacco: Never Used  . Alcohol use No  . Drug use: No  . Sexual activity: No    Tobacco  Counseling Counseling given: Not Answered   Activities of Daily Living In your present state of health, do you have any difficulty performing the following activities: 01/16/2017 12/01/2016  Hearing? N N  Vision? N N  Difficulty concentrating or making decisions? N N  Walking or climbing stairs? N Y  Dressing or bathing? N N  Doing errands, shopping? N N  Some recent data might be hidden    Immunizations and Health Maintenance Immunization History  Administered Date(s) Administered  . Pneumococcal Conjugate-13 07/31/2016  . Pneumococcal Polysaccharide-23 08/16/2010, 10/30/2014    . Tdap 11/25/2014   Health Maintenance Due  Topic Date Due  . Hepatitis C Screening  [birth year]  . OPHTHALMOLOGY EXAM  06/13/1958  . DEXA SCAN  06/13/2013  . MAMMOGRAM  03/17/2017    Patient Care Team: Janice Lima, MD as PCP - General (Internal Medicine)  Indicate any recent Medical Services you may have received from other than [facility] providers in the past year (date may be approximate).     Assessment:   This is a routine wellness examination for Janice Morrow.Physical assessment deferred to PCP.   Hearing/Vision screen No exam data present  Dietary issues and exercise activities discussed:   Diet (meal preparation, eat out, water intake, caffeinated beverages, dairy products, fruits and vegetables): {Desc; diets:16563}     Goals    None     Depression Screen PHQ 2/9 Scores 05/20/2017 03/08/2015  PHQ - 2 Score 0 0    Fall Risk Fall Risk  05/20/2017 03/08/2015  Falls in the past year? No Yes  Injury with Fall? - No    Cognitive Function:        Screening Tests Health Maintenance  Topic Date Due  . Hepatitis C Screening  1946-03-17  . OPHTHALMOLOGY EXAM  06/13/1958  . DEXA SCAN  06/13/2013  . MAMMOGRAM  03/17/2017  . INFLUENZA VACCINE  11/25/2017 (Originally 03/27/2017)  . HEMOGLOBIN A1C  11/17/2017  . FOOT EXAM  05/20/2018  . URINE MICROALBUMIN  05/20/2018  . COLONOSCOPY  10/20/2020  . TETANUS/TDAP  11/24/2024  . PNA vac Low Risk Adult  Completed      Plan:    I have personally reviewed and noted the following in the patient's chart:   . Medical and social history . Use of alcohol, tobacco or illicit drugs  . Current medications and supplements . Functional ability and status . Nutritional status . Physical activity . Advanced directives . List of other physicians . Vitals . Screenings to include cognitive, depression, and falls . Referrals and appointments  In addition, I have reviewed and discussed with patient certain preventive protocols,  quality metrics, and best practice recommendations. A written personalized care plan for preventive services as well as general preventive health recommendations were provided to patient.     Michiel Cowboy, RN   06/06/2017

## 2017-06-06 NOTE — Telephone Encounter (Signed)
Call to patient. Main number listed has voice mail recording of "Kushi Kun." Secondary number appears to be out of service with rapid busy signal. Left message on [name]'s voice mail (per DPR) to have patient call Dr Elza Rafter office.  Per note in [facility] called PCP office today requesting surgical clearance. Call to [facility], unable to get through (weather has caused phone issues.)  Patient is scheduled for PUS in office on 06-13-17. Was unable to reach her to move up appointment.   Routing to Dr Quincy Simmonds for review.

## 2017-06-06 NOTE — Progress Notes (Deleted)
Pre visit review using our clinic review tool, if applicable. No additional management support is needed unless otherwise documented below in the visit note. 

## 2017-06-07 ENCOUNTER — Ambulatory Visit: Payer: Medicare Other

## 2017-06-07 NOTE — Telephone Encounter (Signed)
Pt is too complicated to have another provider sign off.

## 2017-06-11 NOTE — Telephone Encounter (Signed)
Will close encounter

## 2017-06-11 NOTE — Telephone Encounter (Signed)
We will keep her appointment as scheduled for 06/13/17.

## 2017-06-12 ENCOUNTER — Telehealth: Payer: Self-pay | Admitting: Obstetrics and Gynecology

## 2017-06-12 ENCOUNTER — Encounter: Payer: Self-pay | Admitting: Obstetrics and Gynecology

## 2017-06-12 NOTE — Telephone Encounter (Signed)
Please contact patient in follow up to her cancellation of the pelvic ultrasound for postmenopausal bleeding. She also has high grade dysplasia on her pap smear which needs evaluation.  She is not aware of the pap results yet, because we have been unable to reach her.   I think she needs a 30 day letter regarding the need to complete the evaluation for the postmenopausal bleeding and the need for colposcopy due to the high grade dysplasia.   Cc- Thora Lance

## 2017-06-12 NOTE — Telephone Encounter (Signed)
Patient canceled her PUS appointment 06/13/17. I reminded patient of our procedure  [facility] policy. Patient said she will not have transportation to get here. I asked patient if she would like to reschedule. Patient stated she would call back to reschedule. [facility] policy followed.

## 2017-06-13 ENCOUNTER — Other Ambulatory Visit: Payer: Self-pay | Admitting: Obstetrics and Gynecology

## 2017-06-13 ENCOUNTER — Other Ambulatory Visit: Payer: Self-pay

## 2017-06-13 NOTE — Telephone Encounter (Signed)
Greg Cutter [phone]  Ext [phone] Fax Number [phone]  Called asking for the surgical clearance form to be sent back  Please advise

## 2017-06-13 NOTE — Telephone Encounter (Signed)
Surgical clearance refaxed.

## 2017-06-18 ENCOUNTER — Other Ambulatory Visit: Payer: Self-pay | Admitting: Urology

## 2017-06-18 DIAGNOSIS — G894 Chronic pain syndrome: Secondary | ICD-10-CM | POA: Diagnosis not present

## 2017-06-18 DIAGNOSIS — M4696 Unspecified inflammatory spondylopathy, lumbar region: Secondary | ICD-10-CM | POA: Diagnosis not present

## 2017-06-18 DIAGNOSIS — M47817 Spondylosis without myelopathy or radiculopathy, lumbosacral region: Secondary | ICD-10-CM | POA: Diagnosis not present

## 2017-06-18 DIAGNOSIS — M5137 Other intervertebral disc degeneration, lumbosacral region: Secondary | ICD-10-CM | POA: Diagnosis not present

## 2017-06-19 ENCOUNTER — Ambulatory Visit: Payer: Medicare Other

## 2017-06-20 ENCOUNTER — Encounter: Payer: Self-pay | Admitting: *Deleted

## 2017-06-20 NOTE — Telephone Encounter (Signed)
Call to patient on number listed on DPR, Voice mail message confirms "Janice Morrow."  Left message that this is Gay Filler from Dr Elza Rafter office calling with test results and important that I speak with [name] and have been unable to reach her. Requested call back at earliest convenience.

## 2017-06-20 NOTE — Progress Notes (Deleted)
Pre visit review using our clinic review tool, if applicable. No additional management support is needed unless otherwise documented below in the visit note. 

## 2017-06-20 NOTE — Progress Notes (Deleted)
Subjective:   JOBY HERSHKOWITZ is a 71 y.o. female who presents for an Initial Medicare Annual Wellness Visit.  Review of Systems    No ROS.  Medicare Wellness Visit. Additional risk factors are reflected in the social history.     Sleep patterns: {SX; SLEEP PATTERNS:18802::"feels rested on waking","does not get up to void","gets up *** times nightly to void","sleeps *** hours nightly"}.    Home Safety/Smoke Alarms: Feels safe in home. Smoke alarms in place.  Living environment; residence and Firearm Safety: {Rehab home environment / accessibility:30080::"no firearms","firearms stored safely"}. Seat Belt Safety/Bike Helmet: Wears seat belt.     Objective:    There were no vitals filed for this visit. There is no height or weight on file to calculate BMI.   Current Medications (verified) Outpatient Encounter Prescriptions as of 06/21/2017  Medication Sig  . apixaban (ELIQUIS) 5 MG TABS tablet Take 1 tablet (5 mg total) by mouth 2 (two) times daily. (Patient taking differently: Take 10 mg by mouth daily. )  . carvedilol (COREG) 3.125 MG tablet Take 1 tablet (3.125 mg total) by mouth 2 (two) times daily with a meal.  . diclofenac sodium (VOLTAREN) 1 % GEL APPLY 4 GRAMS 3 TIMES A DAY AS NEEDED  . furosemide (LASIX) 40 MG tablet TAKE 1 TABLET (40 MG TOTAL) BY MOUTH DAILY.  [name] gabapentin (NEURONTIN) 400 MG capsule Take 400-1,200 mg by mouth See admin instructions. TAKE 1 CAPSULE AT LUNCH, TAKE 2 CAPSULES AT SUPPER AND 3 CAPSULES AT BEDTIME   . HYDROcodone-acetaminophen (NORCO) 10-325 MG tablet Take 1 tablet by mouth every 6 (six) hours as needed. (Patient taking differently: Take 1 tablet by mouth every 6 (six) hours as needed for moderate pain. )  . umeclidinium-vilanterol (ANORO ELLIPTA) 62.5-25 MCG/INH AEPB Inhale 1 puff into the lungs daily.   No facility-administered encounter medications on file as of 06/21/2017.     Allergies (verified) Baclofen and Naproxen   History: Past  Medical History:  Diagnosis Date  . Acute diastolic CHF (congestive heart failure) ([place]) 08/24/2015  . Cervicalgia   . Chronic back pain   . COPD (chronic obstructive pulmonary disease) ([place])   . Essential hypertension 08/24/2015  . GERD (gastroesophageal reflux disease)   . Headache(784.0)   . Heart murmur   . Hyperlipidemia   . Migraine without aura, with intractable migraine, so stated, without mention of status migrainosus   . OSA on CPAP   . Osteoarthritis   . PVD (peripheral vascular disease) ([place])   . PVD (peripheral vascular disease) ([place])   . Seizures ([place]) 07/2014, 10/2014  . Shortness of breath dyspnea   . Trigeminal neuralgia    Past Surgical History:  Procedure Laterality Date  . MULTIPLE TOOTH EXTRACTIONS     "they took out part of my teeth; I took out the rest when they got loose; was suppose to get dentures; never did"   Family History  Problem Relation Age of Onset  . Heart attack Father   . Diabetes Mother   . Dementia Mother   . Diabetes Brother   . Heart disease Unknown   . Diabetes Unknown    Social History   Occupational History  . DISABLED Unemployed   Social History Main Topics  . Smoking status: Former Smoker    Types: Cigarettes    Quit date: 08/22/2010  . Smokeless tobacco: Never Used  . Alcohol use No  . Drug use: No  . Sexual activity: No    Tobacco  Counseling Counseling given: Not Answered   Activities of Daily Living In your present state of health, do you have any difficulty performing the following activities: 01/16/2017 12/01/2016  Hearing? N N  Vision? N N  Difficulty concentrating or making decisions? N N  Walking or climbing stairs? N Y  Dressing or bathing? N N  Doing errands, shopping? N N  Some recent data might be hidden    Immunizations and Health Maintenance Immunization History  Administered Date(s) Administered  . Pneumococcal Conjugate-13 07/31/2016  . Pneumococcal Polysaccharide-23 08/16/2010, 10/30/2014    . Tdap 11/25/2014   Health Maintenance Due  Topic Date Due  . Hepatitis C Screening  09/14/1945  . OPHTHALMOLOGY EXAM  06/13/1958  . DEXA SCAN  06/13/2013  . MAMMOGRAM  03/17/2017    Patient Care Team: Janith Lima, MD as PCP - General (Internal Medicine)  Indicate any recent Medical Services you may have received from other than [facility] providers in the past year (date may be approximate).     Assessment:   This is a routine wellness examination for [place]. Physical assessment deferred to PCP.   Hearing/Vision screen No exam data present  Dietary issues and exercise activities discussed:   Diet (meal preparation, eat out, water intake, caffeinated beverages, dairy products, fruits and vegetables): {Desc; diets:16563}   Goals    None     Depression Screen PHQ 2/9 Scores 05/20/2017 03/08/2015  PHQ - 2 Score 0 0    Fall Risk Fall Risk  05/20/2017 03/08/2015  Falls in the past year? No Yes  Injury with Fall? - No    Cognitive Function:        Screening Tests Health Maintenance  Topic Date Due  . Hepatitis C Screening  Dec 30, 1945  . OPHTHALMOLOGY EXAM  06/13/1958  . DEXA SCAN  06/13/2013  . MAMMOGRAM  03/17/2017  . INFLUENZA VACCINE  11/25/2017 (Originally 03/27/2017)  . HEMOGLOBIN A1C  11/17/2017  . FOOT EXAM  05/20/2018  . URINE MICROALBUMIN  05/20/2018  . COLONOSCOPY  10/20/2020  . TETANUS/TDAP  11/24/2024  . PNA vac Low Risk Adult  Completed      Plan:    I have personally reviewed and noted the following in the patient's chart:   . Medical and social history . Use of alcohol, tobacco or illicit drugs  . Current medications and supplements . Functional ability and status . Nutritional status . Physical activity . Advanced directives . List of other physicians . Vitals . Screenings to include cognitive, depression, and falls . Referrals and appointments  In addition, I have reviewed and discussed with patient certain preventive protocols,  quality metrics, and best practice recommendations. A written personalized care plan for preventive services as well as general preventive health recommendations were provided to patient.     Michiel Cowboy, RN   06/20/2017

## 2017-06-21 ENCOUNTER — Ambulatory Visit: Payer: Medicare Other

## 2017-06-21 ENCOUNTER — Encounter: Payer: Self-pay | Admitting: *Deleted

## 2017-06-21 NOTE — Telephone Encounter (Signed)
Letter mailed certified and regular [place] mail.   Encounter closed.

## 2017-06-21 NOTE — Telephone Encounter (Signed)
Letter to your desk for review.   Patient was scheduled for PCP appointment on 06-19-17 and 06-21-17 and has cancelled both appointments. She is rescheduled for Monday, 06-24-17.

## 2017-06-21 NOTE — Progress Notes (Signed)
Pre visit review using our clinic review tool, if applicable. No additional management support is needed unless otherwise documented below in the visit note. 

## 2017-06-21 NOTE — Telephone Encounter (Signed)
Letter signed and returned to you.  

## 2017-06-21 NOTE — Progress Notes (Addendum)
Subjective:   Janice Morrow is a 71 y.o. female who presents for an Initial Medicare Annual Wellness Visit.  Review of Systems    No ROS.  Medicare Wellness Visit. Additional risk factors are reflected in the social history. Patient is morbidly obese. She has very limited mobility, she needs assistance to walk and can only ambulate several steps at a time. She has limited support, lives with her brother who is disabled due to back issues and is unable to offer much assistance to the patient.   Cardiac Risk Factors include: advanced age (>80men, >27 women);diabetes mellitus;dyslipidemia;hypertension;obesity (BMI >30kg/m2);sedentary lifestyle Sleep patterns: gets up 2-3 times nightly to void and sleeps 5-6 hours nightly. Patient reports insomnia issues, discussed recommended sleep tips.   Home Safety/Smoke Alarms: Feels safe in home. Smoke alarms in place.  Living environment; residence and Firearm Safety: 1-story house/ trailer, no firearms. Lives with brother who has limited ability to assist patient. Patient states that she needs an electric wheelchair to assist with her ADL and independence as she does not have the upper body strength to push herself in a manual wheelchair.  Seat Belt Safety/Bike Helmet: Wears seat belt.     Objective:    Today's Vitals   06/24/17 1419 06/24/17 1425  BP: 134/68   Pulse: 85   Resp: 20   SpO2: 95%   Weight: (!) 349 lb (158.3 kg)   Height: 5\' 5"  (1.651 m)   PainSc:  4    Body mass index is 58.08 kg/m.   Current Medications (verified) Outpatient Encounter Prescriptions as of 06/24/2017  Medication Sig  . apixaban (ELIQUIS) 5 MG TABS tablet Take 1 tablet (5 mg total) by mouth 2 (two) times daily. (Patient taking differently: Take 10 mg by mouth daily. )  . carvedilol (COREG) 3.125 MG tablet Take 1 tablet (3.125 mg total) by mouth 2 (two) times daily with a meal.  . diclofenac sodium (VOLTAREN) 1 % GEL APPLY 4 GRAMS 3 TIMES A DAY AS NEEDED  .  furosemide (LASIX) 40 MG tablet TAKE 1 TABLET (40 MG TOTAL) BY MOUTH DAILY.  [name] gabapentin (NEURONTIN) 400 MG capsule Take 400-1,200 mg by mouth See admin instructions. TAKE 1 CAPSULE AT LUNCH, TAKE 2 CAPSULES AT SUPPER AND 3 CAPSULES AT BEDTIME   . HYDROcodone-acetaminophen (NORCO) 10-325 MG tablet Take 1 tablet by mouth every 6 (six) hours as needed. (Patient taking differently: Take 1 tablet by mouth every 6 (six) hours as needed for moderate pain. )  . umeclidinium-vilanterol (ANORO ELLIPTA) 62.5-25 MCG/INH AEPB Inhale 1 puff into the lungs daily.   No facility-administered encounter medications on file as of 06/24/2017.     Allergies (verified) Baclofen and Naproxen   History: Past Medical History:  Diagnosis Date  . Acute diastolic CHF (congestive heart failure) ([place]) 08/24/2015  . Cervicalgia   . Chronic back pain   . COPD (chronic obstructive pulmonary disease) ([place])   . Essential hypertension 08/24/2015  . GERD (gastroesophageal reflux disease)   . Headache(784.0)   . Heart murmur   . Hyperlipidemia   . Migraine without aura, with intractable migraine, so stated, without mention of status migrainosus   . OSA on CPAP   . Osteoarthritis   . PVD (peripheral vascular disease) ([place])   . PVD (peripheral vascular disease) ([place])   . Seizures ([place]) 07/2014, 10/2014  . Shortness of breath dyspnea   . Trigeminal neuralgia    Past Surgical History:  Procedure Laterality Date  . MULTIPLE TOOTH  EXTRACTIONS     "they took out part of my teeth; I took out the rest when they got loose; was suppose to get dentures; never did"   Family History  Problem Relation Age of Onset  . Heart attack Father   . Diabetes Mother   . Dementia Mother   . Diabetes Brother   . Heart disease Unknown   . Diabetes Unknown    Social History   Occupational History  . DISABLED Unemployed   Social History Main Topics  . Smoking status: Former Smoker    Types: Cigarettes    Quit date: 08/22/2010  .  Smokeless tobacco: Never Used  . Alcohol use No  . Drug use: No  . Sexual activity: No    Tobacco Counseling Counseling given: Not Answered   Activities of Daily Living In your present state of health, do you have any difficulty performing the following activities: 06/24/2017 01/16/2017  Hearing? N N  Vision? N N  Difficulty concentrating or making decisions? N N  Walking or climbing stairs? Y N  Dressing or bathing? Y N  Doing errands, shopping? Y N  Preparing Food and eating ? Y -  Using the Toilet? N -  In the past six months, have you accidently leaked urine? Y -  Comment incontinent due to diuretics -  Do you have problems with loss of bowel control? N -  Managing your Medications? N -  Managing your Finances? N -  Housekeeping or managing your Housekeeping? Y -  Some recent data might be hidden    Immunizations and Health Maintenance Immunization History  Administered Date(s) Administered  . Pneumococcal Conjugate-13 07/31/2016  . Pneumococcal Polysaccharide-23 08/16/2010, 10/30/2014  . Tdap 11/25/2014   Health Maintenance Due  Topic Date Due  . Hepatitis C Screening  1946-07-25  . OPHTHALMOLOGY EXAM  06/13/1958  . DEXA SCAN  06/13/2013  . MAMMOGRAM  03/17/2017    Patient Care Team: Janith Lima, MD as PCP - General (Internal Medicine) Festus Aloe, MD as Consulting Physician (Urology)  Indicate any recent Medical Services you may have received from other than [facility] providers in the past year (date may be approximate).     Assessment:   This is a routine wellness examination for [place].Physical assessment deferred to PCP.   Hearing/Vision screen  Visual Acuity Screening   Right eye Left eye Both eyes  Without correction: 20/40 20/50 20/40   With correction:     Comments:    Hearing Screening Comments: Able to hear conversational tones w/o difficulty. No issues reported.  Passed whisper test   Dietary issues and exercise activities  discussed: Current Exercise Habits: The patient does not participate in regular exercise at present, Exercise limited by: orthopedic condition(s) (morbid obesity)  Diet (meal preparation, eat out, water intake, caffeinated beverages, dairy products, fruits and vegetables): in general, an "unhealthy" diet, patient states she is physically not able to prepare meals and has difficulty to be able to afford food.   Helped patient with application process for Meals on Wheels. Discussed Reviewed heart healthy and diabetic diet, encouraged patient to increase daily water intake.  Goals    . Be as active and as independent as possible      Depression Screen PHQ 2/9 Scores 06/24/2017 05/20/2017 03/08/2015  PHQ - 2 Score 4 0 0  PHQ- 9 Score 9 - -    Fall Risk Fall Risk  06/24/2017 05/20/2017 03/08/2015  Falls in the past year? No No Yes  Injury  with Fall? - - No  Risk for fall due to : Impaired mobility;Impaired balance/gait - -    Cognitive Function: MMSE - Mini Mental State Exam 06/24/2017  Orientation to time 5  Orientation to Place 5  Registration 3  Attention/ Calculation 3  Recall 2  Language- name 2 objects 2  Language- repeat 1  Language- follow 3 step command 3  Language- read & follow direction 1  Write a sentence 1  Copy design 1  Total score 27        Screening Tests Health Maintenance  Topic Date Due  . Hepatitis C Screening  1945-12-22  . OPHTHALMOLOGY EXAM  06/13/1958  . DEXA SCAN  06/13/2013  . MAMMOGRAM  03/17/2017  . INFLUENZA VACCINE  11/25/2017 (Originally 03/27/2017)  . HEMOGLOBIN A1C  11/17/2017  . FOOT EXAM  05/20/2018  . URINE MICROALBUMIN  05/20/2018  . COLONOSCOPY  10/20/2020  . TETANUS/TDAP  11/24/2024  . PNA vac Low Risk Adult  Completed      Plan:     Will inform PCP of need for electric wheelchair to begin ordering process.   Started [place] application to obtain transportation assistance for patient and finished application for Meals on Wheels  for patient.  Provided resources to patient for housing assistance and will send referral to [facility] for SW evaluation.   Continue doing brain stimulating activities (puzzles, reading, adult coloring books, staying active) to keep memory sharp.   I have personally reviewed and noted the following in the patient's chart:   . Medical and social history . Use of alcohol, tobacco or illicit drugs  . Current medications and supplements . Functional ability and status . Nutritional status . Physical activity . Advanced directives . List of other physicians . Vitals . Screenings to include cognitive, depression, and falls . Referrals and appointments  In addition, I have reviewed and discussed with patient certain preventive protocols, quality metrics, and best practice recommendations. A written personalized care plan for preventive services as well as general preventive health recommendations were provided to patient.     Michiel Cowboy, RN   06/24/2017   Medical screening examination/treatment/procedure(s) were performed by non-physician practitioner and as supervising physician I was immediately available for consultation/collaboration. I agree with above. [name], MD

## 2017-06-24 ENCOUNTER — Ambulatory Visit (INDEPENDENT_AMBULATORY_CARE_PROVIDER_SITE_OTHER): Payer: Medicare Other | Admitting: *Deleted

## 2017-06-24 ENCOUNTER — Telehealth: Payer: Self-pay | Admitting: *Deleted

## 2017-06-24 VITALS — BP 134/68 | HR 85 | Resp 20 | Ht 65.0 in | Wt 349.0 lb

## 2017-06-24 DIAGNOSIS — M5442 Lumbago with sciatica, left side: Secondary | ICD-10-CM

## 2017-06-24 DIAGNOSIS — E118 Type 2 diabetes mellitus with unspecified complications: Secondary | ICD-10-CM

## 2017-06-24 DIAGNOSIS — J449 Chronic obstructive pulmonary disease, unspecified: Secondary | ICD-10-CM

## 2017-06-24 DIAGNOSIS — G8929 Other chronic pain: Secondary | ICD-10-CM | POA: Diagnosis not present

## 2017-06-24 DIAGNOSIS — M5441 Lumbago with sciatica, right side: Secondary | ICD-10-CM

## 2017-06-24 DIAGNOSIS — Z0000 Encounter for general adult medical examination without abnormal findings: Secondary | ICD-10-CM

## 2017-06-24 DIAGNOSIS — I10 Essential (primary) hypertension: Secondary | ICD-10-CM

## 2017-06-24 NOTE — Telephone Encounter (Signed)
During AWV, patient stated that she needs an IT trainer wheelchair. She is unable to stand or ambulate without assistance and is very limited in the distance she can travel. She is unable to wheel a manual chair because she does not have the upper arm strength and she has very limited support.

## 2017-06-24 NOTE — Patient Instructions (Addendum)
Continue doing brain stimulating activities (puzzles, reading, adult coloring books, staying active) to keep memory sharp.   Janice Morrow , Thank you for taking time to come for your Medicare Wellness Visit. I appreciate your ongoing commitment to your health goals. Please review the following plan we discussed and let me know if I can assist you in the future.   These are the goals we discussed: Goals    . Be as active and as independent as possible       This is a list of the screening recommended for you and due dates:  Health Maintenance  Topic Date Due  .  Hepatitis C: One time screening is recommended by Center for Disease Control  (CDC) for  adults born from [birth year] through 1963.   1945/09/30  . Eye exam for diabetics  06/13/1958  . DEXA scan (bone density measurement)  06/13/2013  . Mammogram  03/17/2017  . Flu Shot  11/25/2017*  . Hemoglobin A1C  11/17/2017  . Complete foot exam   05/20/2018  . Urine Protein Check  05/20/2018  . Colon Cancer Screening  10/20/2020  . Tetanus Vaccine  11/24/2024  . Pneumonia vaccines  Completed  *Topic was postponed. The date shown is not the original due date.

## 2017-06-25 NOTE — Telephone Encounter (Signed)
stef?

## 2017-06-26 ENCOUNTER — Other Ambulatory Visit: Payer: Self-pay

## 2017-06-26 NOTE — Telephone Encounter (Addendum)
Called and spoke to patient who stated that she no longer wanted an electric wheelchair, explaining she will have assistance to push the wheelchair and requested a manual extra large chair. She also states that she needs a walker with a seat, one that would fit her size. She was notified that [facility] would contact her and she stated that she will receive their call and help. Nurse will send patient the SCAT application to sign and complete with an extra envelope and stamp for her to return.  Dr. Ronnald Ramp will complete section B, from there the application will be faxed to [employer] to begin processing.

## 2017-06-26 NOTE — Addendum Note (Signed)
Addended by: Aviva Signs M on: 06/26/2017 03:56 PM   Modules accepted: Orders

## 2017-06-26 NOTE — Telephone Encounter (Signed)
Form for SCAT is complete and given to PCP to sign.

## 2017-06-26 NOTE — Patient Outreach (Signed)
[facility]) Care Management  06/26/2017  Janice Morrow Nov 12, 1945 [id number]  1st outreach  call to patient for screening.  No answer. HIPAA compliant voice message left,   Plan: [facility] will make outreach attempt to patient within three business days.   Lazaro Arms RN, BSN, [place] Direct Dial:  [phone] Fax: [phone]

## 2017-06-26 NOTE — Telephone Encounter (Signed)
Called and LVM for patient to call nurse in regards to social resource needs for the patient that were discussed during her AWV. Nurse left contact number and will continue to reach out to patient to assist with her needs.

## 2017-06-28 NOTE — Telephone Encounter (Signed)
Called and informed the patient that Janice Morrow, nurse from [facility] was trying to contact Janice Morrow. Nurse Janice Morrow' contact number was given to the patient and the patient stated she would contact the [facility] nurse. Also discussed that the electric wheelchair would give the patient much more independence. She would not have to to depend on individuals to push her. Patient agreed and admitted that she was intimidated about operating the electric wheelchair but felt she could with practiced and that it would absolutely give her much more independence and freedom. Patient was informed that the SCAT application had been mailed to her. She stated she would complete the application and return it to nurse as quickly as possible. Educated patient she may also qualify for assisted living through her Medicare/ [employer] coverage and discussed that this would be a good conversation for her to begin with the [facility]. Patient states she will call nurse if she has any further questions or concerns.

## 2017-07-04 NOTE — Progress Notes (Signed)
02-20-17 (EPIC) EKG, CXR  12-11-16 (EPIC) ECHO

## 2017-07-04 NOTE — Patient Instructions (Signed)
Janice Morrow  07/04/2017   Your procedure is scheduled on: 07-09-17  Report to [facility] Main  Entrance Take [place]  Elevators to 3rd floor to  [place] at 5:30 AM.   Call this number if you have problems the morning of surgery [phone]    Remember: ONLY 1 PERSON MAY GO WITH YOU TO SHORT STAY TO GET  READY MORNING OF [place].  Do not eat food or drink liquids :After Midnight.     Take these medicines the morning of surgery with A SIP OF WATER: Carvedilol (Coreg). You may also bring and use your inhaler.                                You may not have any metal on your body including hair pins and              piercings  Do not wear jewelry, make-up, lotions, powders or perfumes, deodorant             Do not wear nail polish.  Do not shave  48 hours prior to surgery.           Do not bring valuables to the hospital. [place].  Contacts, dentures or bridgework may not be worn into surgery.  Leave suitcase in the car. After surgery it may be brought to your room.     Patients discharged the day of surgery will not be allowed to drive home.  Name and phone number of your driver:  Special Instructions: N/A              Please read over the following fact sheets you were given: _____________________________________________________________________             [facility] - Preparing for Surgery Before surgery, you can play an important role.  Because skin is not sterile, your skin needs to be as free of germs as possible.  You can reduce the number of germs on your skin by washing with CHG (chlorahexidine gluconate) soap before surgery.  CHG is an antiseptic cleaner which kills germs and bonds with the skin to continue killing germs even after washing. Please DO NOT use if you have an allergy to CHG or antibacterial soaps.  If your skin becomes reddened/irritated stop using the CHG and inform  your nurse when you arrive at Short Stay. Do not shave (including legs and underarms) for at least 48 hours prior to the first CHG shower.  You may shave your face/neck. Please follow these instructions carefully:  1.  Shower with CHG Soap the night before surgery and the  morning of Surgery.  2.  If you choose to wash your hair, wash your hair first as usual with your  normal  shampoo.  3.  After you shampoo, rinse your hair and body thoroughly to remove the  shampoo.                           4.  Use CHG as you would any other liquid soap.  You can apply chg directly  to the skin and wash  Gently with a scrungie or clean washcloth.  5.  Apply the CHG Soap to your body ONLY FROM THE NECK DOWN.   Do not use on face/ open                           Wound or open sores. Avoid contact with eyes, ears mouth and genitals (private parts).                       Wash face,  Genitals (private parts) with your normal soap.             6.  Wash thoroughly, paying special attention to the area where your surgery  will be performed.  7.  Thoroughly rinse your body with warm water from the neck down.  8.  DO NOT shower/wash with your normal soap after using and rinsing off  the CHG Soap.                9.  Pat yourself dry with a clean towel.            10.  Wear clean pajamas.            11.  Place clean sheets on your bed the night of your first shower and do not  sleep with pets. Day of Surgery : Do not apply any lotions/deodorants the morning of surgery.  Please wear clean clothes to the hospital/surgery center.  FAILURE TO FOLLOW THESE INSTRUCTIONS MAY RESULT IN THE CANCELLATION OF YOUR SURGERY PATIENT SIGNATURE_________________________________  NURSE SIGNATURE__________________________________  ________________________________________________________________________

## 2017-07-05 ENCOUNTER — Encounter (HOSPITAL_COMMUNITY)
Admission: RE | Admit: 2017-07-05 | Discharge: 2017-07-05 | Disposition: A | Discharge status: Home / Self Care | Payer: Medicare Other | Source: Ambulatory Visit | Attending: Urology | Admitting: Urology

## 2017-07-08 ENCOUNTER — Encounter (HOSPITAL_COMMUNITY)
Admission: RE | Admit: 2017-07-08 | Discharge: 2017-07-08 | Disposition: A | Discharge status: Home / Self Care | Payer: Medicare Other | Source: Ambulatory Visit | Attending: Urology | Admitting: Urology

## 2017-07-08 ENCOUNTER — Other Ambulatory Visit: Payer: Self-pay

## 2017-07-08 ENCOUNTER — Encounter (HOSPITAL_COMMUNITY): Payer: Self-pay

## 2017-07-08 DIAGNOSIS — Z79899 Other long term (current) drug therapy: Secondary | ICD-10-CM | POA: Diagnosis not present

## 2017-07-08 DIAGNOSIS — N200 Calculus of kidney: Secondary | ICD-10-CM | POA: Diagnosis not present

## 2017-07-08 DIAGNOSIS — Z7901 Long term (current) use of anticoagulants: Secondary | ICD-10-CM | POA: Diagnosis not present

## 2017-07-08 DIAGNOSIS — Z6841 Body Mass Index (BMI) 40.0 and over, adult: Secondary | ICD-10-CM | POA: Diagnosis not present

## 2017-07-08 DIAGNOSIS — J449 Chronic obstructive pulmonary disease, unspecified: Secondary | ICD-10-CM | POA: Diagnosis not present

## 2017-07-08 DIAGNOSIS — G40909 Epilepsy, unspecified, not intractable, without status epilepticus: Secondary | ICD-10-CM | POA: Diagnosis not present

## 2017-07-08 DIAGNOSIS — Z86711 Personal history of pulmonary embolism: Secondary | ICD-10-CM | POA: Diagnosis not present

## 2017-07-08 DIAGNOSIS — E785 Hyperlipidemia, unspecified: Secondary | ICD-10-CM | POA: Diagnosis not present

## 2017-07-08 DIAGNOSIS — G500 Trigeminal neuralgia: Secondary | ICD-10-CM | POA: Diagnosis not present

## 2017-07-08 DIAGNOSIS — Z87891 Personal history of nicotine dependence: Secondary | ICD-10-CM | POA: Diagnosis not present

## 2017-07-08 DIAGNOSIS — I493 Ventricular premature depolarization: Secondary | ICD-10-CM | POA: Diagnosis not present

## 2017-07-08 LAB — BASIC METABOLIC PANEL
ANION GAP: 8 (ref 5–15)
BUN: 16 mg/dL (ref 6–20)
CHLORIDE: 100 mmol/L — AB (ref 101–111)
CO2: 26 mmol/L (ref 22–32)
CREATININE: 1.33 mg/dL — AB (ref 0.44–1.00)
Calcium: 9.5 mg/dL (ref 8.9–10.3)
GFR calc non Af Amer: 40 mL/min — ABNORMAL LOW (ref >60)
GFR, EST AFRICAN AMERICAN: 46 mL/min — AB (ref >60)
GLUCOSE: 128 mg/dL — AB (ref 65–99)
POTASSIUM: 4 mmol/L (ref 3.5–5.1)
SODIUM: 134 mmol/L — AB (ref 135–145)

## 2017-07-08 LAB — CBC
HEMATOCRIT: 46.7 % — AB (ref 36.0–46.0)
Hemoglobin: 15 g/dL (ref 12.0–15.0)
MCH: 28.1 pg (ref 26.0–34.0)
MCHC: 32.1 g/dL (ref 30.0–36.0)
MCV: 87.6 fL (ref 78.0–100.0)
PLATELETS: 218 10*3/uL (ref 150–400)
RBC: 5.33 MIL/uL — ABNORMAL HIGH (ref 3.87–5.11)
RDW: 14.1 % (ref 11.5–15.5)
WBC: 9.8 10*3/uL (ref 4.0–10.5)

## 2017-07-08 MED ORDER — DEXTROSE 5 % IV SOLN
3.0000 g | Freq: Once | INTRAVENOUS | Status: AC
  Administered 2017-07-09: 3 g via INTRAVENOUS
  Filled 2017-07-08 (×2): qty 3000

## 2017-07-08 MED ORDER — DEXTROSE 5 % IV SOLN
3.0000 g | Freq: Once | INTRAVENOUS | Status: DC
  Filled 2017-07-08: qty 3000

## 2017-07-08 NOTE — Patient Instructions (Addendum)
Janice Morrow  07/08/2017   Your procedure is scheduled on: Tuesday 07/09/2017  Report to [facility] Main  Entrance Take [place]  elevators to 3rd floor to  [place] at [place]   AM.     Call this number if you have problems the morning of surgery [phone]    Remember: ONLY 1 PERSON MAY GO WITH YOU TO SHORT STAY TO GET  READY MORNING OF [place].   Do not eat food or drink liquids :After Midnight.     Take these medicines the morning of surgery with A SIP OF WATER: Carvedilol (Coreg), use Anoro Ellipta inhaler and bring it with you to the hospital                                You may not have any metal on your body including hair pins and              piercings  Do not wear jewelry, make-up, lotions, powders or perfumes, deodorant             Do not wear nail polish.  Do not shave  48 hours prior to surgery.              Men may shave face and neck.   Do not bring valuables to the hospital. [place].  Contacts, dentures or bridgework may not be worn into surgery.  Leave suitcase in the car. After surgery it may be brought to your room.     Patients discharged the day of surgery will not be allowed to drive home.  Name and phone number of your driver:  Special Instructions: N/A              Please read over the following fact sheets you were given: _____________________________________________________________________             [facility] - Preparing for Surgery Before surgery, you can play an important role.  Because skin is not sterile, your skin needs to be as free of germs as possible.  You can reduce the number of germs on your skin by washing with CHG (chlorahexidine gluconate) soap before surgery.  CHG is an antiseptic cleaner which kills germs and bonds with the skin to continue killing germs even after washing. Please DO NOT use if you have an allergy to CHG or antibacterial  soaps.  If your skin becomes reddened/irritated stop using the CHG and inform your nurse when you arrive at Short Stay. Do not shave (including legs and underarms) for at least 48 hours prior to the first CHG shower.  You may shave your face/neck. Please follow these instructions carefully:  1.  Shower with CHG Soap the night before surgery and the  morning of Surgery.  2.  If you choose to wash your hair, wash your hair first as usual with your  normal  shampoo.  3.  After you shampoo, rinse your hair and body thoroughly to remove the  shampoo.                           4.  Use CHG as you would any other liquid soap.  You  can apply chg directly  to the skin and wash                       Gently with a scrungie or clean washcloth.  5.  Apply the CHG Soap to your body ONLY FROM THE NECK DOWN.   Do not use on face/ open                           Wound or open sores. Avoid contact with eyes, ears mouth and genitals (private parts).                       Wash face,  Genitals (private parts) with your normal soap.             6.  Wash thoroughly, paying special attention to the area where your surgery  will be performed.  7.  Thoroughly rinse your body with warm water from the neck down.  8.  DO NOT shower/wash with your normal soap after using and rinsing off  the CHG Soap.                9.  Pat yourself dry with a clean towel.            10.  Wear clean pajamas.            11.  Place clean sheets on your bed the night of your first shower and do not  sleep with pets. Day of Surgery : Do not apply any lotions/deodorants the morning of surgery.  Please wear clean clothes to the hospital/surgery center.  FAILURE TO FOLLOW THESE INSTRUCTIONS MAY RESULT IN THE CANCELLATION OF YOUR SURGERY PATIENT SIGNATURE_________________________________  NURSE SIGNATURE__________________________________  ________________________________________________________________________

## 2017-07-09 ENCOUNTER — Ambulatory Visit (HOSPITAL_COMMUNITY): Payer: Medicare Other

## 2017-07-09 ENCOUNTER — Encounter (HOSPITAL_COMMUNITY)
Admission: RE | Disposition: A | Discharge status: Home / Self Care | Payer: Self-pay | Source: Ambulatory Visit | Attending: Urology

## 2017-07-09 ENCOUNTER — Ambulatory Visit (HOSPITAL_COMMUNITY)
Admission: RE | Admit: 2017-07-09 | Discharge: 2017-07-09 | Disposition: A | Discharge status: Home / Self Care | Payer: Medicare Other | Source: Ambulatory Visit | Attending: Urology | Admitting: Urology

## 2017-07-09 ENCOUNTER — Encounter (HOSPITAL_COMMUNITY): Payer: Self-pay | Admitting: *Deleted

## 2017-07-09 ENCOUNTER — Other Ambulatory Visit: Payer: Self-pay

## 2017-07-09 ENCOUNTER — Ambulatory Visit (HOSPITAL_COMMUNITY): Payer: Medicare Other | Admitting: Certified Registered"

## 2017-07-09 ENCOUNTER — Other Ambulatory Visit: Payer: Self-pay | Admitting: Urology

## 2017-07-09 DIAGNOSIS — Z79899 Other long term (current) drug therapy: Secondary | ICD-10-CM | POA: Diagnosis not present

## 2017-07-09 DIAGNOSIS — J449 Chronic obstructive pulmonary disease, unspecified: Secondary | ICD-10-CM | POA: Insufficient documentation

## 2017-07-09 DIAGNOSIS — E119 Type 2 diabetes mellitus without complications: Secondary | ICD-10-CM | POA: Diagnosis not present

## 2017-07-09 DIAGNOSIS — G40909 Epilepsy, unspecified, not intractable, without status epilepticus: Secondary | ICD-10-CM | POA: Insufficient documentation

## 2017-07-09 DIAGNOSIS — G500 Trigeminal neuralgia: Secondary | ICD-10-CM | POA: Insufficient documentation

## 2017-07-09 DIAGNOSIS — Z7901 Long term (current) use of anticoagulants: Secondary | ICD-10-CM | POA: Diagnosis not present

## 2017-07-09 DIAGNOSIS — E785 Hyperlipidemia, unspecified: Secondary | ICD-10-CM | POA: Insufficient documentation

## 2017-07-09 DIAGNOSIS — N200 Calculus of kidney: Secondary | ICD-10-CM | POA: Diagnosis not present

## 2017-07-09 DIAGNOSIS — N3011 Interstitial cystitis (chronic) with hematuria: Secondary | ICD-10-CM | POA: Diagnosis not present

## 2017-07-09 DIAGNOSIS — Z86711 Personal history of pulmonary embolism: Secondary | ICD-10-CM | POA: Diagnosis not present

## 2017-07-09 DIAGNOSIS — Z87891 Personal history of nicotine dependence: Secondary | ICD-10-CM | POA: Diagnosis not present

## 2017-07-09 DIAGNOSIS — Z6841 Body Mass Index (BMI) 40.0 and over, adult: Secondary | ICD-10-CM | POA: Diagnosis not present

## 2017-07-09 DIAGNOSIS — I493 Ventricular premature depolarization: Secondary | ICD-10-CM | POA: Diagnosis not present

## 2017-07-09 DIAGNOSIS — I10 Essential (primary) hypertension: Secondary | ICD-10-CM | POA: Diagnosis not present

## 2017-07-09 HISTORY — PX: URETEROSCOPY WITH HOLMIUM LASER LITHOTRIPSY: SHX6645

## 2017-07-09 LAB — URINALYSIS, COMPLETE (UACMP) WITH MICROSCOPIC
BILIRUBIN URINE: NEGATIVE
Glucose, UA: NEGATIVE mg/dL
Ketones, ur: NEGATIVE mg/dL
Nitrite: NEGATIVE
PH: 5 (ref 5.0–8.0)
Protein, ur: 30 mg/dL — AB
SPECIFIC GRAVITY, URINE: 1.023 (ref 1.005–1.030)

## 2017-07-09 LAB — GLUCOSE, CAPILLARY
GLUCOSE-CAPILLARY: 156 mg/dL — AB (ref 65–99)
Glucose-Capillary: 132 mg/dL — ABNORMAL HIGH (ref 65–99)
Glucose-Capillary: 141 mg/dL — ABNORMAL HIGH (ref 65–99)

## 2017-07-09 SURGERY — URETEROSCOPY, WITH LITHOTRIPSY USING HOLMIUM LASER
Anesthesia: General | Laterality: Left

## 2017-07-09 MED ORDER — ROCURONIUM BROMIDE 50 MG/5ML IV SOSY
PREFILLED_SYRINGE | INTRAVENOUS | Status: AC
  Filled 2017-07-09: qty 5

## 2017-07-09 MED ORDER — FENTANYL CITRATE (PF) 100 MCG/2ML IJ SOLN
25.0000 ug | INTRAMUSCULAR | Status: DC | PRN
  Administered 2017-07-09 (×3): 50 ug via INTRAVENOUS

## 2017-07-09 MED ORDER — FENTANYL CITRATE (PF) 250 MCG/5ML IJ SOLN
INTRAMUSCULAR | Status: DC | PRN
  Administered 2017-07-09 (×2): 50 ug via INTRAVENOUS
  Administered 2017-07-09: 100 ug via INTRAVENOUS

## 2017-07-09 MED ORDER — LIDOCAINE 2% (20 MG/ML) 5 ML SYRINGE
INTRAMUSCULAR | Status: AC
  Filled 2017-07-09: qty 5

## 2017-07-09 MED ORDER — DEXAMETHASONE SODIUM PHOSPHATE 10 MG/ML IJ SOLN
INTRAMUSCULAR | Status: AC
  Filled 2017-07-09: qty 1

## 2017-07-09 MED ORDER — SUGAMMADEX SODIUM 500 MG/5ML IV SOLN
INTRAVENOUS | Status: DC | PRN
  Administered 2017-07-09: 300 mg via INTRAVENOUS

## 2017-07-09 MED ORDER — ROCURONIUM BROMIDE 100 MG/10ML IV SOLN
INTRAVENOUS | Status: DC | PRN
  Administered 2017-07-09: 10 mg via INTRAVENOUS
  Administered 2017-07-09: 20 mg via INTRAVENOUS
  Administered 2017-07-09: 50 mg via INTRAVENOUS

## 2017-07-09 MED ORDER — ONDANSETRON HCL 4 MG/2ML IJ SOLN
INTRAMUSCULAR | Status: DC | PRN
  Administered 2017-07-09: 4 mg via INTRAVENOUS

## 2017-07-09 MED ORDER — MIDAZOLAM HCL 2 MG/2ML IJ SOLN
INTRAMUSCULAR | Status: DC | PRN
  Administered 2017-07-09: 2 mg via INTRAVENOUS

## 2017-07-09 MED ORDER — NITROFURANTOIN MONOHYD MACRO 100 MG PO CAPS: 100.0000 mg | ORAL_CAPSULE | Freq: Two times a day (BID) | ORAL | 0 refills | Status: AC

## 2017-07-09 MED ORDER — FENTANYL CITRATE (PF) 100 MCG/2ML IJ SOLN
INTRAMUSCULAR | Status: AC
  Filled 2017-07-09: qty 2

## 2017-07-09 MED ORDER — FENTANYL CITRATE (PF) 250 MCG/5ML IJ SOLN
INTRAMUSCULAR | Status: AC
  Filled 2017-07-09: qty 5

## 2017-07-09 MED ORDER — ONDANSETRON HCL 4 MG/2ML IJ SOLN
INTRAMUSCULAR | Status: AC
  Filled 2017-07-09: qty 2

## 2017-07-09 MED ORDER — SODIUM CHLORIDE 0.9 % IR SOLN
Status: DC | PRN
  Administered 2017-07-09: 8000 mL

## 2017-07-09 MED ORDER — LABETALOL HCL 5 MG/ML IV SOLN
INTRAVENOUS | Status: DC | PRN
  Administered 2017-07-09: 5 mg via INTRAVENOUS

## 2017-07-09 MED ORDER — SUCCINYLCHOLINE CHLORIDE 200 MG/10ML IV SOSY
PREFILLED_SYRINGE | INTRAVENOUS | Status: AC
  Filled 2017-07-09: qty 10

## 2017-07-09 MED ORDER — DEXTROSE 5 % IV SOLN
2.0000 g | INTRAVENOUS | Status: AC
  Administered 2017-07-09: 2 g via INTRAVENOUS

## 2017-07-09 MED ORDER — DEXAMETHASONE SODIUM PHOSPHATE 10 MG/ML IJ SOLN
INTRAMUSCULAR | Status: DC | PRN
  Administered 2017-07-09: 10 mg via INTRAVENOUS

## 2017-07-09 MED ORDER — MIDAZOLAM HCL 2 MG/2ML IJ SOLN
INTRAMUSCULAR | Status: AC
  Filled 2017-07-09: qty 2

## 2017-07-09 MED ORDER — PROPOFOL 10 MG/ML IV BOLUS
INTRAVENOUS | Status: DC | PRN
  Administered 2017-07-09: 200 mg via INTRAVENOUS

## 2017-07-09 MED ORDER — MIDAZOLAM HCL 2 MG/2ML IJ SOLN: 0.5000 mg | Freq: Once | INTRAMUSCULAR | Status: DC | PRN

## 2017-07-09 MED ORDER — PROPOFOL 10 MG/ML IV BOLUS
INTRAVENOUS | Status: AC
  Filled 2017-07-09: qty 40

## 2017-07-09 MED ORDER — DEXTROSE 5 % IV SOLN
INTRAVENOUS | Status: AC
  Filled 2017-07-09 (×2): qty 10

## 2017-07-09 MED ORDER — MEPERIDINE HCL 50 MG/ML IJ SOLN: 6.2500 mg | INTRAMUSCULAR | Status: DC | PRN

## 2017-07-09 MED ORDER — LIDOCAINE HCL (CARDIAC) 20 MG/ML IV SOLN
INTRAVENOUS | Status: DC | PRN
  Administered 2017-07-09: 40 mg via INTRATRACHEAL

## 2017-07-09 MED ORDER — LACTATED RINGERS IV SOLN
INTRAVENOUS | Status: DC | PRN
  Administered 2017-07-09 (×2): via INTRAVENOUS

## 2017-07-09 MED ORDER — PROMETHAZINE HCL 25 MG/ML IJ SOLN: 6.2500 mg | INTRAMUSCULAR | Status: DC | PRN

## 2017-07-09 MED ORDER — 0.9 % SODIUM CHLORIDE (POUR BTL) OPTIME
TOPICAL | Status: DC | PRN
  Administered 2017-07-09: 1000 mL

## 2017-07-09 SURGICAL SUPPLY — 22 items
BAG URO CATCHER STRL LF (MISCELLANEOUS) ×3 IMPLANT
BASKET ZERO TIP NITINOL 2.4FR (BASKET) IMPLANT
CATH INTERMIT  6FR 70CM (CATHETERS) ×3 IMPLANT
CATH URET 5FR 28IN CONE TIP (BALLOONS)
CATH URET 5FR 70CM CONE TIP (BALLOONS) IMPLANT
CATH URET WHISTLE 6FR (CATHETERS) IMPLANT
CLOTH BEACON ORANGE TIMEOUT ST (SAFETY) ×3 IMPLANT
COVER FOOTSWITCH UNIV (MISCELLANEOUS) IMPLANT
COVER SURGICAL LIGHT HANDLE (MISCELLANEOUS) IMPLANT
FIBER LASER TRAC TIP (UROLOGICAL SUPPLIES) ×3 IMPLANT
GLOVE BIO SURGEON STRL SZ7.5 (GLOVE) ×3 IMPLANT
GOWN STRL REUS W/TWL XL LVL3 (GOWN DISPOSABLE) ×3 IMPLANT
GUIDEWIRE ANG ZIPWIRE 038X150 (WIRE) ×3 IMPLANT
GUIDEWIRE STR DUAL SENSOR (WIRE) ×3 IMPLANT
MANIFOLD NEPTUNE II (INSTRUMENTS) ×3 IMPLANT
PACK CYSTO (CUSTOM PROCEDURE TRAY) ×3 IMPLANT
SHEATH ACCESS URETERAL 24CM (SHEATH) IMPLANT
SHEATH URETERAL 12FRX35CM (MISCELLANEOUS) ×3 IMPLANT
STENT CONTOUR 6FRX26X.038 (STENTS) ×3 IMPLANT
TUBING CONNECTING 10 (TUBING) ×2 IMPLANT
TUBING CONNECTING 10' (TUBING) ×1
WIRE COONS/BENSON .038X145CM (WIRE) IMPLANT

## 2017-07-09 NOTE — Anesthesia Procedure Notes (Signed)
Procedure Name: Intubation Date/Time: 07/09/2017 7:47 AM Performed by: Pilar Grammes, CRNA Pre-anesthesia Checklist: Patient identified, Emergency Drugs available, Suction available, Patient being monitored and Timeout performed Patient Re-evaluated:Patient Re-evaluated prior to induction Oxygen Delivery Method: Circle system utilized Preoxygenation: Pre-oxygenation with 100% oxygen Induction Type: IV induction Ventilation: Mask ventilation without difficulty Laryngoscope Size: Miller and 3 Tube type: Oral Tube size: 7.0 mm Number of attempts: 1 Airway Equipment and Method: Stylet Placement Confirmation: positive ETCO2,  ETT inserted through vocal cords under direct vision,  CO2 detector and breath sounds checked- equal and bilateral Secured at: 23 cm Tube secured with: Tape Dental Injury: Teeth and Oropharynx as per pre-operative assessment

## 2017-07-09 NOTE — Transfer of Care (Signed)
Immediate Anesthesia Transfer of Care Note  Patient: ZILAH VILLAFLOR  Procedure(s) Performed: URETEROSCOPY WITH HOLMIUM LASER LITHOTRIPSY/ STENT (Left )  Patient Location: PACU  Anesthesia Type:General  Level of Consciousness: alert , sedated, patient cooperative and responds to stimulation  Airway & Oxygen Therapy: Patient connected to face mask oxygen  Post-op Assessment: Report given to RN and Post -op Vital signs reviewed and stable  Post vital signs: stable  Last Vitals:  Vitals:   07/09/17 0510  BP: (!) 156/113  Pulse: (!) 108  Resp: 20  Temp: 36.8 C  SpO2: 96%    Last Pain:  Vitals:   07/09/17 0539  TempSrc:   PainSc: 4       Patients Stated Pain Goal: 4 (37/16/96 7893)  Complications: No apparent anesthesia complications

## 2017-07-09 NOTE — Anesthesia Preprocedure Evaluation (Addendum)
Anesthesia Evaluation  Patient identified by MRN, date of birth, ID band Patient awake    Reviewed: Allergy & Precautions, NPO status , Patient's Chart, lab work & pertinent test results, reviewed documented beta blocker date and time   History of Anesthesia Complications Negative for: history of anesthetic complications  Airway Mallampati: II  TM Distance: >3 FB Neck ROM: Full    Dental  (+) Edentulous Upper, Edentulous Lower   Pulmonary sleep apnea and Continuous Positive Airway Pressure Ventilation , COPD,  COPD inhaler, former smoker (quit 2011), PE   breath sounds clear to auscultation       Cardiovascular hypertension, Pt. on medications and Pt. on home beta blockers + Peripheral Vascular Disease   Rhythm:Regular Rate:Normal  4/18 ECHO: LVEF 55-60%, impaired relaxation (Grade 1), valves OK   Neuro/Psych  Headaches, Seizures -, Well Controlled,  Trigeminal neuralgia    GI/Hepatic negative GI ROS, Neg liver ROS,   Endo/Other  diabetes (no meds, diet control)Morbid obesity  Renal/GU Renal InsufficiencyRenal disease (creat 1.33)     Musculoskeletal  (+) Arthritis ,   Abdominal (+) + obese,   Peds  Hematology Eliquis: last dose Friday   Anesthesia Other Findings   Reproductive/Obstetrics                            Anesthesia Physical Anesthesia Plan  ASA: III  Anesthesia Plan: General   Post-op Pain Management:    Induction: Intravenous  PONV Risk Score and Plan: 3 and Ondansetron, Dexamethasone and Midazolam  Airway Management Planned: Oral ETT  Additional Equipment:   Intra-op Plan:   Post-operative Plan: Extubation in OR  Informed Consent: I have reviewed the patients History and Physical, chart, labs and discussed the procedure including the risks, benefits and alternatives for the proposed anesthesia with the patient or authorized representative who has indicated his/her  understanding and acceptance.   Dental advisory given  Plan Discussed with: CRNA and Surgeon  Anesthesia Plan Comments: (Plan routine monitors, GETA)        Anesthesia Quick Evaluation

## 2017-07-09 NOTE — Discharge Instructions (Signed)

## 2017-07-09 NOTE — Progress Notes (Signed)
[facility] has documented for IV Insertion/ Please note charting by P. Faysal Fenoglio in PACU for IV.

## 2017-07-09 NOTE — Anesthesia Postprocedure Evaluation (Signed)
Anesthesia Post Note  Patient: Janice Morrow  Procedure(s) Performed: URETEROSCOPY WITH HOLMIUM LASER LITHOTRIPSY/ STENT (Left )     Patient location during evaluation: PACU Anesthesia Type: General Level of consciousness: sedated, patient cooperative and oriented Pain management: pain level controlled Vital Signs Assessment: post-procedure vital signs reviewed and stable Respiratory status: spontaneous breathing, nonlabored ventilation, respiratory function stable and patient connected to nasal cannula oxygen Cardiovascular status: blood pressure returned to baseline and stable Postop Assessment: no apparent nausea or vomiting Anesthetic complications: no    Last Vitals:  Vitals:   07/09/17 1111 07/09/17 1137  BP: (!) 157/93 (!) 161/78  Pulse: 83 100  Resp: 12 16  Temp: 36.6 C 36.9 C  SpO2: 95% 93%    Last Pain:  Vitals:   07/09/17 1111  TempSrc:   PainSc: 0-No pain                 Addeline Calarco,E. Baby Stairs

## 2017-07-09 NOTE — H&P (View-Only) (Signed)
Office Visit Report     07/08/2017   --------------------------------------------------------------------------------   Janice Morrow  MRN: [id number]  PRIMARY CARE:  Janice Lima, MD  DOB: [birth year], 71 year old Female  REFERRING:  Janice Dover, MD  SSN: [id number]  PROVIDER:  Festus Morrow, M.D.    TREATING:  Janice Morrow    LOCATION:  [facility] [phone]   --------------------------------------------------------------------------------   CC: I have kidney stones.  HPI: Janice Morrow is a 71 year-old female established patient who is here for renal calculi.  September 2018: Patient underwent a KUB on December 02, 2016 which showed a 3 cm left renal pelvic stone. She underwent a follow-up CT scan which revealed the stone and possible hydronephrosis of the upper pole (18 cm SSD, 950 HU, visible). She hasn't had any follow-up imaging. She comes in today because she's had worsening intermittent left flank pain. She has no gross hematuria. Occasional dysuria. No fevers.   Past medical history includes pulmonary embolism when she started in Eliquis May 2018 for highly suspicious scan for PE. She also is obese and has COPD. She walks very little and said she can't stand today for an xray.   07/08/17: Patient returns today for preoperative appointment. She is scheduled for left ureteroscopy on 11-13. She denies any significant changes in her overall health since she was seen last. She denies any changes in her voiding habits. She denies dysuria, gross hematuria, or fevers. She did stop using her Eliquis on Saturday for her upcoming procedure. She has been using nitrofurantoin, as prescribed, since Thursday based on his recent urine culture.   The problem is on the left side. She first stated noticing pain on 12/02/2016. This is her first kidney stone. She is currently having back pain. She denies having flank pain, groin pain, nausea, vomiting, fever, and chills. She has  not caught a stone in her urine strainer since her symptoms began.   She has never had surgical treatment for calculi in the past.     ALLERGIES: Baclofen Naproxen    MEDICATIONS: Anoro Ellipta 62.5 mcg-25 mcg/actuation blister, with inhalation device  Carvedilol 3.125 mg tablet  Eliquis 5 mg tablet  Furosemide 40 mg tablet  Gabapentin 400 mg capsule  Hydrocodone-Acetaminophen 10 mg-325 mg tablet  Nitrofurantoin 100 mg capsule 1 capsule PO BID start five days prior to procedure  Tizanidine Hcl     GU PSH: None   NON-GU PSH: None   GU PMH: Renal calculus - 05/06/2017    NON-GU PMH: Acute embolism and thrombosis of other specified deep vein of unspecified lower extremity Cardiac murmur, unspecified Chronic pulmonary embolism COPD Heart disease, unspecified Hyperlipidemia, unspecified Hypertension Obesity Obstructive sleep apnea (adult) (pediatric) Other pulmonary embolism without acute cor pulmonale Personal history of colonic polyps Prediabetes Seizure disorder    FAMILY HISTORY: Deceased - Daughter, Father Myocardial Infarction - Father   SOCIAL HISTORY: Marital Status: Unknown Preferred Language: English; Ethnicity: Not Hispanic Or Latino; Race: Black or African American Current Smoking Status: Patient has never smoked.   Tobacco Use Assessment Completed: Used Tobacco in last 30 days? Has never drank.  Drinks 4+ caffeinated drinks per day.     Notes: 1 deceased daughter   REVIEW OF SYSTEMS:    GU Review Female:   Patient denies frequent urination, hard to postpone urination, burning /pain with urination, get up at night to urinate, leakage of urine, stream starts and stops, trouble starting your stream, have to strain to  urinate, and being pregnant.  Gastrointestinal (Upper):   Patient denies nausea, vomiting, and indigestion/ heartburn.  Gastrointestinal (Lower):   Patient denies diarrhea and constipation.  Constitutional:   Patient denies night sweats,  fatigue, fever, and weight loss.  Skin:   Patient denies skin rash/ lesion and itching.  Eyes:   Patient denies blurred vision and double vision.  Ears/ Nose/ Throat:   Patient denies sore throat and sinus problems.  Hematologic/Lymphatic:   Patient denies swollen glands and easy bruising.  Cardiovascular:   Patient reports leg swelling. Patient denies chest pains.  Respiratory:   Patient reports cough. Patient denies shortness of breath.  Endocrine:   Patient denies excessive thirst.  Musculoskeletal:   Patient reports back pain and joint pain.   Neurological:   Patient reports headaches. Patient denies dizziness.  Psychologic:   Patient denies depression and anxiety.   VITAL SIGNS:      07/08/2017 04:01 PM  Weight 349 lb / 158.3 kg  Height 65.5 in / 166.37 cm  BP 134/90 mmHg  Pulse 72 /min  Temperature 97.5 F / 36.3 C  BMI 57.2 kg/m   MULTI-SYSTEM PHYSICAL EXAMINATION:    Constitutional: Obese. No physical deformities. Normally developed. Good grooming.   Respiratory: Normal breath sounds. No labored breathing, no use of accessory muscles.   Cardiovascular: Regular rate and rhythm. No murmur, no gallop. Extremity swelling. Normal temperature, no varicosities.   Skin: No paleness, no jaundice, no cyanosis. No lesion, no ulcer, no rash.  Neurologic / Psychiatric: Oriented to time, oriented to place, oriented to person. No depression, no anxiety, no agitation.  Gastrointestinal: Obese abdomen. No mass, no tenderness, no rigidity.   Musculoskeletal: Spine, ribs, pelvis no bilateral tenderness. Normal gait and station of head and neck. In wheelchair today.      PAST DATA REVIEWED:  Source Of History:  Patient  Records Review:   Previous Patient Records  Urine Test Review:   Urinalysis, Urine Culture  X-Ray Review: C.T. Abdomen/Pelvis: Reviewed Films. Reviewed Report.     PROCEDURES: None   ASSESSMENT:      ICD-10 Details  1 GU:   Renal calculus - N20.0    PLAN:            Document Letter(s):  Created for Patient: Clinical Summary         Notes:   Patient was unable to leave urine specimen today. We discussed obtaining a catheterized specimen for UA; however, the patient declined this today. She is not currently symptomatic for any infectious process and has also been on antibiotics. I will discuss this with her urologist prior to her scheduled surgery tomorrow. We discussed that she remain off her Eliquis until instructed to restart it after the procedure. We again discussed her upcoming procedure. I answered all of her questions to the best of my ability. I'll plan to have her follow-up for her procedure, as scheduled.    * Signed by Janice Morrow on 07/09/17 at 6:36 AM (EST)*     The information contained in this medical record document is considered private and confidential patient information. This information can only be used for the medical diagnosis and/or medical services that are being provided by the patient's selected caregivers. This information can only be distributed outside of the patient's care if the patient agrees and signs waivers of authorization for this information to be sent to an outside source or route.   Office Visit Report     07/08/2017   --------------------------------------------------------------------------------  Janice Morrow  MRN: [id number]  PRIMARY CARE:  Janice Lima, MD  DOB: 01/10/1946, 71 year old Female  REFERRING:  Janice Dover, MD  SSN: -[id number]  PROVIDER:  Festus Morrow, M.D.    TREATING:  Janice Morrow    LOCATION:  [facility] [phone]   --------------------------------------------------------------------------------   CC: I have kidney stones.  HPI: Rylie Limburg is a 71 year-old female established patient who is here for renal calculi.  September 2018: Patient underwent a KUB on December 02, 2016 which showed a 3 cm left renal pelvic stone. She underwent a follow-up CT scan which  revealed the stone and possible hydronephrosis of the upper pole (18 cm SSD, 950 HU, visible). She hasn't had any follow-up imaging. She comes in today because she's had worsening intermittent left flank pain. She has no gross hematuria. Occasional dysuria. No fevers.   Past medical history includes pulmonary embolism when she started in Eliquis May 2018 for highly suspicious scan for PE. She also is obese and has COPD. She walks very little and said she can't stand today for an xray.   07/08/17: Patient returns today for preoperative appointment. She is scheduled for left ureteroscopy on 11-13. She denies any significant changes in her overall health since she was seen last. She denies any changes in her voiding habits. She denies dysuria, gross hematuria, or fevers. She did stop using her Eliquis on Saturday for her upcoming procedure. She has been using nitrofurantoin, as prescribed, since Thursday based on his recent urine culture.   The problem is on the left side. She first stated noticing pain on 12/02/2016. This is her first kidney stone. She is currently having back pain. She denies having flank pain, groin pain, nausea, vomiting, fever, and chills. She has not caught a stone in her urine strainer since her symptoms began.   She has never had surgical treatment for calculi in the past.     ALLERGIES: Baclofen Naproxen    MEDICATIONS: Anoro Ellipta 62.5 mcg-25 mcg/actuation blister, with inhalation device  Carvedilol 3.125 mg tablet  Eliquis 5 mg tablet  Furosemide 40 mg tablet  Gabapentin 400 mg capsule  Hydrocodone-Acetaminophen 10 mg-325 mg tablet  Nitrofurantoin 100 mg capsule 1 capsule PO BID start five days prior to procedure  Tizanidine Hcl     GU PSH: None   NON-GU PSH: None   GU PMH: Renal calculus - 05/06/2017    NON-GU PMH: Acute embolism and thrombosis of other specified deep vein of unspecified lower extremity Cardiac murmur, unspecified Chronic pulmonary  embolism COPD Heart disease, unspecified Hyperlipidemia, unspecified Hypertension Obesity Obstructive sleep apnea (adult) (pediatric) Other pulmonary embolism without acute cor pulmonale Personal history of colonic polyps Prediabetes Seizure disorder    FAMILY HISTORY: Deceased - Daughter, Father Myocardial Infarction - Father   SOCIAL HISTORY: Marital Status: Unknown Preferred Language: English; Ethnicity: Not Hispanic Or Latino; Race: Black or African American Current Smoking Status: Patient has never smoked.   Tobacco Use Assessment Completed: Used Tobacco in last 30 days? Has never drank.  Drinks 4+ caffeinated drinks per day.     Notes: 1 deceased daughter   REVIEW OF SYSTEMS:    GU Review Female:   Patient denies frequent urination, hard to postpone urination, burning /pain with urination, get up at night to urinate, leakage of urine, stream starts and stops, trouble starting your stream, have to strain to urinate, and being pregnant.  Gastrointestinal (Upper):   Patient denies nausea, vomiting, and  indigestion/ heartburn.  Gastrointestinal (Lower):   Patient denies diarrhea and constipation.  Constitutional:   Patient denies night sweats, fatigue, fever, and weight loss.  Skin:   Patient denies skin rash/ lesion and itching.  Eyes:   Patient denies blurred vision and double vision.  Ears/ Nose/ Throat:   Patient denies sore throat and sinus problems.  Hematologic/Lymphatic:   Patient denies swollen glands and easy bruising.  Cardiovascular:   Patient reports leg swelling. Patient denies chest pains.  Respiratory:   Patient reports cough. Patient denies shortness of breath.  Endocrine:   Patient denies excessive thirst.  Musculoskeletal:   Patient reports back pain and joint pain.   Neurological:   Patient reports headaches. Patient denies dizziness.  Psychologic:   Patient denies depression and anxiety.   VITAL SIGNS:      07/08/2017 04:01 PM  Weight 349 lb / 158.3  kg  Height 65.5 in / 166.37 cm  BP 134/90 mmHg  Pulse 72 /min  Temperature 97.5 F / 36.3 C  BMI 57.2 kg/m   MULTI-SYSTEM PHYSICAL EXAMINATION:    Constitutional: Obese. No physical deformities. Normally developed. Good grooming.   Respiratory: Normal breath sounds. No labored breathing, no use of accessory muscles.   Cardiovascular: Regular rate and rhythm. No murmur, no gallop. Extremity swelling. Normal temperature, no varicosities.   Skin: No paleness, no jaundice, no cyanosis. No lesion, no ulcer, no rash.  Neurologic / Psychiatric: Oriented to time, oriented to place, oriented to person. No depression, no anxiety, no agitation.  Gastrointestinal: Obese abdomen. No mass, no tenderness, no rigidity.   Musculoskeletal: Spine, ribs, pelvis no bilateral tenderness. Normal gait and station of head and neck. In wheelchair today.      PAST DATA REVIEWED:  Source Of History:  Patient  Records Review:   Previous Patient Records  Urine Test Review:   Urinalysis, Urine Culture  X-Ray Review: C.T. Abdomen/Pelvis: Reviewed Films. Reviewed Report.     PROCEDURES: None   ASSESSMENT:      ICD-10 Details  1 GU:   Renal calculus - N20.0    PLAN:           Document Letter(s):  Created for Patient: Clinical Summary         Notes:   Patient was unable to leave urine specimen today. We discussed obtaining a catheterized specimen for UA; however, the patient declined this today. She is not currently symptomatic for any infectious process and has also been on antibiotics. I will discuss this with her urologist prior to her scheduled surgery tomorrow. We discussed that she remain off her Eliquis until instructed to restart it after the procedure. We again discussed her upcoming procedure. I answered all of her questions to the best of my ability. I'll plan to have her follow-up for her procedure, as scheduled.    * Signed by Janice Morrow on 07/09/17 at 6:36 AM (EST)*     The information contained  in this medical record document is considered private and confidential patient information. This information can only be used for the medical diagnosis and/or medical services that are being provided by the patient's selected caregivers. This information can only be distributed outside of the patient's care if the patient agrees and signs waivers of authorization for this information to be sent to an outside source or route.  Addendum: I spoke with NP Ronal Fear about the patient and agree with her assessment and plan.

## 2017-07-09 NOTE — Interval H&P Note (Signed)
History and Physical Interval Note:  07/09/2017 7:27 AM  Janice Morrow  has presented today for surgery, with the diagnosis of LEFT RENAL STONE  The various methods of treatment have been discussed with the patient and family. After consideration of risks, benefits and other options for treatment, the patient has consented to  Procedure(s): URETEROSCOPY WITH HOLMIUM LASER LITHOTRIPSY/ STENT (Left) as a surgical intervention. The patient's history has been reviewed, patient examined, no change in status, stable for surgery.  I have reviewed the patient's charts, imaging and labs. We discussed again need for a staged procedure and we may only get as far as a stent today. She's been well without cough, SOB, fever or dysuria.  Questions were answered to the patient's satisfaction.     Janice Morrow

## 2017-07-09 NOTE — Interval H&P Note (Signed)
History and Physical Interval Note:  07/09/2017 7:27 AM  Janice Morrow  has presented today for surgery, with the diagnosis of LEFT RENAL STONE  The various methods of treatment have been discussed with the patient and family. After consideration of risks, benefits and other options for treatment, the patient has consented to  Procedure(s): URETEROSCOPY WITH HOLMIUM LASER LITHOTRIPSY/ STENT (Left) as a surgical intervention .  The patient's history has been reviewed, patient examined, no change in status, stable for surgery.  I have reviewed the patient's chart and labs.  Questions were answered to the patient's satisfaction.     [name]

## 2017-07-09 NOTE — H&P (Signed)
Office Visit Report     07/08/2017   --------------------------------------------------------------------------------   Janice Morrow  MRN: [id number]  PRIMARY CARE:  Janith Lima, MD  DOB: 1946/08/05, 71 year old Female  REFERRING:  Georgette Dover, MD  SSN: -[id number]  PROVIDER:  Festus Aloe, M.D.    TREATING:  Azucena Fallen    LOCATION:  [facility] [phone]   --------------------------------------------------------------------------------   CC: I have kidney stones.  HPI: Janice Morrow is a 71 year-old female established patient who is here for renal calculi.  September 2018: Patient underwent a KUB on December 02, 2016 which showed a 3 cm left renal pelvic stone. She underwent a follow-up CT scan which revealed the stone and possible hydronephrosis of the upper pole (18 cm SSD, 950 HU, visible). She hasn't had any follow-up imaging. She comes in today because she's had worsening intermittent left flank pain. She has no gross hematuria. Occasional dysuria. No fevers.   Past medical history includes pulmonary embolism when she started in Eliquis May 2018 for highly suspicious scan for PE. She also is obese and has COPD. She walks very little and said she can't stand today for an xray.   07/08/17: Patient returns today for preoperative appointment. She is scheduled for left ureteroscopy on 11-13. She denies any significant changes in her overall health since she was seen last. She denies any changes in her voiding habits. She denies dysuria, gross hematuria, or fevers. She did stop using her Eliquis on Saturday for her upcoming procedure. She has been using nitrofurantoin, as prescribed, since Thursday based on his recent urine culture.   The problem is on the left side. She first stated noticing pain on 12/02/2016. This is her first kidney stone. She is currently having back pain. She denies having flank pain, groin pain, nausea, vomiting, fever, and chills. She has  not caught a stone in her urine strainer since her symptoms began.   She has never had surgical treatment for calculi in the past.     ALLERGIES: Baclofen Naproxen    MEDICATIONS: Anoro Ellipta 62.5 mcg-25 mcg/actuation blister, with inhalation device  Carvedilol 3.125 mg tablet  Eliquis 5 mg tablet  Furosemide 40 mg tablet  Gabapentin 400 mg capsule  Hydrocodone-Acetaminophen 10 mg-325 mg tablet  Nitrofurantoin 100 mg capsule 1 capsule PO BID start five days prior to procedure  Tizanidine Hcl     GU PSH: None   NON-GU PSH: None   GU PMH: Renal calculus - 05/06/2017    NON-GU PMH: Acute embolism and thrombosis of other specified deep vein of unspecified lower extremity Cardiac murmur, unspecified Chronic pulmonary embolism COPD Heart disease, unspecified Hyperlipidemia, unspecified Hypertension Obesity Obstructive sleep apnea (adult) (pediatric) Other pulmonary embolism without acute cor pulmonale Personal history of colonic polyps Prediabetes Seizure disorder    FAMILY HISTORY: Deceased - Daughter, Father Myocardial Infarction - Father   SOCIAL HISTORY: Marital Status: Unknown Preferred Language: English; Ethnicity: Not Hispanic Or Latino; Race: Black or African American Current Smoking Status: Patient has never smoked.   Tobacco Use Assessment Completed: Used Tobacco in last 30 days? Has never drank.  Drinks 4+ caffeinated drinks per day.     Notes: 1 deceased daughter   REVIEW OF SYSTEMS:    GU Review Female:   Patient denies frequent urination, hard to postpone urination, burning /pain with urination, get up at night to urinate, leakage of urine, stream starts and stops, trouble starting your stream, have to strain to  urinate, and being pregnant.  Gastrointestinal (Upper):   Patient denies nausea, vomiting, and indigestion/ heartburn.  Gastrointestinal (Lower):   Patient denies diarrhea and constipation.  Constitutional:   Patient denies night sweats,  fatigue, fever, and weight loss.  Skin:   Patient denies skin rash/ lesion and itching.  Eyes:   Patient denies blurred vision and double vision.  Ears/ Nose/ Throat:   Patient denies sore throat and sinus problems.  Hematologic/Lymphatic:   Patient denies swollen glands and easy bruising.  Cardiovascular:   Patient reports leg swelling. Patient denies chest pains.  Respiratory:   Patient reports cough. Patient denies shortness of breath.  Endocrine:   Patient denies excessive thirst.  Musculoskeletal:   Patient reports back pain and joint pain.   Neurological:   Patient reports headaches. Patient denies dizziness.  Psychologic:   Patient denies depression and anxiety.   VITAL SIGNS:      07/08/2017 04:01 PM  Weight 349 lb / 158.3 kg  Height 65.5 in / 166.37 cm  BP 134/90 mmHg  Pulse 72 /min  Temperature 97.5 F / 36.3 C  BMI 57.2 kg/m   MULTI-SYSTEM PHYSICAL EXAMINATION:    Constitutional: Obese. No physical deformities. Normally developed. Good grooming.   Respiratory: Normal breath sounds. No labored breathing, no use of accessory muscles.   Cardiovascular: Regular rate and rhythm. No murmur, no gallop. Extremity swelling. Normal temperature, no varicosities.   Skin: No paleness, no jaundice, no cyanosis. No lesion, no ulcer, no rash.  Neurologic / Psychiatric: Oriented to time, oriented to place, oriented to person. No depression, no anxiety, no agitation.  Gastrointestinal: Obese abdomen. No mass, no tenderness, no rigidity.   Musculoskeletal: Spine, ribs, pelvis no bilateral tenderness. Normal gait and station of head and neck. In wheelchair today.      PAST DATA REVIEWED:  Source Of History:  Patient  Records Review:   Previous Patient Records  Urine Test Review:   Urinalysis, Urine Culture  X-Ray Review: C.T. Abdomen/Pelvis: Reviewed Films. Reviewed Report.     PROCEDURES: None   ASSESSMENT:      ICD-10 Details  1 GU:   Renal calculus - N20.0    PLAN:            Document Letter(s):  Created for Patient: Clinical Summary         Notes:   Patient was unable to leave urine specimen today. We discussed obtaining a catheterized specimen for UA; however, the patient declined this today. She is not currently symptomatic for any infectious process and has also been on antibiotics. I will discuss this with her urologist prior to her scheduled surgery tomorrow. We discussed that she remain off her Eliquis until instructed to restart it after the procedure. We again discussed her upcoming procedure. I answered all of her questions to the best of my ability. I'll plan to have her follow-up for her procedure, as scheduled.    * Signed by Azucena Fallen on 07/09/17 at 6:36 AM (EST)*     The information contained in this medical record document is considered private and confidential patient information. This information can only be used for the medical diagnosis and/or medical services that are being provided by the patient's selected caregivers. This information can only be distributed outside of the patient's care if the patient agrees and signs waivers of authorization for this information to be sent to an outside source or route.   Office Visit Report     07/08/2017   --------------------------------------------------------------------------------  Janice Morrow  MRN: [id number]  PRIMARY CARE:  Janith Lima, MD  DOB: 11-Aug-1946, 71 year old Female  REFERRING:  Georgette Dover, MD  SSN: -[id number]  PROVIDER:  Festus Aloe, M.D.    TREATING:  Azucena Fallen    LOCATION:  [facility] [phone]   --------------------------------------------------------------------------------   CC: I have kidney stones.  HPI: Janice Morrow is a 71 year-old female established patient who is here for renal calculi.  September 2018: Patient underwent a KUB on December 02, 2016 which showed a 3 cm left renal pelvic stone. She underwent a follow-up CT scan which  revealed the stone and possible hydronephrosis of the upper pole (18 cm SSD, 950 HU, visible). She hasn't had any follow-up imaging. She comes in today because she's had worsening intermittent left flank pain. She has no gross hematuria. Occasional dysuria. No fevers.   Past medical history includes pulmonary embolism when she started in Eliquis May 2018 for highly suspicious scan for PE. She also is obese and has COPD. She walks very little and said she can't stand today for an xray.   07/08/17: Patient returns today for preoperative appointment. She is scheduled for left ureteroscopy on 11-13. She denies any significant changes in her overall health since she was seen last. She denies any changes in her voiding habits. She denies dysuria, gross hematuria, or fevers. She did stop using her Eliquis on Saturday for her upcoming procedure. She has been using nitrofurantoin, as prescribed, since Thursday based on his recent urine culture.   The problem is on the left side. She first stated noticing pain on 12/02/2016. This is her first kidney stone. She is currently having back pain. She denies having flank pain, groin pain, nausea, vomiting, fever, and chills. She has not caught a stone in her urine strainer since her symptoms began.   She has never had surgical treatment for calculi in the past.     ALLERGIES: Baclofen Naproxen    MEDICATIONS: Anoro Ellipta 62.5 mcg-25 mcg/actuation blister, with inhalation device  Carvedilol 3.125 mg tablet  Eliquis 5 mg tablet  Furosemide 40 mg tablet  Gabapentin 400 mg capsule  Hydrocodone-Acetaminophen 10 mg-325 mg tablet  Nitrofurantoin 100 mg capsule 1 capsule PO BID start five days prior to procedure  Tizanidine Hcl     GU PSH: None   NON-GU PSH: None   GU PMH: Renal calculus - 05/06/2017    NON-GU PMH: Acute embolism and thrombosis of other specified deep vein of unspecified lower extremity Cardiac murmur, unspecified Chronic pulmonary  embolism COPD Heart disease, unspecified Hyperlipidemia, unspecified Hypertension Obesity Obstructive sleep apnea (adult) (pediatric) Other pulmonary embolism without acute cor pulmonale Personal history of colonic polyps Prediabetes Seizure disorder    FAMILY HISTORY: Deceased - Daughter, Father Myocardial Infarction - Father   SOCIAL HISTORY: Marital Status: Unknown Preferred Language: English; Ethnicity: Not Hispanic Or Latino; Race: Black or African American Current Smoking Status: Patient has never smoked.   Tobacco Use Assessment Completed: Used Tobacco in last 30 days? Has never drank.  Drinks 4+ caffeinated drinks per day.     Notes: 1 deceased daughter   REVIEW OF SYSTEMS:    GU Review Female:   Patient denies frequent urination, hard to postpone urination, burning /pain with urination, get up at night to urinate, leakage of urine, stream starts and stops, trouble starting your stream, have to strain to urinate, and being pregnant.  Gastrointestinal (Upper):   Patient denies nausea, vomiting, and  indigestion/ heartburn.  Gastrointestinal (Lower):   Patient denies diarrhea and constipation.  Constitutional:   Patient denies night sweats, fatigue, fever, and weight loss.  Skin:   Patient denies skin rash/ lesion and itching.  Eyes:   Patient denies blurred vision and double vision.  Ears/ Nose/ Throat:   Patient denies sore throat and sinus problems.  Hematologic/Lymphatic:   Patient denies swollen glands and easy bruising.  Cardiovascular:   Patient reports leg swelling. Patient denies chest pains.  Respiratory:   Patient reports cough. Patient denies shortness of breath.  Endocrine:   Patient denies excessive thirst.  Musculoskeletal:   Patient reports back pain and joint pain.   Neurological:   Patient reports headaches. Patient denies dizziness.  Psychologic:   Patient denies depression and anxiety.   VITAL SIGNS:      07/08/2017 04:01 PM  Weight 349 lb / 158.3  kg  Height 65.5 in / 166.37 cm  BP 134/90 mmHg  Pulse 72 /min  Temperature 97.5 F / 36.3 C  BMI 57.2 kg/m   MULTI-SYSTEM PHYSICAL EXAMINATION:    Constitutional: Obese. No physical deformities. Normally developed. Good grooming.   Respiratory: Normal breath sounds. No labored breathing, no use of accessory muscles.   Cardiovascular: Regular rate and rhythm. No murmur, no gallop. Extremity swelling. Normal temperature, no varicosities.   Skin: No paleness, no jaundice, no cyanosis. No lesion, no ulcer, no rash.  Neurologic / Psychiatric: Oriented to time, oriented to place, oriented to person. No depression, no anxiety, no agitation.  Gastrointestinal: Obese abdomen. No mass, no tenderness, no rigidity.   Musculoskeletal: Spine, ribs, pelvis no bilateral tenderness. Normal gait and station of head and neck. In wheelchair today.      PAST DATA REVIEWED:  Source Of History:  Patient  Records Review:   Previous Patient Records  Urine Test Review:   Urinalysis, Urine Culture  X-Ray Review: C.T. Abdomen/Pelvis: Reviewed Films. Reviewed Report.     PROCEDURES: None   ASSESSMENT:      ICD-10 Details  1 GU:   Renal calculus - N20.0    PLAN:           Document Letter(s):  Created for Patient: Clinical Summary         Notes:   Patient was unable to leave urine specimen today. We discussed obtaining a catheterized specimen for UA; however, the patient declined this today. She is not currently symptomatic for any infectious process and has also been on antibiotics. I will discuss this with her urologist prior to her scheduled surgery tomorrow. We discussed that she remain off her Eliquis until instructed to restart it after the procedure. We again discussed her upcoming procedure. I answered all of her questions to the best of my ability. I'll plan to have her follow-up for her procedure, as scheduled.    * Signed by Azucena Fallen on 07/09/17 at 6:36 AM (EST)*     The information contained  in this medical record document is considered private and confidential patient information. This information can only be used for the medical diagnosis and/or medical services that are being provided by the patient's selected caregivers. This information can only be distributed outside of the patient's care if the patient agrees and signs waivers of authorization for this information to be sent to an outside source or route.  Addendum: I spoke with NP Ronal Fear about the patient and agree with her assessment and plan.

## 2017-07-09 NOTE — Op Note (Signed)
Preoperative diagnosis: Left renal stone Postoperative diagnosis: Left renal stone  Procedure: Cystoscopy, left retrograde pyelogram, left ureteroscopy with holmium laser lithotripsy, left ureteral stent placement  Surgeon: Junious Silk  Anesthesia: General  Indication for procedure: 71 year old African-American female with large left renal stone.  Given her body habitus and comorbidities it was felt a percutaneous approach would be difficult and high risk.  She was brought for a staged ureteroscopy.  Findings: On cystoscopy the bladder was unremarkable without stone or foreign body.  There was a small cyst near the left ureteral orifice.  Left retrograde pyelogram-this outlined a single ureter single collecting system unit.  The ureter was unremarkable.  The lower pole filled normally the renal pelvis filled normally.  The upper and middle pole more connected by one common infundibulum and this entire unit was filled with a large stone.  On ureteroscopy, the stone was in the upper to middle pole obstructing those calyces.  The stone stopped at the upper and middle pole infundibulum as it connected to the renal pelvis.  The lower pole was patent.  Description of procedure: After consent was obtained the patient brought to the operating room.  After adequate anesthesia she was placed in lithotomy position prepped and draped in the usual sterile fashion.  A timeout was performed to confirm the patient and procedure.  She received 3 g of cefazolin and then 2 g of Rocephin.  I did not pass the cystoscope into the Rocephin was running in.  The cystoscope was passed per urethra and the bladder inspected and noted to be unremarkable.  The left ureteral orifice was cannulated with a 6 [place] open-ended catheter.  Retrograde injection of contrast was performed.  A sensor wire was advanced and coiled in the collecting system.  I used the access sheath 35 cm, to place 2 wires.  I went adjacent to the Glidewire and  the access sheath passed easily.  The sheath went right up to the renal pelvis providing excellent drainage of the collecting system.  The system was inspected and the stone noted to stop at the infundibulum as it connected to the renal pelvis.  The stone filled the upper and middle poles.  I began at a setting of 0.2 and 50 and then went to 0.3 and 50.  The stone in the center was soft but toward the upper pole and middle pole is hard.  As I got toward the upper pole and toward the middle pole it open cavities where thick white paste drained.  This appeared to have fragmented about 95% of the stone.  There may still be some stone adherent to the upper calyx in the middle calyx but it was hard to angulate into these areas.  I thought it best at this point to leave a stent and come back for a second look as there were many fragments as well.  Therefore the access sheath was backed out with the ureteroscope and the ureter inspected and noted to be normal.  No significant stone fragment in the ureter or injury.  The Glidewire was backloaded on the cystoscope and a 6 x 26 cm stent was advanced.  Good coil was reconstituted in the renal pelvis and a good coil in the bladder.  The bladder was drained and the scope removed she was awakened taken to recovery room in stable condition.  Complications: None Blood loss: Minimal Specimens: None  Drains: 6 x 26 cm left ureteral stent  Disposition: Patient stable to PACU.  I  called and discussed the patient with her Sister Kennyth Lose at the patient's request.  I will set the patient up for another ureteroscopy in 2 or 3 weeks.

## 2017-07-10 LAB — URINE CULTURE

## 2017-07-11 ENCOUNTER — Other Ambulatory Visit: Payer: Self-pay | Admitting: Urology

## 2017-07-16 ENCOUNTER — Encounter (HOSPITAL_COMMUNITY): Payer: Self-pay

## 2017-07-16 DIAGNOSIS — M5137 Other intervertebral disc degeneration, lumbosacral region: Secondary | ICD-10-CM | POA: Diagnosis not present

## 2017-07-16 DIAGNOSIS — Z79899 Other long term (current) drug therapy: Secondary | ICD-10-CM | POA: Diagnosis not present

## 2017-07-16 DIAGNOSIS — M47817 Spondylosis without myelopathy or radiculopathy, lumbosacral region: Secondary | ICD-10-CM | POA: Diagnosis not present

## 2017-07-16 DIAGNOSIS — G894 Chronic pain syndrome: Secondary | ICD-10-CM | POA: Diagnosis not present

## 2017-07-16 DIAGNOSIS — Z79891 Long term (current) use of opiate analgesic: Secondary | ICD-10-CM | POA: Diagnosis not present

## 2017-07-16 DIAGNOSIS — M25569 Pain in unspecified knee: Secondary | ICD-10-CM | POA: Diagnosis not present

## 2017-07-16 NOTE — Patient Instructions (Addendum)
Janice Morrow  07/16/2017   Your procedure is scheduled on: Tuesday, Nov. 27, 2018   Report to [facility] Main  Entrance   Take [place]  elevators to 3rd floor to  [place] at 7:30 AM.    Call this number if you have problems the morning of surgery [phone]    Remember: ONLY 1 PERSON MAY GO WITH YOU TO SHORT STAY TO GET  READY MORNING OF [place].   Do not eat food or drink liquids :After Midnight.    Take these medicines the morning of surgery with A SIP OF WATER: Carvedilol (Coreg), Gabapentin,    Use asthma inhaler per normal routine morning of surgery and bring inhaler with you day of surgery   Bring CPAP mask and tubing only day of surgery              You may not have any metal on your body including hair pins, jewelry, and body piercings              Do not wear make-up, lotions, powders, perfumes, or deodorant             Do not wear nail polish.  Do not shave  48 hours prior to surgery.                Do not bring valuables to the hospital. [place].   Contacts, dentures or bridgework may not be worn into surgery.   Patients discharged the day of surgery will not be allowed to drive home.   Name and phone number of your driver:  Albertina Parr [phone]   Special Instructions: N/A              Please read over the following fact sheets you were given: _____________________________________________________________________             [facility] - Preparing for Surgery Before surgery, you can play an important role.  Because skin is not sterile, your skin needs to be as free of germs as possible.  You can reduce the number of germs on your skin by washing with CHG (chlorahexidine gluconate) soap before surgery.  CHG is an antiseptic cleaner which kills germs and bonds with the skin to continue killing germs even after washing. Please DO NOT use if you have an allergy to CHG or  antibacterial soaps.  If your skin becomes reddened/irritated stop using the CHG and inform your nurse when you arrive at Short Stay. Do not shave (including legs and underarms) for at least 48 hours prior to the first CHG shower.  You may shave your face/neck.  Please follow these instructions carefully:  1.  Shower with CHG Soap the night before surgery and the  morning of surgery.  2.  If you choose to wash your hair, wash your hair first as usual with your normal  shampoo.  3.  After you shampoo, rinse your hair and body thoroughly to remove the shampoo.                             4.  Use CHG as you would any other liquid soap.  You can apply chg directly to the skin and wash.  Gently with a  scrungie or clean washcloth.  5.  Apply the CHG Soap to your body ONLY FROM THE NECK DOWN.   Do   not use on face/ open                           Wound or open sores. Avoid contact with eyes, ears mouth and   genitals (private parts).                       Wash face,  Genitals (private parts) with your normal soap.             6.  Wash thoroughly, paying special attention to the area where your    surgery  will be performed.  7.  Thoroughly rinse your body with warm water from the neck down.  8.  DO NOT shower/wash with your normal soap after using and rinsing off the CHG Soap.                9.  Pat yourself dry with a clean towel.            10.  Wear clean pajamas.            11.  Place clean sheets on your bed the night of your first shower and do not  sleep with pets. Day of Surgery : Do not apply any lotions/deodorants the morning of surgery.  Please wear clean clothes to the hospital/surgery center.  FAILURE TO FOLLOW THESE INSTRUCTIONS MAY RESULT IN THE CANCELLATION OF YOUR SURGERY  PATIENT SIGNATURE_________________________________  NURSE SIGNATURE__________________________________  ________________________________________________________________________

## 2017-07-16 NOTE — Pre-Procedure Instructions (Signed)
The following are in epic: EKG 02/19/17 Echo 12/01/16 C-arm 07/09/17 US renal 05/28/17 Abd 05/28/17 CXR 02/19/17 CBC BMP 07/08/17 UA and urine culture 07/09/17

## 2017-07-19 ENCOUNTER — Other Ambulatory Visit: Payer: Self-pay | Admitting: Internal Medicine

## 2017-07-19 DIAGNOSIS — I519 Heart disease, unspecified: Secondary | ICD-10-CM

## 2017-07-19 DIAGNOSIS — I5189 Other ill-defined heart diseases: Secondary | ICD-10-CM

## 2017-07-19 DIAGNOSIS — I10 Essential (primary) hypertension: Secondary | ICD-10-CM

## 2017-07-22 ENCOUNTER — Other Ambulatory Visit: Payer: Self-pay

## 2017-07-22 ENCOUNTER — Encounter (HOSPITAL_COMMUNITY): Payer: Self-pay

## 2017-07-22 ENCOUNTER — Encounter (HOSPITAL_COMMUNITY)
Admission: RE | Admit: 2017-07-22 | Discharge: 2017-07-22 | Disposition: A | Discharge status: Home / Self Care | Payer: Medicare Other | Source: Ambulatory Visit | Attending: Urology | Admitting: Urology

## 2017-07-22 DIAGNOSIS — Z86711 Personal history of pulmonary embolism: Secondary | ICD-10-CM | POA: Diagnosis not present

## 2017-07-22 DIAGNOSIS — I10 Essential (primary) hypertension: Secondary | ICD-10-CM | POA: Diagnosis not present

## 2017-07-22 DIAGNOSIS — Z79899 Other long term (current) drug therapy: Secondary | ICD-10-CM | POA: Diagnosis not present

## 2017-07-22 DIAGNOSIS — G40909 Epilepsy, unspecified, not intractable, without status epilepticus: Secondary | ICD-10-CM | POA: Diagnosis not present

## 2017-07-22 DIAGNOSIS — N200 Calculus of kidney: Secondary | ICD-10-CM | POA: Diagnosis not present

## 2017-07-22 DIAGNOSIS — I739 Peripheral vascular disease, unspecified: Secondary | ICD-10-CM | POA: Diagnosis not present

## 2017-07-22 DIAGNOSIS — G4733 Obstructive sleep apnea (adult) (pediatric): Secondary | ICD-10-CM | POA: Diagnosis not present

## 2017-07-22 DIAGNOSIS — G500 Trigeminal neuralgia: Secondary | ICD-10-CM | POA: Diagnosis not present

## 2017-07-22 DIAGNOSIS — E119 Type 2 diabetes mellitus without complications: Secondary | ICD-10-CM | POA: Diagnosis not present

## 2017-07-22 DIAGNOSIS — Z87891 Personal history of nicotine dependence: Secondary | ICD-10-CM | POA: Diagnosis not present

## 2017-07-22 DIAGNOSIS — Z6841 Body Mass Index (BMI) 40.0 and over, adult: Secondary | ICD-10-CM | POA: Diagnosis not present

## 2017-07-22 DIAGNOSIS — E785 Hyperlipidemia, unspecified: Secondary | ICD-10-CM | POA: Diagnosis not present

## 2017-07-22 DIAGNOSIS — J449 Chronic obstructive pulmonary disease, unspecified: Secondary | ICD-10-CM | POA: Diagnosis not present

## 2017-07-22 HISTORY — DX: Other pulmonary embolism without acute cor pulmonale: I26.99

## 2017-07-22 HISTORY — DX: Personal history of urinary calculi: Z87.442

## 2017-07-22 LAB — BASIC METABOLIC PANEL
ANION GAP: 6 (ref 5–15)
BUN: 22 mg/dL — AB (ref 6–20)
CHLORIDE: 103 mmol/L (ref 101–111)
CO2: 27 mmol/L (ref 22–32)
Calcium: 9.2 mg/dL (ref 8.9–10.3)
Creatinine, Ser: 1.3 mg/dL — ABNORMAL HIGH (ref 0.44–1.00)
GFR calc Af Amer: 47 mL/min — ABNORMAL LOW (ref >60)
GFR calc non Af Amer: 41 mL/min — ABNORMAL LOW (ref >60)
GLUCOSE: 129 mg/dL — AB (ref 65–99)
POTASSIUM: 4.6 mmol/L (ref 3.5–5.1)
Sodium: 136 mmol/L (ref 135–145)

## 2017-07-22 LAB — GLUCOSE, CAPILLARY: Glucose-Capillary: 137 mg/dL — ABNORMAL HIGH (ref 65–99)

## 2017-07-22 LAB — CBC
HEMATOCRIT: 48.1 % — AB (ref 36.0–46.0)
HEMOGLOBIN: 15.1 g/dL — AB (ref 12.0–15.0)
MCH: 28.2 pg (ref 26.0–34.0)
MCHC: 31.4 g/dL (ref 30.0–36.0)
MCV: 89.7 fL (ref 78.0–100.0)
Platelets: 201 10*3/uL (ref 150–400)
RBC: 5.36 MIL/uL — ABNORMAL HIGH (ref 3.87–5.11)
RDW: 14.9 % (ref 11.5–15.5)
WBC: 8.4 10*3/uL (ref 4.0–10.5)

## 2017-07-22 NOTE — Pre-Procedure Instructions (Signed)
CBC and BMP results faxed to Dr. Junious Silk via epic.

## 2017-07-23 ENCOUNTER — Ambulatory Visit (HOSPITAL_COMMUNITY): Payer: Medicare Other

## 2017-07-23 ENCOUNTER — Other Ambulatory Visit: Payer: Self-pay

## 2017-07-23 ENCOUNTER — Encounter (HOSPITAL_COMMUNITY): Payer: Self-pay | Admitting: *Deleted

## 2017-07-23 ENCOUNTER — Encounter (HOSPITAL_COMMUNITY)
Admission: RE | Disposition: A | Discharge status: Home / Self Care | Payer: Self-pay | Source: Ambulatory Visit | Attending: Urology

## 2017-07-23 ENCOUNTER — Ambulatory Visit (HOSPITAL_COMMUNITY)
Admission: RE | Admit: 2017-07-23 | Discharge: 2017-07-23 | Disposition: A | Discharge status: Home / Self Care | Payer: Medicare Other | Source: Ambulatory Visit | Attending: Urology | Admitting: Urology

## 2017-07-23 ENCOUNTER — Ambulatory Visit (HOSPITAL_COMMUNITY): Payer: Medicare Other | Admitting: Registered Nurse

## 2017-07-23 DIAGNOSIS — G500 Trigeminal neuralgia: Secondary | ICD-10-CM | POA: Diagnosis not present

## 2017-07-23 DIAGNOSIS — Z6841 Body Mass Index (BMI) 40.0 and over, adult: Secondary | ICD-10-CM | POA: Insufficient documentation

## 2017-07-23 DIAGNOSIS — I739 Peripheral vascular disease, unspecified: Secondary | ICD-10-CM | POA: Diagnosis not present

## 2017-07-23 DIAGNOSIS — Z79899 Other long term (current) drug therapy: Secondary | ICD-10-CM | POA: Insufficient documentation

## 2017-07-23 DIAGNOSIS — G40909 Epilepsy, unspecified, not intractable, without status epilepticus: Secondary | ICD-10-CM | POA: Diagnosis not present

## 2017-07-23 DIAGNOSIS — E119 Type 2 diabetes mellitus without complications: Secondary | ICD-10-CM | POA: Insufficient documentation

## 2017-07-23 DIAGNOSIS — J449 Chronic obstructive pulmonary disease, unspecified: Secondary | ICD-10-CM | POA: Diagnosis not present

## 2017-07-23 DIAGNOSIS — N200 Calculus of kidney: Secondary | ICD-10-CM | POA: Diagnosis not present

## 2017-07-23 DIAGNOSIS — E785 Hyperlipidemia, unspecified: Secondary | ICD-10-CM | POA: Diagnosis not present

## 2017-07-23 DIAGNOSIS — I10 Essential (primary) hypertension: Secondary | ICD-10-CM | POA: Insufficient documentation

## 2017-07-23 DIAGNOSIS — Z86711 Personal history of pulmonary embolism: Secondary | ICD-10-CM | POA: Diagnosis not present

## 2017-07-23 DIAGNOSIS — Z87891 Personal history of nicotine dependence: Secondary | ICD-10-CM | POA: Insufficient documentation

## 2017-07-23 DIAGNOSIS — G4733 Obstructive sleep apnea (adult) (pediatric): Secondary | ICD-10-CM | POA: Insufficient documentation

## 2017-07-23 DIAGNOSIS — N3001 Acute cystitis with hematuria: Secondary | ICD-10-CM | POA: Diagnosis not present

## 2017-07-23 HISTORY — PX: URETEROSCOPY WITH HOLMIUM LASER LITHOTRIPSY: SHX6645

## 2017-07-23 LAB — GLUCOSE, CAPILLARY: GLUCOSE-CAPILLARY: 115 mg/dL — AB (ref 65–99)

## 2017-07-23 SURGERY — URETEROSCOPY, WITH LITHOTRIPSY USING HOLMIUM LASER
Anesthesia: General | Laterality: Left

## 2017-07-23 MED ORDER — SODIUM CHLORIDE 0.9 % IR SOLN
Status: DC | PRN
  Administered 2017-07-23: 3000 mL

## 2017-07-23 MED ORDER — LEVALBUTEROL HCL 1.25 MG/0.5ML IN NEBU
INHALATION_SOLUTION | RESPIRATORY_TRACT | Status: AC
  Filled 2017-07-23: qty 0.5

## 2017-07-23 MED ORDER — LIDOCAINE 2% (20 MG/ML) 5 ML SYRINGE
INTRAMUSCULAR | Status: AC
  Filled 2017-07-23: qty 5

## 2017-07-23 MED ORDER — MIDAZOLAM HCL 5 MG/5ML IJ SOLN
INTRAMUSCULAR | Status: DC | PRN
  Administered 2017-07-23: 1 mg via INTRAVENOUS

## 2017-07-23 MED ORDER — ONDANSETRON HCL 4 MG/2ML IJ SOLN
INTRAMUSCULAR | Status: AC
  Filled 2017-07-23: qty 2

## 2017-07-23 MED ORDER — ONDANSETRON HCL 4 MG/2ML IJ SOLN
INTRAMUSCULAR | Status: DC | PRN
  Administered 2017-07-23: 4 mg via INTRAVENOUS

## 2017-07-23 MED ORDER — LIDOCAINE 2% (20 MG/ML) 5 ML SYRINGE
INTRAMUSCULAR | Status: DC | PRN
  Administered 2017-07-23: 100 mg via INTRAVENOUS

## 2017-07-23 MED ORDER — DEXTROSE 5 % IV SOLN
INTRAVENOUS | Status: AC
  Filled 2017-07-23: qty 2

## 2017-07-23 MED ORDER — MIDAZOLAM HCL 2 MG/2ML IJ SOLN
INTRAMUSCULAR | Status: AC
  Filled 2017-07-23: qty 2

## 2017-07-23 MED ORDER — ALBUTEROL SULFATE (2.5 MG/3ML) 0.083% IN NEBU
INHALATION_SOLUTION | RESPIRATORY_TRACT | Status: AC
  Filled 2017-07-23: qty 3

## 2017-07-23 MED ORDER — ROCURONIUM BROMIDE 10 MG/ML (PF) SYRINGE
PREFILLED_SYRINGE | INTRAVENOUS | Status: DC | PRN
  Administered 2017-07-23: 50 mg via INTRAVENOUS

## 2017-07-23 MED ORDER — PHENYLEPHRINE 40 MCG/ML (10ML) SYRINGE FOR IV PUSH (FOR BLOOD PRESSURE SUPPORT)
PREFILLED_SYRINGE | INTRAVENOUS | Status: AC
  Filled 2017-07-23: qty 10

## 2017-07-23 MED ORDER — DEXAMETHASONE SODIUM PHOSPHATE 10 MG/ML IJ SOLN
INTRAMUSCULAR | Status: AC
  Filled 2017-07-23: qty 1

## 2017-07-23 MED ORDER — NITROFURANTOIN MACROCRYSTAL 100 MG PO CAPS: 100.0000 mg | ORAL_CAPSULE | Freq: Every day | ORAL | 0 refills | Status: AC

## 2017-07-23 MED ORDER — ROCURONIUM BROMIDE 50 MG/5ML IV SOSY
PREFILLED_SYRINGE | INTRAVENOUS | Status: AC
  Filled 2017-07-23: qty 5

## 2017-07-23 MED ORDER — PROMETHAZINE HCL 25 MG/ML IJ SOLN: 6.2500 mg | INTRAMUSCULAR | Status: DC | PRN

## 2017-07-23 MED ORDER — PHENYLEPHRINE 40 MCG/ML (10ML) SYRINGE FOR IV PUSH (FOR BLOOD PRESSURE SUPPORT)
PREFILLED_SYRINGE | INTRAVENOUS | Status: DC | PRN
  Administered 2017-07-23: 40 ug via INTRAVENOUS

## 2017-07-23 MED ORDER — HYDROMORPHONE HCL 1 MG/ML IJ SOLN: 0.2500 mg | INTRAMUSCULAR | Status: DC | PRN

## 2017-07-23 MED ORDER — OXYCODONE HCL 5 MG PO TABS: 5.0000 mg | ORAL_TABLET | Freq: Once | ORAL | Status: DC | PRN

## 2017-07-23 MED ORDER — DEXAMETHASONE SODIUM PHOSPHATE 10 MG/ML IJ SOLN
INTRAMUSCULAR | Status: DC | PRN
  Administered 2017-07-23: 10 mg via INTRAVENOUS

## 2017-07-23 MED ORDER — DEXTROSE 5 % IV SOLN
2.0000 g | Freq: Once | INTRAVENOUS | Status: AC
  Administered 2017-07-23: 2 g via INTRAVENOUS

## 2017-07-23 MED ORDER — PROPOFOL 10 MG/ML IV BOLUS
INTRAVENOUS | Status: AC
  Filled 2017-07-23: qty 40

## 2017-07-23 MED ORDER — FENTANYL CITRATE (PF) 100 MCG/2ML IJ SOLN
INTRAMUSCULAR | Status: DC | PRN
  Administered 2017-07-23: 50 ug via INTRAVENOUS

## 2017-07-23 MED ORDER — ALBUTEROL SULFATE (2.5 MG/3ML) 0.083% IN NEBU
5.0000 mg | INHALATION_SOLUTION | Freq: Once | RESPIRATORY_TRACT | Status: AC
  Administered 2017-07-23: 5 mg via RESPIRATORY_TRACT

## 2017-07-23 MED ORDER — SUGAMMADEX SODIUM 500 MG/5ML IV SOLN
INTRAVENOUS | Status: DC | PRN
  Administered 2017-07-23: 315 mg via INTRAVENOUS

## 2017-07-23 MED ORDER — OXYCODONE HCL 5 MG/5ML PO SOLN: 5.0000 mg | Freq: Once | ORAL | Status: DC | PRN

## 2017-07-23 MED ORDER — SUGAMMADEX SODIUM 200 MG/2ML IV SOLN
INTRAVENOUS | Status: AC
  Filled 2017-07-23: qty 2

## 2017-07-23 MED ORDER — TRAMADOL HCL 50 MG PO TABS: 50.0000 mg | ORAL_TABLET | Freq: Four times a day (QID) | ORAL | 0 refills | Status: DC | PRN

## 2017-07-23 MED ORDER — LACTATED RINGERS IV SOLN
INTRAVENOUS | Status: DC
  Administered 2017-07-23: 08:00:00 via INTRAVENOUS

## 2017-07-23 MED ORDER — SODIUM CHLORIDE 0.9 % IR SOLN
Status: DC | PRN
  Administered 2017-07-23: 2000 mL

## 2017-07-23 MED ORDER — PROPOFOL 10 MG/ML IV BOLUS
INTRAVENOUS | Status: DC | PRN
  Administered 2017-07-23: 250 mg via INTRAVENOUS

## 2017-07-23 MED ORDER — FENTANYL CITRATE (PF) 100 MCG/2ML IJ SOLN
INTRAMUSCULAR | Status: AC
  Filled 2017-07-23: qty 2

## 2017-07-23 SURGICAL SUPPLY — 24 items
BAG URO CATCHER STRL LF (MISCELLANEOUS) ×3 IMPLANT
BASKET LASER NITINOL 1.9FR (BASKET) IMPLANT
BASKET STNLS GEMINI 4WIRE 3FR (BASKET) IMPLANT
BASKET ZERO TIP NITINOL 2.4FR (BASKET) IMPLANT
CATH INTERMIT  6FR 70CM (CATHETERS) ×3 IMPLANT
CLOTH BEACON ORANGE TIMEOUT ST (SAFETY) ×3 IMPLANT
COVER FOOTSWITCH UNIV (MISCELLANEOUS) ×3 IMPLANT
COVER SURGICAL LIGHT HANDLE (MISCELLANEOUS) IMPLANT
EXTRACTOR STONE NITINOL NGAGE (UROLOGICAL SUPPLIES) ×3 IMPLANT
FIBER LASER FLEXIVA 1000 (UROLOGICAL SUPPLIES) IMPLANT
FIBER LASER FLEXIVA 365 (UROLOGICAL SUPPLIES) IMPLANT
FIBER LASER FLEXIVA 550 (UROLOGICAL SUPPLIES) IMPLANT
FIBER LASER TRAC TIP (UROLOGICAL SUPPLIES) ×3 IMPLANT
GLOVE BIOGEL M STRL SZ7.5 (GLOVE) ×3 IMPLANT
GOWN STRL REUS W/TWL XL LVL3 (GOWN DISPOSABLE) ×3 IMPLANT
GUIDEWIRE ANG ZIPWIRE 038X150 (WIRE) ×3 IMPLANT
GUIDEWIRE STR DUAL SENSOR (WIRE) ×3 IMPLANT
IV NS IRRIG 3000ML ARTHROMATIC (IV SOLUTION) IMPLANT
PACK CYSTO (CUSTOM PROCEDURE TRAY) ×3 IMPLANT
SHEATH ACCESS URETERAL 24CM (SHEATH) ×3 IMPLANT
SHEATH ACCESS URETERAL 54CM (SHEATH) IMPLANT
SHEATH URETERAL 12FRX35CM (MISCELLANEOUS) ×3 IMPLANT
STENT CONTOUR 6FRX26X.038 (STENTS) ×3 IMPLANT
SYRINGE IRR TOOMEY STRL 70CC (SYRINGE) IMPLANT

## 2017-07-23 NOTE — Anesthesia Procedure Notes (Signed)
Procedure Name: Intubation Date/Time: 07/23/2017 10:35 AM Performed by: Victoriano Lain, CRNA Pre-anesthesia Checklist: Patient identified, Emergency Drugs available, Suction available, Patient being monitored and Timeout performed Patient Re-evaluated:Patient Re-evaluated prior to induction Oxygen Delivery Method: Circle system utilized Preoxygenation: Pre-oxygenation with 100% oxygen Induction Type: IV induction Ventilation: Mask ventilation without difficulty Laryngoscope Size: Mac and 4 Grade View: Grade I Tube type: Oral Tube size: 7.5 mm Number of attempts: 1 Airway Equipment and Method: Stylet Placement Confirmation: ETT inserted through vocal cords under direct vision,  positive ETCO2 and breath sounds checked- equal and bilateral Secured at: 21 cm Tube secured with: Tape Dental Injury: Teeth and Oropharynx as per pre-operative assessment

## 2017-07-23 NOTE — Interval H&P Note (Signed)
History and Physical Interval Note:  07/23/2017 9:30 AM  Janice Morrow  has presented today for surgery, with the diagnosis of LEFT RENAL STONE  The various methods of treatment have been discussed with the patient and family. After consideration of risks, benefits and other options for treatment, the patient has consented to  Procedure(s) with comments: URETEROSCOPY WITH HOLMIUM LASER LITHOTRIPSY /STENT (Left) - NEEDS 60 MINUTES FOR PROCEDURE as a surgical intervention .  The patient's history has been reviewed, patient examined, no change in status, stable for surgery.  I have reviewed the patient's chart and labs.  Questions were answered to the patient's satisfaction.  She's been on NF and had no dysuria or fever. Occasional gross hematuria. Discussed possible staged procedure and stent exchange.    Festus Aloe

## 2017-07-23 NOTE — Anesthesia Postprocedure Evaluation (Signed)
Anesthesia Post Note  Patient: Janice Morrow  Procedure(s) Performed: URETEROSCOPY WITH HOLMIUM LASER LITHOTRIPSY /STENT (Left )     Patient location during evaluation: PACU Anesthesia Type: General Level of consciousness: awake and alert Pain management: pain level controlled Vital Signs Assessment: post-procedure vital signs reviewed and stable Respiratory status: spontaneous breathing, nonlabored ventilation and respiratory function stable Cardiovascular status: blood pressure returned to baseline and stable Postop Assessment: no apparent nausea or vomiting Anesthetic complications: no    Last Vitals:  Vitals:   07/23/17 1339 07/23/17 1345  BP:    Pulse: 62   Resp:    Temp:    SpO2: 94% 96%    Last Pain:  Vitals:   07/23/17 0724  TempSrc: Oral                 Lynda Rainwater

## 2017-07-23 NOTE — Transfer of Care (Signed)
Immediate Anesthesia Transfer of Care Note  Patient: Janice Morrow  Procedure(s) Performed: URETEROSCOPY WITH HOLMIUM LASER LITHOTRIPSY /STENT (Left )  Patient Location: PACU  Anesthesia Type:General  Level of Consciousness: awake, alert , oriented and patient cooperative  Airway & Oxygen Therapy: Patient Spontanous Breathing and Patient connected to face mask oxygen  Post-op Assessment: Report given to RN, Post -op Vital signs reviewed and stable and Patient moving all extremities  Post vital signs: Reviewed and stable  Last Vitals:  Vitals:   07/23/17 0724  BP: (!) 137/94  Pulse: 88  Resp: 20  Temp: 36.8 C  SpO2: 96%    Last Pain:  Vitals:   07/23/17 0724  TempSrc: Oral      Patients Stated Pain Goal: 4 (18/59/09 3112)  Complications: No apparent anesthesia complications

## 2017-07-23 NOTE — Anesthesia Preprocedure Evaluation (Signed)
Anesthesia Evaluation  Patient identified by MRN, date of birth, ID band Patient awake    Reviewed: Allergy & Precautions, NPO status , Patient's Chart, lab work & pertinent test results, reviewed documented beta blocker date and time   History of Anesthesia Complications Negative for: history of anesthetic complications  Airway Mallampati: II  TM Distance: >3 FB Neck ROM: Full    Dental  (+) Edentulous Upper, Edentulous Lower   Pulmonary sleep apnea and Continuous Positive Airway Pressure Ventilation , COPD,  COPD inhaler, former smoker, PE   breath sounds clear to auscultation       Cardiovascular hypertension, Pt. on medications and Pt. on home beta blockers + Peripheral Vascular Disease   Rhythm:Regular Rate:Normal  4/18 ECHO: LVEF 55-60%, impaired relaxation (Grade 1), valves OK   Neuro/Psych  Headaches, Seizures -, Well Controlled,  Trigeminal neuralgia    GI/Hepatic negative GI ROS, Neg liver ROS,   Endo/Other  diabetes (no meds, diet control)Morbid obesity  Renal/GU Renal InsufficiencyRenal disease (creat 1.33)     Musculoskeletal  (+) Arthritis ,   Abdominal (+) + obese,   Peds  Hematology Eliquis: last dose Friday   Anesthesia Other Findings   Reproductive/Obstetrics                             Anesthesia Physical  Anesthesia Plan  ASA: III  Anesthesia Plan: General   Post-op Pain Management:    Induction: Intravenous  PONV Risk Score and Plan: 3 and Ondansetron, Dexamethasone and Midazolam  Airway Management Planned: Oral ETT  Additional Equipment:   Intra-op Plan:   Post-operative Plan: Extubation in OR  Informed Consent: I have reviewed the patients History and Physical, chart, labs and discussed the procedure including the risks, benefits and alternatives for the proposed anesthesia with the patient or authorized representative who has indicated his/her  understanding and acceptance.   Dental advisory given  Plan Discussed with: CRNA and Surgeon  Anesthesia Plan Comments: (Plan routine monitors, GETA)        Anesthesia Quick Evaluation

## 2017-07-23 NOTE — Discharge Instructions (Signed)
Ureteral Stent Implantation, Care After Refer to this sheet in the next few weeks. These instructions provide you with information about caring for yourself after your procedure. Your health care provider may also give you more specific instructions. Your treatment has been planned according to current medical practices, but problems sometimes occur. Call your health care provider if you have any problems or questions after your procedure.  Remove the stent on Monday morning July 29, 2017 as instructed.  Pulled the string slowly.  What can I expect after the procedure? After the procedure, it is common to have:  Nausea.  Mild pain when you urinate. You may feel this pain in your lower back or lower abdomen. Pain should stop within a few minutes after you urinate. This may last for up to 1 week.  A small amount of blood in your urine for several days.  Follow these instructions at home:  Medicines  Take over-the-counter and prescription medicines only as told by your health care provider.  If you were prescribed an antibiotic medicine, take it as told by your health care provider. Do not stop taking the antibiotic even if you start to feel better.  Do not drive for 24 hours if you received a sedative.  Do not drive or operate heavy machinery while taking prescription pain medicines. Activity  Return to your normal activities as told by your health care provider. Ask your health care provider what activities are safe for you.  Do not lift anything that is heavier than 10 lb (4.5 kg). Follow this limit for 1 week after your procedure, or for as long as told by your health care provider. General instructions  Watch for any blood in your urine. Call your health care provider if the amount of blood in your urine increases.  If you have a catheter: ? Follow instructions from your health care provider about taking care of your catheter and collection bag. ? Do not take baths, swim, or  use a hot tub until your health care provider approves.  Drink enough fluid to keep your urine clear or pale yellow.  Keep all follow-up visits as told by your health care provider. This is important. Contact a health care provider if:  You have pain that gets worse or does not get better with medicine, especially pain when you urinate.  You have difficulty urinating.  You feel nauseous or you vomit repeatedly during a period of more than 2 days after the procedure. Get help right away if:  Your urine is dark red or has blood clots in it.  You are leaking urine (have incontinence).  The end of the stent comes out of your urethra.  You cannot urinate.  You have sudden, sharp, or severe pain in your abdomen or lower back.  You have a fever. This information is not intended to replace advice given to you by your health care provider. Make sure you discuss any questions you have with your health care provider. Document Released: 04/15/2013 Document Revised: 01/19/2016 Document Reviewed: 02/25/2015 Elsevier Interactive Patient Education  2018 [place] Anesthesia, Adult, Care After These instructions provide you with information about caring for yourself after your procedure. Your health care provider may also give you more specific instructions. Your treatment has been planned according to current medical practices, but problems sometimes occur. Call your health care provider if you have any problems or questions after your procedure. What can I expect after the procedure? After the  procedure, it is common to have:  Vomiting.  A sore throat.  Mental slowness.  It is common to feel:  Nauseous.  Cold or shivery.  Sleepy.  Tired.  Sore or achy, even in parts of your body where you did not have surgery.  Follow these instructions at home: For at least 24 hours after the procedure:  Do not: ? Participate in activities where you could fall or become  injured. ? Drive. ? Use heavy machinery. ? Drink alcohol. ? Take sleeping pills or medicines that cause drowsiness. ? Make important decisions or sign legal documents. ? Take care of children on your own.  Rest. Eating and drinking  If you vomit, drink water, juice, or soup when you can drink without vomiting.  Drink enough fluid to keep your urine clear or pale yellow.  Make sure you have little or no nausea before eating solid foods.  Follow the diet recommended by your health care provider. General instructions  Have a responsible adult stay with you until you are awake and alert.  Return to your normal activities as told by your health care provider. Ask your health care provider what activities are safe for you.  Take over-the-counter and prescription medicines only as told by your health care provider.  If you smoke, do not smoke without supervision.  Keep all follow-up visits as told by your health care provider. This is important. Contact a health care provider if:  You continue to have nausea or vomiting at home, and medicines are not helpful.  You cannot drink fluids or start eating again.  You cannot urinate after 8-12 hours.  You develop a skin rash.  You have fever.  You have increasing redness at the site of your procedure. Get help right away if:  You have difficulty breathing.  You have chest pain.  You have unexpected bleeding.  You feel that you are having a life-threatening or urgent problem. This information is not intended to replace advice given to you by your health care provider. Make sure you discuss any questions you have with your health care provider. Document Released: 11/19/2000 Document Revised: 01/16/2016 Document Reviewed: 07/28/2015 Elsevier Interactive Patient Education  [employer].

## 2017-07-23 NOTE — Progress Notes (Signed)
PACU NSG NOTE: pt in PACU, pt has orders for DC to home. Attempting to wean off nasal cannula. Pt placed initially on 4lpm then decreased to 2lpm. Placed on RA with cont ETCO2 monitoring. ETCO2 monitoring results remain constant. Pt sats decreased into the 70's. Pt immediately placed back on 2lpm via [place], HOB raised to [place], pt instructed to cough and deep breath. POX increased to the 80's and now noted at 94% on 2lpm via [place]. MDA informed of event. Will cont to monitor in Phase I PACU until patient able to remain on RA with acceptable sats prior to tx to Phase II PACU

## 2017-07-23 NOTE — Op Note (Signed)
Preoperative diagnosis: Left renal stone Postoperative diagnosis: Left renal stone  Procedure: Cystoscopy, left ureteroscopy, holmium laser lithotripsy, stone basket extraction, stent exchange  Surgeon: Junious Silk  Anesthesia: General  Indication for procedure: 71 year old African-American female with significant stone burden of the left upper pole.  She was brought for staged ureteroscopy.  She underwent ureteroscopy with lithotripsy 2 weeks ago.  Findings: Multiple small fragments in the upper mid and lower pole.  Some areas in the upper and middle pole of mucosal breakdown possibly from prior stone burden and infection.  Lots of thick grime/mucus washed out of kidney. Description of procedure: After consent was obtained the patient brought to the operating room.  After adequate anesthesia She was placed in lithotomy position and prepped and draped in the usual sterile fashion.  A timeout was performed to confirm the patient and procedure.  The cystoscope was passed per urethra and the left stent grasped.  It was removed through the urethral meatus and a sensor wire was advanced.  I then tried to pass the access sheath but it was held up at the iliacs.  I used the access sheath to get a second Glidewire placed.  I then put the digital ureteroscope over 1 of the wires to inspect the ureter and it was free of stone or injury and no stenosis.  Therefore I repassed the medium access sheath and it still would not go, therefore was switched to the shorter access sheath which was the other brand and had a smoother taper.  This went without difficulty.  This left the Glidewire as the safety wire.  The digital ureteroscope was advanced into the kidney and the upper pole toward the midpole infundibulum there was a large stone fragment stuck on the wall or at the entrance to an infundibulum.  It was fragmented at 0.3 and 50 and broke off.  Any significant fragments in the upper and middle pole were withdrawn with  the engage basket.  A lot of fragments had fallen into the middle pole calyx and down into the lower pole calyx and these were sequentially taken out with the engage basket.  No significant stone burden remained.  Also several pieces of gray grime/mucus was withdrawn and a lot of this washed out.  At the end the collecting system did not appear to have any significant stone fragments and no fragments were noted on fluoroscopy.  Final inspection of the collecting system to flush it out noted it to be clean appearing with no significant stone fragment, the renal pelvis appeared normal, proximal ureter was normal and the access sheath and ureteroscope were backed out together.  The remainder of the ureter appeared normal without fragment or injury.  Much of the stone debris washed out under the drape.  The largest fragments were sent for analysis.  The Glidewire was backloaded on the cystoscope and a 624 cm was advanced.  The wire was removed with a good coil seen in the kidney and a good coil in the bladder.  The bladder was drained and the scope removed.  She was awakened and taken the recovery room in stable condition.  Complications: None  Blood loss: Minimal  Specimens: Stone fragments office lab  Drains: 6 x 24 cm left ureteral stent with string

## 2017-07-24 ENCOUNTER — Other Ambulatory Visit: Payer: Self-pay

## 2017-07-24 NOTE — Patient Outreach (Addendum)
[facility]) Care Management  07/24/2017  Janice Morrow 1945/11/22 [id number]    2nd Telephone call to patient for screening.  No answer. HIPAA compliant voice message left.  Plan: RN Health Coach will attempt outreach to patient in one business day.  Lazaro Arms RN, BSN, [place] Direct Dial:  [phone] Fax: [phone]

## 2017-07-25 ENCOUNTER — Other Ambulatory Visit: Payer: Self-pay

## 2017-07-25 NOTE — Patient Outreach (Signed)
[facility]) Care Management  07/25/2017  Janice Morrow October 29, 1945 [id number]    3rd Telephone call to patient for  screening.  No answer.  HIPAA complaint voice message left with contact information.     Plan: RN Health Coach will send letter to attempt outreach.  If no response within ten business days will proceed with case closure.    Lazaro Arms RN, BSN, [place] Direct Dial:  [phone] Fax: [phone]

## 2017-07-29 ENCOUNTER — Other Ambulatory Visit: Payer: Self-pay

## 2017-07-29 ENCOUNTER — Encounter: Payer: Self-pay | Admitting: *Deleted

## 2017-07-29 DIAGNOSIS — N200 Calculus of kidney: Secondary | ICD-10-CM | POA: Diagnosis not present

## 2017-07-29 NOTE — Patient Outreach (Signed)
[facility]) Care Management  07/29/2017  Janice Morrow 1946/02/06 [id number]   SCREENING Referral Date: 06/26/2017 Referral Source: Primary Care Office Referral Reason: Patient has limited support with transportation needs   Outreach Attempt: Patient returning call after receiving unsuccessful letter and 10 day waiting period   Social: The patient lives with her brother Janice Morrow.  She is able to perform her ADLS but needs help with her IADLS. The patient states that she has problems with her transportation because her brothers vehicle will over heat if it travels to far and her sister has to travel a long way to take her for office visits.  She states that she has put in an application with [employer] and the  Application should have been mailed in today. The patient has a hard time with affording groceries and she is not happy with her living situation. She is living in a long term stay hotel and has no privacy to herself. The patient has difficult time walking and she is using a cane. She would like to have [facility] services to help her.  RN [facility] advised the patient to call the nurse at her PCP's office and ask them  to write an order for her. Patient verbalized understanding.   Conditions: Per the chart the patient has  HTN, COPD, OSA, GERD, Type 2 Diabetes and Chronic back pain.   Medications: The patient states that she is on five medications and does not not need any assistance with purchasing them,.   Appointments:  The patient has an appointment scheduled with [facility] on 08/23/2017   Advanced Directives: The patient states that she has a living will and will give a copy of it to her primary care.   Consent: HIPAA verified with patient. Discussed and offered [facility] care management services. Patient verbally agreed to services.     Plan: RN [facility] will send referral to social work.  Lazaro Arms RN, BSN, [place] Direct Dial:  [phone] Fax: [phone]

## 2017-07-30 ENCOUNTER — Other Ambulatory Visit: Payer: Self-pay | Admitting: *Deleted

## 2017-07-30 ENCOUNTER — Telehealth: Payer: Self-pay | Admitting: *Deleted

## 2017-07-30 NOTE — Patient Outreach (Signed)
[facility]) Care Management  07/30/2017  Janice Morrow [birth year] [id number]   CSW made an initial attempt to try and contact patient today to perform phone assessment, as well as assess and assist with social needs and services, without success.  A HIPAA compliant message was left for patient on voicemail.  CSW is currently awaiting a return call. CSW will make a second outreach attempt within the next week, if CSW does not receive a return call from patient in the meantime. Nat Christen, BSW, MSW, LCSW  Licensed Education officer, [facility]  Mailing [place] [address] [place], [place] [address] Physical Address-[address], [place], [place] [address] Toll Free Main # [phone] Fax # [phone] Cell # [phone]  Office # [phone] [email or web address]

## 2017-07-30 NOTE — Telephone Encounter (Signed)
Called patient and LVM to inform that the [employer] documents have been faxed. Stated that nurse will mail back to the  patient her application. Nurse explained that the patient would need to call [employer] and set up a face to face interview before the application process will be completed. Also asked patient to call nurse back to confirm that she would prefer to have rollator walker and manual wheelchair instead of electric wheelchair.

## 2017-07-31 ENCOUNTER — Encounter: Payer: Self-pay | Admitting: *Deleted

## 2017-07-31 ENCOUNTER — Other Ambulatory Visit: Payer: Self-pay | Admitting: *Deleted

## 2017-07-31 NOTE — Patient Outreach (Signed)
[facility]) Care Management  07/31/2017  Janice Morrow 10/02/1945 [id number]   A return call was received from patient today, in response to the HIPAA compliant message left for patient on voicemail last evening by CSW.  CSW was able to perform the initial phone assessment, as well as assess and assist with social work needs and services.  CSW introduced self, explained role and types of services provided through [place] [facility] ([place] [facility]).  CSW further explained to patient that CSW works with patient's Telephonic RNCM, also with [place] [facility], [place]. CSW then explained the reason for the call, indicating that Mrs. Janice Morrow thought that patient would benefit from social work services and resources to assist with obtaining housing resources, as well as food resources.  CSW obtained two HIPAA compliant identifiers from patient, which included patient's name and date of birth. Patient was extremely talkative, blurting out all the different services that she will need, which include all of the following: Housing Armed forces training and education officer services In-home care providers Dental services CSW was able to get patient to focus on one particular service at a time, first discussing housing resources.  Patient would very much like to move into Section 8 Housing, but realizes that they are currently on a two-year waiting list and not accepting new applications.  This holds true for [employer], [employer] for [employer], Section 8 Housing, all agencies that fall under the [employer]. Patient reported that she already receives $136.00 per month in [place] through the [place].  Patient has tried to have the amount increased but was told that she is at the maximum amount for a household of one.  Patient was under the impression that CSW would be able  to call her Case Worker at the [place] and have the amount increased.  CSW politely explained to patient that CSW does not have any clout with the [place], not being able to override her case workers decision.  CSW agreed to mail patient the following information: [facility] in [place] in [place] Patient would like to have a nurse come out to her home at least once per month to check vitals, etc. CSW agreed to place an order for patient to receive a nurse case manager through [place] [facility] to provide disease management services.  CSW wanted this service to begin as soon as possible.  CSW explained to patient that the nurse assigned to patient's case has 10 business days to make the initial outreach call.  Patient voiced understanding and was agreeable to this time-frame. Patient reported that she needs transportation to and from her physician appointments, currently having to cancel appointments due to not having transportation services in place.  CSW spoke with patient at length about Medicaid Transportation, as patient is currently an eligible recipient.  Patient denied wanting this type of transportation, having had a bad experience when utilizing them in the past.  CSW then spoke with patient about transportation services through [employer] [facility]) through [employer]  Patient was agreeable to having CSW complete an application on her behalf and submit directly to [employer] for processing.  Again, patient has been informed that she will be notified by a representative from [place] within the  next 10 business days to perform a phone interview. Patient would like additional in-home care services, despite currently living with her brother, Janice Morrow and having him  provide 24 hour care and supervision to patient.  [name] is also assisting patient with activities of daily living, in addition to performing light housekeeping duties, meal preparation, grocery shopping, etc.  Because patient is an Adult Medicaid recipient, patient may be eligible to receive CAPS Forensic scientist) through the [place] or [employer] (Publishing rights manager) through [facility].  CSW agreed to complete applications for both and submit for processing. Patient is aware that she will be receiving a follow-up call from [facility] within the next 10 business days for them to perform a phone assessment. Last, patient indicated that she really needs to see a dentist to have a tooth extracted.  CSW agreed to mail patient a list of Medicaid approved providers in [place].  Patient has agreed to choose a provider from the list and schedule her own appointment.  Patient reported that she is ready to take charge of her health and follow-up with all her medical providers.  CSW agreed to follow-up with patient in two weeks to check the status of her applications. [facility] CM Care Plan Problem One     Most Recent Value  Care Plan Problem One  Transportation issues.  Role Documenting the Problem One  Clinical Social Worker  Care Plan for Problem One  Active  [facility] Long Term Goal   Patient will have transportation services in place through [employer], within the next 45 days.  [facility] Long Term Goal Start Date  07/31/17  Interventions for Problem One Long Term Goal  CSW will assist patient with completion of a SC[employer] application and submit to [employer] for processing.    [facility] CM Care Plan Problem Two     Most Recent Value  Care Plan Problem Two  Lack of food resources.  Role Documenting the Problem Two  Clinical Social Worker  Care Plan for Problem Two  Active  [facility] CM Short Term Goal #1   Patient will utilize [facility] [place]  and [employer] to obtain food, within the next three weeks.  [facility] CM Short Term Goal #1 Start Date  07/31/17  Interventions for Short Term Goal #2   CSW will mail patient a copy of [employer] [place] [employer] [place].    [facility] CM Care Plan Problem Three     Most Recent Value  Care Plan Problem Three  Lack of caregiver services in the home.  Role Documenting the Problem Three  Clinical Social Worker  Care Plan for Problem Three  Active  [facility] CM Short Term Goal #1   Patient's eligibility will be determined for in-home care services, within the next three weeks.  [facility] CM Short Term Goal #1 Start Date  08/01/17  Interventions for Short Term Goal #1  CSW will assist patient with completion of CAPS and PCS applications and submit for processing.     Nat Christen, BSW, MSW, LCSW  Licensed Education officer, environmental Health System  Mailing [place] [address] [place], [place] [address] Physical Address-[address], [place], [place] [address] Toll Free Main # [phone] Fax # [phone] Cell # [phone]  Office # [phone] [email or web address]

## 2017-08-01 NOTE — Patient Outreach (Signed)
Request received from Joanna Saporito, LCSW to mail patient personal care resources.  Information mailed today. 

## 2017-08-07 ENCOUNTER — Other Ambulatory Visit: Payer: Self-pay | Admitting: Internal Medicine

## 2017-08-07 ENCOUNTER — Other Ambulatory Visit: Payer: Self-pay | Admitting: *Deleted

## 2017-08-07 DIAGNOSIS — I519 Heart disease, unspecified: Secondary | ICD-10-CM

## 2017-08-07 DIAGNOSIS — I5189 Other ill-defined heart diseases: Secondary | ICD-10-CM

## 2017-08-07 DIAGNOSIS — I10 Essential (primary) hypertension: Secondary | ICD-10-CM

## 2017-08-07 NOTE — Patient Outreach (Signed)
[facility]) Care Management  08/07/2017  Janice Morrow May 10, 1946 [id number]   CSW made an attempt to try and contact patient today to follow-up regarding social work services and resources, as well as to ensure that patient received the packet of resources mailed to her home by CSW; however, patient was unavailable at the time of CSW's call.  A HIPAA compliant message was left for patient on voicemail and CSW is currently awaiting a return call.  CSW will make a second outreach attempt within the next week, if CSW does not receive a return call from patient in the meantime. Nat Christen, BSW, MSW, LCSW  Licensed Education officer, environmental Health System  Mailing [place] [address], [place], [place] [address] Physical Address-[address], [place], [place] [address] Toll Free Main # [phone] Fax # [phone] Cell # [phone]  Office # [phone] [email or web address]

## 2017-08-09 ENCOUNTER — Encounter: Payer: Self-pay | Admitting: Internal Medicine

## 2017-08-12 ENCOUNTER — Other Ambulatory Visit: Payer: Self-pay | Admitting: *Deleted

## 2017-08-12 NOTE — Patient Outreach (Signed)
[facility]) Care Management  08/12/2017  CLATIE KESSEN 02-Nov-1945 [id number]   CSW made a second attempt to try and contact patient today to perform phone assessment, as well as assess and assist with social needs and services, without success.  A HIPAA compliant message was left for patient on voicemail.  CSW is currently awaiting a return call. CSW will make a third and final outreach attempt within the next week, if CSW does not receive a return call from patient in the meantime. Nat Christen, BSW, MSW, LCSW  Licensed Education officer, environmental Health System  Mailing [place] [address], [place], [place] [address] Physical Address[address], [place], [place] [address] Toll Free Main # [phone] Fax # [phone] Cell # [phone]  Office # [phone] [email or web address]

## 2017-08-13 DIAGNOSIS — G894 Chronic pain syndrome: Secondary | ICD-10-CM | POA: Diagnosis not present

## 2017-08-13 DIAGNOSIS — M47817 Spondylosis without myelopathy or radiculopathy, lumbosacral region: Secondary | ICD-10-CM | POA: Diagnosis not present

## 2017-08-13 DIAGNOSIS — M25569 Pain in unspecified knee: Secondary | ICD-10-CM | POA: Diagnosis not present

## 2017-08-13 DIAGNOSIS — M5137 Other intervertebral disc degeneration, lumbosacral region: Secondary | ICD-10-CM | POA: Diagnosis not present

## 2017-08-14 ENCOUNTER — Ambulatory Visit: Payer: Self-pay | Admitting: *Deleted

## 2017-08-22 ENCOUNTER — Encounter: Payer: Self-pay | Admitting: *Deleted

## 2017-08-22 ENCOUNTER — Other Ambulatory Visit: Payer: Self-pay | Admitting: *Deleted

## 2017-08-22 NOTE — Patient Outreach (Signed)
[facility]) Care Management  08/22/2017  MELLA INCLAN 1946-08-13 [id number]   CSW made a third and final attempt to try and contact patient today to perform phone assessment, as well as assess and assist with social work needs and services, without success.  A HIPAA compliant message was left for patient on voicemail.  CSW  continues to await a return call.  CSW will mail an outreach letter to patient's home, encouraging patient to contact CSW at their earliest convenience, if patient is interested in receiving social work services through [place] with [facility].  If CSW does not receive a return call from patient within the next 10 business days, CSW will proceed with case closure.  Required number of phone attempts will have been made and outreach letter mailed.   Nat Christen, BSW, MSW, LCSW  Licensed Education officer, environmental Health System  Mailing [place] [address], [place], [place] [address] Physical Address-[address], [place], [place] [address] Toll Free Main # [phone] Fax # [phone] Cell # [phone]  Office # [phone] [email or web address]

## 2017-09-05 ENCOUNTER — Encounter: Payer: Self-pay | Admitting: *Deleted

## 2017-09-05 ENCOUNTER — Other Ambulatory Visit: Payer: Self-pay | Admitting: *Deleted

## 2017-09-05 NOTE — Patient Outreach (Signed)
[facility]) Care Management  09/05/2017  Janice Morrow 1946/06/28 [id number]   CSW will perform a case closure on patient, due to inability to maintain phone contact, despite required number of phone attempts made and outreach letter mailed to patient's home, allowing 10 business days for a response.  CSW will fax an update to patient's Primary Care Physician, Dr. [name] to ensure that they are aware of CSW's involvement with patient's plan of care.  CSW will submit a case closure request to Alycia Rossetti, Care Management Assistant with [place] [facility], in the form of an In [employer].   Nat Christen, BSW, MSW, LCSW  Licensed Education officer, environmental Health System  Mailing [place] [address] [place], [place] [address] Physical Address-[address], [place], [place] [address] Toll Free Main # [phone] Fax # [phone] Cell # [phone]  Office # [phone] [email or web address]

## 2017-09-05 NOTE — Patient Outreach (Signed)
[facility]) Care Management  09/05/2017  Janice Morrow 06-01-1946 [id number]   CSW will perform a case closure on patient, due to inability to maintain phone contact, despite required number of phone attempts made and outreach letter mailed to patient's home, allowing 10 business days for a response.  CSW will fax an update to patient's Primary Care Physician, Dr. [name] to ensure that they are aware of CSW's involvement with patient's plan of care.  CSW will submit a case closure request to Alycia Rossetti, Care Management Assistant with [place] [facility], in the form of an In [employer].   Nat Christen, BSW, MSW, LCSW  Licensed Education officer, environmental Health System  Mailing [place] [address], [place], [place] [address] Physical Address-[address], [place],  [address] Toll Free Main # [phone] Fax # [phone] Cell # [phone]  Office # [phone] [email or web address]

## 2017-09-10 DIAGNOSIS — M5137 Other intervertebral disc degeneration, lumbosacral region: Secondary | ICD-10-CM | POA: Diagnosis not present

## 2017-09-10 DIAGNOSIS — M47817 Spondylosis without myelopathy or radiculopathy, lumbosacral region: Secondary | ICD-10-CM | POA: Diagnosis not present

## 2017-09-10 DIAGNOSIS — Z79891 Long term (current) use of opiate analgesic: Secondary | ICD-10-CM | POA: Diagnosis not present

## 2017-09-10 DIAGNOSIS — G894 Chronic pain syndrome: Secondary | ICD-10-CM | POA: Diagnosis not present

## 2017-09-10 DIAGNOSIS — Z79899 Other long term (current) drug therapy: Secondary | ICD-10-CM | POA: Diagnosis not present

## 2017-09-20 DIAGNOSIS — N200 Calculus of kidney: Secondary | ICD-10-CM | POA: Diagnosis not present

## 2017-09-30 ENCOUNTER — Inpatient Hospital Stay (HOSPITAL_COMMUNITY)
Admission: EM | Admit: 2017-09-30 | Discharge: 2017-10-03 | DRG: 286 | Disposition: A | Discharge status: Home / Self Care | Payer: Medicare Other | Attending: Cardiovascular Disease | Admitting: Cardiovascular Disease

## 2017-09-30 ENCOUNTER — Encounter (HOSPITAL_COMMUNITY): Payer: Self-pay | Admitting: Emergency Medicine

## 2017-09-30 ENCOUNTER — Emergency Department (HOSPITAL_COMMUNITY): Payer: Medicare Other

## 2017-09-30 DIAGNOSIS — R000 Tachycardia, unspecified: Secondary | ICD-10-CM | POA: Diagnosis not present

## 2017-09-30 DIAGNOSIS — G43909 Migraine, unspecified, not intractable, without status migrainosus: Secondary | ICD-10-CM | POA: Diagnosis present

## 2017-09-30 DIAGNOSIS — Z6841 Body Mass Index (BMI) 40.0 and over, adult: Secondary | ICD-10-CM | POA: Diagnosis not present

## 2017-09-30 DIAGNOSIS — Z7901 Long term (current) use of anticoagulants: Secondary | ICD-10-CM

## 2017-09-30 DIAGNOSIS — M25562 Pain in left knee: Secondary | ICD-10-CM | POA: Diagnosis not present

## 2017-09-30 DIAGNOSIS — M170 Bilateral primary osteoarthritis of knee: Secondary | ICD-10-CM | POA: Diagnosis present

## 2017-09-30 DIAGNOSIS — Z5309 Procedure and treatment not carried out because of other contraindication: Secondary | ICD-10-CM | POA: Diagnosis not present

## 2017-09-30 DIAGNOSIS — I130 Hypertensive heart and chronic kidney disease with heart failure and stage 1 through stage 4 chronic kidney disease, or unspecified chronic kidney disease: Secondary | ICD-10-CM | POA: Diagnosis not present

## 2017-09-30 DIAGNOSIS — J449 Chronic obstructive pulmonary disease, unspecified: Secondary | ICD-10-CM | POA: Diagnosis present

## 2017-09-30 DIAGNOSIS — G4733 Obstructive sleep apnea (adult) (pediatric): Secondary | ICD-10-CM | POA: Diagnosis present

## 2017-09-30 DIAGNOSIS — M546 Pain in thoracic spine: Secondary | ICD-10-CM | POA: Diagnosis present

## 2017-09-30 DIAGNOSIS — I214 Non-ST elevation (NSTEMI) myocardial infarction: Secondary | ICD-10-CM

## 2017-09-30 DIAGNOSIS — R062 Wheezing: Secondary | ICD-10-CM | POA: Diagnosis not present

## 2017-09-30 DIAGNOSIS — Z886 Allergy status to analgesic agent status: Secondary | ICD-10-CM

## 2017-09-30 DIAGNOSIS — Z86711 Personal history of pulmonary embolism: Secondary | ICD-10-CM

## 2017-09-30 DIAGNOSIS — I739 Peripheral vascular disease, unspecified: Secondary | ICD-10-CM | POA: Diagnosis present

## 2017-09-30 DIAGNOSIS — R0603 Acute respiratory distress: Secondary | ICD-10-CM | POA: Diagnosis not present

## 2017-09-30 DIAGNOSIS — G8929 Other chronic pain: Secondary | ICD-10-CM | POA: Diagnosis present

## 2017-09-30 DIAGNOSIS — I5043 Acute on chronic combined systolic (congestive) and diastolic (congestive) heart failure: Secondary | ICD-10-CM | POA: Diagnosis present

## 2017-09-30 DIAGNOSIS — Z9981 Dependence on supplemental oxygen: Secondary | ICD-10-CM

## 2017-09-30 DIAGNOSIS — M25461 Effusion, right knee: Secondary | ICD-10-CM | POA: Diagnosis present

## 2017-09-30 DIAGNOSIS — M542 Cervicalgia: Secondary | ICD-10-CM | POA: Diagnosis present

## 2017-09-30 DIAGNOSIS — Z79899 Other long term (current) drug therapy: Secondary | ICD-10-CM

## 2017-09-30 DIAGNOSIS — Z9989 Dependence on other enabling machines and devices: Secondary | ICD-10-CM

## 2017-09-30 DIAGNOSIS — Z87891 Personal history of nicotine dependence: Secondary | ICD-10-CM

## 2017-09-30 DIAGNOSIS — N183 Chronic kidney disease, stage 3 (moderate): Secondary | ICD-10-CM | POA: Diagnosis present

## 2017-09-30 DIAGNOSIS — R079 Chest pain, unspecified: Secondary | ICD-10-CM | POA: Diagnosis not present

## 2017-09-30 DIAGNOSIS — G500 Trigeminal neuralgia: Secondary | ICD-10-CM | POA: Diagnosis present

## 2017-09-30 DIAGNOSIS — J9621 Acute and chronic respiratory failure with hypoxia: Secondary | ICD-10-CM | POA: Diagnosis present

## 2017-09-30 DIAGNOSIS — Z888 Allergy status to other drugs, medicaments and biological substances status: Secondary | ICD-10-CM

## 2017-09-30 DIAGNOSIS — M79604 Pain in right leg: Secondary | ICD-10-CM | POA: Diagnosis not present

## 2017-09-30 DIAGNOSIS — M25561 Pain in right knee: Secondary | ICD-10-CM | POA: Diagnosis not present

## 2017-09-30 LAB — I-STAT CHEM 8, ED
BUN: 39 mg/dL — ABNORMAL HIGH (ref 6–20)
CALCIUM ION: 1.19 mmol/L (ref 1.15–1.40)
CREATININE: 1.2 mg/dL — AB (ref 0.44–1.00)
Chloride: 102 mmol/L (ref 101–111)
Glucose, Bld: 129 mg/dL — ABNORMAL HIGH (ref 65–99)
HEMATOCRIT: 48 % — AB (ref 36.0–46.0)
HEMOGLOBIN: 16.3 g/dL — AB (ref 12.0–15.0)
Potassium: 4.3 mmol/L (ref 3.5–5.1)
SODIUM: 140 mmol/L (ref 135–145)
TCO2: 30 mmol/L (ref 22–32)

## 2017-09-30 LAB — CBG MONITORING, ED: GLUCOSE-CAPILLARY: 114 mg/dL — AB (ref 65–99)

## 2017-09-30 LAB — CBC
HCT: 45.1 % (ref 36.0–46.0)
Hemoglobin: 14.5 g/dL (ref 12.0–15.0)
MCH: 28.8 pg (ref 26.0–34.0)
MCHC: 32.2 g/dL (ref 30.0–36.0)
MCV: 89.5 fL (ref 78.0–100.0)
PLATELETS: 160 10*3/uL (ref 150–400)
RBC: 5.04 MIL/uL (ref 3.87–5.11)
RDW: 15.9 % — ABNORMAL HIGH (ref 11.5–15.5)
WBC: 14.8 10*3/uL — ABNORMAL HIGH (ref 4.0–10.5)

## 2017-09-30 LAB — BASIC METABOLIC PANEL
Anion gap: 8 (ref 5–15)
BUN: 34 mg/dL — ABNORMAL HIGH (ref 6–20)
CALCIUM: 9.2 mg/dL (ref 8.9–10.3)
CO2: 27 mmol/L (ref 22–32)
CREATININE: 1.23 mg/dL — AB (ref 0.44–1.00)
Chloride: 103 mmol/L (ref 101–111)
GFR calc Af Amer: 51 mL/min — ABNORMAL LOW (ref >60)
GFR, EST NON AFRICAN AMERICAN: 44 mL/min — AB (ref >60)
Glucose, Bld: 129 mg/dL — ABNORMAL HIGH (ref 65–99)
Potassium: 4.3 mmol/L (ref 3.5–5.1)
Sodium: 138 mmol/L (ref 135–145)

## 2017-09-30 LAB — I-STAT TROPONIN, ED: TROPONIN I, POC: 1.51 ng/mL — AB (ref 0.00–0.08)

## 2017-09-30 MED ORDER — ASPIRIN 81 MG PO CHEW
324.0000 mg | CHEWABLE_TABLET | Freq: Once | ORAL | Status: AC
  Administered 2017-09-30: 324 mg via ORAL
  Filled 2017-09-30 (×2): qty 4

## 2017-09-30 MED ORDER — IPRATROPIUM-ALBUTEROL 0.5-2.5 (3) MG/3ML IN SOLN
3.0000 mL | Freq: Once | RESPIRATORY_TRACT | Status: AC
  Administered 2017-09-30: 3 mL via RESPIRATORY_TRACT
  Filled 2017-09-30: qty 3

## 2017-09-30 NOTE — ED Triage Notes (Addendum)
Pt reports R knee pain for the past week that radiates down her leg. No acute injury. Pain worse with movement. Pt ambulatory with cane. SPO2 86% on RA. Pt also reports hx of COPD, but denies any new SOB. Pt then adds that she has had CP since this am along with weakness and dizziness.

## 2017-09-30 NOTE — ED Notes (Signed)
2 failed attempts to collect labs 

## 2017-09-30 NOTE — Progress Notes (Signed)
ANTICOAGULATION CONSULT NOTE - Initial Consult  Pharmacy Consult for IV heparin (on PTA eliquis) Indication: chest pain/ACS  Allergies  Allergen Reactions  . Baclofen Other (See Comments)    Causes seizures  . Naproxen     Palpitations     Patient Measurements:   wt=158 kg Heparin Dosing Weight: 87 kg  Vital Signs: Temp: 99.2 F (37.3 C) (02/04 1646) Temp Source: Oral (02/04 1646) BP: 126/93 (02/04 2159) Pulse Rate: 97 (02/04 2159)  Labs: Recent Labs    09/30/17 2253 09/30/17 2312  HGB 14.5 16.3*  HCT 45.1 48.0*  PLT 160  --   CREATININE 1.23* 1.20*    CrCl cannot be calculated (Unknown ideal weight.).   Medical History: Past Medical History:  Diagnosis Date  . Acute diastolic CHF (congestive heart failure) ([place]) 08/24/2015  . Cervicalgia   . Chronic back pain   . COPD (chronic obstructive pulmonary disease) ([place])   . Essential hypertension 08/24/2015  . GERD (gastroesophageal reflux disease)   . Headache(784.0)   . Heart murmur   . History of kidney stones   . Hyperlipidemia   . Migraine without aura, with intractable migraine, so stated, without mention of status migrainosus   . OSA on CPAP    patient denies sleep apnea uses CPAP to get oxygent o brain  . Osteoarthritis   . Pulmonary embolism ([place])   . PVD (peripheral vascular disease) ([place])   . PVD (peripheral vascular disease) ([place])   . Seizures ([place]) 07/2014, 10/2014   due to baclofen  . Shortness of breath dyspnea   . Trigeminal neuralgia     Medications:  Scheduled:  . aspirin  324 mg Oral Once   Infusions:    Assessment: 89 yoF c/o SOB and CP. IV heparin for ACS. On PTA eliquis for hx of PE.  LD 2/3 unsure of time. Pt taking 10 mg once daily vs prescribed 5 mg bid. May need to use aPtt to follow d/t recent eliquis use.  2/4 Baseline labs: HL=1.76, aptt= <20, Plts=160, H/H WNL  Goal of Therapy:  Heparin level 0.3-0.7 units/ml Monitor platelets by anticoagulation protocol: Yes   Aptt=66-102   Plan:  Baseline aptt, PT/INR and HL STAT Heparin 4000 unit bolus IV x1 Start heparin drip at 1100 units/hr Daily  CBC/HL/aptt Will use aPtt to follow d/t recent eliquis use Check 1st aptt in 6 hours  Dorrene German 09/30/2017,11:46 PM

## 2017-10-01 ENCOUNTER — Encounter (HOSPITAL_COMMUNITY): Payer: Self-pay | Admitting: Cardiology

## 2017-10-01 ENCOUNTER — Encounter (HOSPITAL_COMMUNITY)
Admission: EM | Disposition: A | Discharge status: Home / Self Care | Payer: Self-pay | Source: Home / Self Care | Attending: Cardiovascular Disease

## 2017-10-01 DIAGNOSIS — I5043 Acute on chronic combined systolic (congestive) and diastolic (congestive) heart failure: Secondary | ICD-10-CM | POA: Diagnosis not present

## 2017-10-01 DIAGNOSIS — I739 Peripheral vascular disease, unspecified: Secondary | ICD-10-CM | POA: Diagnosis present

## 2017-10-01 DIAGNOSIS — Z9981 Dependence on supplemental oxygen: Secondary | ICD-10-CM | POA: Diagnosis not present

## 2017-10-01 DIAGNOSIS — M542 Cervicalgia: Secondary | ICD-10-CM | POA: Diagnosis present

## 2017-10-01 DIAGNOSIS — I214 Non-ST elevation (NSTEMI) myocardial infarction: Secondary | ICD-10-CM | POA: Diagnosis not present

## 2017-10-01 DIAGNOSIS — M25461 Effusion, right knee: Secondary | ICD-10-CM | POA: Diagnosis present

## 2017-10-01 DIAGNOSIS — Z86711 Personal history of pulmonary embolism: Secondary | ICD-10-CM | POA: Diagnosis not present

## 2017-10-01 DIAGNOSIS — M546 Pain in thoracic spine: Secondary | ICD-10-CM | POA: Diagnosis present

## 2017-10-01 DIAGNOSIS — R05 Cough: Secondary | ICD-10-CM | POA: Diagnosis not present

## 2017-10-01 DIAGNOSIS — Z9989 Dependence on other enabling machines and devices: Secondary | ICD-10-CM | POA: Diagnosis not present

## 2017-10-01 DIAGNOSIS — Z6841 Body Mass Index (BMI) 40.0 and over, adult: Secondary | ICD-10-CM | POA: Diagnosis not present

## 2017-10-01 DIAGNOSIS — M170 Bilateral primary osteoarthritis of knee: Secondary | ICD-10-CM | POA: Diagnosis present

## 2017-10-01 DIAGNOSIS — J9621 Acute and chronic respiratory failure with hypoxia: Secondary | ICD-10-CM | POA: Diagnosis present

## 2017-10-01 DIAGNOSIS — Z87891 Personal history of nicotine dependence: Secondary | ICD-10-CM | POA: Diagnosis not present

## 2017-10-01 DIAGNOSIS — J9610 Chronic respiratory failure, unspecified whether with hypoxia or hypercapnia: Secondary | ICD-10-CM | POA: Diagnosis not present

## 2017-10-01 DIAGNOSIS — G8929 Other chronic pain: Secondary | ICD-10-CM | POA: Diagnosis present

## 2017-10-01 DIAGNOSIS — J449 Chronic obstructive pulmonary disease, unspecified: Secondary | ICD-10-CM | POA: Diagnosis present

## 2017-10-01 DIAGNOSIS — I130 Hypertensive heart and chronic kidney disease with heart failure and stage 1 through stage 4 chronic kidney disease, or unspecified chronic kidney disease: Secondary | ICD-10-CM | POA: Diagnosis present

## 2017-10-01 DIAGNOSIS — Z7901 Long term (current) use of anticoagulants: Secondary | ICD-10-CM | POA: Diagnosis not present

## 2017-10-01 DIAGNOSIS — R0603 Acute respiratory distress: Secondary | ICD-10-CM | POA: Diagnosis not present

## 2017-10-01 DIAGNOSIS — Z888 Allergy status to other drugs, medicaments and biological substances status: Secondary | ICD-10-CM | POA: Diagnosis not present

## 2017-10-01 DIAGNOSIS — R0600 Dyspnea, unspecified: Secondary | ICD-10-CM | POA: Diagnosis not present

## 2017-10-01 DIAGNOSIS — Z886 Allergy status to analgesic agent status: Secondary | ICD-10-CM | POA: Diagnosis not present

## 2017-10-01 DIAGNOSIS — G43909 Migraine, unspecified, not intractable, without status migrainosus: Secondary | ICD-10-CM | POA: Diagnosis present

## 2017-10-01 DIAGNOSIS — G500 Trigeminal neuralgia: Secondary | ICD-10-CM | POA: Diagnosis present

## 2017-10-01 DIAGNOSIS — G4733 Obstructive sleep apnea (adult) (pediatric): Secondary | ICD-10-CM | POA: Diagnosis present

## 2017-10-01 DIAGNOSIS — N183 Chronic kidney disease, stage 3 (moderate): Secondary | ICD-10-CM | POA: Diagnosis present

## 2017-10-01 HISTORY — PX: LEFT HEART CATH AND CORONARY ANGIOGRAPHY: CATH118249

## 2017-10-01 LAB — URINALYSIS, ROUTINE W REFLEX MICROSCOPIC
BILIRUBIN URINE: NEGATIVE
Glucose, UA: NEGATIVE mg/dL
KETONES UR: NEGATIVE mg/dL
Nitrite: NEGATIVE
PH: 5 (ref 5.0–8.0)
Protein, ur: 30 mg/dL — AB
Specific Gravity, Urine: 1.024 (ref 1.005–1.030)

## 2017-10-01 LAB — APTT: aPTT: <20 seconds — ABNORMAL LOW (ref 24–36)

## 2017-10-01 LAB — PROTIME-INR
INR: 1.18
Prothrombin Time: 14.9 seconds (ref 11.4–15.2)

## 2017-10-01 LAB — TROPONIN I: Troponin I: 1.53 ng/mL (ref <0.03)

## 2017-10-01 LAB — HEPARIN LEVEL (UNFRACTIONATED): Heparin Unfractionated: 1.76 IU/mL — ABNORMAL HIGH (ref 0.30–0.70)

## 2017-10-01 SURGERY — LEFT HEART CATH AND CORONARY ANGIOGRAPHY
Anesthesia: LOCAL

## 2017-10-01 MED ORDER — HEPARIN (PORCINE) IN NACL 100-0.45 UNIT/ML-% IJ SOLN
1100.0000 [IU]/h | INTRAMUSCULAR | Status: DC
  Administered 2017-10-01: 1100 [IU]/h via INTRAVENOUS
  Filled 2017-10-01: qty 250

## 2017-10-01 MED ORDER — ONDANSETRON HCL 4 MG/2ML IJ SOLN: 4.0000 mg | Freq: Four times a day (QID) | INTRAMUSCULAR | Status: DC | PRN

## 2017-10-01 MED ORDER — ASPIRIN 81 MG PO CHEW: 81.0000 mg | CHEWABLE_TABLET | ORAL | Status: DC

## 2017-10-01 MED ORDER — HEPARIN (PORCINE) IN NACL 2-0.9 UNIT/ML-% IJ SOLN
INTRAMUSCULAR | Status: AC
  Filled 2017-10-01: qty 1500

## 2017-10-01 MED ORDER — IOHEXOL 350 MG/ML SOLN
INTRAVENOUS | Status: DC | PRN
  Administered 2017-10-01: 100 mL via INTRA_ARTERIAL

## 2017-10-01 MED ORDER — SODIUM CHLORIDE 0.9 % WEIGHT BASED INFUSION: 1.0000 mL/kg/h | INTRAVENOUS | Status: DC

## 2017-10-01 MED ORDER — ASPIRIN EC 81 MG PO TBEC
81.0000 mg | DELAYED_RELEASE_TABLET | Freq: Every day | ORAL | Status: DC
  Administered 2017-10-02 – 2017-10-03 (×2): 81 mg via ORAL
  Filled 2017-10-01 (×3): qty 1

## 2017-10-01 MED ORDER — IOPAMIDOL (ISOVUE-370) INJECTION 76%
INTRAVENOUS | Status: DC | PRN
  Administered 2017-10-01: 10 mL via INTRA_ARTERIAL

## 2017-10-01 MED ORDER — SODIUM CHLORIDE 0.9 % IV SOLN
INTRAVENOUS | Status: AC | PRN
  Administered 2017-10-01: 20 mL via INTRAVENOUS

## 2017-10-01 MED ORDER — ONDANSETRON HCL 4 MG/2ML IJ SOLN
4.0000 mg | Freq: Four times a day (QID) | INTRAMUSCULAR | Status: DC | PRN
Start: 2017-10-01 — End: 2017-10-01

## 2017-10-01 MED ORDER — SODIUM CHLORIDE 0.9 % WEIGHT BASED INFUSION: 3.0000 mL/kg/h | INTRAVENOUS | Status: DC

## 2017-10-01 MED ORDER — HEPARIN BOLUS VIA INFUSION
4000.0000 [IU] | Freq: Once | INTRAVENOUS | Status: AC
  Administered 2017-10-01: 4000 [IU] via INTRAVENOUS
  Filled 2017-10-01: qty 4000

## 2017-10-01 MED ORDER — SODIUM CHLORIDE 0.9% FLUSH: 3.0000 mL | INTRAVENOUS | Status: DC | PRN

## 2017-10-01 MED ORDER — HEPARIN SODIUM (PORCINE) 1000 UNIT/ML IJ SOLN
INTRAMUSCULAR | Status: DC | PRN
  Administered 2017-10-01: 6000 [IU] via INTRAVENOUS

## 2017-10-01 MED ORDER — ACETAMINOPHEN 325 MG PO TABS
650.0000 mg | ORAL_TABLET | ORAL | Status: DC | PRN
  Administered 2017-10-01 – 2017-10-02 (×3): 650 mg via ORAL
  Filled 2017-10-01 (×3): qty 2

## 2017-10-01 MED ORDER — SODIUM CHLORIDE 0.9 % IV SOLN
INTRAVENOUS | Status: DC
  Administered 2017-10-01: 04:00:00 via INTRAVENOUS

## 2017-10-01 MED ORDER — ACETAMINOPHEN 325 MG PO TABS
650.0000 mg | ORAL_TABLET | ORAL | Status: DC | PRN
  Administered 2017-10-01: 650 mg via ORAL
  Filled 2017-10-01: qty 2

## 2017-10-01 MED ORDER — ACETAMINOPHEN 325 MG PO TABS: 650.0000 mg | ORAL_TABLET | ORAL | Status: DC | PRN

## 2017-10-01 MED ORDER — SODIUM CHLORIDE 0.9 % IV SOLN: 250.0000 mL | INTRAVENOUS | Status: DC | PRN

## 2017-10-01 MED ORDER — SODIUM CHLORIDE 0.9% FLUSH: 3.0000 mL | Freq: Two times a day (BID) | INTRAVENOUS | Status: DC

## 2017-10-01 MED ORDER — UMECLIDINIUM-VILANTEROL 62.5-25 MCG/INH IN AEPB
1.0000 | INHALATION_SPRAY | Freq: Every day | RESPIRATORY_TRACT | Status: DC
  Administered 2017-10-02 – 2017-10-03 (×2): 1 via RESPIRATORY_TRACT
  Filled 2017-10-01: qty 14

## 2017-10-01 MED ORDER — CARVEDILOL 3.125 MG PO TABS
3.1250 mg | ORAL_TABLET | Freq: Two times a day (BID) | ORAL | Status: DC
  Administered 2017-10-01 – 2017-10-03 (×4): 3.125 mg via ORAL
  Filled 2017-10-01 (×6): qty 1

## 2017-10-01 MED ORDER — ASPIRIN 300 MG RE SUPP: 300.0000 mg | RECTAL | Status: DC

## 2017-10-01 MED ORDER — HEPARIN (PORCINE) IN NACL 2-0.9 UNIT/ML-% IJ SOLN
INTRAMUSCULAR | Status: AC | PRN
  Administered 2017-10-01: 1000 mL

## 2017-10-01 MED ORDER — ASPIRIN 81 MG PO CHEW: 324.0000 mg | CHEWABLE_TABLET | Freq: Once | ORAL | Status: DC

## 2017-10-01 MED ORDER — ASPIRIN 81 MG PO CHEW: 324.0000 mg | CHEWABLE_TABLET | ORAL | Status: DC

## 2017-10-01 MED ORDER — LIDOCAINE HCL (PF) 1 % IJ SOLN
INTRAMUSCULAR | Status: AC
  Filled 2017-10-01: qty 30

## 2017-10-01 MED ORDER — GUAIFENESIN-DM 100-10 MG/5ML PO SYRP
5.0000 mL | ORAL_SOLUTION | ORAL | Status: DC | PRN
  Administered 2017-10-02: 5 mL via ORAL
  Filled 2017-10-01: qty 5

## 2017-10-01 MED ORDER — HEPARIN (PORCINE) IN NACL 100-0.45 UNIT/ML-% IJ SOLN
1550.0000 [IU]/h | INTRAMUSCULAR | Status: DC
  Administered 2017-10-02: 1100 [IU]/h via INTRAVENOUS
  Administered 2017-10-03: 1550 [IU]/h via INTRAVENOUS
  Filled 2017-10-01 (×2): qty 250

## 2017-10-01 MED ORDER — FUROSEMIDE 40 MG PO TABS: 40.0000 mg | ORAL_TABLET | Freq: Every day | ORAL | Status: DC

## 2017-10-01 MED ORDER — VERAPAMIL HCL 2.5 MG/ML IV SOLN
INTRAVENOUS | Status: AC
  Filled 2017-10-01: qty 2

## 2017-10-01 MED ORDER — LIDOCAINE HCL (PF) 1 % IJ SOLN
INTRAMUSCULAR | Status: DC | PRN
  Administered 2017-10-01 (×2): 2 mL

## 2017-10-01 MED ORDER — NITROGLYCERIN 0.4 MG SL SUBL: 0.4000 mg | SUBLINGUAL_TABLET | SUBLINGUAL | Status: DC | PRN

## 2017-10-01 MED ORDER — SODIUM CHLORIDE 0.9 % IV SOLN
INTRAVENOUS | Status: AC
  Administered 2017-10-01: 13:00:00 via INTRAVENOUS

## 2017-10-01 MED ORDER — VERAPAMIL HCL 2.5 MG/ML IV SOLN
INTRAVENOUS | Status: DC | PRN
  Administered 2017-10-01: 10 mL via INTRA_ARTERIAL

## 2017-10-01 MED ORDER — FUROSEMIDE 40 MG PO TABS
40.0000 mg | ORAL_TABLET | Freq: Two times a day (BID) | ORAL | Status: DC
  Administered 2017-10-01: 40 mg via ORAL
  Filled 2017-10-01: qty 1

## 2017-10-01 MED ORDER — SODIUM CHLORIDE 0.9 % IV SOLN: INTRAVENOUS | Status: DC

## 2017-10-01 MED ORDER — NITROGLYCERIN 2 % TD OINT
1.0000 [in_us] | TOPICAL_OINTMENT | Freq: Four times a day (QID) | TRANSDERMAL | Status: DC
  Administered 2017-10-01 – 2017-10-02 (×3): 1 [in_us] via TOPICAL
  Filled 2017-10-01: qty 1
  Filled 2017-10-01: qty 30

## 2017-10-01 SURGICAL SUPPLY — 14 items
CATH INFINITI 5 FR JL3.5 (CATHETERS) ×2 IMPLANT
CATH INFINITI 5FR ANG PIGTAIL (CATHETERS) ×2 IMPLANT
CATH OPTITORQUE TIG 4.0 5F (CATHETERS) ×2 IMPLANT
COVER PRB 48X5XTLSCP FOLD TPE (BAG) ×1 IMPLANT
COVER PROBE 5X48 (BAG) ×1
DEVICE RAD COMP TR BAND LRG (VASCULAR PRODUCTS) ×2 IMPLANT
GLIDESHEATH SLEND A-KIT 6F 22G (SHEATH) ×2 IMPLANT
GUIDEWIRE INQWIRE 1.5J.035X260 (WIRE) ×1 IMPLANT
INQWIRE 1.5J .035X260CM (WIRE) ×2
KIT HEART LEFT (KITS) ×2 IMPLANT
PACK CARDIAC CATHETERIZATION (CUSTOM PROCEDURE TRAY) ×2 IMPLANT
SHEATH GLIDE SLENDER 4/5FR (SHEATH) ×2 IMPLANT
TRANSDUCER W/STOPCOCK (MISCELLANEOUS) ×2 IMPLANT
TUBING CIL FLEX 10 FLL-RA (TUBING) ×2 IMPLANT

## 2017-10-01 NOTE — Consult Note (Signed)
Name: Janice Morrow MRN: [id number] DOB: 1945-10-27    ADMISSION DATE:  09/30/2017 CONSULTATION DATE:  10/01/17  REFERRING MD :  Dr. Johnsie Cancel / Cardiology   CHIEF COMPLAINT:  Dyspnea    HISTORY OF PRESENT ILLNESS:  72 y/o F admitted 2/4 with reports of chest pain, weakness, dizziness, and leg pain.    Reports she quit smoking in 2011, smoked a pack of cigarettes every 3 days since starting in early childhood.  At baseline, wears 3L O2 at home for the last three years due to her COPD.  She walks with a cane and has 4-5 pillow orthopnea and reports shortness of breath since childhood.  On Eliquis since July 2018 for pulmonary embolism.   She initially reported to the ER with complaints of right knee pain that radiated down her leg. She denied known injury. She later stated she had pressure / heaviness in her chest lasting 6-8 minutes at a time.  She also reported shortness of breath.  She is on Eliquis for prior PE. She was found to have elevated troponin (1.53) and admitted for further evaluation.  EKG did not show acute ST changes. The patient was taken to the cath lab on 2/5 for LHC.  During the process of transfer to the cath table, she became dyspneic and required NRB. CXR on admit was negative for acute process.  Due to dyspnea and increased O2 needs, PCCM consulted for evaluation.     PAST MEDICAL HISTORY :   has a past medical history of Acute diastolic CHF (congestive heart failure) ([place]) (08/24/2015), Cervicalgia, Chronic back pain, COPD (chronic obstructive pulmonary disease) ([place]), Essential hypertension (08/24/2015), GERD (gastroesophageal reflux disease), Headache(784.0), Heart murmur, History of kidney stones, Hyperlipidemia, Migraine without aura, with intractable migraine, so stated, without mention of status migrainosus, OSA on CPAP, Osteoarthritis, Pulmonary embolism ([place]), PVD (peripheral vascular disease) ([place]), PVD (peripheral vascular disease) ([place]), Seizures ([place]) (07/2014,  10/2014), Shortness of breath dyspnea, and Trigeminal neuralgia.   has a past surgical history that includes Multiple tooth extractions; No past surgeries; Ureteroscopy with holmium laser lithotripsy (Left, 07/09/2017); Ureteroscopy with holmium laser lithotripsy (Left, 07/23/2017); and LEFT HEART CATH AND CORONARY ANGIOGRAPHY (N/A, 10/01/2017).  Prior to Admission medications   Medication Sig Start Date End Date Taking? Authorizing Provider  apixaban (ELIQUIS) 5 MG TABS tablet Take 1 tablet (5 mg total) by mouth 2 (two) times daily. Patient taking differently: Take 10 mg by mouth daily.  01/30/17  Yes Janith Lima, MD  carvedilol (COREG) 3.125 MG tablet TAKE 1 TABLET BY MOUTH 2 TIMES DAILY WITH A MEAL 08/07/17  Yes Janith Lima, MD  furosemide (LASIX) 40 MG tablet Take 1 tablet (40 mg total) by mouth daily. 07/19/17  Yes Janith Lima, MD  gabapentin (NEURONTIN) 400 MG capsule Take 400-1,200 mg by mouth See admin instructions. TAKE 1 CAPSULE AT LUNCH, TAKE 2 CAPSULES AT SUPPER AND 3 CAPSULES AT BEDTIME    Yes [provider]  HYDROcodone-acetaminophen (NORCO) 10-325 MG tablet Take 1 tablet by mouth every 6 (six) hours as needed. Patient taking differently: Take 1 tablet by mouth every 6 (six) hours as needed for moderate pain.  09/15/15  Yes Janith Lima, MD  umeclidinium-vilanterol (ANORO ELLIPTA) 62.5-25 MCG/INH AEPB Inhale 1 puff into the lungs daily. 05/10/17  Yes Janith Lima, MD  traMADol (ULTRAM) 50 MG tablet Take 1 tablet (50 mg total) by mouth every 6 (six) hours as needed. Patient not taking: Reported on 09/30/2017  07/23/17 07/23/18  Festus Aloe, MD    Allergies  Allergen Reactions  . Baclofen Other (See Comments)    Causes seizures  . Naproxen     Palpitations     FAMILY HISTORY:  family history includes Dementia in her mother; Diabetes in her brother, mother, and unknown relative; Heart attack in her father; Heart disease in her unknown relative.  SOCIAL  HISTORY:  reports that she quit smoking about 7 years ago. Her smoking use included cigarettes. she has never used smokeless tobacco. She reports that she does not drink alcohol or use drugs.  REVIEW OF SYSTEMS:  POSITIVES IN BOLD  Constitutional: Negative for fever, chills, weight loss, malaise/fatigue and diaphoresis.  HENT: Negative for hearing loss, ear pain, nosebleeds, congestion, sore throat, neck pain, tinnitus and ear discharge.   Eyes: Negative for blurred vision, double vision, photophobia, pain, discharge and redness.  Respiratory: Negative for cough, hemoptysis, sputum production, shortness of breath, wheezing and stridor.   Cardiovascular: Negative for chest pain, palpitations, orthopnea, claudication, leg swelling and PND.  Gastrointestinal: Negative for heartburn, nausea, vomiting, abdominal pain, diarrhea, constipation, blood in stool and melena.  Genitourinary: Negative for dysuria, urgency, frequency, hematuria and flank pain.  Musculoskeletal: Negative for myalgias, back pain, joint pain and falls.  Skin: Negative for itching and rash.  Neurological: Negative for dizziness, tingling, tremors, sensory change, speech change, focal weakness, seizures, loss of consciousness, weakness and headaches.  Endo/Heme/Allergies: Negative for environmental allergies and polydipsia. Does not bruise/bleed easily.  SUBJECTIVE:   VITAL SIGNS: Pulse Rate:  [0-295] 163 (02/05 1146) Resp:  [8-111] 21 (02/05 1500) BP: (109-167)/(57-125) 119/89 (02/05 1500) SpO2:  [0 %-100 %] 97 % (02/05 1500) Weight:  [350 lb (158.8 kg)] 350 lb (158.8 kg) (02/05 0000)  PHYSICAL EXAMINATION: General:   HEENT: MM pink/moist PSY: Neuro:  CV: s1s2 rrr, no m/r/g PULM: even/non-labored, lungs bilaterally. GL:OVFI, non-tender, bsx4 active  Extremities: warm/dry, + edema  Skin: no rashes or lesions   Recent Labs  Lab 09/30/17 2253 09/30/17 2312  NA 138 140  K 4.3 4.3  CL 103 102  CO2 27  --   BUN  34* 39*  CREATININE 1.23* 1.20*  GLUCOSE 129* 129*    Recent Labs  Lab 09/30/17 2253 09/30/17 2312  HGB 14.5 16.3*  HCT 45.1 48.0*  WBC 14.8*  --   PLT 160  --     Dg Chest 2 View  Result Date: 09/30/2017 CLINICAL DATA:  Hypoxemia, 86% oxygen saturation on room air, COPD, chest pain, weakness, dizziness EXAM: CHEST  2 VIEW COMPARISON:  02/19/2017 FINDINGS: Normal heart size, mediastinal contours, and pulmonary vascularity. Minimal atelectasis at RIGHT. Lungs emphysematous but otherwise grossly clear. Atherosclerotic calcifications at aortic arch. No pleural effusion or pneumothorax. IMPRESSION: Emphysematous changes with minimal RIGHT basilar atelectasis. Electronically Signed   By: Lavonia Dana M.D.   On: 09/30/2017 18:19   Dg Knee Complete 4 Views Right  Result Date: 09/30/2017 CLINICAL DATA:  Right knee pain EXAM: RIGHT KNEE - COMPLETE 4+ VIEW COMPARISON:  11/16/2016 FINDINGS: No fracture or malalignment. Marked arthritis involving the medial and patellofemoral compartments with narrow and degenerative spurring. Mild degenerative changes of the lateral compartment with bony spurring present. Trace suprapatellar effusion. IMPRESSION: 1. No acute osseous abnormality 2. Tricompartment arthritis most marked involving the medial and patellofemoral compartments. Trace effusion. Electronically Signed   By: Donavan Foil M.D.   On: 09/30/2017 18:15   SIGNIFICANT EVENTS  2/04  Admit with chest pain  2/05  LHC >>  STUDIES:  2/05  LHC >>   There is mild to moderate left ventricular systolic dysfunction.  LV end diastolic pressure is severely elevated. EDP 25-30 mmHg  The left ventricular ejection fraction is 40-45% by visual estimate. Global hypokinesis  There is trace-mild (2+) mitral regurgitation.  Ost Cx to Prox Cx lesion is 30% stenosed. Otherwise minimal CAD with a large wraparound LAD.   ASSESSMENT / PLAN:  Discussion:  72 y/o F admitted on 2/4 with chest pain.  During [place] on  2/5, she became dyspneic while transferring to the cath lab table.  Plan to be medically managed by Cardiology with diuresis    Dyspnea - acute on chronic hypoxic respiratory failure secondary to acute on chronic heart failure COPD without acute exacerbation Plan: Diurese per Cards Continue supplemental O2, baseline home O2 3L   Continue home Anoro Ellipta Will need outpatient evaluation for COPD and possible pulmonary hypertension as causes of dyspnea.   Hx PE - on home Eliquis   Plan: Heparin per Cards   OSA - on CPAP  Plan: Continue CPAP q HS and when napping  Attending:  I have seen and examined the patient with nurse practitioner/resident and agree with the note above.  We formulated the plan together and I elicited the following history.    Subjective: Chronic exertional dyspnea.  Objective: Vitals:   10/01/17 1142 10/01/17 1146 10/01/17 1157 10/01/17 1500  BP: (!) 167/91 (!) 145/95  119/89  Pulse: 71 (!) 163    Resp: (!) 111 18  (!) 21  Temp:      TempSrc:      SpO2: 100% 99% 100% 97%  Weight:      Height:          Intake/Output Summary (Last 24 hours) at 10/01/2017 1756 Last data filed at 10/01/2017 1600 Gross per 24 hour  Intake 253.75 ml  Output -  Net 253.75 ml    HEENT: no jvd or icterus Chest: few coarse crackles at the bases. No wheezes or rhonchi. Cor: normal s1s2, regular rhythm. No murmur or gallop. Abd: morbidly obese. No tenderness Ext: morbidly obese. Trace pedal edema. No cyanosis.    CBC    Component Value Date/Time   WBC 14.8 (H) 09/30/2017 2253   RBC 5.04 09/30/2017 2253   HGB 16.3 (H) 09/30/2017 2312   HCT 48.0 (H) 09/30/2017 2312   PLT 160 09/30/2017 2253   MCV 89.5 09/30/2017 2253   MCH 28.8 09/30/2017 2253   MCHC 32.2 09/30/2017 2253   RDW 15.9 (H) 09/30/2017 2253   RDW 15.3 05/04/2013 1602   LYMPHSABS 2.3 01/30/2017 1629   MONOABS 0.8 01/30/2017 1629   EOSABS 0.1 01/30/2017 1629   BASOSABS 0.1 01/30/2017 1629     BMET    Component Value Date/Time   NA 140 09/30/2017 2312   NA 139 05/04/2013 1602   K 4.3 09/30/2017 2312   CL 102 09/30/2017 2312   CO2 27 09/30/2017 2253   GLUCOSE 129 (H) 09/30/2017 2312   BUN 39 (H) 09/30/2017 2312   BUN 16 05/04/2013 1602   CREATININE 1.20 (H) 09/30/2017 2312   CALCIUM 9.2 09/30/2017 2253   GFRNONAA 44 (L) 09/30/2017 2253   GFRAA 51 (L) 09/30/2017 2253     Impression/Plan: 1. Dyspnea 2. COPD by history 3. PE 4. OSA   Agree with above plan.    Jesus Genera, MD [place] [facility] Pager: [phone]

## 2017-10-01 NOTE — Interval H&P Note (Signed)
History and Physical Interval Note:  10/01/2017 10:19 AM  Janice Morrow  has presented today for surgery, with the diagnosis of nstemi.  Upon arrival to the Cath Lab, the patient was actively still having chest discomfort and profound dyspnea.  She is at baseline profoundly orthopneic, telling me that she uses roughly 4 or 5 pillows at home chronically since childhood.  When transferring to the Cath Lab table, she was profoundly dyspneic and required nonrebreather and became almost somnolent. Also noted was a question of concern of possible bedbug infestation.  With nonrebreather in place, the patient's breathing was much better with wedge in place.  We will attempt right and left heart catheterization, however her threshold for staging any potential intervention if necessary to allow for diuresis.  Glenetta Hew

## 2017-10-01 NOTE — H&P (Signed)
Physician History and Physical     Patient ID: Janice Morrow MRN: [id number] DOB/AGE: 10/24/1945 72 y.o. Admit date: 09/30/2017  Primary Care Physician: Janith Lima, MD Primary Cardiologist: Stanford Breed   Active Problems:   NSTEMI (non-ST elevated myocardial infarction) [facility])   HPI:  72 y.o. morbidly obese female with chronic respiratory failure wears CPAP day and night. Activity limited By arthritis in knees especially right. Seen in ER for SSCP Pain described as pressure. Not always exertional Has had for months Worse last week. No fall or trauma. Chronic dyspnea In ER primary complaint now is  Right knee pain. Fellow called last night and indicated need for cath and transfer to [facility] as troponin elevated No pain currently Patient indicates seeing heart doctors in [place] about 8 years ago and given SL nitro But no cath. Troponin elevatd 1.53 with no acute ST changes. CRF; HTN History of heart murmur but echo done 12/01/16 with no significant valve disease She has seen Dr Stanford Breed here in [place]. Myovue 05/2015 breast Attenuation.   Review of systems complete and found to be negative unless listed above   Past Medical History:  Diagnosis Date  . Acute diastolic CHF (congestive heart failure) ([place]) 08/24/2015  . Cervicalgia   . Chronic back pain   . COPD (chronic obstructive pulmonary disease) ([place])   . Essential hypertension 08/24/2015  . GERD (gastroesophageal reflux disease)   . Headache(784.0)   . Heart murmur   . History of kidney stones   . Hyperlipidemia   . Migraine without aura, with intractable migraine, so stated, without mention of status migrainosus   . OSA on CPAP    patient denies sleep apnea uses CPAP to get oxygent o brain  . Osteoarthritis   . Pulmonary embolism ([place])   . PVD (peripheral vascular disease) ([place])   . PVD (peripheral vascular disease) ([place])   . Seizures ([place]) 07/2014, 10/2014   due to baclofen  . Shortness of breath dyspnea   .  Trigeminal neuralgia     Family History  Problem Relation Age of Onset  . Heart attack Father   . Diabetes Mother   . Dementia Mother   . Diabetes Brother   . Heart disease Unknown   . Diabetes Unknown     Social History   Socioeconomic History  . Marital status: Single    Spouse name: Not on file  . Number of children: 1  . Years of education: 12th  . Highest education level: Not on file  Social Needs  . Financial resource strain: Not on file  . Food insecurity - worry: Not on file  . Food insecurity - inability: Not on file  . Transportation needs - medical: Not on file  . Transportation needs - non-medical: Not on file  Occupational History  . Occupation: DISABLED    Employer: UNEMPLOYED  Tobacco Use  . Smoking status: Former Smoker    Types: Cigarettes    Last attempt to quit: 08/22/2010    Years since quitting: 7.1  . Smokeless tobacco: Never Used  Substance and Sexual Activity  . Alcohol use: No    Alcohol/week: 0.0 oz  . Drug use: No  . Sexual activity: No  Other Topics Concern  . Not on file  Social History Narrative   Patient lives at home with her brother.    Disabled.   Education high school.   Right handed.   Caffeine Use: 5-6 20oz bottle sodas daily    Past Surgical  History:  Procedure Laterality Date  . MULTIPLE TOOTH EXTRACTIONS     "they took out part of my teeth; I took out the rest when they got loose; was suppose to get dentures; never did"  . NO PAST SURGERIES    . URETEROSCOPY WITH HOLMIUM LASER LITHOTRIPSY Left 07/09/2017   Procedure: URETEROSCOPY WITH HOLMIUM LASER LITHOTRIPSY/ STENT;  Surgeon: Festus Aloe, MD;  Location: [facility];  Service: Urology;  Laterality: Left;  . URETEROSCOPY WITH HOLMIUM LASER LITHOTRIPSY Left 07/23/2017   Procedure: URETEROSCOPY WITH HOLMIUM LASER LITHOTRIPSY /STENT;  Surgeon: Festus Aloe, MD;  Location: [facility];  Service: Urology;  Laterality: Left;  NEEDS 60 MINUTES FOR PROCEDURE      (Not in a  hospital admission)  Physical Exam: Blood pressure 109/72, pulse 90, temperature 99.2 F (37.3 C), temperature source Oral, resp. rate (!) 24, height 5\' 5"  (1.651 m), weight (!) 350 lb (158.8 kg), last menstrual period 08/27/1993, SpO2 95 %. Affect appropriate Morbidly obese black femlae  HEENT: normal Neck supple with no adenopathy JVP normal no bruits no thyromegaly Lungs clear with no wheezing and good diaphragmatic motion Heart:  S1/S2 no murmur, no rub, gallop or click PMI normal Abdomen: benighn, BS positve, no tenderness, no AAA no bruit.  No HSM or HJR Distal pulses intact with no bruits Plus one bilateral edema Neuro non-focal Skin warm and dry Arthritis right knee with small effusion   No current facility-administered medications on file prior to encounter.    Current Outpatient Medications on File Prior to Encounter  Medication Sig Dispense Refill  . apixaban (ELIQUIS) 5 MG TABS tablet Take 1 tablet (5 mg total) by mouth 2 (two) times daily. (Patient taking differently: Take 10 mg by mouth daily. ) 60 tablet 5  . carvedilol (COREG) 3.125 MG tablet TAKE 1 TABLET BY MOUTH 2 TIMES DAILY WITH A MEAL 180 tablet 1  . furosemide (LASIX) 40 MG tablet Take 1 tablet (40 mg total) by mouth daily. 90 tablet 1  . gabapentin (NEURONTIN) 400 MG capsule Take 400-1,200 mg by mouth See admin instructions. TAKE 1 CAPSULE AT LUNCH, TAKE 2 CAPSULES AT SUPPER AND 3 CAPSULES AT BEDTIME     . HYDROcodone-acetaminophen (NORCO) 10-325 MG tablet Take 1 tablet by mouth every 6 (six) hours as needed. (Patient taking differently: Take 1 tablet by mouth every 6 (six) hours as needed for moderate pain. ) 90 tablet 0  . umeclidinium-vilanterol (ANORO ELLIPTA) 62.5-25 MCG/INH AEPB Inhale 1 puff into the lungs daily. 60 each 7  . traMADol (ULTRAM) 50 MG tablet Take 1 tablet (50 mg total) by mouth every 6 (six) hours as needed. (Patient not taking: Reported on 09/30/2017) 20 tablet 0    Labs:   Lab Results    Component Value Date   WBC 14.8 (H) 09/30/2017   HGB 16.3 (H) 09/30/2017   HCT 48.0 (H) 09/30/2017   MCV 89.5 09/30/2017   PLT 160 09/30/2017   Recent Labs  Lab 09/30/17 2253 09/30/17 2312  NA 138 140  K 4.3 4.3  CL 103 102  CO2 27  --   BUN 34* 39*  CREATININE 1.23* 1.20*  CALCIUM 9.2  --   GLUCOSE 129* 129*   Lab Results  Component Value Date   CKTOTAL 164 08/22/2014   CKTOTAL 125 08/22/2010   CKTOTAL 114 08/22/2010   CKMB 2.2 08/22/2010   CKMB 1.7 08/22/2010   CKMB 1.6 08/22/2010   TROPONINI 1.53 (HH) 10/01/2017   TROPONINI <0.03 01/17/2017  TROPONINI <0.03 01/17/2017     Lab Results  Component Value Date   CHOL 179 01/30/2017   CHOL 151 09/15/2015   CHOL 182 10/28/2014   Lab Results  Component Value Date   HDL 59.90 01/30/2017   HDL 46.40 09/15/2015   HDL 52 10/28/2014   Lab Results  Component Value Date   LDLCALC 93 01/30/2017   LDLCALC 81 09/15/2015   LDLCALC 108 (H) 10/28/2014   Lab Results  Component Value Date   TRIG 128.0 01/30/2017   TRIG 119.0 09/15/2015   TRIG 109 10/28/2014   Lab Results  Component Value Date   CHOLHDL 3 01/30/2017   CHOLHDL 3 09/15/2015   CHOLHDL 3.5 10/28/2014   No results found for: LDLDIRECT     Radiology: Dg Chest 2 View  Result Date: 09/30/2017 CLINICAL DATA:  Hypoxemia, 86% oxygen saturation on room air, COPD, chest pain, weakness, dizziness EXAM: CHEST  2 VIEW COMPARISON:  02/19/2017 FINDINGS: Normal heart size, mediastinal contours, and pulmonary vascularity. Minimal atelectasis at RIGHT. Lungs emphysematous but otherwise grossly clear. Atherosclerotic calcifications at aortic arch. No pleural effusion or pneumothorax. IMPRESSION: Emphysematous changes with minimal RIGHT basilar atelectasis. Electronically Signed   By: Lavonia Dana M.D.   On: 09/30/2017 18:19   Dg Knee Complete 4 Views Right  Result Date: 09/30/2017 CLINICAL DATA:  Right knee pain EXAM: RIGHT KNEE - COMPLETE 4+ VIEW COMPARISON:  11/16/2016  FINDINGS: No fracture or malalignment. Marked arthritis involving the medial and patellofemoral compartments with narrow and degenerative spurring. Mild degenerative changes of the lateral compartment with bony spurring present. Trace suprapatellar effusion. IMPRESSION: 1. No acute osseous abnormality 2. Tricompartment arthritis most marked involving the medial and patellofemoral compartments. Trace effusion. Electronically Signed   By: Donavan Foil M.D.   On: 09/30/2017 18:15    EKG: SR no acute ST changes   ASSESSMENT AND PLAN:   Chest Pain:  Recurrent normal myovue 2016 but breast attenuation. This time troponin elevated No acute ECG  Changes. Would agree with body habitus right and left cath best approach to definitively R/O CAD  Have arranged [facility] at [facility] Risks discussed including bleeding , MI, need emergency surgery and stroke Willing to proceed  Pulmonary:  Will need CPAP CXR ok exam basilar atelectasis   Ortho:  Needs Dr Ronnald Ramp to arrange outpatient f/u right knee with small effusion and extensive arthritis may benefit From [facility]/ steroid injection If no CAD can give steroids and NSAI's   Signed: Collier Salina Nishan2/12/2017, 8:02 AM

## 2017-10-01 NOTE — Progress Notes (Addendum)
[facility] for IV heparin (on PTA eliquis) Indication: chest pain/ACS  Allergies  Allergen Reactions  . Baclofen Other (See Comments)    Causes seizures  . Naproxen     Palpitations     Patient Measurements:  Height: 5\' 5"  (165.1 cm) Weight: (!) 350 lb (158.8 kg) IBW/kg (Calculated) : 57wt=158 kg Heparin Dosing Weight: 87 kg  Vital Signs: Temp: 98 F (36.7 C) (02/05 1300) Temp Source: Oral (02/05 1300) BP: 155/80 (02/05 1700) Pulse Rate: 163 (02/05 1146)  Labs: Recent Labs    09/30/17 2253 09/30/17 2312 09/30/17 2353 10/01/17 0120  HGB 14.5 16.3*  --   --   HCT 45.1 48.0*  --   --   PLT 160  --   --   --   APTT  --   --  <20*  --   LABPROT  --   --  14.9  --   INR  --   --  1.18  --   HEPARINUNFRC  --   --  1.76*  --   CREATININE 1.23* 1.20*  --   --   TROPONINI  --   --   --  1.53*    Estimated Creatinine Clearance: 68.2 mL/min (A) (by C-G formula based on SCr of 1.2 mg/dL (H)).  Assessment: 18 yoF on apixaban PTA for hx of PE, now s.p cath, wasn't able to have any intervention d/t profound dyspnea. Pharmacy is consulted to restart heparin 8 hrs after TR band removal (per cath note), spoke with RN, removed TR band at 1456, no bleed issues noted.  Goal of Therapy:  Heparin level 0.3-0.7 units/ml Monitor platelets by anticoagulation protocol: Yes  Aptt=66-102   Plan:  Restart heparin drip at 1100 units/hr with no bolus at 0100 F/u aPTT and heparin level at 0700 Daily heparin level, aPTT and CBC Monitor for s/sx bleeding, Cards plans  Elicia Lamp, PharmD, BCPS Clinical Pharmacist 10/01/2017 6:25 PM

## 2017-10-01 NOTE — ED Provider Notes (Signed)
[facility] [facility] DEPT Provider Note   CSN: [id number] Arrival date & time: 09/30/17  1632     History   Chief Complaint Chief Complaint  Patient presents with  . Leg Pain  . Chest Pain    HPI Janice Morrow is a 72 y.o. female who presents the emergency department chief complaint of chest pain and knee pain. Patient states that she has a history of intermittent chest pain.  She has no known history of heart problems per the patient.  She states that when the pain comes on it feels like there is something very heavy sitting on her chest lasting about 6-8 minutes at a time.  Over the past 2 days it has increased in frequency and duration.  Today the patient had an extensive wait in the waiting room and states that she had an episode lasting "for a very long time.  She had associated pain radiating down the left arm.  She felt short of breath.  She denies nausea or diaphoresis.  The patient is on Eliquis for previous pulmonary embolus.  She states that she has been compliant with the medication other than this morning when she did not take it.  She is also complaining of chronic but worsening right knee pain.  She denies calf pain.  She states that both of her legs have had increased swelling.  She has a history of COPD and has noticed that her shortness of breath has increased recently.  Upon arrival her oxygen saturations were noted to be about 86% and she was wheezing.  She denies hemoptysis.  She is currently chest pain-free.  Patient has noticed burning and frequency of urination as of late.  She denies any suprapubic or flank pain.  She has not had any fevers or chills.  HPI  Past Medical History:  Diagnosis Date  . Acute diastolic CHF (congestive heart failure) ([place]) 08/24/2015  . Cervicalgia   . Chronic back pain   . COPD (chronic obstructive pulmonary disease) ([place])   . Essential hypertension 08/24/2015  . GERD (gastroesophageal reflux disease)   .  Headache(784.0)   . Heart murmur   . History of kidney stones   . Hyperlipidemia   . Migraine without aura, with intractable migraine, so stated, without mention of status migrainosus   . OSA on CPAP    patient denies sleep apnea uses CPAP to get oxygent o brain  . Osteoarthritis   . Pulmonary embolism ([place])   . PVD (peripheral vascular disease) ([place])   . PVD (peripheral vascular disease) ([place])   . Seizures ([place]) 07/2014, 10/2014   due to baclofen  . Shortness of breath dyspnea   . Trigeminal neuralgia     Patient Active Problem List   Diagnosis Date Noted  . Vaginal spotting 05/20/2017  . Type 2 diabetes mellitus with complication, without long-term current use of insulin ([place]) 05/20/2017  . PE (pulmonary thromboembolism) ([place]) 01/30/2017  . Acute cystitis with hematuria 01/30/2017  . Kidney stone on left side   . History of colonic polyps 07/31/2016  . Essential hypertension 08/24/2015  . Seizures ([place]) 08/24/2015  . Obesity hypoventilation syndrome ([place]) 08/24/2015  . Migraine without aura and with status migrainosus, not intractable 07/12/2015  . OSA on CPAP 07/12/2015  . Left ventricular diastolic dysfunction, NYHA class 1 04/11/2015  . Visit for screening mammogram 03/08/2015  . Trigeminal neuralgia of right side of face 11/23/2014  . Chronic back pain 11/26/2013  . Hyperlipidemia with target  LDL less than 130 09/01/2010  . Severe obesity (BMI >= 40) ([place]) 09/01/2010  . COPD (chronic obstructive pulmonary disease) ([place]) 09/01/2010  . GERD 09/01/2010  . OSTEOARTHRITIS 09/01/2010    Past Surgical History:  Procedure Laterality Date  . MULTIPLE TOOTH EXTRACTIONS     "they took out part of my teeth; I took out the rest when they got loose; was suppose to get dentures; never did"  . NO PAST SURGERIES    . URETEROSCOPY WITH HOLMIUM LASER LITHOTRIPSY Left 07/09/2017   Procedure: URETEROSCOPY WITH HOLMIUM LASER LITHOTRIPSY/ STENT;  Surgeon: Festus Aloe, MD;   Location: [facility];  Service: Urology;  Laterality: Left;  . URETEROSCOPY WITH HOLMIUM LASER LITHOTRIPSY Left 07/23/2017   Procedure: URETEROSCOPY WITH HOLMIUM LASER LITHOTRIPSY /STENT;  Surgeon: Festus Aloe, MD;  Location: [facility];  Service: Urology;  Laterality: Left;  NEEDS 60 MINUTES FOR PROCEDURE    OB History    Gravida Para Term Preterm AB Living   4 1 1   3  0   SAB TAB Ectopic Multiple Live Births   3              Obstetric Comments   Dec in [place] Medications    Prior to Admission medications   Medication Sig Start Date End Date Taking? Authorizing Provider  apixaban (ELIQUIS) 5 MG TABS tablet Take 1 tablet (5 mg total) by mouth 2 (two) times daily. Patient taking differently: Take 10 mg by mouth daily.  01/30/17  Yes Janith Lima, MD  carvedilol (COREG) 3.125 MG tablet TAKE 1 TABLET BY MOUTH 2 TIMES DAILY WITH A MEAL 08/07/17  Yes Janith Lima, MD  furosemide (LASIX) 40 MG tablet Take 1 tablet (40 mg total) by mouth daily. 07/19/17  Yes Janith Lima, MD  gabapentin (NEURONTIN) 400 MG capsule Take 400-1,200 mg by mouth See admin instructions. TAKE 1 CAPSULE AT LUNCH, TAKE 2 CAPSULES AT SUPPER AND 3 CAPSULES AT BEDTIME    Yes [provider]  HYDROcodone-acetaminophen (NORCO) 10-325 MG tablet Take 1 tablet by mouth every 6 (six) hours as needed. Patient taking differently: Take 1 tablet by mouth every 6 (six) hours as needed for moderate pain.  09/15/15  Yes Janith Lima, MD  umeclidinium-vilanterol (ANORO ELLIPTA) 62.5-25 MCG/INH AEPB Inhale 1 puff into the lungs daily. 05/10/17  Yes Janith Lima, MD  traMADol (ULTRAM) 50 MG tablet Take 1 tablet (50 mg total) by mouth every 6 (six) hours as needed. Patient not taking: Reported on 09/30/2017 07/23/17 07/23/18  Festus Aloe, MD    Family History Family History  Problem Relation Age of Onset  . Heart attack Father   . Diabetes Mother   . Dementia Mother   . Diabetes Brother   .  Heart disease Unknown   . Diabetes Unknown     Social History Social History   Tobacco Use  . Smoking status: Former Smoker    Types: Cigarettes    Last attempt to quit: 08/22/2010    Years since quitting: 7.1  . Smokeless tobacco: Never Used  Substance Use Topics  . Alcohol use: No    Alcohol/week: 0.0 oz  . Drug use: No     Allergies   Baclofen and Naproxen   Review of Systems Review of Systems  Ten systems reviewed and are negative for acute change, except as noted in the HPI.   Physical Exam Updated Vital Signs BP (!) 130/93 (  BP Location: Left Arm)   Pulse 95   Temp 99.2 F (37.3 C) (Oral)   Resp (!) 21   Ht 5\' 5"  (1.651 m)   Wt (!) 158.8 kg (350 lb)   LMP 08/27/1993 (Approximate)   SpO2 96%   BMI 58.24 kg/m   Physical Exam  Constitutional: She is oriented to person, place, and time. She appears well-developed and well-nourished.  HENT:  Head: Normocephalic and atraumatic.  Eyes: EOM are normal.  Edentulous  Cardiovascular: Normal rate and regular rhythm.  Unable to assess JVD or peripheral edema secondary to the patient's body habitus  Pulmonary/Chest: No tachypnea. She has wheezes.  Breathing is labored, diffuse expiratory wheezes throughout all lung fields  Abdominal: Soft. She exhibits no distension. There is no tenderness.  Musculoskeletal:  Difficult to assess swelling due to body habitus.  Tender with manipulation and range of motion of the left knee  Neurological: She is alert and oriented to person, place, and time.  Skin: Skin is warm and dry.  Nursing note and vitals reviewed.    ED Treatments / Results  Labs (all labs ordered are listed, but only abnormal results are displayed) Labs Reviewed  BASIC METABOLIC PANEL - Abnormal; Notable for the following components:      Result Value   Glucose, Bld 129 (*)    BUN 34 (*)    Creatinine, Ser 1.23 (*)    GFR calc non Af Amer 44 (*)    GFR calc Af Amer 51 (*)    All other components  within normal limits  CBC - Abnormal; Notable for the following components:   WBC 14.8 (*)    RDW 15.9 (*)    All other components within normal limits  APTT - Abnormal; Notable for the following components:   aPTT <20 (*)    All other components within normal limits  I-STAT TROPONIN, ED - Abnormal; Notable for the following components:   Troponin i, poc 1.51 (*)    All other components within normal limits  I-STAT CHEM 8, ED - Abnormal; Notable for the following components:   BUN 39 (*)    Creatinine, Ser 1.20 (*)    Glucose, Bld 129 (*)    Hemoglobin 16.3 (*)    HCT 48.0 (*)    All other components within normal limits  CBG MONITORING, ED - Abnormal; Notable for the following components:   Glucose-Capillary 114 (*)    All other components within normal limits  PROTIME-INR  URINALYSIS, ROUTINE W REFLEX MICROSCOPIC  HEPARIN LEVEL (UNFRACTIONATED)    EKG  EKG Interpretation  Date/Time:  Monday September 30 2017 23:24:54 EST Ventricular Rate:  101 PR Interval:    QRS Duration: 100 QT Interval:  342 QTC Calculation: 444 R Axis:   -53 Text Interpretation:  Sinus tachycardia RSR' in V1 or V2, right VCD or RVH Inferior infarct, old Consider anterior infarct Lateral leads are also involved No acute changes No significant change since last tracing Confirmed by Varney Biles [phone]) on 09/30/2017 11:34:20 PM       Radiology Dg Chest 2 View  Result Date: 09/30/2017 CLINICAL DATA:  Hypoxemia, 86% oxygen saturation on room air, COPD, chest pain, weakness, dizziness EXAM: CHEST  2 VIEW COMPARISON:  02/19/2017 FINDINGS: Normal heart size, mediastinal contours, and pulmonary vascularity. Minimal atelectasis at RIGHT. Lungs emphysematous but otherwise grossly clear. Atherosclerotic calcifications at aortic arch. No pleural effusion or pneumothorax. IMPRESSION: Emphysematous changes with minimal RIGHT basilar atelectasis. Electronically Signed   By:  Lavonia Dana M.D.   On: 09/30/2017 18:19    Dg Knee Complete 4 Views Right  Result Date: 09/30/2017 CLINICAL DATA:  Right knee pain EXAM: RIGHT KNEE - COMPLETE 4+ VIEW COMPARISON:  11/16/2016 FINDINGS: No fracture or malalignment. Marked arthritis involving the medial and patellofemoral compartments with narrow and degenerative spurring. Mild degenerative changes of the lateral compartment with bony spurring present. Trace suprapatellar effusion. IMPRESSION: 1. No acute osseous abnormality 2. Tricompartment arthritis most marked involving the medial and patellofemoral compartments. Trace effusion. Electronically Signed   By: Donavan Foil M.D.   On: 09/30/2017 18:15    Procedures .Critical Care Performed by: Margarita Mail, PA-C Authorized by: Margarita Mail, PA-C   Critical care provider statement:    Critical care time (minutes):  50   Critical care was necessary to treat or prevent imminent or life-threatening deterioration of the following conditions:  Cardiac failure (NSTEMI)   Critical care was time spent personally by me on the following activities:  Blood draw for specimens, development of treatment plan with patient or surrogate, discussions with consultants, evaluation of patient's response to treatment, examination of patient, interpretation of cardiac output measurements, obtaining history from patient or surrogate, ordering and performing treatments and interventions, ordering and review of laboratory studies, ordering and review of radiographic studies, pulse oximetry, re-evaluation of patient's condition and review of old charts   (including critical care time)  Medications Ordered in ED Medications  heparin bolus via infusion 4,000 Units (not administered)  heparin ADULT infusion 100 units/mL (25000 units/233mL sodium chloride 0.45%) (not administered)  aspirin chewable tablet 324 mg (324 mg Oral Given 09/30/17 2349)  ipratropium-albuterol (DUONEB) 0.5-2.5 (3) MG/3ML nebulizer solution 3 mL (3 mLs Nebulization Given  09/30/17 2344)     Initial Impression / Assessment and Plan / ED Course  I have reviewed the triage vital signs and the nursing notes.  Pertinent labs & imaging results that were available during my care of the patient were reviewed by me and considered in my medical decision making (see chart for details).  Clinical Course as of Oct 01 637  Tue Oct 01, 2017  0112 Creatinine: (!) 1.23 [AH]  6644 Troponin I: (!!) 1.53 [AH]    Clinical Course User Index [AH] Margarita Mail, PA-C     Patient with probable NSTEMI.  Given her sxs of CP with radiation/ sob, sxs sound cardiac and troponin is elevated. I have started the patient on tx for NSTEMI with heparin. The patient will be admitted by the Cardiology fellow for NSTEMI . She is stable through her ed course. She will be admitted to [facility] and need a Heart Cath.   Final Clinical Impressions(s) / ED Diagnoses   Final diagnoses:  NSTEMI (non-ST elevated myocardial infarction) [facility])    ED Discharge Orders    None       Margarita Mail, PA-C 10/01/17 0347    Varney Biles, MD 10/01/17 [phone]

## 2017-10-01 NOTE — Progress Notes (Addendum)
NSTEMI REVIEWED  PT INFO  ADVISED  TRANSFER   WILL PLACE  ON POSSIBLE  CATH AM LIST  ADVISED  HEP GTT, HOLD  ELIQUIS

## 2017-10-01 NOTE — Progress Notes (Signed)
ANTICOAGULATION CONSULT NOTE - Initial Consult  Pharmacy Consult for IV heparin (on PTA eliquis) Indication: chest pain/ACS  Allergies  Allergen Reactions  . Baclofen Other (See Comments)    Causes seizures  . Naproxen     Palpitations     Patient Measurements:  Height: 5\' 5"  (165.1 cm) Weight: (!) 350 lb (158.8 kg) IBW/kg (Calculated) : 57wt=158 kg Heparin Dosing Weight: 87 kg  Vital Signs: BP: 145/95 (02/05 1146) Pulse Rate: 163 (02/05 1146)  Labs: Recent Labs    09/30/17 2253 09/30/17 2312 09/30/17 2353 10/01/17 0120  HGB 14.5 16.3*  --   --   HCT 45.1 48.0*  --   --   PLT 160  --   --   --   APTT  --   --  <20*  --   LABPROT  --   --  14.9  --   INR  --   --  1.18  --   HEPARINUNFRC  --   --  1.76*  --   CREATININE 1.23* 1.20*  --   --   TROPONINI  --   --   --  1.53*    Estimated Creatinine Clearance: 68.2 mL/min (A) (by C-G formula based on SCr of 1.2 mg/dL (H)).  Assessment: 85 yoF c/o SOB and CP, s.p cath, wasn't able to have any intervention d/t profound dyspnea. Pharmacy is consulted to restart heparin 8 hrs after TR band removal, spoke with RN, likely will remove TR band after 2-3 more hrs at around 6PM.    Noted PTA eliquis for hx of PE.  LD 2/3 unsure of time. Pt taking 10 mg once daily vs prescribed 5 mg bid. May need to use aPtt to follow d/t recent eliquis use.  2/4 Baseline labs: HL=1.76, aptt= <20, Plts=160, H/H WNL  Goal of Therapy:  Heparin level 0.3-0.7 units/ml Monitor platelets by anticoagulation protocol: Yes  Aptt=66-102   Plan:  ReStart heparin drip at 1100 units/hr with no bolus at 0200 F/u aPTT and heparin level at 0800 on 2/6 Daily heparin level, aPTT and CBC F/u plans for repeat cath?   Maryanna Shape, PharmD, BCPS  Clinical Pharmacist  Pager: [phone]   10/01/2017,2:55 PM

## 2017-10-01 NOTE — Progress Notes (Signed)
CPAP machine at bedside for patient use.  Pt declined use at this time.  Will continue to monitor and offer use. Pt resting comfortably with no distress at time set up.

## 2017-10-01 NOTE — Interval H&P Note (Signed)
History and Physical Interval Note:  10/01/2017 10:02 AM  Janice Morrow  has presented today for surgery, with the diagnosis of nstemi with dyspnea.   The various methods of treatment have been discussed with the patient and family. After consideration of risks, benefits and other options for treatment, the patient has consented to  Procedure(s): RIGHT/LEFT HEART CATH AND CORONARY ANGIOGRAPHY (N/A) with possible PERCUTANEOUS CORONARY INTERVENTION as a surgical intervention .  The patient's history has been reviewed, patient examined, no change in status, stable for surgery.  I have reviewed the patient's chart and labs.  Questions were answered to the patient's satisfaction.    Cath Lab Visit (complete for each Cath Lab visit)  Clinical Evaluation Leading to the Procedure:   ACS: Yes.    Non-ACS:    Anginal Classification: CCS IV  Anti-ischemic medical therapy: Minimal Therapy (1 class of medications)  Non-Invasive Test Results: Low-risk stress test findings: cardiac mortality <1%/year  Prior CABG: No previous CABG   Glenetta Hew

## 2017-10-02 ENCOUNTER — Inpatient Hospital Stay (HOSPITAL_COMMUNITY): Payer: Medicare Other

## 2017-10-02 DIAGNOSIS — J9610 Chronic respiratory failure, unspecified whether with hypoxia or hypercapnia: Secondary | ICD-10-CM

## 2017-10-02 LAB — HEPARIN LEVEL (UNFRACTIONATED)
HEPARIN UNFRACTIONATED: 0.24 [IU]/mL — AB (ref 0.30–0.70)
Heparin Unfractionated: 0.31 IU/mL (ref 0.30–0.70)

## 2017-10-02 LAB — CBC
HCT: 43.3 % (ref 36.0–46.0)
Hemoglobin: 13 g/dL (ref 12.0–15.0)
MCH: 27.5 pg (ref 26.0–34.0)
MCHC: 30 g/dL (ref 30.0–36.0)
MCV: 91.5 fL (ref 78.0–100.0)
PLATELETS: 178 10*3/uL (ref 150–400)
RBC: 4.73 MIL/uL (ref 3.87–5.11)
RDW: 16.1 % — ABNORMAL HIGH (ref 11.5–15.5)
WBC: 9.7 10*3/uL (ref 4.0–10.5)

## 2017-10-02 LAB — BASIC METABOLIC PANEL
Anion gap: 10 (ref 5–15)
BUN: 21 mg/dL — ABNORMAL HIGH (ref 6–20)
CALCIUM: 9 mg/dL (ref 8.9–10.3)
CO2: 26 mmol/L (ref 22–32)
Chloride: 102 mmol/L (ref 101–111)
Creatinine, Ser: 0.87 mg/dL (ref 0.44–1.00)
GFR calc non Af Amer: >60 mL/min (ref >60)
Glucose, Bld: 109 mg/dL — ABNORMAL HIGH (ref 65–99)
Potassium: 4.6 mmol/L (ref 3.5–5.1)
SODIUM: 138 mmol/L (ref 135–145)

## 2017-10-02 LAB — APTT
APTT: 23 s — AB (ref 24–36)
APTT: 30 s (ref 24–36)

## 2017-10-02 MED ORDER — GABAPENTIN 400 MG PO CAPS
1200.0000 mg | ORAL_CAPSULE | Freq: Every day | ORAL | Status: DC
  Administered 2017-10-02: 1200 mg via ORAL
  Filled 2017-10-02: qty 3

## 2017-10-02 MED ORDER — GABAPENTIN 400 MG PO CAPS
800.0000 mg | ORAL_CAPSULE | Freq: Every day | ORAL | Status: DC
  Administered 2017-10-02: 800 mg via ORAL
  Filled 2017-10-02: qty 2

## 2017-10-02 MED ORDER — HYDROCODONE-ACETAMINOPHEN 10-325 MG PO TABS
1.0000 | ORAL_TABLET | Freq: Four times a day (QID) | ORAL | Status: DC | PRN
  Administered 2017-10-02 – 2017-10-03 (×3): 1 via ORAL
  Filled 2017-10-02 (×3): qty 1

## 2017-10-02 MED ORDER — CYCLOBENZAPRINE HCL 10 MG PO TABS: 5.0000 mg | ORAL_TABLET | Freq: Three times a day (TID) | ORAL | Status: DC | PRN

## 2017-10-02 MED ORDER — FUROSEMIDE 40 MG PO TABS
40.0000 mg | ORAL_TABLET | Freq: Every day | ORAL | Status: DC
  Filled 2017-10-02: qty 1

## 2017-10-02 MED ORDER — GABAPENTIN 400 MG PO CAPS
400.0000 mg | ORAL_CAPSULE | Freq: Every day | ORAL | Status: DC
  Administered 2017-10-02 – 2017-10-03 (×2): 400 mg via ORAL
  Filled 2017-10-02 (×2): qty 1

## 2017-10-02 MED ORDER — FUROSEMIDE 10 MG/ML IJ SOLN
40.0000 mg | Freq: Once | INTRAMUSCULAR | Status: AC
  Administered 2017-10-02: 40 mg via INTRAVENOUS
  Filled 2017-10-02: qty 4

## 2017-10-02 MED ORDER — METHYLPREDNISOLONE SODIUM SUCC 40 MG IJ SOLR
40.0000 mg | Freq: Once | INTRAMUSCULAR | Status: AC
  Administered 2017-10-02: 40 mg via INTRAVENOUS
  Filled 2017-10-02: qty 1

## 2017-10-02 NOTE — Progress Notes (Signed)
PULMONARY / CRITICAL CARE MEDICINE   Name: Janice Morrow MRN: [id number] DOB: 1946/02/10    ADMISSION DATE:  09/30/2017   HISTORY OF PRESENT ILLNESS:   72 y/o F admitted 2/4 with reports of chest pain, weakness, dizziness, and leg pain.    Reports she quit smoking in 2011, smoked a pack of cigarettes every 3 days since starting in early childhood.  At baseline, wears 3L O2 at home for the last three years due to her COPD.  She walks with a cane and has 4-5 pillow orthopnea and reports shortness of breath since childhood.  On Eliquis since July 2018 for pulmonary embolism.   She initially reported to the ER with complaints of right knee pain that radiated down her leg. She denied known injury. She later stated she had pressure / heaviness in her chest lasting 6-8 minutes at a time.  She also reported shortness of breath.  She is on Eliquis for prior PE. She was found to have elevated troponin (1.53) and admitted for further evaluation.  EKG did not show acute ST changes. The patient was taken to the cath lab on 2/5 for LHC.  During the process of transfer to the cath table, she became dyspneic and required NRB. CXR on admit was negative for acute process.  Due to dyspnea and increased O2 needs, PCCM consulted for evaluation.     Patient reports some decrease in shortness of breath today. She denies chest pain.  PAST MEDICAL HISTORY :  She  has a past medical history of Acute diastolic CHF (congestive heart failure) ([place]) (08/24/2015), Cervicalgia, Chronic back pain, COPD (chronic obstructive pulmonary disease) ([place]), Essential hypertension (08/24/2015), GERD (gastroesophageal reflux disease), Headache(784.0), Heart murmur, History of kidney stones, Hyperlipidemia, Migraine without aura, with intractable migraine, so stated, without mention of status migrainosus, OSA on CPAP, Osteoarthritis, Pulmonary embolism ([place]), PVD (peripheral vascular disease) ([place]), PVD (peripheral vascular disease) ([place]),  Seizures ([place]) (07/2014, 10/2014), Shortness of breath dyspnea, and Trigeminal neuralgia.  PAST SURGICAL HISTORY: She  has a past surgical history that includes Multiple tooth extractions; No past surgeries; Ureteroscopy with holmium laser lithotripsy (Left, 07/09/2017); Ureteroscopy with holmium laser lithotripsy (Left, 07/23/2017); and LEFT HEART CATH AND CORONARY ANGIOGRAPHY (N/A, 10/01/2017).  Allergies  Allergen Reactions  . Baclofen Other (See Comments)    Causes seizures  . Naproxen     Palpitations     No current facility-administered medications on file prior to encounter.    Current Outpatient Medications on File Prior to Encounter  Medication Sig  . apixaban (ELIQUIS) 5 MG TABS tablet Take 1 tablet (5 mg total) by mouth 2 (two) times daily. (Patient taking differently: Take 10 mg by mouth daily. )  . carvedilol (COREG) 3.125 MG tablet TAKE 1 TABLET BY MOUTH 2 TIMES DAILY WITH A MEAL  . furosemide (LASIX) 40 MG tablet Take 1 tablet (40 mg total) by mouth daily.  [name] gabapentin (NEURONTIN) 400 MG capsule Take 400-1,200 mg by mouth See admin instructions. TAKE 1 CAPSULE AT LUNCH, TAKE 2 CAPSULES AT SUPPER AND 3 CAPSULES AT BEDTIME   . HYDROcodone-acetaminophen (NORCO) 10-325 MG tablet Take 1 tablet by mouth every 6 (six) hours as needed. (Patient taking differently: Take 1 tablet by mouth every 6 (six) hours as needed for moderate pain. )  . umeclidinium-vilanterol (ANORO ELLIPTA) 62.5-25 MCG/INH AEPB Inhale 1 puff into the lungs daily.  . traMADol (ULTRAM) 50 MG tablet Take 1 tablet (50 mg total) by mouth every 6 (six) hours as  needed. (Patient not taking: Reported on 09/30/2017)    FAMILY HISTORY:  Her indicated that her mother is alive. She indicated that her father is deceased. She indicated that all of her three sisters are alive. She indicated that all of her four brothers are alive. She indicated that her daughter is deceased. She indicated that her other is deceased.   SOCIAL  HISTORY: She  reports that she quit smoking about 7 years ago. Her smoking use included cigarettes. she has never used smokeless tobacco. She reports that she does not drink alcohol or use drugs.  REVIEW OF SYSTEMS:   General: awake and alert Pulmonary: + for shortness of breath Cardiac: negative for chest pain or palipations.   VITAL SIGNS: BP 122/76   Pulse 89   Temp 98.8 F (37.1 C) (Tympanic)   Resp (!) 21   Ht 5\' 5"  (1.651 m)   Wt (!) 158.8 kg (350 lb)   LMP 08/27/1993 (Approximate)   SpO2 97%   BMI 58.24 kg/m   HEMODYNAMICS:    VENTILATOR SETTINGS: FiO2 (%):  [32 %] 32 %  INTAKE / OUTPUT: I/O last 3 completed shifts: In: 504.8 [P.O.:240; I.V.:264.8] Out: 1000 [Urine:1000]  PHYSICAL EXAMINATION: General:  Patient sitting upright in bed. No distress observed. Neuro:  Awake, alert and oriented. HEENT:  No jvd or icterus. Cardiovascular:  S1s2. No murmur, gallop or rub. Lungs:  Bilateral coarse crackles at the bases. No wheezes or rhonchi. Abdomen:  Obese, non tender. Musculoskeletal:  Obese legs. + pedal edema. Skin:  Warm to touch. No cyanosis.  LABS:  BMET Recent Labs  Lab 09/30/17 2253 09/30/17 2312  NA 138 140  K 4.3 4.3  CL 103 102  CO2 27  --   BUN 34* 39*  CREATININE 1.23* 1.20*  GLUCOSE 129* 129*    Electrolytes Recent Labs  Lab 09/30/17 2253  CALCIUM 9.2    CBC Recent Labs  Lab 09/30/17 2253 09/30/17 2312 10/02/17 0342  WBC 14.8*  --  9.7  HGB 14.5 16.3* 13.0  HCT 45.1 48.0* 43.3  PLT 160  --  178    Coag's Recent Labs  Lab 09/30/17 2353  APTT <20*  INR 1.18    Sepsis Markers No results for input(s): LATICACIDVEN, PROCALCITON, O2SATVEN in the last 168 hours.  ABG No results for input(s): PHART, PCO2ART, PO2ART in the last 168 hours.  Liver Enzymes No results for input(s): AST, ALT, ALKPHOS, BILITOT, ALBUMIN in the last 168 hours.  Cardiac Enzymes Recent Labs  Lab 10/01/17 0120  TROPONINI 1.53*     Glucose Recent Labs  Lab 09/30/17 2359  GLUCAP 114*    Imaging Dg Chest Portable 1 View  Result Date: 10/02/2017 CLINICAL DATA:  Cough EXAM: PORTABLE CHEST 1 VIEW COMPARISON:  Chest x-ray of 09/30/2017 FINDINGS: Somewhat prominent hila are stable most consistent with a degree of pulmonary arterial hypertension. No pneumonia or effusion is seen. Mild cardiomegaly is stable. No bony abnormality is seen. IMPRESSION: Stable chest x-ray with mild cardiomegaly. No definite active process. Electronically Signed   By: Ivar Drape M.D.   On: 10/02/2017 09:45    SIGNIFICANT EVENTS: None  DISCUSSION: 72 y.o female s/p cardiac cath for chest pain, also with chronic dyspnea and COPD.  ASSESSMENT / PLAN:  PULMONARY A: 1. Dyspnea 2. COPD 3. PE on anticoagulation 4. OSA on cpap. P:   1. Continue Anoro Ellipta. Should continue at discharge. 2. PRN albuterol 3. Patient on home oxygen with CPAP. Can d/c  during the day if spo2 is > 90%. 4. Echo performed 4/18 did not reveal pulmonary HTN. So less likely to suspect dyspnea secondary to chronic thromboembolic pulmonary HTN. 5. Recommend outpatient pulmonary follow up. 6. Resume eliquis at discharge.  CARDIOVASCULAR A:  1. Diastolic CHF P:  2. Management per the cardiology service.  Will sign off at this time. Please re consult if further evaluation is required.   Christena Deem. Sabra Heck, MD  Pulmonary and [place] Pager: [phone]  10/02/2017, 10:23 AM

## 2017-10-02 NOTE — Progress Notes (Addendum)
[place] for IV heparin (on PTA eliquis) Indication: pulmonary embolus/ACS  Allergies  Allergen Reactions  . Baclofen Other (See Comments)    Causes seizures  . Naproxen     Palpitations     Patient Measurements:  Height: 5\' 5"  (165.1 cm) Weight: (!) 350 lb (158.8 kg) IBW/kg (Calculated) : 57wt=158 kg Heparin Dosing Weight: 87 kg  Vital Signs: Temp: 98.8 F (37.1 C) (02/06 0320) Temp Source: Tympanic (02/06 0320) BP: 122/76 (02/06 0958) Pulse Rate: 89 (02/06 0958)  Labs: Recent Labs    09/30/17 2253 09/30/17 2312 09/30/17 2353 10/01/17 0120 10/02/17 0342 10/02/17 0930  HGB 14.5 16.3*  --   --  13.0  --   HCT 45.1 48.0*  --   --  43.3  --   PLT 160  --   --   --  178  --   APTT  --   --  <20*  --   --   --   LABPROT  --   --  14.9  --   --   --   INR  --   --  1.18  --   --   --   HEPARINUNFRC  --   --  1.76*  --   --  0.31  CREATININE 1.23* 1.20*  --   --   --   --   TROPONINI  --   --   --  1.53*  --   --     Estimated Creatinine Clearance: 68.2 mL/min (A) (by C-G formula based on SCr of 1.2 mg/dL (H)).  Assessment: 16 yoF on apixaban PTA for hx of PE, now s.p cath, wasn't able to have any intervention d/t profound dyspnea. Heparin was restarted 8 hours post TR band removal. APTT is subtherapeutic at 23 and does not correlate with HL of 0.31. Will continue to follow APTT for now. H/H and plt wnl   Goal of Therapy:  Heparin level 0.3-0.7 units/ml Monitor platelets by anticoagulation protocol: Yes  Aptt=66-102   Plan:  Increase heparin infusion to 1350 units/hr  F/u 8 hr HL and APTT  Daily heparin level, aPTT and CBC Monitor for s/sx bleeding, Cards plans  Albertina Parr, PharmD., BCPS Clinical Pharmacist Pager [phone]

## 2017-10-02 NOTE — Progress Notes (Signed)
[place] for IV heparin (on PTA eliquis) Indication: pulmonary embolus/ACS  Allergies  Allergen Reactions  . Baclofen Other (See Comments)    Causes seizures  . Naproxen     Palpitations     Patient Measurements:  Height: 5\' 5"  (165.1 cm) Weight: (!) 350 lb (158.8 kg) IBW/kg (Calculated) : 57wt=158 kg Heparin Dosing Weight: 87 kg  Vital Signs: Temp: 98.8 F (37.1 C) (02/06 2100) Temp Source: Oral (02/06 2100) BP: 107/71 (02/06 2100) Pulse Rate: 77 (02/06 2100)  Labs: Recent Labs    09/30/17 2253 09/30/17 2312 09/30/17 2353 10/01/17 0120 10/02/17 0342 10/02/17 0930 10/02/17 0945 10/02/17 1100 10/02/17 1950  HGB 14.5 16.3*  --   --  13.0  --   --   --   --   HCT 45.1 48.0*  --   --  43.3  --   --   --   --   PLT 160  --   --   --  178  --   --   --   --   APTT  --   --  <20*  --   --   --  23*  --   --   LABPROT  --   --  14.9  --   --   --   --   --   --   INR  --   --  1.18  --   --   --   --   --   --   HEPARINUNFRC  --   --  1.76*  --   --  0.31  --   --  0.24*  CREATININE 1.23* 1.20*  --   --   --   --   --  0.87  --   TROPONINI  --   --   --  1.53*  --   --   --   --   --     Estimated Creatinine Clearance: 94.1 mL/min (by C-G formula based on SCr of 0.87 mg/dL).  Assessment: 49 yoF on apixaban PTA for hx of PE, now s.p cath, wasn't able to have any intervention d/t profound dyspnea. Heparin was restarted 8 hours post TR band removal.  Follow up heparin level this evening is low at 0.24, effects of previous apixaban likely cleared.   Goal of Therapy:  Heparin level 0.3-0.7 units/ml Monitor platelets by anticoagulation protocol: Yes  Aptt=66-102s   Plan:  Increase heparin infusion to 1550 units/hr  F/u 8 hr HL and APTT  Daily heparin level, aPTT and CBC Monitor for s/sx bleeding, Cards plans  Erin Hearing PharmD., BCPS Clinical Pharmacist 10/02/2017 9:43 PM

## 2017-10-02 NOTE — Progress Notes (Addendum)
Progress Note  Patient Name: Janice Morrow Date of Encounter: 10/02/2017  Primary Cardiologist: Kirk Ruths, MD   Subjective   Patient complaining of upper back and R knee pain this AM, limiting her ability to move in bed. Asking for home [place] she is still having SOB. Denies chest pressure this AM.   Inpatient Medications    Scheduled Meds: . aspirin EC  81 mg Oral Daily  . carvedilol  3.125 mg Oral BID WC  . furosemide  40 mg Oral BID  . gabapentin  1,200 mg Oral QHS  . gabapentin  400 mg Oral Q lunch  . gabapentin  800 mg Oral Q supper  . nitroGLYCERIN  1 inch Topical Q6H  . sodium chloride flush  3 mL Intravenous Q12H  . umeclidinium-vilanterol  1 puff Inhalation Daily   Continuous Infusions: . sodium chloride 10 mL/hr at 10/01/17 0348  . sodium chloride    . heparin 1,100 Units/hr (10/02/17 0315)   PRN Meds: sodium chloride, acetaminophen, guaiFENesin-dextromethorphan, nitroGLYCERIN, ondansetron (ZOFRAN) IV, sodium chloride flush   Vital Signs    Vitals:   10/01/17 2356 10/02/17 0320 10/02/17 0322 10/02/17 0843  BP: 99/84 (!) 85/75 102/79 111/80  Pulse: 91 80  89  Resp:    19  Temp: 98.8 F (37.1 C) 98.8 F (37.1 C)    TempSrc: Oral Tympanic    SpO2: 98%   97%  Weight:      Height:        Intake/Output Summary (Last 24 hours) at 10/02/2017 1610 Last data filed at 10/02/2017 0700 Gross per 24 hour  Intake 504.75 ml  Output 1000 ml  Net -495.25 ml   Filed Weights   10/01/17 0000  Weight: (!) 350 lb (158.8 kg)    Telemetry    NSR, occasional PVCs - Personally Reviewed  Physical Exam   GEN: Morbidly obese elderly female, laying in bed in no acute distress.   Neck: No JVD appreciated, no carotid bruits Cardiac: RRR, no murmurs, rubs, or gallops.  Respiratory: Mild crackles at lung bases, no wheezes/ rhonchi GI: NABS, obese, soft, nontender, non-distended  MS: R knee without erythema, ecchymosis, or increased warmth. Difficult to  appreciate effusion given body habitus. 1-2+ LE edema. No deformity. R radial cath site without erythema, ecchymosis, or hematoma. Neuro:  Nonfocal, moving all extremities spontaneously Psych: Normal affect   Labs    Chemistry Recent Labs  Lab 09/30/17 2253 09/30/17 2312  NA 138 140  K 4.3 4.3  CL 103 102  CO2 27  --   GLUCOSE 129* 129*  BUN 34* 39*  CREATININE 1.23* 1.20*  CALCIUM 9.2  --   GFRNONAA 44*  --   GFRAA 51*  --   ANIONGAP 8  --      Hematology Recent Labs  Lab 09/30/17 2253 09/30/17 2312 10/02/17 0342  WBC 14.8*  --  9.7  RBC 5.04  --  4.73  HGB 14.5 16.3* 13.0  HCT 45.1 48.0* 43.3  MCV 89.5  --  91.5  MCH 28.8  --  27.5  MCHC 32.2  --  30.0  RDW 15.9*  --  16.1*  PLT 160  --  178    Cardiac Enzymes Recent Labs  Lab 10/01/17 0120  TROPONINI 1.53*    Recent Labs  Lab 09/30/17 2310  TROPIPOC 1.51*     BNPNo results for input(s): BNP, PROBNP in the last 168 hours.   DDimer No results for input(s): DDIMER  in the last 168 hours.   Radiology    Dg Chest 2 View  Result Date: 09/30/2017 CLINICAL DATA:  Hypoxemia, 86% oxygen saturation on room air, COPD, chest pain, weakness, dizziness EXAM: CHEST  2 VIEW COMPARISON:  02/19/2017 FINDINGS: Normal heart size, mediastinal contours, and pulmonary vascularity. Minimal atelectasis at RIGHT. Lungs emphysematous but otherwise grossly clear. Atherosclerotic calcifications at aortic arch. No pleural effusion or pneumothorax. IMPRESSION: Emphysematous changes with minimal RIGHT basilar atelectasis. Electronically Signed   By: Lavonia Dana M.D.   On: 09/30/2017 18:19   Dg Knee Complete 4 Views Right  Result Date: 09/30/2017 CLINICAL DATA:  Right knee pain EXAM: RIGHT KNEE - COMPLETE 4+ VIEW COMPARISON:  11/16/2016 FINDINGS: No fracture or malalignment. Marked arthritis involving the medial and patellofemoral compartments with narrow and degenerative spurring. Mild degenerative changes of the lateral compartment  with bony spurring present. Trace suprapatellar effusion. IMPRESSION: 1. No acute osseous abnormality 2. Tricompartment arthritis most marked involving the medial and patellofemoral compartments. Trace effusion. Electronically Signed   By: Donavan Foil M.D.   On: 09/30/2017 18:15    Cardiac Studies   Left Heart Cath 10/01/17: Conclusion     There is mild to moderate left ventricular systolic dysfunction.  LV end diastolic pressure is severely elevated. EDP 25-30 mmHg  The left ventricular ejection fraction is 40-45% by visual estimate. Global hypokinesis  There is trace-mild (2+) mitral regurgitation.  Ost Cx to Prox Cx lesion is 30% stenosed. Otherwise minimal CAD with a large wraparound LAD.   Angiographically minimal CAD with right dominant system.,  But large wraparound LAD. Moderate to severely elevated LVEDP.  Suspect troponin elevation was related to microvascular ischemia in the setting of acute (on chronic) combined systolic and diastolic heart failure. Unable to obtain venous access for right heart cath due to patient discomfort and respiratory distress, requiring nonrebreather oxygen I did not feel it was safe to keep her on the table to attempt further venous access after several attempts were made using ultrasound.  Plan:  Patient will be admitted to stepdown unit for IV diuresis.  I written for 40 mg IV Lasix twice daily.  Based on that positive troponin and unclear etiology, I have written to restart IV heparin 8 hours after TR band removal.  Will defer duration of treatment to primary cardiology team.  Defer further management to primary team  Consider evaluation for sleep apnea using CPAP based on patient's significant orthopnea.      Patient Profile     72 y.o. female with PMH of chronic diastolic CHF, HTN, COPD on home O2 3L, PE on eliquis, OSA on CPAP, morbid obesity who presents with complaints of SSCP for months, worsening over the past week, as well as  R knee pain.   Assessment & Plan    1. Chest pain: patient complained of intermittent chest pressure for the past month, worse over the past week.  - Trop 1.51>1.53  - EKG without STE/D - s/p LHC 10/01/17 with minimal CAD; EF 40-45%, and EDP 25-39mmHg. Unable to perform RHC due to patient discomfort and respiratory distress requiring NRB.  - Will discontinue nitro ointment given minimal CAD on cath.  2. Acute on chronic hypoxic respiratory failure 2/2 acute on chronic diastolic CHF: patient with acute onset respiratory distress 10/01/17 during attempts to complete heart catheterization. Placed on a NRB for a short time and given po Lasix with some improvement. With some mild crackles at lung bases and 1+ LE edema.  -  Appreciate crit care recommendations - I&Os with net -477mL - Will give a dose of IV lasix this AM and monitor for improvement - Continue home carvedilol  3. COPD: does not appear to have an acute exacerbation - Continue home anoro ellipta - Wean O2 to home 3L as tolerated - goal O2 sat 90-92%  4. R knee pain: patient has a history of arthritis and reports increased swelling x1 week, limiting ability to ambulate or move in bed. Patient is morbidly obese making it difficult to appreciate effusion. Knee is without erythema or warmth.  - Would likely benefit from knee injection - inpt vs outpt?  5. Chronic back pain: patient reports worsening back pain this AM. Likely due being off home medications - Will restart home gabapentin, norco, and flexeril    6. CKD stage 3: Cr 1.20 prior to cath - Will plan to recheck Cr with next blood draw now that patient is post cath  7. History of PE:  - Currently on heparin gtt - can likely transition back to home eliquis once no plans for further procedure (knee injection?)  For questions or updates, please contact [place] Please consult [email or web address] for contact info under Cardiology/STEMI.      Signed, Abigail Butts, PA-C    10/02/2017, 9:06 AM   [phone]  Patient examined chart reviewed. Primary complaint right knee pain Less dyspnea Cath with no CAD EF low normal. Primary issues are morbid obesity lung disease and knee arthritis Will give one time Dose solumedrol for knee effusion Will plan d/c in am with f/u Dr Ronnald Ramp who can arrange orthopedic  Evaluation and f/u with [facility]   Jenkins Rouge

## 2017-10-03 LAB — CBC
HCT: 44.7 % (ref 36.0–46.0)
Hemoglobin: 13.1 g/dL (ref 12.0–15.0)
MCH: 27.2 pg (ref 26.0–34.0)
MCHC: 29.3 g/dL — AB (ref 30.0–36.0)
MCV: 92.9 fL (ref 78.0–100.0)
Platelets: 184 10*3/uL (ref 150–400)
RBC: 4.81 MIL/uL (ref 3.87–5.11)
RDW: 15.9 % — ABNORMAL HIGH (ref 11.5–15.5)
WBC: 9.4 10*3/uL (ref 4.0–10.5)

## 2017-10-03 LAB — APTT: aPTT: 46 seconds — ABNORMAL HIGH (ref 24–36)

## 2017-10-03 LAB — HEPARIN LEVEL (UNFRACTIONATED): Heparin Unfractionated: 0.41 IU/mL (ref 0.30–0.70)

## 2017-10-03 MED ORDER — APIXABAN 5 MG PO TABS
5.0000 mg | ORAL_TABLET | Freq: Two times a day (BID) | ORAL | Status: DC
  Administered 2017-10-03: 5 mg via ORAL
  Filled 2017-10-03: qty 1

## 2017-10-03 MED ORDER — APIXABAN 5 MG PO TABS: 5.0000 mg | ORAL_TABLET | Freq: Two times a day (BID) | ORAL | Status: DC

## 2017-10-03 MED FILL — Heparin Sodium (Porcine) 2 Unit/ML in Sodium Chloride 0.9%: INTRAMUSCULAR | Qty: 500 | Status: AC

## 2017-10-03 NOTE — Progress Notes (Signed)
Progress Note  Patient Name: Janice Morrow Date of Encounter: 10/03/2017  Primary Cardiologist: Kirk Ruths, MD   Subjective   No complaints steroid iv helped right knee   Inpatient Medications    Scheduled Meds: . aspirin EC  81 mg Oral Daily  . carvedilol  3.125 mg Oral BID WC  . furosemide  40 mg Oral Daily  . gabapentin  1,200 mg Oral QHS  . gabapentin  400 mg Oral Q lunch  . gabapentin  800 mg Oral Q supper  . sodium chloride flush  3 mL Intravenous Q12H  . umeclidinium-vilanterol  1 puff Inhalation Daily   Continuous Infusions: . sodium chloride 10 mL/hr at 10/01/17 0348  . sodium chloride     PRN Meds: sodium chloride, acetaminophen, cyclobenzaprine, guaiFENesin-dextromethorphan, HYDROcodone-acetaminophen, nitroGLYCERIN, ondansetron (ZOFRAN) IV, sodium chloride flush   Vital Signs    Vitals:   10/03/17 0014 10/03/17 0226 10/03/17 0333 10/03/17 0500  BP:  91/80  102/69  Pulse: 81     Resp: 18 19  15   Temp:  97.9 F (36.6 C)  97.7 F (36.5 C)  TempSrc:  Oral  Oral  SpO2: 94% 93%  93%  Weight:   (!) 340 lb 6.2 oz (154.4 kg)   Height:        Intake/Output Summary (Last 24 hours) at 10/03/2017 0810 Last data filed at 10/03/2017 0600 Gross per 24 hour  Intake 661.83 ml  Output 2250 ml  Net -1588.17 ml   Filed Weights   10/01/17 0000 10/03/17 0333  Weight: (!) 350 lb (158.8 kg) (!) 340 lb 6.2 oz (154.4 kg)    Telemetry    NSR, occasional PVCs - Personally Reviewed  Physical Exam   GEN: Morbidly obese elderly female, laying in bed in no acute distress.   Neck: No JVD appreciated, no carotid bruits Cardiac: RRR, no murmurs, rubs, or gallops.  Respiratory: Mild crackles at lung bases, no wheezes/ rhonchi GI: NABS, obese, soft, nontender, non-distended  MS: R knee without erythema, ecchymosis, or increased warmth. Difficult to appreciate effusion given body habitus. 1-2+ LE edema. No deformity. R radial cath site without erythema, ecchymosis, or  hematoma. Neuro:  Nonfocal, moving all extremities spontaneously Psych: Normal affect   Labs    Chemistry Recent Labs  Lab 09/30/17 2253 09/30/17 2312 10/02/17 1100  NA 138 140 138  K 4.3 4.3 4.6  CL 103 102 102  CO2 27  --  26  GLUCOSE 129* 129* 109*  BUN 34* 39* 21*  CREATININE 1.23* 1.20* 0.87  CALCIUM 9.2  --  9.0  GFRNONAA 44*  --  >60  GFRAA 51*  --  >60  ANIONGAP 8  --  10     Hematology Recent Labs  Lab 09/30/17 2253 09/30/17 2312 10/02/17 0342 10/03/17 0709  WBC 14.8*  --  9.7 9.4  RBC 5.04  --  4.73 4.81  HGB 14.5 16.3* 13.0 13.1  HCT 45.1 48.0* 43.3 44.7  MCV 89.5  --  91.5 92.9  MCH 28.8  --  27.5 27.2  MCHC 32.2  --  30.0 29.3*  RDW 15.9*  --  16.1* 15.9*  PLT 160  --  178 184    Cardiac Enzymes Recent Labs  Lab 10/01/17 0120  TROPONINI 1.53*    Recent Labs  Lab 09/30/17 2310  TROPIPOC 1.51*     BNPNo results for input(s): BNP, PROBNP in the last 168 hours.   DDimer No results for input(s): DDIMER in  the last 168 hours.   Radiology    Dg Chest Portable 1 View  Result Date: 10/02/2017 CLINICAL DATA:  Cough EXAM: PORTABLE CHEST 1 VIEW COMPARISON:  Chest x-ray of 09/30/2017 FINDINGS: Somewhat prominent hila are stable most consistent with a degree of pulmonary arterial hypertension. No pneumonia or effusion is seen. Mild cardiomegaly is stable. No bony abnormality is seen. IMPRESSION: Stable chest x-ray with mild cardiomegaly. No definite active process. Electronically Signed   By: Ivar Drape M.D.   On: 10/02/2017 09:45    Cardiac Studies   Left Heart Cath 10/01/17: Conclusion     There is mild to moderate left ventricular systolic dysfunction.  LV end diastolic pressure is severely elevated. EDP 25-30 mmHg  The left ventricular ejection fraction is 40-45% by visual estimate. Global hypokinesis  There is trace-mild (2+) mitral regurgitation.  Ost Cx to Prox Cx lesion is 30% stenosed. Otherwise minimal CAD with a large  wraparound LAD.   Angiographically minimal CAD with right dominant system.,  But large wraparound LAD. Moderate to severely elevated LVEDP.  Suspect troponin elevation was related to microvascular ischemia in the setting of acute (on chronic) combined systolic and diastolic heart failure. Unable to obtain venous access for right heart cath due to patient discomfort and respiratory distress, requiring nonrebreather oxygen I did not feel it was safe to keep her on the table to attempt further venous access after several attempts were made using ultrasound.  Plan:  Patient will be admitted to stepdown unit for IV diuresis.  I written for 40 mg IV Lasix twice daily.  Based on that positive troponin and unclear etiology, I have written to restart IV heparin 8 hours after TR band removal.  Will defer duration of treatment to primary cardiology team.  Defer further management to primary team  Consider evaluation for sleep apnea using CPAP based on patient's significant orthopnea.      Patient Profile     72 y.o. female with PMH of chronic diastolic CHF, HTN, COPD on home O2 3L, PE on eliquis, OSA on CPAP, morbid obesity who presents with complaints of SSCP for months, worsening over the past week, as well as R knee pain.   Assessment & Plan    1. Chest pain: patient complained of intermittent chest pressure for the past month, worse over the past week.  - Trop 1.51>1.53  - EKG without STE/D - s/p LHC 10/01/17 with minimal CAD; EF 40-45%, and EDP 25-31mmHg. Unable to perform RHC due to patient discomfort and respiratory distress requiring NRB.  - Will discontinue nitro ointment given minimal CAD on cath.  2. Acute on chronic hypoxic respiratory failure 2/2 acute on chronic diastolic CHF: patient with acute onset respiratory distress 10/01/17 during attempts to complete heart catheterization. Placed on a NRB for a short time and given po Lasix with some improvement. With some mild crackles at  lung bases and 1+ LE edema.  - Appreciate crit care recommendations - I&Os with net -461mL - Will give a dose of IV lasix this AM and monitor for improvement - Continue home carvedilol  3. COPD: does not appear to have an acute exacerbation - Continue home anoro ellipta - Wean O2 to home 3L as tolerated - goal O2 sat 90-92%  4. R knee pain: better with one time dose iv steroid yesterday Dr Ronnald Ramp to arrange ortho f/u   5. Chronic back pain: patient reports worsening back pain this AM. Likely due being off home medications - Will  restart home gabapentin, norco, and flexeril    6. CKD stage 3: Cr 1.20 prior to cath improved to .87 post good urine output   7. History of PE:  - resume home eliquis on d/c can give dose this am   For questions or updates, please contact [place] Please consult [email or web address] for contact info under Cardiology/STEMI.      Signed, Jenkins Rouge, MD  10/03/2017, 8:10 AM   [phone]  Patient examined chart reviewed. Primary complaint right knee pain Less dyspnea Cath with no CAD EF low normal. Primary issues are morbid obesity lung disease and knee arthritis Will give one time Dose solumedrol for knee effusion Will plan d/c in am with f/u Dr Ronnald Ramp who can arrange orthopedic  Evaluation and f/u with [facility]   Jenkins Rouge

## 2017-10-03 NOTE — Discharge Summary (Signed)
Discharge Summary    Patient ID: Janice Morrow,  MRN: [id number], DOB/AGE: 03/04/1946 72 y.o.  Admit date: 09/30/2017 Discharge date: 10/03/2017  Primary Care Provider: Janith Lima Primary Cardiologist: Kirk Ruths, MD  Discharge Diagnoses    Active Problems:   NSTEMI (non-ST elevated myocardial infarction) [facility])   Acute on chronic combined systolic and diastolic CHF (congestive heart failure) (HCC)   Allergies Allergies  Allergen Reactions  . Baclofen Other (See Comments)    Causes seizures  . Naproxen     Palpitations     Diagnostic Studies/Procedures    CARDIAC CATH: 10/01/2017  There is mild to moderate left ventricular systolic dysfunction.  LV end diastolic pressure is severely elevated. EDP 25-30 mmHg  The left ventricular ejection fraction is 40-45% by visual estimate. Global hypokinesis  There is trace-mild (2+) mitral regurgitation.  Ost Cx to Prox Cx lesion is 30% stenosed. Otherwise minimal CAD with a large wraparound LAD.  Angiographically minimal CAD with right dominant system.,  But large wraparound LAD. Moderate to severely elevated LVEDP.  Suspect troponin elevation was related to microvascular ischemia in the setting of acute (on chronic) combined systolic and diastolic heart failure. Unable to obtain venous access for right heart cath due to patient discomfort and respiratory distress, requiring nonrebreather oxygen I did not feel it was safe to keep her on the table to attempt further venous access after several attempts were made using ultrasound. Plan:  Patient will be admitted to stepdown unit for IV diuresis.  I written for 40 mg IV Lasix twice daily.  Based on that positive troponin and unclear etiology, I have written to restart IV heparin 8 hours after TR band removal.  Will defer duration of treatment to primary cardiology team.  Defer further management to primary team  Consider evaluation for sleep apnea using CPAP based on  patient's significant orthopnea.  _____________   History of Present Illness     72 y.o. female with PMH of chronic diastolic CHF, HTN, COPD on home O2 3L, PE on eliquis, OSA on CPAP, morbid obesity admitted 09/30/2017 with complaints of SSCP for months, worsening over the past week, as well as R knee pain.   Hospital Course     Consultants: Dr. Sabra Heck with Critical Care  1. Chest pain with elevated troponin:  - patient complained of intermittent chest pressure for the past month, worse over the past week.  - Trop 1.51>1.53, elevated, but without significant trend, felt 2nd respiratory issues  - EKG without ST elevation or depression - s/p LHC 10/01/17 with minimal CAD; EF 40-45%, and EDP 25-39mmHg. Unable to perform RHC due to patient discomfort and respiratory distress requiring NRB, patient felt to have significant orthopnea - Will discontinue nitro ointment given minimal CAD on cath.  - continue ASA, Coreg  2. Acute on chronic hypoxic respiratory failure 2/2 acute on chronic diastolic CHF:  - patient with acute onset respiratory distress 10/01/17 during attempts to complete heart catheterization. - Placed on a NRB for a short time and given po Lasix with some improvement. With some mild crackles at lung bases and 1+ LE edema>>then given 1 dose IV Lasix 40 mg.  - CCM saw and suggests outpt eval for COPD and possible pulm HTN as cause for dyspnea - I&Os with net -2 L>>respirations improved. - dc weight 340 lbs - Continue home carvedilol and po Lasix   3. COPD:  - respirations now at baseline - Continue home anoro ellipta -  Wean O2 to home 3L as tolerated - goal O2 sat 90-92%  4. R knee pain:  - better with one time dose IV steroid 02/06 - Dr Ronnald Ramp to see in f/u, ortho referral per him prn  5. Chronic back pain:  - patient reported worsening back pain after admit, likely due being off home medications - continue home gabapentin, norco, and flexeril    6. CKD stage 3:  - Cr  1.20 prior to cath improved to .87 post cath - good urine output   7. History of PE:  - resume home Eliquis on d/c, can give dose this am  - will make sure pt knows to take it bid, not both doses in am  _____________  Discharge Vitals Blood pressure 136/82, pulse 94, temperature 97.7 F (36.5 C), temperature source Oral, resp. rate 17, height 5\' 5"  (1.651 m), weight (!) 340 lb 6.2 oz (154.4 kg), last menstrual period 08/27/1993, SpO2 97 %.  Filed Weights   10/01/17 0000 10/03/17 0333  Weight: (!) 350 lb (158.8 kg) (!) 340 lb 6.2 oz (154.4 kg)    Labs & Radiologic Studies    CBC Recent Labs    10/02/17 0342 10/03/17 0709  WBC 9.7 9.4  HGB 13.0 13.1  HCT 43.3 44.7  MCV 91.5 92.9  PLT 178 295   Basic Metabolic Panel Recent Labs    09/30/17 2253 09/30/17 2312 10/02/17 1100  NA 138 140 138  K 4.3 4.3 4.6  CL 103 102 102  CO2 27  --  26  GLUCOSE 129* 129* 109*  BUN 34* 39* 21*  CREATININE 1.23* 1.20* 0.87  CALCIUM 9.2  --  9.0   Cardiac Enzymes Recent Labs    10/01/17 0120  TROPONINI 1.53*    Troponin i, poc  Date Value Ref Range Status  09/30/2017 1.51 (HH) 0.00 - 0.08 ng/mL Final   _____________  Dg Chest 2 View  Result Date: 09/30/2017 CLINICAL DATA:  Hypoxemia, 86% oxygen saturation on room air, COPD, chest pain, weakness, dizziness EXAM: CHEST  2 VIEW COMPARISON:  02/19/2017 FINDINGS: Normal heart size, mediastinal contours, and pulmonary vascularity. Minimal atelectasis at RIGHT. Lungs emphysematous but otherwise grossly clear. Atherosclerotic calcifications at aortic arch. No pleural effusion or pneumothorax. IMPRESSION: Emphysematous changes with minimal RIGHT basilar atelectasis. Electronically Signed   By: Lavonia Dana M.D.   On: 09/30/2017 18:19   Dg Chest Portable 1 View  Result Date: 10/02/2017 CLINICAL DATA:  Cough EXAM: PORTABLE CHEST 1 VIEW COMPARISON:  Chest x-ray of 09/30/2017 FINDINGS: Somewhat prominent hila are stable most consistent with a  degree of pulmonary arterial hypertension. No pneumonia or effusion is seen. Mild cardiomegaly is stable. No bony abnormality is seen. IMPRESSION: Stable chest x-ray with mild cardiomegaly. No definite active process. Electronically Signed   By: Ivar Drape M.D.   On: 10/02/2017 09:45   Dg Knee Complete 4 Views Right  Result Date: 09/30/2017 CLINICAL DATA:  Right knee pain EXAM: RIGHT KNEE - COMPLETE 4+ VIEW COMPARISON:  11/16/2016 FINDINGS: No fracture or malalignment. Marked arthritis involving the medial and patellofemoral compartments with narrow and degenerative spurring. Mild degenerative changes of the lateral compartment with bony spurring present. Trace suprapatellar effusion. IMPRESSION: 1. No acute osseous abnormality 2. Tricompartment arthritis most marked involving the medial and patellofemoral compartments. Trace effusion. Electronically Signed   By: Donavan Foil M.D.   On: 09/30/2017 18:15   Disposition   Pt is being discharged home today in good condition.  Follow-up  Plans & Appointments    Follow-up Information    Janith Lima, MD Follow up on 10/08/2017.   Specialty:  Internal Medicine Why:  Please arrive 15 minutes early for your 3:20pm appointment Contact information: [place]. [place] [address] [phone]        Lendon Colonel, NP Follow up on 10/16/2017.   Specialties:  Nurse Practitioner, Radiology, Cardiology Why:  Please arrive 15 minutes early for your 10:30am appointment Contact information: [address] [place] [address] [phone]          Discharge Instructions    ([place]) Call MD:  Anytime you have any of the following symptoms: 1) 3 pound weight gain in 24 hours or 5 pounds in 1 week 2) shortness of breath, with or without a dry hacking cough 3) swelling in the hands, feet or stomach 4) if you have to sleep on extra pillows at night in order to breathe.   Complete by:  As directed     Diet - low sodium heart healthy   Complete by:  As directed    Increase activity slowly   Complete by:  As directed       Discharge Medications   Allergies as of 10/03/2017      Reactions   Baclofen Other (See Comments)   Causes seizures   Naproxen    Palpitations       Medication List    TAKE these medications   apixaban 5 MG Tabs tablet Commonly known as:  ELIQUIS Take 1 tablet (5 mg total) by mouth 2 (two) times daily. What changed:    how much to take  when to take this   carvedilol 3.125 MG tablet Commonly known as:  COREG TAKE 1 TABLET BY MOUTH 2 TIMES DAILY WITH A MEAL   furosemide 40 MG tablet Commonly known as:  LASIX Take 1 tablet (40 mg total) by mouth daily.   gabapentin 400 MG capsule Commonly known as:  NEURONTIN Take 400-1,200 mg by mouth See admin instructions. TAKE 1 CAPSULE AT LUNCH, TAKE 2 CAPSULES AT SUPPER AND 3 CAPSULES AT BEDTIME   HYDROcodone-acetaminophen 10-325 MG tablet Commonly known as:  NORCO Take 1 tablet by mouth every 6 (six) hours as needed. What changed:  reasons to take this   traMADol 50 MG tablet Commonly known as:  ULTRAM Take 1 tablet (50 mg total) by mouth every 6 (six) hours as needed.   umeclidinium-vilanterol 62.5-25 MCG/INH Aepb Commonly known as:  ANORO ELLIPTA Inhale 1 puff into the lungs daily.         Outstanding Labs/Studies   None  Duration of Discharge Encounter   Greater than 30 minutes including physician time.  Alcario Drought Marcee Jacobs NP 10/03/2017, 10:08 AM

## 2017-10-03 NOTE — Progress Notes (Addendum)
[place] for IV heparin to Eliquis Indication: pulmonary embolus/ACS  Allergies  Allergen Reactions  . Baclofen Other (See Comments)    Causes seizures  . Naproxen     Palpitations     Patient Measurements:  Height: 5\' 5"  (165.1 cm) Weight: (!) 340 lb 6.2 oz (154.4 kg) IBW/kg (Calculated) : 57wt=158 kg Heparin Dosing Weight: 87 kg  Vital Signs: Temp: 97.7 F (36.5 C) (02/07 0500) Temp Source: Oral (02/07 0500) BP: 102/69 (02/07 0500) Pulse Rate: 81 (02/07 0014)  Labs: Recent Labs    09/30/17 2253 09/30/17 2312  09/30/17 2353 10/01/17 0120 10/02/17 0342 10/02/17 0930 10/02/17 0945 10/02/17 1100 10/02/17 1950 10/03/17 0709  HGB 14.5 16.3*  --   --   --  13.0  --   --   --   --  13.1  HCT 45.1 48.0*  --   --   --  43.3  --   --   --   --  44.7  PLT 160  --   --   --   --  178  --   --   --   --  184  APTT  --   --    < > <20*  --   --   --  23*  --  30 46*  LABPROT  --   --   --  14.9  --   --   --   --   --   --   --   INR  --   --   --  1.18  --   --   --   --   --   --   --   HEPARINUNFRC  --   --    < > 1.76*  --   --  0.31  --   --  0.24* 0.41  CREATININE 1.23* 1.20*  --   --   --   --   --   --  0.87  --   --   TROPONINI  --   --   --   --  1.53*  --   --   --   --   --   --    < > = values in this interval not displayed.    Estimated Creatinine Clearance: 92.5 mL/min (by C-G formula based on SCr of 0.87 mg/dL).  Assessment: 78 yoF on apixaban PTA for hx of PE, now s.p cath, wasn't able to have any intervention d/t profound dyspnea. Heparin was restarted 8 hours post TR band removal.  HL this AM is therapeutic at 0.41. H/H and Plt wnl. Scr 0.87  Goal of Therapy:  Heparin level 0.3-0.7 units/ml Monitor platelets by anticoagulation protocol: Yes    Plan:  Stop IV heparin  Monitor for s/sx bleeding Start apixaban 5 mg twice daily   Albertina Parr, PharmD., BCPS Clinical Pharmacist Pager [phone]

## 2017-10-03 NOTE — Care Management Note (Addendum)
Case Management Note Marvetta Gibbons RN, BSN Unit 4E-Case Manager [phone]  Patient Details  Name: Janice Morrow MRN: [id number] Date of Birth: Sep 19, 1945  Subjective/Objective:   Pt admitted with NSTEMI s/p cath plan for medical management                 Action/Plan:  PTA pt lived at home- has cpap at home- no CM needs noted for transition back home- per bedside RN bedbugs where noted at [facility] prior to tx to [facility] for cath- no bedbugs noted since pt has been here at [facility].   Expected Discharge Date:  10/03/17               Expected Discharge Plan:  Home/Self Care  In-House Referral:  NA  Discharge planning Services  CM Consult  Post Acute Care Choice:   DME Choice offered to:   patient  DME Arranged:   [facility] DME Agency:   [facility]  HH Arranged:    [place] Agency:     Status of Service:  Completed, signed off  If discussed at [place] of Stay Meetings, dates discussed:   Discharge Disposition: home/self care    Additional Comments:  10/03/17- 1200- Marvetta Gibbons RN, CM- per bedside RN- pt requesting [facility] for home- order received- notified Butch Penny with [facility] for DME needs- BSC to be delivered to room prior to discharge.   Dawayne Patricia, RN 10/03/2017, 11:24 AM

## 2017-10-03 NOTE — Progress Notes (Signed)
Orders for discharge received.  I told patient that I have paged ID for protocol to discharge pt after bed bugs have been witnessed.  She became irate, yelling and adamantly refusing to wear paper scrubs out of the hospital.  She will only wear the clothes that she wore in, which are secured in a plastic bag in the room at this time.  She states that she does not have bed bugs at home.  I have not witnessed any bugs at this time. Apparently this was seen at [facility] prior to transfer.  Per 4E management, if pt is insistent on wearing her clothes out at discharge,  she is to remain in gown until family is present and ready to take her home before changing.

## 2017-10-04 ENCOUNTER — Telehealth: Payer: Self-pay | Admitting: *Deleted

## 2017-10-04 NOTE — Telephone Encounter (Signed)
Called pt to verify appt that was made by the hosp on 10/08/17 w/Dr. Jenny Reichmann. Spoke w/brother Janice Morrow) pt was there with him. Pt does not want to see Jenny Reichmann should have been schedule w/Dr. Ronnald Ramp since he is her PCP. Inform pt/brother will reschedule w/correct MD also have some additional questions concerning d/c. Completed TCM call below.Janice Morrow  Transition Care Management Follow-up Telephone Call   Date discharged? 10/03/17   How have you been since you were released from the hospital? Per brother pt states she is feeling ok just don't have an appetite. Janice Morrow states she hasn't ate much since coming home yesterday. Inform pt/brother to try to eat soup, crackers, rice, ect, and plenty of fluids so that she can rebuild her strength.     Do you understand why you were in the hospital? YES   Do you understand the discharge instructions? YES   Where were you discharged to? Home   Items Reviewed:  Medications reviewed: YES  Allergies reviewed: YES  Dietary changes reviewed: YES, pt doesn't;t have appetite   Referrals reviewed: No referral needed   Functional Questionnaire:   Activities of Daily Living (ADLs):   She states she are independent in the following: bathing and hygiene, feeding, continence, grooming, toileting and dressing States she require assistance with the following: ambulation   Any transportation issues/concerns?: YES, per brother will have scat to bring pt   Any patient concerns? NO   Confirmed importance and date/time of follow-up visits scheduled YES, had to move appt out a week per brother. Need to arrange transportation. Made appt 10/14/17   Provider Appointment booked with Dr. Ronnald Ramp  Confirmed with patient if condition begins to worsen call PCP or go to the ER.  Patient was given the office number and encouraged to call back with question or concerns.  : YES

## 2017-10-08 ENCOUNTER — Inpatient Hospital Stay: Payer: Medicare Other | Admitting: Internal Medicine

## 2017-10-14 ENCOUNTER — Encounter: Payer: Self-pay | Admitting: Internal Medicine

## 2017-10-14 ENCOUNTER — Ambulatory Visit (INDEPENDENT_AMBULATORY_CARE_PROVIDER_SITE_OTHER): Payer: Medicare Other | Admitting: Internal Medicine

## 2017-10-14 VITALS — BP 120/70 | HR 90 | Temp 98.3°F | Resp 20

## 2017-10-14 DIAGNOSIS — J418 Mixed simple and mucopurulent chronic bronchitis: Secondary | ICD-10-CM

## 2017-10-14 DIAGNOSIS — G8929 Other chronic pain: Secondary | ICD-10-CM | POA: Diagnosis not present

## 2017-10-14 DIAGNOSIS — I519 Heart disease, unspecified: Secondary | ICD-10-CM | POA: Diagnosis not present

## 2017-10-14 DIAGNOSIS — G4733 Obstructive sleep apnea (adult) (pediatric): Secondary | ICD-10-CM | POA: Diagnosis not present

## 2017-10-14 DIAGNOSIS — Z9989 Dependence on other enabling machines and devices: Secondary | ICD-10-CM

## 2017-10-14 DIAGNOSIS — I5189 Other ill-defined heart diseases: Secondary | ICD-10-CM

## 2017-10-14 DIAGNOSIS — M5440 Lumbago with sciatica, unspecified side: Secondary | ICD-10-CM | POA: Diagnosis not present

## 2017-10-14 DIAGNOSIS — M170 Bilateral primary osteoarthritis of knee: Secondary | ICD-10-CM

## 2017-10-14 MED ORDER — TIZANIDINE HCL 2 MG PO TABS: 2.0000 mg | ORAL_TABLET | Freq: Three times a day (TID) | ORAL | 3 refills | Status: DC | PRN

## 2017-10-14 NOTE — Progress Notes (Deleted)
Cardiology Office Note   Date:  10/14/2017   ID:  Janice Morrow, DOB [birth year], MRN [id number]  PCP:  Janith Lima, MD  Cardiologist:  Dr. Stanford Breed No chief complaint on file.    History of Present Illness: Janice Morrow is a 72 y.o. 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 Patient ultimately had cardiac catheterization on 10/01/2017, revealing nonobstructive CAD with moderately elevated LVEDP.  CARDIAC CATH: 10/01/2017  There is mild to moderate left ventricular systolic dysfunction.  LV end diastolic pressure is severely elevated. EDP 25-30 mmHg  The left ventricular ejection fraction is 40-45% by visual estimate. Global hypokinesis  There is trace-mild (2+) mitral regurgitation.  Ost Cx to Prox Cx lesion is 30% stenosed. Otherwise minimal CAD with a large wraparound LAD. Angiographically minimal CAD with right dominant system., But large wraparound LAD. Moderate to severely elevated LVEDP. Suspect troponin elevation was related to microvascular ischemia in the setting of acute (on chronic) combined systolic and diastolic heart failure. Unable to obtain venous access for right heart cath due to patient discomfort and respiratory distress, requiring nonrebreather oxygen I did not feel it was safe to keep her on the table to attempt further venous access after several attempts were made using ultrasound. Plan:  Patient will be admitted to stepdown unit for IV diuresis. I written for 40 mg IV Lasix twice daily.  Based on that positive troponin and unclear etiology, I have written to restart IV heparin 8 hours after TR band removal. Will defer duration of treatment to primary cardiology team.  Defer further management to primary team  Consider evaluation for sleep apnea using CPAP based on patient's significant orthopnea.   The patient was continued on IV  diuresis. Echocardiogram  40% to 45%. EDP 25-30 mmHg. He was unable to have right heart cath due to patient discomfort and respiratory distress requiring nonrebreather mask. He was diagnosed with pulmonary hypertension on consultation pulmonary. Diagnosis COPD. After 2 L diuresis, symptoms improved and his weight decreased to 340 pounds. He was sent home on carvedilol. Lasix. He was also sent home O2 3 L via nasal cannula to be weaned down to keep oxygen saturation 90% to 92%. Creatinine  after diuresis 0.87. History of PE, the patient was continued on ELIQUIS  Past Medical History:  Diagnosis Date  . Acute diastolic CHF (congestive heart failure) ([place]) 08/24/2015  . Cervicalgia   . Chronic back pain   . COPD (chronic obstructive pulmonary disease) ([place])   . Essential hypertension 08/24/2015  . GERD (gastroesophageal reflux disease)   . Headache(784.0)   . Heart murmur   . History of kidney stones   . Hyperlipidemia   . Migraine without aura, with intractable migraine, so stated, without mention of status migrainosus   . OSA on CPAP    patient denies sleep apnea uses CPAP to get oxygent o brain  . Osteoarthritis   . Pulmonary embolism ([place])   . PVD (peripheral vascular disease) ([place])   . PVD (peripheral vascular disease) ([place])   . Seizures ([place]) 07/2014, 10/2014   due to baclofen  . Shortness of breath dyspnea   . Trigeminal neuralgia     Past Surgical History:  Procedure Laterality Date  . LEFT HEART CATH AND CORONARY ANGIOGRAPHY N/A 10/01/2017   Procedure: LEFT HEART CATH AND CORONARY ANGIOGRAPHY;  Surgeon: Leonie Man, MD;  Location: [facility];  Service: Cardiovascular;  Laterality: N/A;  . MULTIPLE TOOTH EXTRACTIONS     "they took out part of my teeth; I took out the rest when they got loose; was suppose to get dentures; never did"  . NO PAST SURGERIES    . URETEROSCOPY WITH HOLMIUM LASER LITHOTRIPSY Left 07/09/2017   Procedure: URETEROSCOPY WITH HOLMIUM LASER  LITHOTRIPSY/ STENT;  Surgeon: Festus Aloe, MD;  Location: [facility];  Service: Urology;  Laterality: Left;  . URETEROSCOPY WITH HOLMIUM LASER LITHOTRIPSY Left 07/23/2017   Procedure: URETEROSCOPY WITH HOLMIUM LASER LITHOTRIPSY /STENT;  Surgeon: Festus Aloe, MD;  Location: [facility];  Service: Urology;  Laterality: Left;  NEEDS 60 MINUTES FOR PROCEDURE     Current Outpatient Medications  Medication Sig Dispense Refill  . apixaban (ELIQUIS) 5 MG TABS tablet Take 1 tablet (5 mg total) by mouth 2 (two) times daily. (Patient taking differently: Take 10 mg by mouth daily. ) 60 tablet 5  . carvedilol (COREG) 3.125 MG tablet TAKE 1 TABLET BY MOUTH 2 TIMES DAILY WITH A MEAL 180 tablet 1  . furosemide (LASIX) 40 MG tablet Take 1 tablet (40 mg total) by mouth daily. 90 tablet 1  . gabapentin (NEURONTIN) 400 MG capsule Take 400-1,200 mg by mouth See admin instructions. TAKE 1 CAPSULE AT LUNCH, TAKE 2 CAPSULES AT SUPPER AND 3 CAPSULES AT BEDTIME     . HYDROcodone-acetaminophen (NORCO) 10-325 MG tablet Take 1 tablet by mouth every 6 (six) hours as needed. (Patient taking differently: Take 1 tablet by mouth every 6 (six) hours as needed for moderate pain. ) 90 tablet 0  . traMADol (ULTRAM) 50 MG tablet Take 1 tablet (50 mg total) by mouth every 6 (six) hours as needed. (Patient not taking: Reported on 09/30/2017) 20 tablet 0  . umeclidinium-vilanterol (ANORO ELLIPTA) 62.5-25 MCG/INH AEPB Inhale 1 puff into the lungs daily. 60 each 7   No current facility-administered medications for this visit.     Allergies:   Baclofen and Naproxen    Social History:  The patient  reports that she quit smoking about 7 years ago. Her smoking use included cigarettes. she has never used smokeless tobacco. She reports that she does not drink alcohol or use drugs.   Family History:  The patient's family history includes Dementia in her mother; Diabetes in her brother, mother, and unknown relative; Heart attack in her  father; Heart disease in her unknown relative.    ROS: All other systems are reviewed and negative. Unless otherwise mentioned in H&P    PHYSICAL EXAM: VS:  LMP 08/27/1993 (Approximate)  , BMI There is no height or weight on file to calculate BMI. GEN: Well nourished, well developed, in no acute distress  HEENT: normal  Neck: no JVD, carotid bruits, or masses Cardiac: ***RRR; no murmurs, rubs, or gallops,no edema  Respiratory:  clear to auscultation bilaterally, normal work of breathing GI: soft, nontender, nondistended, + BS MS: no deformity or atrophy  Skin: warm and dry, no rash Neuro:  Strength and sensation are intact Psych: euthymic mood, full affect   EKG:  EKG {ACTION; IS/IS PYP:[id number]} ordered today. The ekg ordered today demonstrates ***   Recent Labs: 12/03/2016: Magnesium 2.3 01/16/2017: B Natriuretic Peptide 47.3 01/30/2017: ALT 16; TSH 2.65 10/02/2017: BUN 21; Creatinine, Ser 0.87; Potassium 4.6; Sodium 138 10/03/2017: Hemoglobin 13.1; Platelets 184    Lipid Panel    Component Value Date/Time   CHOL 179 01/30/2017 1629   TRIG 128.0 01/30/2017 1629   HDL 59.90 01/30/2017 1629  CHOLHDL 3 01/30/2017 1629   VLDL 25.6 01/30/2017 1629   LDLCALC 93 01/30/2017 1629      Wt Readings from Last 3 Encounters:  10/03/17 (!) 340 lb 6.2 oz (154.4 kg)  07/23/17 (!) 350 lb (158.8 kg)  07/22/17 (!) 350 lb (158.8 kg)      Other studies Reviewed: Additional studies/ records that were reviewed today include: ***. Review of the above records demonstrates: ***   ASSESSMENT AND PLAN:  1.  ***   Current medicines are reviewed at length with the patient today.    Labs/ tests ordered today include: *** Phill Myron. [place], ANP, AACC   10/14/2017 7:53 AM    [facility] [address] [place], [place] [address] Phone: [phone]; Fax: [phone]

## 2017-10-14 NOTE — Patient Instructions (Signed)

## 2017-10-14 NOTE — Progress Notes (Signed)
Subjective:  Patient ID: Janice Morrow, female    DOB: 06-29-1946  Age: 72 y.o. MRN: [id number]  CC: Cough   HPI LARETTA PYATT presents for f/up - She was recently admitted for a CHF exacerbation and was diuresed with Lasix.  She returns for follow-up and tells me she has improved significantly.  She has a mild, intermittent cough productive of white phlegm but gets good symptom relief with the LABA/LAMA inhaler.  She tells me the cough worsens at night and she complains of shortness of breath.  She denies chest pain, edema, or fatigue.  Outpatient Medications Prior to Visit  Medication Sig Dispense Refill  . apixaban (ELIQUIS) 5 MG TABS tablet Take 1 tablet (5 mg total) by mouth 2 (two) times daily. (Patient taking differently: Take 10 mg by mouth daily. ) 60 tablet 5  . carvedilol (COREG) 3.125 MG tablet TAKE 1 TABLET BY MOUTH 2 TIMES DAILY WITH A MEAL 180 tablet 1  . furosemide (LASIX) 40 MG tablet Take 1 tablet (40 mg total) by mouth daily. 90 tablet 1  . gabapentin (NEURONTIN) 400 MG capsule Take 400-1,200 mg by mouth See admin instructions. TAKE 1 CAPSULE AT LUNCH, TAKE 2 CAPSULES AT SUPPER AND 3 CAPSULES AT BEDTIME     . HYDROcodone-acetaminophen (NORCO) 10-325 MG tablet Take 1 tablet by mouth every 6 (six) hours as needed. (Patient taking differently: Take 1 tablet by mouth every 6 (six) hours as needed for moderate pain. ) 90 tablet 0  . traMADol (ULTRAM) 50 MG tablet Take 1 tablet (50 mg total) by mouth every 6 (six) hours as needed. 20 tablet 0  . umeclidinium-vilanterol (ANORO ELLIPTA) 62.5-25 MCG/INH AEPB Inhale 1 puff into the lungs daily. 60 each 7   No facility-administered medications prior to visit.     ROS Review of Systems  Constitutional: Negative.  Negative for appetite change, diaphoresis, fatigue and unexpected weight change.  HENT: Negative.   Eyes: Negative.  Negative for visual disturbance.  Respiratory: Positive for cough and shortness of breath. Negative  for choking, chest tightness and wheezing.   Cardiovascular: Negative for chest pain, palpitations and leg swelling.  Gastrointestinal: Negative.  Negative for abdominal pain, constipation, diarrhea, nausea and vomiting.  Endocrine: Negative.   Genitourinary: Negative.  Negative for difficulty urinating.  Musculoskeletal: Positive for arthralgias and back pain. Negative for myalgias and neck pain.  Skin: Negative.  Negative for color change and rash.  Allergic/Immunologic: Negative.   Neurological: Negative.  Negative for dizziness, weakness and light-headedness.  Hematological: Negative for adenopathy. Does not bruise/bleed easily.  Psychiatric/Behavioral: Negative.     Objective:  BP 120/70   Pulse 90   Temp 98.3 F (36.8 C) (Oral)   Resp 20   LMP 08/27/1993 (Approximate)   SpO2 (!) 61%   BP Readings from Last 3 Encounters:  10/14/17 120/70  10/03/17 136/82  07/23/17 (!) 166/88    Wt Readings from Last 3 Encounters:  10/03/17 (!) 340 lb 6.2 oz (154.4 kg)  07/23/17 (!) 350 lb (158.8 kg)  07/22/17 (!) 350 lb (158.8 kg)    Physical Exam  Constitutional: She is oriented to person, place, and time.  Non-toxic appearance. She has a sickly appearance. She appears ill. No distress.  She is tachypneic with a pulse ox of 61% on room air.   Pulse ox 94% with 4L via [place]  HENT:  Mouth/Throat: Oropharynx is clear and moist. No oropharyngeal exudate.  Eyes: Conjunctivae are normal. Left eye exhibits no  discharge. No scleral icterus.  Neck: Normal range of motion. Neck supple. No JVD present. No thyromegaly present.  Cardiovascular: Normal rate, regular rhythm and normal heart sounds. Exam reveals no gallop.  No murmur heard. Pulmonary/Chest: Effort normal and breath sounds normal. No respiratory distress. She has no wheezes. She has no rales.  Abdominal: Soft. Bowel sounds are normal. She exhibits no distension. There is no tenderness.  Musculoskeletal: Normal range of motion. She  exhibits no edema or tenderness.       Right knee: She exhibits deformity (DJD). She exhibits normal range of motion, no swelling and no effusion. No tenderness found.       Left knee: She exhibits deformity (DJD). She exhibits normal range of motion, no swelling, no effusion and no erythema. No tenderness found.  Lymphadenopathy:    She has no cervical adenopathy.  Neurological: She is alert and oriented to person, place, and time.  Skin: Skin is warm and dry. No rash noted. She is not diaphoretic. No erythema. No pallor.  Vitals reviewed.   Lab Results  Component Value Date   WBC 9.4 10/03/2017   HGB 13.1 10/03/2017   HCT 44.7 10/03/2017   PLT 184 10/03/2017   GLUCOSE 109 (H) 10/02/2017   CHOL 179 01/30/2017   TRIG 128.0 01/30/2017   HDL 59.90 01/30/2017   LDLCALC 93 01/30/2017   ALT 16 01/30/2017   AST 15 01/30/2017   NA 138 10/02/2017   K 4.6 10/02/2017   CL 102 10/02/2017   CREATININE 0.87 10/02/2017   BUN 21 (H) 10/02/2017   CO2 26 10/02/2017   TSH 2.65 01/30/2017   INR 1.18 09/30/2017   HGBA1C 6.1 05/20/2017   MICROALBUR 9.8 (H) 05/20/2017    Dg Chest 2 View  Result Date: 09/30/2017 CLINICAL DATA:  Hypoxemia, 86% oxygen saturation on room air, COPD, chest pain, weakness, dizziness EXAM: CHEST  2 VIEW COMPARISON:  02/19/2017 FINDINGS: Normal heart size, mediastinal contours, and pulmonary vascularity. Minimal atelectasis at RIGHT. Lungs emphysematous but otherwise grossly clear. Atherosclerotic calcifications at aortic arch. No pleural effusion or pneumothorax. IMPRESSION: Emphysematous changes with minimal RIGHT basilar atelectasis. Electronically Signed   By: Lavonia Dana M.D.   On: 09/30/2017 18:19   Dg Knee Complete 4 Views Right  Result Date: 09/30/2017 CLINICAL DATA:  Right knee pain EXAM: RIGHT KNEE - COMPLETE 4+ VIEW COMPARISON:  11/16/2016 FINDINGS: No fracture or malalignment. Marked arthritis involving the medial and patellofemoral compartments with narrow and  degenerative spurring. Mild degenerative changes of the lateral compartment with bony spurring present. Trace suprapatellar effusion. IMPRESSION: 1. No acute osseous abnormality 2. Tricompartment arthritis most marked involving the medial and patellofemoral compartments. Trace effusion. Electronically Signed   By: Donavan Foil M.D.   On: 09/30/2017 18:15    Assessment & Plan:   Severa was seen today for cough.  Diagnoses and all orders for this visit:  Mixed simple and mucopurulent chronic bronchitis ([place])- She needs to be on O2 at 4 L per nasal cannula.  I have sent a referral to [facility] to have this administered. -     Ambulatory referral to [place]  OSA on CPAP- as above -     Ambulatory referral to [place]  Primary osteoarthritis of both knees -     Ambulatory referral to Orthopedic Surgery -     tiZANidine (ZANAFLEX) 2 MG tablet; Take 1 tablet (2 mg total) by mouth every 8 (eight) hours as needed. For migraine  Chronic low back  pain with sciatica, sciatica laterality unspecified, unspecified back pain laterality -     tiZANidine (ZANAFLEX) 2 MG tablet; Take 1 tablet (2 mg total) by mouth every 8 (eight) hours as needed. For migraine  Left ventricular diastolic dysfunction, NYHA class 1- Her volume status is normal now.  Will continue Lasix and carvedilol.   I have changed Arnaldo Natal. Pulsifer's tiZANidine. I am also having her maintain her HYDROcodone-acetaminophen, gabapentin, apixaban, umeclidinium-vilanterol, furosemide, traMADol, and carvedilol.  Meds ordered this encounter  Medications  . tiZANidine (ZANAFLEX) 2 MG tablet    Sig: Take 1 tablet (2 mg total) by mouth every 8 (eight) hours as needed. For migraine    Dispense:  90 tablet    Refill:  3     Follow-up: Return if symptoms worsen or fail to improve.  [name], MD

## 2017-10-16 ENCOUNTER — Ambulatory Visit: Payer: Medicare Other | Admitting: Adult Health

## 2017-10-24 ENCOUNTER — Emergency Department (HOSPITAL_COMMUNITY): Payer: Medicare Other

## 2017-10-24 ENCOUNTER — Inpatient Hospital Stay (HOSPITAL_COMMUNITY)
Admission: EM | Admit: 2017-10-24 | Discharge: 2017-10-29 | DRG: 190 | Disposition: A | Discharge status: Home / Self Care | Payer: Medicare Other | Attending: Internal Medicine | Admitting: Internal Medicine

## 2017-10-24 ENCOUNTER — Encounter (HOSPITAL_COMMUNITY): Payer: Self-pay | Admitting: Emergency Medicine

## 2017-10-24 ENCOUNTER — Other Ambulatory Visit: Payer: Self-pay

## 2017-10-24 ENCOUNTER — Inpatient Hospital Stay (HOSPITAL_COMMUNITY): Payer: Medicare Other

## 2017-10-24 DIAGNOSIS — M79642 Pain in left hand: Secondary | ICD-10-CM | POA: Diagnosis not present

## 2017-10-24 DIAGNOSIS — R51 Headache: Secondary | ICD-10-CM | POA: Diagnosis not present

## 2017-10-24 DIAGNOSIS — G8929 Other chronic pain: Secondary | ICD-10-CM | POA: Diagnosis not present

## 2017-10-24 DIAGNOSIS — E785 Hyperlipidemia, unspecified: Secondary | ICD-10-CM | POA: Diagnosis present

## 2017-10-24 DIAGNOSIS — I252 Old myocardial infarction: Secondary | ICD-10-CM

## 2017-10-24 DIAGNOSIS — Z7901 Long term (current) use of anticoagulants: Secondary | ICD-10-CM | POA: Diagnosis not present

## 2017-10-24 DIAGNOSIS — G4733 Obstructive sleep apnea (adult) (pediatric): Secondary | ICD-10-CM

## 2017-10-24 DIAGNOSIS — M549 Dorsalgia, unspecified: Secondary | ICD-10-CM | POA: Diagnosis present

## 2017-10-24 DIAGNOSIS — Z888 Allergy status to other drugs, medicaments and biological substances status: Secondary | ICD-10-CM

## 2017-10-24 DIAGNOSIS — R52 Pain, unspecified: Secondary | ICD-10-CM

## 2017-10-24 DIAGNOSIS — Z9981 Dependence on supplemental oxygen: Secondary | ICD-10-CM | POA: Diagnosis not present

## 2017-10-24 DIAGNOSIS — Z9119 Patient's noncompliance with other medical treatment and regimen: Secondary | ICD-10-CM

## 2017-10-24 DIAGNOSIS — Z79899 Other long term (current) drug therapy: Secondary | ICD-10-CM | POA: Diagnosis not present

## 2017-10-24 DIAGNOSIS — J441 Chronic obstructive pulmonary disease with (acute) exacerbation: Secondary | ICD-10-CM | POA: Diagnosis not present

## 2017-10-24 DIAGNOSIS — Z886 Allergy status to analgesic agent status: Secondary | ICD-10-CM | POA: Diagnosis not present

## 2017-10-24 DIAGNOSIS — Z87891 Personal history of nicotine dependence: Secondary | ICD-10-CM

## 2017-10-24 DIAGNOSIS — E662 Morbid (severe) obesity with alveolar hypoventilation: Secondary | ICD-10-CM

## 2017-10-24 DIAGNOSIS — Z6841 Body Mass Index (BMI) 40.0 and over, adult: Secondary | ICD-10-CM

## 2017-10-24 DIAGNOSIS — I5043 Acute on chronic combined systolic (congestive) and diastolic (congestive) heart failure: Secondary | ICD-10-CM

## 2017-10-24 DIAGNOSIS — M7989 Other specified soft tissue disorders: Secondary | ICD-10-CM | POA: Diagnosis not present

## 2017-10-24 DIAGNOSIS — J9621 Acute and chronic respiratory failure with hypoxia: Secondary | ICD-10-CM | POA: Diagnosis not present

## 2017-10-24 DIAGNOSIS — I10 Essential (primary) hypertension: Secondary | ICD-10-CM | POA: Diagnosis present

## 2017-10-24 DIAGNOSIS — R0902 Hypoxemia: Secondary | ICD-10-CM | POA: Diagnosis not present

## 2017-10-24 DIAGNOSIS — Z9114 Patient's other noncompliance with medication regimen: Secondary | ICD-10-CM | POA: Diagnosis not present

## 2017-10-24 DIAGNOSIS — Z9989 Dependence on other enabling machines and devices: Secondary | ICD-10-CM | POA: Diagnosis not present

## 2017-10-24 DIAGNOSIS — M1990 Unspecified osteoarthritis, unspecified site: Secondary | ICD-10-CM | POA: Diagnosis present

## 2017-10-24 DIAGNOSIS — J449 Chronic obstructive pulmonary disease, unspecified: Secondary | ICD-10-CM

## 2017-10-24 DIAGNOSIS — M5440 Lumbago with sciatica, unspecified side: Secondary | ICD-10-CM | POA: Diagnosis not present

## 2017-10-24 DIAGNOSIS — I509 Heart failure, unspecified: Secondary | ICD-10-CM | POA: Diagnosis not present

## 2017-10-24 DIAGNOSIS — I110 Hypertensive heart disease with heart failure: Secondary | ICD-10-CM | POA: Diagnosis present

## 2017-10-24 DIAGNOSIS — Z9861 Coronary angioplasty status: Secondary | ICD-10-CM

## 2017-10-24 DIAGNOSIS — G43909 Migraine, unspecified, not intractable, without status migrainosus: Secondary | ICD-10-CM | POA: Diagnosis present

## 2017-10-24 DIAGNOSIS — K219 Gastro-esophageal reflux disease without esophagitis: Secondary | ICD-10-CM | POA: Diagnosis not present

## 2017-10-24 DIAGNOSIS — Z7951 Long term (current) use of inhaled steroids: Secondary | ICD-10-CM

## 2017-10-24 DIAGNOSIS — Z86711 Personal history of pulmonary embolism: Secondary | ICD-10-CM | POA: Diagnosis not present

## 2017-10-24 DIAGNOSIS — R0602 Shortness of breath: Secondary | ICD-10-CM | POA: Diagnosis not present

## 2017-10-24 DIAGNOSIS — I5030 Unspecified diastolic (congestive) heart failure: Secondary | ICD-10-CM | POA: Diagnosis not present

## 2017-10-24 LAB — CBC
HEMATOCRIT: 44.1 % (ref 36.0–46.0)
HEMOGLOBIN: 12.9 g/dL (ref 12.0–15.0)
MCH: 27.3 pg (ref 26.0–34.0)
MCHC: 29.3 g/dL — AB (ref 30.0–36.0)
MCV: 93.4 fL (ref 78.0–100.0)
Platelets: 178 10*3/uL (ref 150–400)
RBC: 4.72 MIL/uL (ref 3.87–5.11)
RDW: 16.2 % — ABNORMAL HIGH (ref 11.5–15.5)
WBC: 8.5 10*3/uL (ref 4.0–10.5)

## 2017-10-24 LAB — BASIC METABOLIC PANEL
ANION GAP: 10 (ref 5–15)
BUN: 11 mg/dL (ref 6–20)
CALCIUM: 8.8 mg/dL — AB (ref 8.9–10.3)
CO2: 30 mmol/L (ref 22–32)
Chloride: 101 mmol/L (ref 101–111)
Creatinine, Ser: 0.95 mg/dL (ref 0.44–1.00)
GFR calc Af Amer: >60 mL/min (ref >60)
GFR calc non Af Amer: 60 mL/min — ABNORMAL LOW (ref >60)
GLUCOSE: 128 mg/dL — AB (ref 65–99)
Potassium: 3.9 mmol/L (ref 3.5–5.1)
Sodium: 141 mmol/L (ref 135–145)

## 2017-10-24 LAB — I-STAT TROPONIN, ED: TROPONIN I, POC: 0.02 ng/mL (ref 0.00–0.08)

## 2017-10-24 LAB — BRAIN NATRIURETIC PEPTIDE: B Natriuretic Peptide: 183.1 pg/mL — ABNORMAL HIGH (ref 0.0–100.0)

## 2017-10-24 MED ORDER — GABAPENTIN 400 MG PO CAPS
800.0000 mg | ORAL_CAPSULE | Freq: Every day | ORAL | Status: DC
  Administered 2017-10-25 – 2017-10-29 (×5): 800 mg via ORAL
  Filled 2017-10-24 (×5): qty 2

## 2017-10-24 MED ORDER — FUROSEMIDE 10 MG/ML IJ SOLN
40.0000 mg | Freq: Two times a day (BID) | INTRAMUSCULAR | Status: DC
  Administered 2017-10-25 – 2017-10-29 (×9): 40 mg via INTRAVENOUS
  Filled 2017-10-24 (×11): qty 4

## 2017-10-24 MED ORDER — GABAPENTIN 400 MG PO CAPS: 400.0000 mg | ORAL_CAPSULE | ORAL | Status: DC

## 2017-10-24 MED ORDER — HYDROCODONE-ACETAMINOPHEN 10-325 MG PO TABS
1.0000 | ORAL_TABLET | Freq: Four times a day (QID) | ORAL | Status: DC | PRN
  Administered 2017-10-26 – 2017-10-29 (×10): 1 via ORAL
  Filled 2017-10-24 (×10): qty 1

## 2017-10-24 MED ORDER — ACETAMINOPHEN 325 MG PO TABS
650.0000 mg | ORAL_TABLET | ORAL | Status: DC | PRN
  Filled 2017-10-24: qty 2

## 2017-10-24 MED ORDER — IPRATROPIUM-ALBUTEROL 0.5-2.5 (3) MG/3ML IN SOLN
3.0000 mL | Freq: Once | RESPIRATORY_TRACT | Status: AC
  Administered 2017-10-24: 3 mL via RESPIRATORY_TRACT
  Filled 2017-10-24: qty 3

## 2017-10-24 MED ORDER — METHYLPREDNISOLONE SODIUM SUCC 40 MG IJ SOLR
40.0000 mg | Freq: Three times a day (TID) | INTRAMUSCULAR | Status: DC
  Administered 2017-10-24 – 2017-10-29 (×15): 40 mg via INTRAVENOUS
  Filled 2017-10-24 (×15): qty 1

## 2017-10-24 MED ORDER — SODIUM CHLORIDE 0.9% FLUSH: 3.0000 mL | INTRAVENOUS | Status: DC | PRN

## 2017-10-24 MED ORDER — TIZANIDINE HCL 2 MG PO TABS
2.0000 mg | ORAL_TABLET | Freq: Three times a day (TID) | ORAL | Status: DC | PRN
  Filled 2017-10-24: qty 1

## 2017-10-24 MED ORDER — SODIUM CHLORIDE 0.9% FLUSH
3.0000 mL | Freq: Two times a day (BID) | INTRAVENOUS | Status: DC
  Administered 2017-10-25 – 2017-10-29 (×8): 3 mL via INTRAVENOUS

## 2017-10-24 MED ORDER — CARVEDILOL 3.125 MG PO TABS
3.1250 mg | ORAL_TABLET | Freq: Two times a day (BID) | ORAL | Status: DC
  Administered 2017-10-25 – 2017-10-29 (×10): 3.125 mg via ORAL
  Filled 2017-10-24 (×11): qty 1

## 2017-10-24 MED ORDER — FUROSEMIDE 10 MG/ML IJ SOLN
60.0000 mg | Freq: Once | INTRAMUSCULAR | Status: AC
Start: 2017-10-24 — End: 2017-10-24
  Administered 2017-10-24: 60 mg via INTRAVENOUS
  Filled 2017-10-24: qty 6

## 2017-10-24 MED ORDER — METHYLPREDNISOLONE SODIUM SUCC 125 MG IJ SOLR
125.0000 mg | Freq: Once | INTRAMUSCULAR | Status: AC
  Administered 2017-10-24: 125 mg via INTRAVENOUS
  Filled 2017-10-24: qty 2

## 2017-10-24 MED ORDER — APIXABAN 5 MG PO TABS
5.0000 mg | ORAL_TABLET | Freq: Two times a day (BID) | ORAL | Status: DC
  Administered 2017-10-24 – 2017-10-29 (×10): 5 mg via ORAL
  Filled 2017-10-24 (×11): qty 1

## 2017-10-24 MED ORDER — ZOLPIDEM TARTRATE 5 MG PO TABS: 5.0000 mg | ORAL_TABLET | Freq: Every evening | ORAL | Status: DC | PRN

## 2017-10-24 MED ORDER — SODIUM CHLORIDE 0.9 % IV SOLN: 250.0000 mL | INTRAVENOUS | Status: DC | PRN

## 2017-10-24 MED ORDER — ALBUTEROL SULFATE (2.5 MG/3ML) 0.083% IN NEBU: 2.5000 mg | INHALATION_SOLUTION | RESPIRATORY_TRACT | Status: DC | PRN

## 2017-10-24 MED ORDER — GABAPENTIN 400 MG PO CAPS
1200.0000 mg | ORAL_CAPSULE | Freq: Every day | ORAL | Status: DC
  Administered 2017-10-24 – 2017-10-28 (×5): 1200 mg via ORAL
  Filled 2017-10-24 (×5): qty 3

## 2017-10-24 MED ORDER — ONDANSETRON HCL 4 MG/2ML IJ SOLN: 4.0000 mg | Freq: Four times a day (QID) | INTRAMUSCULAR | Status: DC | PRN

## 2017-10-24 MED ORDER — IOPAMIDOL (ISOVUE-370) INJECTION 76%
INTRAVENOUS | Status: AC
  Administered 2017-10-24: 100 mL
  Filled 2017-10-24: qty 100

## 2017-10-24 MED ORDER — GABAPENTIN 400 MG PO CAPS
400.0000 mg | ORAL_CAPSULE | Freq: Every day | ORAL | Status: DC
  Administered 2017-10-25 – 2017-10-29 (×4): 400 mg via ORAL
  Filled 2017-10-24 (×5): qty 1

## 2017-10-24 MED ORDER — DICLOFENAC SODIUM 1 % TD GEL
2.0000 g | Freq: Two times a day (BID) | TRANSDERMAL | Status: DC | PRN
Start: 2017-10-24 — End: 2017-10-29
  Filled 2017-10-24: qty 100

## 2017-10-24 MED ORDER — UMECLIDINIUM-VILANTEROL 62.5-25 MCG/INH IN AEPB
1.0000 | INHALATION_SPRAY | Freq: Every day | RESPIRATORY_TRACT | Status: DC
  Administered 2017-10-26 – 2017-10-29 (×4): 1 via RESPIRATORY_TRACT
  Filled 2017-10-24: qty 14

## 2017-10-24 NOTE — ED Notes (Signed)
Patient transported to CT 

## 2017-10-24 NOTE — ED Notes (Signed)
Pt very short of breath getting up from the bedside commode to get back in the bed, O2 sats at 93% on 5L

## 2017-10-24 NOTE — ED Triage Notes (Signed)
Pt c/o shortness of breath, her home O2 ran out at 1430 today. SpO2 64% room air. Denies chest pain, hx NSTEMI in Feb 2019 and COPD. Placed on 8L of 02 in triage, SpO2 improved to 92%, titrated O2 to 4L.

## 2017-10-24 NOTE — H&P (Signed)
History and Physical    Janice Morrow WGN:[id number] DOB: 1946-04-10 DOA: 10/24/2017  PCP: Janith Lima, MD   Patient coming from: Home  Chief Complaint: SOB, wheezing, productive cough, RLE swelling and tenderness  HPI: Janice Morrow is a 72 y.o. female with medical history significant for COPD with chronic hypoxic respiratory failure, chronic combined CHF, chronic back pain, history of PE on Eliquis, and recent NSTEMI, presenting to the emergency department for evaluation of worsening dyspnea, productive cough, wheezing, and swelling and tenderness in the right lower extremity.  She was discharged from the hospital 3 weeks ago after medical management of non-STEMI and acute on chronic combined CHF.  She remained dyspneic at time of discharge, but was much improved and stable.  She was saturating in the low 60s when she saw her PCP for hospital follow-up 10 days ago, had her home FiO2 increased, and improved slightly with that for a while.  She now reports continued progression in her dyspnea but denies chest pain or fevers.  She reports increased swelling in her lower extremities, particularly the right leg which she states is also tender.  She takes Eliquis, but has been using 10 mg once daily instead of the prescribed 5 mg twice daily.  She was in the pain management clinic earlier today with O2 saturation in the mid 60s, and was directed to the ED.  ED Course: Upon arrival to the ED, patient is found to be afebrile, saturating adequately on 8 L/min of supplemental oxygen, and with vitals otherwise normal.  EKG features a sinus rhythm with fusion complexes, PAC, and LAD.  Chest x-ray is notable for cardiomegaly with pulmonary vascular congestion and a tiny right pleural effusion.  Chemistry panel and CBC are unremarkable and troponin is within normal limits.  Patient was treated with 60 mg IV Lasix, 125 mg IV Solu-Medrol, and DuoNeb in the ED.  She remains stable at rest, but develops  respiratory distress with minimal exertion.  She will be admitted to the telemetry unit for ongoing evaluation and management of acute on chronic hypoxic respiratory failure, likely multifactorial in etiology.  Review of Systems:  All other systems reviewed and apart from HPI, are negative.  Past Medical History:  Diagnosis Date  . Acute diastolic CHF (congestive heart failure) ([place]) 08/24/2015  . Cervicalgia   . Chronic back pain   . COPD (chronic obstructive pulmonary disease) ([place])   . Essential hypertension 08/24/2015  . GERD (gastroesophageal reflux disease)   . Headache(784.0)   . Heart murmur   . History of kidney stones   . Hyperlipidemia   . Migraine without aura, with intractable migraine, so stated, without mention of status migrainosus   . OSA on CPAP    patient denies sleep apnea uses CPAP to get oxygent o brain  . Osteoarthritis   . Pulmonary embolism ([place])   . PVD (peripheral vascular disease) ([place])   . PVD (peripheral vascular disease) ([place])   . Seizures ([place]) 07/2014, 10/2014   due to baclofen  . Shortness of breath dyspnea   . Trigeminal neuralgia     Past Surgical History:  Procedure Laterality Date  . LEFT HEART CATH AND CORONARY ANGIOGRAPHY N/A 10/01/2017   Procedure: LEFT HEART CATH AND CORONARY ANGIOGRAPHY;  Surgeon: Leonie Man, MD;  Location: [facility];  Service: Cardiovascular;  Laterality: N/A;  . MULTIPLE TOOTH EXTRACTIONS     "they took out part of my teeth; I took out the rest when  they got loose; was suppose to get dentures; never did"  . NO PAST SURGERIES    . URETEROSCOPY WITH HOLMIUM LASER LITHOTRIPSY Left 07/09/2017   Procedure: URETEROSCOPY WITH HOLMIUM LASER LITHOTRIPSY/ STENT;  Surgeon: Festus Aloe, MD;  Location: [facility];  Service: Urology;  Laterality: Left;  . URETEROSCOPY WITH HOLMIUM LASER LITHOTRIPSY Left 07/23/2017   Procedure: URETEROSCOPY WITH HOLMIUM LASER LITHOTRIPSY /STENT;  Surgeon: Festus Aloe, MD;   Location: [facility];  Service: Urology;  Laterality: Left;  NEEDS 60 MINUTES FOR PROCEDURE     reports that she quit smoking about 7 years ago. Her smoking use included cigarettes. she has never used smokeless tobacco. She reports that she does not drink alcohol or use drugs.  Allergies  Allergen Reactions  . Baclofen Other (See Comments)    Causes seizures  . Naproxen     Palpitations     Family History  Problem Relation Age of Onset  . Heart attack Father   . Diabetes Mother   . Dementia Mother   . Diabetes Brother   . Heart disease Unknown   . Diabetes Unknown      Prior to Admission medications   Medication Sig Start Date End Date Taking? Authorizing Provider  apixaban (ELIQUIS) 5 MG TABS tablet Take 1 tablet (5 mg total) by mouth 2 (two) times daily. Patient taking differently: Take 10 mg by mouth daily.  01/30/17  Yes Janith Lima, MD  carvedilol (COREG) 3.125 MG tablet TAKE 1 TABLET BY MOUTH 2 TIMES DAILY WITH A MEAL 08/07/17  Yes Janith Lima, MD  diclofenac sodium (VOLTAREN) 1 % GEL Apply 2 g topically 2 (two) times daily as needed (knee pain).   Yes [provider]  furosemide (LASIX) 40 MG tablet Take 1 tablet (40 mg total) by mouth daily. 07/19/17  Yes Janith Lima, MD  gabapentin (NEURONTIN) 400 MG capsule Take 400-1,200 mg by mouth See admin instructions. TAKE 1 CAPSULE AT LUNCH, TAKE 2 CAPSULES AT SUPPER AND 3 CAPSULES AT BEDTIME    Yes [provider]  HYDROcodone-acetaminophen (NORCO) 10-325 MG tablet Take 1 tablet by mouth every 6 (six) hours as needed. Patient taking differently: Take 1 tablet by mouth every 6 (six) hours as needed for moderate pain.  09/15/15  Yes Janith Lima, MD  OXYGEN Inhale 4 L into the lungs.   Yes [provider]  umeclidinium-vilanterol (ANORO ELLIPTA) 62.5-25 MCG/INH AEPB Inhale 1 puff into the lungs daily. 05/10/17  Yes Janith Lima, MD  tiZANidine (ZANAFLEX) 2 MG tablet Take 1 tablet (2 mg total) by  mouth every 8 (eight) hours as needed. For migraine 10/14/17   Janith Lima, MD  traMADol (ULTRAM) 50 MG tablet Take 1 tablet (50 mg total) by mouth every 6 (six) hours as needed. Patient not taking: Reported on 10/24/2017 07/23/17 07/23/18  Festus Aloe, MD    Physical Exam: Vitals:   10/24/17 1738 10/24/17 1915 10/24/17 1944 10/24/17 2013  BP: (!) 187/86 (!) 122/58 (!) 103/91 (!) 134/93  Pulse: 86 88 79 84  Resp: (!) 22 20 16 18   Temp: 99.1 F (37.3 C)     TempSrc: Oral     SpO2: 95% 100% 94% 96%  Height:          Constitutional: calm, dyspneic with speech, no pallor, no diaphoresis Eyes: PERTLA, lids and conjunctivae normal ENMT: Mucous membranes are moist. Posterior pharynx clear of any exudate or lesions.   Neck: normal, supple, no masses,  no thyromegaly Respiratory: Diminished breath sounds bilaterally, expiratory wheezes. No accessory muscle use.  Cardiovascular: S1 & S2 heard, regular rate and rhythm. Bilateral LE edema, right > left. Abdomen: No distension, no tenderness, no masses palpated. Bowel sounds normal.  Musculoskeletal: Right lower leg tenderness. No joint deformity upper and lower extremities.   Skin: no significant rashes, lesions, ulcers. Warm, dry, well-perfused. Neurologic: CN 2-12 grossly intact. Sensation intact. Strength 5/5 in all 4 limbs.  Psychiatric: Alert and oriented x 3. Pleasant and cooperative.     Labs on Admission: I have personally reviewed following labs and imaging studies  CBC: Recent Labs  Lab 10/24/17 1754  WBC 8.5  HGB 12.9  HCT 44.1  MCV 93.4  PLT 924   Basic Metabolic Panel: Recent Labs  Lab 10/24/17 1754  NA 141  K 3.9  CL 101  CO2 30  GLUCOSE 128*  BUN 11  CREATININE 0.95  CALCIUM 8.8*   GFR: CrCl cannot be calculated (Unknown ideal weight.). Liver Function Tests: No results for input(s): AST, ALT, ALKPHOS, BILITOT, PROT, ALBUMIN in the last 168 hours. No results for input(s): LIPASE, AMYLASE in the  last 168 hours. No results for input(s): AMMONIA in the last 168 hours. Coagulation Profile: No results for input(s): INR, PROTIME in the last 168 hours. Cardiac Enzymes: No results for input(s): CKTOTAL, CKMB, CKMBINDEX, TROPONINI in the last 168 hours. BNP (last 3 results) No results for input(s): PROBNP in the last 8760 hours. HbA1C: No results for input(s): HGBA1C in the last 72 hours. CBG: No results for input(s): GLUCAP in the last 168 hours. Lipid Profile: No results for input(s): CHOL, HDL, LDLCALC, TRIG, CHOLHDL, LDLDIRECT in the last 72 hours. Thyroid Function Tests: No results for input(s): TSH, T4TOTAL, FREET4, T3FREE, THYROIDAB in the last 72 hours. Anemia Panel: No results for input(s): VITAMINB12, FOLATE, FERRITIN, TIBC, IRON, RETICCTPCT in the last 72 hours. Urine analysis:    Component Value Date/Time   COLORURINE AMBER (A) 10/01/2017 0318   APPEARANCEUR HAZY (A) 10/01/2017 0318   LABSPEC 1.024 10/01/2017 0318   PHURINE 5.0 10/01/2017 0318   GLUCOSEU NEGATIVE 10/01/2017 0318   GLUCOSEU NEGATIVE 05/20/2017 1705   HGBUR SMALL (A) 10/01/2017 0318   BILIRUBINUR NEGATIVE 10/01/2017 0318   KETONESUR NEGATIVE 10/01/2017 0318   PROTEINUR 30 (A) 10/01/2017 0318   UROBILINOGEN 0.2 05/20/2017 1705   NITRITE NEGATIVE 10/01/2017 0318   LEUKOCYTESUR MODERATE (A) 10/01/2017 0318   Sepsis Labs: @LABRCNTIP (procalcitonin:4,lacticidven:4) )No results found for this or any previous visit (from the past 240 hour(s)).   Radiological Exams on Admission: Dg Chest 2 View  Result Date: 10/24/2017 CLINICAL DATA:  Shortness of breath EXAM: CHEST  2 VIEW COMPARISON:  10/02/2017 FINDINGS: AP and lateral views of the chest show hyperexpansion. The cardio pericardial silhouette is enlarged. Soft tissue opacity in the right hilum similar to prior and compatible with vascularity as seen on CT of 01/16/2017. There is pulmonary vascular congestion without overt pulmonary edema. Tiny pleural  effusion. IMPRESSION: Cardiomegaly with vascular congestion and tiny right effusion. Electronically Signed   By: Misty Stanley M.D.   On: 10/24/2017 18:34    EKG: Independently reviewed. Sinus rhythm with fusion complexes, PAC, LAD.   Assessment/Plan  1. Acute on chronic hypoxic respiratory failure  - Presents from pain management clinic where she was noted be saturating in low 60's  - Reports increased dyspnea since hospital discharge 3 weeks ago, transiently improved when PCP increased her FiO2 last week  - Denies chest  pain or fevers, reports increased swelling and tenderness in RLE, productive cough, and wheezing  - There was concern for combination of acute CHF and COPD in ED and she was treated with IV Lasix, IV Solu-Medrol, and nebs  - There is concern for PE given hx of such and reports of increased swelling and tenderness in RLE; she takes Eliquis, but has been using once daily instead of twice  - CTA chest ordered, check weight now, continue treatment of CHF and COPD as below    2. Acute on chronic combined systolic & diastolic CHF  - Exam is limited by body habitus, will be important to check weight now; she was 340 lb when discharged on 2/7 after diuresis  - She had normal EF and grade 1 diastolic dysfunction by TTE last year, 40-45% on cath earlier this month - Managed with Lasix 40 mg qD at home  - Reports increased swelling in right leg, chronic orthopnea  - Treated in ED with Lasix 60 mg IV  - Continue cardiac monitoring, SLIV, fluid-restrict diet, follow daily wt and I/O's, continue diuresis with Lasix 40 mg IV q12h, continue Coreg   3. COPD with acute exacerbation  - Presents with hypoxic, wheezing, increased cough  - Treated in ED with 125 mg IV Solu-Medrol and DuoNeb  - Continue systemic steroid, continue Anoro, continue supplemental O2, and prn albuterol nebs   4. History of PE  - Takes Eliquis, but has been using 10 mg qAM instead of 5 mg BID  - As noted above, she  is profoundly hypoxic with right leg soreness and increased RLE edema relative to left  - CTA ordered, continue Eliquis for now    5. Chronic pain  - Stable  - Continue home-regimen with Neurontin, dicolfenac gel, prn Zanaflex, and prn Norco    6. OSA/OHS  - CPAP qHS    DVT prophylaxis: Eliquis  Code Status: Full  Family Communication: Discussed with patient Disposition Plan: Admit to telemetry Consults called: None Admission status: Inpatient    Vianne Bulls, MD [facility] Pager [phone]  If 7PM-7AM, please contact night-coverage [email or web address] Password [facility]  10/24/2017, 9:08 PM

## 2017-10-24 NOTE — Progress Notes (Signed)
RT asked pt if she wanted to wear CPAP tonight. Pt refused stating she would rather be left on O2 throughout the night.  RT made pt aware if she were to change her mind to let RN know and they will let RT know. RT will continue to monitor as needed

## 2017-10-24 NOTE — ED Notes (Signed)
Pt's O2 level dropped to 83% while on 5L, RN found pt sleeping.  Once pt woke up, O2 levels returned to 96%.  Provider notified

## 2017-10-24 NOTE — ED Notes (Signed)
Attempted to call report

## 2017-10-24 NOTE — ED Provider Notes (Signed)
[facility] EMERGENCY DEPARTMENT Provider Note   CSN: [id number] Arrival date & time: 10/24/17  1724     History   Chief Complaint Chief Complaint  Patient presents with  . Shortness of Breath    HPI Janice Morrow is a 72 y.o. female.  Pt presents to the ED today with SOB.  Pt wears chronic home O2 and thought her O2 tank was full.  She went to the pain clinic and her O2 sats were low, so they told her to come to the ED.  The pt's RA O2 sat 64% when she arrived here.  Pt initially placed on 8L, but is now back on 4L and is 94%.  Pt was admitted from 2/4-2/7 with NSTEMI and CHF exacerbation.  No stent required.  EF 40-45% during cath.  Pt saw her doctor on 2/18 for follow up with continued sob.  Home O2 increased from 3 to 4L.        Past Medical History:  Diagnosis Date  . Acute diastolic CHF (congestive heart failure) ([place]) 08/24/2015  . Cervicalgia   . Chronic back pain   . COPD (chronic obstructive pulmonary disease) ([place])   . Essential hypertension 08/24/2015  . GERD (gastroesophageal reflux disease)   . Headache(784.0)   . Heart murmur   . History of kidney stones   . Hyperlipidemia   . Migraine without aura, with intractable migraine, so stated, without mention of status migrainosus   . OSA on CPAP    patient denies sleep apnea uses CPAP to get oxygent o brain  . Osteoarthritis   . Pulmonary embolism ([place])   . PVD (peripheral vascular disease) ([place])   . PVD (peripheral vascular disease) ([place])   . Seizures ([place]) 07/2014, 10/2014   due to baclofen  . Shortness of breath dyspnea   . Trigeminal neuralgia     Patient Active Problem List   Diagnosis Date Noted  . Primary osteoarthritis of both knees 10/14/2017  . Acute on chronic combined systolic and diastolic CHF (congestive heart failure) ([place]) 10/01/2017  . Type 2 diabetes mellitus with complication, without long-term current use of insulin ([place]) 05/20/2017  . History of pulmonary embolism  01/30/2017  . Acute on chronic respiratory failure with hypoxia ([place]) 01/16/2017  . Kidney stone on left side   . History of colonic polyps 07/31/2016  . Essential hypertension 08/24/2015  . Seizures ([place]) 08/24/2015  . Obesity hypoventilation syndrome ([place]) 08/24/2015  . Migraine without aura and with status migrainosus, not intractable 07/12/2015  . OSA on CPAP 07/12/2015  . Left ventricular diastolic dysfunction, NYHA class 1 04/11/2015  . Visit for screening mammogram 03/08/2015  . Trigeminal neuralgia of right side of face 11/23/2014  . Chronic back pain 11/26/2013  . Hyperlipidemia with target LDL less than 130 09/01/2010  . Severe obesity (BMI >= 40) ([place]) 09/01/2010  . COPD with acute exacerbation ([place]) 09/01/2010  . GERD 09/01/2010    Past Surgical History:  Procedure Laterality Date  . LEFT HEART CATH AND CORONARY ANGIOGRAPHY N/A 10/01/2017   Procedure: LEFT HEART CATH AND CORONARY ANGIOGRAPHY;  Surgeon: Leonie Man, MD;  Location: [facility];  Service: Cardiovascular;  Laterality: N/A;  . MULTIPLE TOOTH EXTRACTIONS     "they took out part of my teeth; I took out the rest when they got loose; was suppose to get dentures; never did"  . NO PAST SURGERIES    . URETEROSCOPY WITH HOLMIUM LASER LITHOTRIPSY Left 07/09/2017  Procedure: URETEROSCOPY WITH HOLMIUM LASER LITHOTRIPSY/ STENT;  Surgeon: Festus Aloe, MD;  Location: [facility];  Service: Urology;  Laterality: Left;  . URETEROSCOPY WITH HOLMIUM LASER LITHOTRIPSY Left 07/23/2017   Procedure: URETEROSCOPY WITH HOLMIUM LASER LITHOTRIPSY /STENT;  Surgeon: Festus Aloe, MD;  Location: [facility];  Service: Urology;  Laterality: Left;  NEEDS 60 MINUTES FOR PROCEDURE    OB History    Gravida Para Term Preterm AB Living   4 1 1   3  0   SAB TAB Ectopic Multiple Live Births   3              Obstetric Comments   Dec in [place] Medications    Prior to Admission medications   Medication Sig Start  Date End Date Taking? Authorizing Provider  apixaban (ELIQUIS) 5 MG TABS tablet Take 1 tablet (5 mg total) by mouth 2 (two) times daily. Patient taking differently: Take 10 mg by mouth daily.  01/30/17  Yes Janith Lima, MD  carvedilol (COREG) 3.125 MG tablet TAKE 1 TABLET BY MOUTH 2 TIMES DAILY WITH A MEAL 08/07/17  Yes Janith Lima, MD  diclofenac sodium (VOLTAREN) 1 % GEL Apply 2 g topically 2 (two) times daily as needed (knee pain).   Yes [provider]  furosemide (LASIX) 40 MG tablet Take 1 tablet (40 mg total) by mouth daily. 07/19/17  Yes Janith Lima, MD  gabapentin (NEURONTIN) 400 MG capsule Take 400-1,200 mg by mouth See admin instructions. TAKE 1 CAPSULE AT LUNCH, TAKE 2 CAPSULES AT SUPPER AND 3 CAPSULES AT BEDTIME    Yes [provider]  HYDROcodone-acetaminophen (NORCO) 10-325 MG tablet Take 1 tablet by mouth every 6 (six) hours as needed. Patient taking differently: Take 1 tablet by mouth every 6 (six) hours as needed for moderate pain.  09/15/15  Yes Janith Lima, MD  OXYGEN Inhale 4 L into the lungs.   Yes [provider]  umeclidinium-vilanterol (ANORO ELLIPTA) 62.5-25 MCG/INH AEPB Inhale 1 puff into the lungs daily. 05/10/17  Yes Janith Lima, MD  tiZANidine (ZANAFLEX) 2 MG tablet Take 1 tablet (2 mg total) by mouth every 8 (eight) hours as needed. For migraine 10/14/17   Janith Lima, MD  traMADol (ULTRAM) 50 MG tablet Take 1 tablet (50 mg total) by mouth every 6 (six) hours as needed. Patient not taking: Reported on 10/24/2017 07/23/17 07/23/18  Festus Aloe, MD    Family History Family History  Problem Relation Age of Onset  . Heart attack Father   . Diabetes Mother   . Dementia Mother   . Diabetes Brother   . Heart disease Unknown   . Diabetes Unknown     Social History Social History   Tobacco Use  . Smoking status: Former Smoker    Types: Cigarettes    Last attempt to quit: 08/22/2010    Years since quitting: 7.1    . Smokeless tobacco: Never Used  Substance Use Topics  . Alcohol use: No    Alcohol/week: 0.0 oz  . Drug use: No     Allergies   Baclofen and Naproxen   Review of Systems Review of Systems  Respiratory: Positive for shortness of breath.   All other systems reviewed and are negative.    Physical Exam Updated Vital Signs BP (!) 134/93   Pulse 84   Temp 99.1 F (37.3 C) (Oral)   Resp 18   Ht 5\' 5"  (  1.651 m)   LMP 08/27/1993 (Approximate)   SpO2 96%   BMI 56.64 kg/m   Physical Exam  Constitutional: She is oriented to person, place, and time. She appears well-developed. She appears ill.  HENT:  Head: Normocephalic.  Mouth/Throat: Oropharynx is clear and moist.  Eyes: EOM are normal. Pupils are equal, round, and reactive to light.  Neck: Normal range of motion. Neck supple.  Cardiovascular: Normal rate and regular rhythm.  Pulmonary/Chest: Tachypnea noted. She has wheezes.  Abdominal: Soft. Bowel sounds are normal.  Musculoskeletal: Normal range of motion.       Right lower leg: She exhibits edema.       Left lower leg: She exhibits edema.  Neurological: She is alert and oriented to person, place, and time.  Skin: Skin is warm. Capillary refill takes less than 2 seconds.  Psychiatric: She has a normal mood and affect. Her behavior is normal.  Nursing note and vitals reviewed.    ED Treatments / Results  Labs (all labs ordered are listed, but only abnormal results are displayed) Labs Reviewed  BASIC METABOLIC PANEL - Abnormal; Notable for the following components:      Result Value   Glucose, Bld 128 (*)    Calcium 8.8 (*)    GFR calc non Af Amer 60 (*)    All other components within normal limits  CBC - Abnormal; Notable for the following components:   MCHC 29.3 (*)    RDW 16.2 (*)    All other components within normal limits  BRAIN NATRIURETIC PEPTIDE  I-STAT TROPONIN, ED    EKG  EKG Interpretation  Date/Time:  Thursday October 24 2017 17:36:36  EST Ventricular Rate:  86 PR Interval:  194 QRS Duration: 86 QT Interval:  360 QTC Calculation: 430 R Axis:   -47 Text Interpretation:  Sinus rhythm with Fusion complexes and Premature atrial complexes Left axis deviation Anterior infarct , age undetermined Abnormal ECG No significant change since last tracing Confirmed by Isla Pence [phone]) on 10/24/2017 6:04:51 PM       Radiology Dg Chest 2 View  Result Date: 10/24/2017 CLINICAL DATA:  Shortness of breath EXAM: CHEST  2 VIEW COMPARISON:  10/02/2017 FINDINGS: AP and lateral views of the chest show hyperexpansion. The cardio pericardial silhouette is enlarged. Soft tissue opacity in the right hilum similar to prior and compatible with vascularity as seen on CT of 01/16/2017. There is pulmonary vascular congestion without overt pulmonary edema. Tiny pleural effusion. IMPRESSION: Cardiomegaly with vascular congestion and tiny right effusion. Electronically Signed   By: Misty Stanley M.D.   On: 10/24/2017 18:34    Procedures Procedures (including critical care time)  Medications Ordered in ED Medications  ipratropium-albuterol (DUONEB) 0.5-2.5 (3) MG/3ML nebulizer solution 3 mL (3 mLs Nebulization Given 10/24/17 1907)  furosemide (LASIX) injection 60 mg (60 mg Intravenous Given 10/24/17 1907)  methylPREDNISolone sodium succinate (SOLU-MEDROL) 125 mg/2 mL injection 125 mg (125 mg Intravenous Given 10/24/17 2037)     Initial Impression / Assessment and Plan / ED Course  I have reviewed the triage vital signs and the nursing notes.  Pertinent labs & imaging results that were available during my care of the patient were reviewed by me and considered in my medical decision making (see chart for details).   Pt still very sob with movement.  Just to get up to use bedside commode, she gets very sob.  O2 sats drop to 81% when she falls asleep.  She likely has sleep apnea.  I think today's hypoxia is due to a combined CHF and COPD.  Pt d/w  Dr. Myna Hidalgo ([facility]) for admission.  Final Clinical Impressions(s) / ED Diagnoses   Final diagnoses:  COPD exacerbation ([place])  Acute on chronic congestive heart failure, unspecified heart failure type [facility])  Hypoxia    ED Discharge Orders    None       Isla Pence, MD 10/24/17 2053

## 2017-10-25 ENCOUNTER — Other Ambulatory Visit: Payer: Self-pay

## 2017-10-25 ENCOUNTER — Encounter (HOSPITAL_COMMUNITY): Payer: Self-pay | Admitting: *Deleted

## 2017-10-25 ENCOUNTER — Inpatient Hospital Stay (HOSPITAL_COMMUNITY): Payer: Medicare Other

## 2017-10-25 DIAGNOSIS — J9621 Acute and chronic respiratory failure with hypoxia: Secondary | ICD-10-CM

## 2017-10-25 DIAGNOSIS — I5030 Unspecified diastolic (congestive) heart failure: Secondary | ICD-10-CM

## 2017-10-25 LAB — BASIC METABOLIC PANEL
ANION GAP: 9 (ref 5–15)
BUN: 11 mg/dL (ref 6–20)
CALCIUM: 8.8 mg/dL — AB (ref 8.9–10.3)
CO2: 32 mmol/L (ref 22–32)
CREATININE: 0.91 mg/dL (ref 0.44–1.00)
Chloride: 100 mmol/L — ABNORMAL LOW (ref 101–111)
Glucose, Bld: 184 mg/dL — ABNORMAL HIGH (ref 65–99)
Potassium: 4.3 mmol/L (ref 3.5–5.1)
Sodium: 141 mmol/L (ref 135–145)

## 2017-10-25 LAB — ECHOCARDIOGRAM COMPLETE
HEIGHTINCHES: 65 in
Weight: 5527.37 oz

## 2017-10-25 LAB — GLUCOSE, CAPILLARY
Glucose-Capillary: 234 mg/dL — ABNORMAL HIGH (ref 65–99)
Glucose-Capillary: 100 mg/dL — ABNORMAL HIGH (ref 65–99)

## 2017-10-25 NOTE — Progress Notes (Signed)
Patient ID: Janice Morrow, female   DOB: 1946/07/17, 72 y.o.   MRN: [id number]  PROGRESS NOTE    AGAPE HARDIMAN  GHW:[id number] DOB: September 29, 1945 DOA: 10/24/2017 PCP: Janith Lima, MD   Outpatient Specialists: Glenetta Hew, Cardiology  Brief Narrative: this is 72 year old female with history of COPD as well as CHF who was admitted with acute on chronic steroid failure with significant hypoxemia. Patient was found to have pulmonary edema. She also was suspected to have PE but CT angiogram of the chest was negative. She is being treated for COPD as well as CHF exacerbation. Patient was requiring up to 8 L of oxygen at the time of admission.  Assessment & Plan:   Principal Problem:   Acute on chronic respiratory failure with hypoxia (HCC) Active Problems:   COPD with acute exacerbation (HCC)   Chronic back pain   OSA on CPAP   Essential hypertension   Obesity hypoventilation syndrome (HCC)   History of pulmonary embolism   Acute on chronic combined systolic and diastolic CHF (congestive heart failure) (HCC)   #1 acute on chronic respiratory failure with hypoxemia: Patient appears to be improving. Most likely due to combination of COPD and CHF exacerbation. We'll continue with IV Lasix as well as Solu-Medrol and nebulizer. Patient down to 4 L of oxygen this morning we'll continue titrated down to her home dose.  #2 obstructive sleep apnea: Patient has been refusing to use has CPAP at night here. Counseling provided regarding the use of CPAP.  #3 history of pulmonary embolism: Patient has not been taking her medications accurately at home. With mid adjustment and she is on full anticoagulation now  #4 hypertension: Blood pressure appears well controlled. Continue current regimen  #5 morbid obesity: The tracheostomy. Continue close monitoring   DVT prophylaxis: Eliquis  Code Status: Full  Family Communication: Discussed with Patient. No family available  Disposition Plan: Home  with Home health  Consultants:   None  Procedures: CT angiogram of the chest showing no evidence of pulmonary embolism  Antimicrobials: None  Subjective: Patient is improving overnight. She is breathing better. She denied any chest pain. Oxygen demand has reduced. No fever or chills. She is complaining of pain in her left hand with small area of induration. Denied any trauma  Objective: Vitals:   10/24/17 2013 10/24/17 2130 10/24/17 2305 10/25/17 0504  BP: (!) 134/93 (!) 156/145 (!) 142/119 (!) 141/80  Pulse: 84 80 (!) 106 86  Resp: 18 20    Temp:   99.6 F (37.6 C) 98.1 F (36.7 C)  TempSrc:   Oral Oral  SpO2: 96% 93% 93% 99%  Weight:   (!) 156.8 kg (345 lb 9.6 oz) (!) 156.7 kg (345 lb 7.4 oz)  Height:   5\' 5"  (1.651 m)     Intake/Output Summary (Last 24 hours) at 10/25/2017 0851 Last data filed at 10/25/2017 0738 Gross per 24 hour  Intake -  Output 3000 ml  Net -3000 ml   Filed Weights   10/24/17 2305 10/25/17 0504  Weight: (!) 156.8 kg (345 lb 9.6 oz) (!) 156.7 kg (345 lb 7.4 oz)    Examination:  General exam: Appears calm and comfortable  Respiratory system: Clear to auscultation. Respiratory effort normal. Cardiovascular system: S1 & S2 heard, RRR. No JVD, murmurs, rubs, gallops or clicks. No pedal edema. Gastrointestinal system: Abdomen is nondistended, soft and nontender. No organomegaly or masses felt. Normal bowel sounds heard. Central nervous system: Alert and oriented. No focal  neurological deficits. Extremities: Symmetric 5 x 5 power. Skin: No rashes, lesions or ulcers Psychiatry: Judgement and insight appear normal. Mood & affect appropriate.     Data Reviewed: I have personally reviewed following labs and imaging studies  CBC: Recent Labs  Lab 10/24/17 1754  WBC 8.5  HGB 12.9  HCT 44.1  MCV 93.4  PLT 229   Basic Metabolic Panel: Recent Labs  Lab 10/24/17 1754 10/25/17 0334  NA 141 141  K 3.9 4.3  CL 101 100*  CO2 30 32  GLUCOSE 128*  184*  BUN 11 11  CREATININE 0.95 0.91  CALCIUM 8.8* 8.8*   GFR: Estimated Creatinine Clearance: 89.3 mL/min (by C-G formula based on SCr of 0.91 mg/dL). Liver Function Tests: No results for input(s): AST, ALT, ALKPHOS, BILITOT, PROT, ALBUMIN in the last 168 hours. No results for input(s): LIPASE, AMYLASE in the last 168 hours. No results for input(s): AMMONIA in the last 168 hours. Coagulation Profile: No results for input(s): INR, PROTIME in the last 168 hours. Cardiac Enzymes: No results for input(s): CKTOTAL, CKMB, CKMBINDEX, TROPONINI in the last 168 hours. BNP (last 3 results) No results for input(s): PROBNP in the last 8760 hours. HbA1C: No results for input(s): HGBA1C in the last 72 hours. CBG: No results for input(s): GLUCAP in the last 168 hours. Lipid Profile: No results for input(s): CHOL, HDL, LDLCALC, TRIG, CHOLHDL, LDLDIRECT in the last 72 hours. Thyroid Function Tests: No results for input(s): TSH, T4TOTAL, FREET4, T3FREE, THYROIDAB in the last 72 hours. Anemia Panel: No results for input(s): VITAMINB12, FOLATE, FERRITIN, TIBC, IRON, RETICCTPCT in the last 72 hours. Urine analysis:    Component Value Date/Time   COLORURINE AMBER (A) 10/01/2017 0318   APPEARANCEUR HAZY (A) 10/01/2017 0318   LABSPEC 1.024 10/01/2017 0318   PHURINE 5.0 10/01/2017 0318   GLUCOSEU NEGATIVE 10/01/2017 0318   GLUCOSEU NEGATIVE 05/20/2017 1705   HGBUR SMALL (A) 10/01/2017 0318   BILIRUBINUR NEGATIVE 10/01/2017 0318   KETONESUR NEGATIVE 10/01/2017 0318   PROTEINUR 30 (A) 10/01/2017 0318   UROBILINOGEN 0.2 05/20/2017 1705   NITRITE NEGATIVE 10/01/2017 0318   LEUKOCYTESUR MODERATE (A) 10/01/2017 0318   Sepsis Labs: @LABRCNTIP (procalcitonin:4,lacticidven:4)  )No results found for this or any previous visit (from the past 240 hour(s)).       Radiology Studies: Dg Chest 2 View  Result Date: 10/24/2017 CLINICAL DATA:  Shortness of breath EXAM: CHEST  2 VIEW COMPARISON:   10/02/2017 FINDINGS: AP and lateral views of the chest show hyperexpansion. The cardio pericardial silhouette is enlarged. Soft tissue opacity in the right hilum similar to prior and compatible with vascularity as seen on CT of 01/16/2017. There is pulmonary vascular congestion without overt pulmonary edema. Tiny pleural effusion. IMPRESSION: Cardiomegaly with vascular congestion and tiny right effusion. Electronically Signed   By: Misty Stanley M.D.   On: 10/24/2017 18:34   Ct Angio Chest Pe W Or Wo Contrast  Result Date: 10/24/2017 CLINICAL DATA:  72 year old female with shortness of breath. History of PE. EXAM: CT ANGIOGRAPHY CHEST WITH CONTRAST TECHNIQUE: Multidetector CT imaging of the chest was performed using the standard protocol during bolus administration of intravenous contrast. Multiplanar CT image reconstructions and MIPs were obtained to evaluate the vascular anatomy. CONTRAST:  <See Chart> ISOVUE-370 IOPAMIDOL (ISOVUE-370) INJECTION 76% COMPARISON:  Chest radiograph dated 10/24/2017 FINDINGS: Evaluation is limited due to streak artifact caused by patient's arms. Cardiovascular: There is moderate cardiomegaly. No pericardial effusion. There is mild atherosclerotic calcification of the thoracic aorta.  No aneurysmal dilatation or evidence of dissection. There is dilatation of the pulmonary trunk and central pulmonary arteries suggestive of underlying pulmonary hypertension. There is no CT evidence of pulmonary embolism. Mediastinum/Nodes: No hilar or mediastinal adenopathy. The esophagus and the thyroid gland are grossly unremarkable. No mediastinal fluid collection. Lungs/Pleura: There is a small right pleural effusion and associated partial compressive atelectasis of the right lower lobe. There are bibasilar linear atelectasis/scarring. Diffuse interstitial and vascular prominence, likely vascular congestion and edema. Pneumonia is not excluded. Clinical correlation is recommended. There is no  pneumothorax. The central airways are patent. Upper Abdomen: Left renal parenchymal and collecting system calculi. Musculoskeletal: No chest wall abnormality. No acute or significant osseous findings. Review of the MIP images confirms the above findings. IMPRESSION: 1. No CT evidence of pulmonary embolism. 2. Cardiomegaly with evidence of pulmonary hypertension, vascular congestion and edema. 3. Small right pleural effusion. Electronically Signed   By: Anner Crete M.D.   On: 10/24/2017 22:24        Scheduled Meds: . apixaban  5 mg Oral BID  . carvedilol  3.125 mg Oral BID WC  . furosemide  40 mg Intravenous Q12H  . gabapentin  1,200 mg Oral QHS  . gabapentin  400 mg Oral Q lunch  . gabapentin  800 mg Oral Q supper  . methylPREDNISolone (SOLU-MEDROL) injection  40 mg Intravenous Q8H  . sodium chloride flush  3 mL Intravenous Q12H  . umeclidinium-vilanterol  1 puff Inhalation Daily   Continuous Infusions: . sodium chloride       LOS: 1 day    Time spent: 30 minutes    Zalmen Wrightsman,LAWAL, MD [facility] Pager [phone] If 7PM-7AM, please contact night-coverage [email or web address] Password [facility] 10/25/2017, 8:51 AM

## 2017-10-25 NOTE — Progress Notes (Signed)
  Echocardiogram 2D Echocardiogram has been performed.  Janice Morrow 10/25/2017, 11:40 AM

## 2017-10-25 NOTE — Discharge Instructions (Addendum)
Information on my medicine - ELIQUIS (apixaban)  Why was Eliquis prescribed for you? Eliquis was prescribed to treat blood clots that were found in your lungs (pulmonary embolism) in May 2018, and to reduce the risk of them occurring again.  What do You need to know about Eliquis ? Your current dose is ONE 5 mg tablet taken TWICE daily. Eliquis may be taken with or without food.   Try to take the dose about the same time in the morning and in the evening. If you have difficulty swallowing the tablet whole please discuss with your pharmacist how to take the medication safely.  Take Eliquis exactly as prescribed and DO NOT stop taking Eliquis without talking to the doctor who prescribed the medication.  Stopping may increase your risk of developing a new blood clot.  Refill your prescription before you run out.  After discharge, you should have regular check-up appointments with your healthcare provider that is prescribing your Eliquis.    What do you do if you miss a dose? If a dose of ELIQUIS is not taken at the scheduled time, take it as soon as possible on the same day and twice-daily administration should be resumed. The dose should not be doubled to make up for a missed dose.  Important Safety Information A possible side effect of Eliquis is bleeding. You should call your healthcare provider right away if you experience any of the following: ? Bleeding from an injury or your nose that does not stop. ? Unusual colored urine (red or dark brown) or unusual colored stools (red or black). ? Unusual bruising for unknown reasons. ? A serious fall or if you hit your head (even if there is no bleeding).  Some medicines may interact with Eliquis and might increase your risk of bleeding or clotting while on Eliquis. To help avoid this, consult your healthcare provider or pharmacist prior to using any new prescription or non-prescription medications, including herbals, vitamins,  non-steroidal anti-inflammatory drugs (NSAIDs) and supplements.  This website has more information on Eliquis (apixaban): http://www.eliquis.com/eliquis/home

## 2017-10-25 NOTE — Care Management Note (Addendum)
Case Management Note  Patient Details  Name: Janice Morrow MRN: [id number] Date of Birth: Jul 25, 1946  Subjective/Objective:  Pt presented for Acute on Chronic CHF- Pt has DME 3n1 at home.                   Action/Plan: CM did try to speak with patient, however pt asleep and CM with RN staff unable to wake pt up to talk about disposition needs. CM will continue to follow.  Expected Discharge Date:                  Expected Discharge Plan:  [place]  In-House Referral:   N/A  Discharge planning Services  CM Consult  Post Acute Care Choice:    Durable Medical Equipment Choice offered to:   Patient  DME Arranged:   [place] rolling with seat DME Agency:   [facility]  HH Arranged:   Patient Refused [place] Agency:   N/A  Status of Service:  COMPLETED If discussed at [place] of Stay Meetings, dates discussed:    Additional Comments: [place] 10-28-17 Jacqlyn Krauss, RN,BSN [phone] CM did speak with [name] and pt is not listed as having 02 via the company. CM did place a call to [facility] [employer] Liaison to check if 02 with this company. Will continue to monitor.    1023 10-28-17 Jacqlyn Krauss, RN,BSN [phone] CM did speak with patient and she wants the Rollator- DME order placed and Bariatric Rollator to be delivered to pt's room prior to d/c. No further needs from CM at this time.   1609 10-25-17 Jacqlyn Krauss, RN,BSN [phone] CM was able to speak with patient in regards to disposition needs. Pt states she is at home with her brother and she has 02 via [place] and a CPAP that needs replacing- pt not able to think of company she has CPAP through. Pt has a BSC and CM will follow for Rollator. CM did discuss the benefits of a HHRN-pt declined [place] services. Pt will benefit from PT/OT consult once stable. CM will continue to monitor.  Bethena Roys, RN 10/25/2017, 2:39 PM

## 2017-10-26 ENCOUNTER — Inpatient Hospital Stay (HOSPITAL_COMMUNITY): Payer: Medicare Other

## 2017-10-26 ENCOUNTER — Other Ambulatory Visit: Payer: Self-pay

## 2017-10-26 LAB — BASIC METABOLIC PANEL
Anion gap: 9 (ref 5–15)
BUN: 17 mg/dL (ref 6–20)
CO2: 35 mmol/L — AB (ref 22–32)
Calcium: 9.2 mg/dL (ref 8.9–10.3)
Chloride: 95 mmol/L — ABNORMAL LOW (ref 101–111)
Creatinine, Ser: 0.91 mg/dL (ref 0.44–1.00)
GFR calc Af Amer: >60 mL/min (ref >60)
GFR calc non Af Amer: >60 mL/min (ref >60)
GLUCOSE: 155 mg/dL — AB (ref 65–99)
POTASSIUM: 4.7 mmol/L (ref 3.5–5.1)
Sodium: 139 mmol/L (ref 135–145)

## 2017-10-26 MED ORDER — GI COCKTAIL ~~LOC~~: 30.0000 mL | Freq: Once | ORAL | Status: DC

## 2017-10-26 MED ORDER — ALUM & MAG HYDROXIDE-SIMETH 200-200-20 MG/5 ML NICU TOPICAL: 1.0000 "application " | Freq: Once | TOPICAL | Status: DC

## 2017-10-26 MED ORDER — GI COCKTAIL ~~LOC~~
30.0000 mL | Freq: Once | ORAL | Status: AC
  Administered 2017-10-26: 30 mL via ORAL
  Filled 2017-10-26: qty 30

## 2017-10-26 NOTE — Progress Notes (Signed)
Pt  Is complaining of chest pain 8/10. Pain  Is located to the mid chest and feels like a burning sensation. EKG obtained. MD paged. BP is 152/77 P: 84.

## 2017-10-26 NOTE — Progress Notes (Signed)
Patient ID: Janice Morrow, female   DOB: 1946/08/03, 72 y.o.   MRN: [id number]  PROGRESS NOTE    Janice Morrow  [id number]:[id number] DOB: April 13, 1946 DOA: 10/24/2017 PCP: Janith Lima, MD   Outpatient Specialists: Glenetta Hew, Cardiology  Brief Narrative: This is 72 year old female with history of COPD as well as CHF who was admitted with acute on chronic steroid failure with significant hypoxemia. Patient was found to have pulmonary edema. She also was suspected to have PE but CT angiogram of the chest was negative. She is being treated for COPD as well as CHF exacerbation. Patient was requiring up to 8 L of oxygen at the time of admission. She is down to 4 L at the moment which is her home dose.  Assessment & Plan:   Principal Problem:   Acute on chronic respiratory failure with hypoxia (HCC) Active Problems:   COPD with acute exacerbation (HCC)   Chronic back pain   OSA on CPAP   Essential hypertension   Obesity hypoventilation syndrome (HCC)   History of pulmonary embolism   Acute on chronic combined systolic and diastolic CHF (congestive heart failure) ([place])   #1 acute on chronic respiratory failure with hypoxemia: Patient much better today. Still has some shortness of breath and cough. Patient down to 4 L of oxygen this morning we'll continue titrated down to her home dose. Continue diuresis with COPD treatment. If she improves could be discharged home tomorrow  #2 obstructive sleep apnea: Patient has been refusing to use has CPAP at night here. Counseling provided regarding the use of CPAP.  #3 history of pulmonary embolism: Patient has not been taking her medications accurately at home. With mid adjustment and she is on full anticoagulation now  #4 hypertension: Blood pressure appears well controlled. Continue current regimen  #5 morbid obesity: Counseling provided. Continue close monitoring  #6 left hand pain: Obtain x-ray of the hand to evaluate for possible bone  involvement   DVT prophylaxis: Eliquis  Code Status: Full  Family Communication: Discussed with Patient. No family available  Disposition Plan: Home with Home health  Consultants:   None  Procedures: CT angiogram of the chest showing no evidence of pulmonary embolism  Antimicrobials: None  Subjective: Patient is still complaining of left hand pain. Her breathing is much better.  Objective: Vitals:   10/25/17 1730 10/25/17 1959 10/26/17 0410 10/26/17 0955  BP: 105/64 132/66 (!) 132/92 (!) 152/72  Pulse: 88 84 72 84  Resp:  18 17   Temp:  98.4 F (36.9 C) 99.3 F (37.4 C)   TempSrc:  Oral Oral   SpO2:  96% 96%   Weight:   (!) 156.5 kg (345 lb)   Height:        Intake/Output Summary (Last 24 hours) at 10/26/2017 1212 Last data filed at 10/25/2017 2231 Gross per 24 hour  Intake 723 ml  Output 1100 ml  Net -377 ml   Filed Weights   10/24/17 2305 10/25/17 0504 10/26/17 0410  Weight: (!) 156.8 kg (345 lb 9.6 oz) (!) 156.7 kg (345 lb 7.4 oz) (!) 156.5 kg (345 lb)    Examination:  General exam: Appears calm and comfortable  Respiratory system: Clear to auscultation. Respiratory effort normal. Cardiovascular system: S1 & S2 heard, RRR. No JVD, murmurs, rubs, gallops or clicks. No pedal edema. Gastrointestinal system: Abdomen is nondistended, soft and nontender. No organomegaly or masses felt. Normal bowel sounds heard. Central nervous system: Alert and oriented. No focal neurological deficits.  Extremities: Symmetric 5 x 5 power. Skin: No rashes, lesions or ulcers Psychiatry: Judgement and insight appear normal. Mood & affect appropriate.     Data Reviewed: I have personally reviewed following labs and imaging studies  CBC: Recent Labs  Lab 10/24/17 1754  WBC 8.5  HGB 12.9  HCT 44.1  MCV 93.4  PLT 027   Basic Metabolic Panel: Recent Labs  Lab 10/24/17 1754 10/25/17 0334 10/26/17 0230  NA 141 141 139  K 3.9 4.3 4.7  CL 101 100* 95*  CO2 30 32 35*    GLUCOSE 128* 184* 155*  BUN 11 11 17   CREATININE 0.95 0.91 0.91  CALCIUM 8.8* 8.8* 9.2   GFR: Estimated Creatinine Clearance: 89.2 mL/min (by C-G formula based on SCr of 0.91 mg/dL). Liver Function Tests: No results for input(s): AST, ALT, ALKPHOS, BILITOT, PROT, ALBUMIN in the last 168 hours. No results for input(s): LIPASE, AMYLASE in the last 168 hours. No results for input(s): AMMONIA in the last 168 hours. Coagulation Profile: No results for input(s): INR, PROTIME in the last 168 hours. Cardiac Enzymes: No results for input(s): CKTOTAL, CKMB, CKMBINDEX, TROPONINI in the last 168 hours. BNP (last 3 results) No results for input(s): PROBNP in the last 8760 hours. HbA1C: No results for input(s): HGBA1C in the last 72 hours. CBG: Recent Labs  Lab 10/25/17 1203 10/25/17 1634  GLUCAP 234* 100*   Lipid Profile: No results for input(s): CHOL, HDL, LDLCALC, TRIG, CHOLHDL, LDLDIRECT in the last 72 hours. Thyroid Function Tests: No results for input(s): TSH, T4TOTAL, FREET4, T3FREE, THYROIDAB in the last 72 hours. Anemia Panel: No results for input(s): VITAMINB12, FOLATE, FERRITIN, TIBC, IRON, RETICCTPCT in the last 72 hours. Urine analysis:    Component Value Date/Time   COLORURINE AMBER (A) 10/01/2017 0318   APPEARANCEUR HAZY (A) 10/01/2017 0318   LABSPEC 1.024 10/01/2017 0318   PHURINE 5.0 10/01/2017 0318   GLUCOSEU NEGATIVE 10/01/2017 0318   GLUCOSEU NEGATIVE 05/20/2017 1705   HGBUR SMALL (A) 10/01/2017 0318   BILIRUBINUR NEGATIVE 10/01/2017 0318   KETONESUR NEGATIVE 10/01/2017 0318   PROTEINUR 30 (A) 10/01/2017 0318   UROBILINOGEN 0.2 05/20/2017 1705   NITRITE NEGATIVE 10/01/2017 0318   LEUKOCYTESUR MODERATE (A) 10/01/2017 0318   Sepsis Labs: @LABRCNTIP (procalcitonin:4,lacticidven:4)  )No results found for this or any previous visit (from the past 240 hour(s)).       Radiology Studies: Dg Chest 2 View  Result Date: 10/24/2017 CLINICAL DATA:  Shortness of  breath EXAM: CHEST  2 VIEW COMPARISON:  10/02/2017 FINDINGS: AP and lateral views of the chest show hyperexpansion. The cardio pericardial silhouette is enlarged. Soft tissue opacity in the right hilum similar to prior and compatible with vascularity as seen on CT of 01/16/2017. There is pulmonary vascular congestion without overt pulmonary edema. Tiny pleural effusion. IMPRESSION: Cardiomegaly with vascular congestion and tiny right effusion. Electronically Signed   By: Misty Stanley M.D.   On: 10/24/2017 18:34   Ct Angio Chest Pe W Or Wo Contrast  Result Date: 10/24/2017 CLINICAL DATA:  72 year old female with shortness of breath. History of PE. EXAM: CT ANGIOGRAPHY CHEST WITH CONTRAST TECHNIQUE: Multidetector CT imaging of the chest was performed using the standard protocol during bolus administration of intravenous contrast. Multiplanar CT image reconstructions and MIPs were obtained to evaluate the vascular anatomy. CONTRAST:  <See Chart> ISOVUE-370 IOPAMIDOL (ISOVUE-370) INJECTION 76% COMPARISON:  Chest radiograph dated 10/24/2017 FINDINGS: Evaluation is limited due to streak artifact caused by patient's arms. Cardiovascular: There is moderate  cardiomegaly. No pericardial effusion. There is mild atherosclerotic calcification of the thoracic aorta. No aneurysmal dilatation or evidence of dissection. There is dilatation of the pulmonary trunk and central pulmonary arteries suggestive of underlying pulmonary hypertension. There is no CT evidence of pulmonary embolism. Mediastinum/Nodes: No hilar or mediastinal adenopathy. The esophagus and the thyroid gland are grossly unremarkable. No mediastinal fluid collection. Lungs/Pleura: There is a small right pleural effusion and associated partial compressive atelectasis of the right lower lobe. There are bibasilar linear atelectasis/scarring. Diffuse interstitial and vascular prominence, likely vascular congestion and edema. Pneumonia is not excluded. Clinical  correlation is recommended. There is no pneumothorax. The central airways are patent. Upper Abdomen: Left renal parenchymal and collecting system calculi. Musculoskeletal: No chest wall abnormality. No acute or significant osseous findings. Review of the MIP images confirms the above findings. IMPRESSION: 1. No CT evidence of pulmonary embolism. 2. Cardiomegaly with evidence of pulmonary hypertension, vascular congestion and edema. 3. Small right pleural effusion. Electronically Signed   By: Anner Crete M.D.   On: 10/24/2017 22:24        Scheduled Meds: . apixaban  5 mg Oral BID  . carvedilol  3.125 mg Oral BID WC  . furosemide  40 mg Intravenous Q12H  . gabapentin  1,200 mg Oral QHS  . gabapentin  400 mg Oral Q lunch  . gabapentin  800 mg Oral Q supper  . methylPREDNISolone (SOLU-MEDROL) injection  40 mg Intravenous Q8H  . sodium chloride flush  3 mL Intravenous Q12H  . umeclidinium-vilanterol  1 puff Inhalation Daily   Continuous Infusions: . sodium chloride       LOS: 2 days    Time spent: 30 minutes    Daryus Sowash,LAWAL, MD [facility] Pager [phone] If 7PM-7AM, please contact night-coverage [email or web address] Password [email or web address] 10/26/2017, 12:12 PM

## 2017-10-27 LAB — BASIC METABOLIC PANEL
Anion gap: 10 (ref 5–15)
BUN: 27 mg/dL — ABNORMAL HIGH (ref 6–20)
CHLORIDE: 92 mmol/L — AB (ref 101–111)
CO2: 35 mmol/L — AB (ref 22–32)
Calcium: 9.5 mg/dL (ref 8.9–10.3)
Creatinine, Ser: 1.03 mg/dL — ABNORMAL HIGH (ref 0.44–1.00)
GFR calc non Af Amer: 54 mL/min — ABNORMAL LOW (ref >60)
Glucose, Bld: 224 mg/dL — ABNORMAL HIGH (ref 65–99)
POTASSIUM: 4.5 mmol/L (ref 3.5–5.1)
Sodium: 137 mmol/L (ref 135–145)

## 2017-10-27 MED ORDER — SUMATRIPTAN SUCCINATE 100 MG PO TABS
100.0000 mg | ORAL_TABLET | Freq: Once | ORAL | Status: AC
  Administered 2017-10-27: 100 mg via ORAL
  Filled 2017-10-27 (×2): qty 1

## 2017-10-27 NOTE — Progress Notes (Signed)
PT Cancellation Note  Patient Details Name: Janice Morrow MRN: [id number] DOB: [birth year]   Cancelled Treatment:    Reason Eval/Treat Not Completed: Pain limiting ability to participate: pt c/o severe right-sided headache and declined mobility this date.  Will re-attempt mobility evaluation next date.   [facility] 10/27/2017, 3:09 PM

## 2017-10-27 NOTE — Progress Notes (Signed)
Patient ID: Janice Morrow, female   DOB: 07-24-1946, 72 y.o.   MRN: [id number]  PROGRESS NOTE    Janice Morrow  RFF:[id number] DOB: [birth year] DOA: 10/24/2017 PCP: Janith Lima, MD   Outpatient Specialists: Glenetta Hew, Cardiology  Brief Narrative: This is 72 year old female with history of COPD as well as CHF who was admitted with acute on chronic steroid failure with significant hypoxemia. Patient was found to have pulmonary edema. She also was suspected to have PE but CT angiogram of the chest was negative. She is being treated for COPD as well as CHF exacerbation. Patient was requiring up to 8 L of oxygen at the time of admission. She is down to 4 L at the moment which is her home dose.She has Migraine Headaches.  Assessment & Plan:   Principal Problem:   Acute on chronic respiratory failure with hypoxia (HCC) Active Problems:   COPD with acute exacerbation (HCC)   Chronic back pain   OSA on CPAP   Essential hypertension   Obesity hypoventilation syndrome (HCC)   History of pulmonary embolism   Acute on chronic combined systolic and diastolic CHF (congestive heart failure) (HCC)   #1 acute on chronic respiratory failure with hypoxemia: No respiratory distress. Patient much better today. Still has some shortness of breath and cough. Patient down to 4 L of oxygen this morning we'll continue titrated down to her home dose. Continue diuresis with COPD treatment. If she improves could be discharged home tomorrow  #2 obstructive sleep apnea: Patient has been refusing to use has CPAP at night here. Counseling provided regarding the use of CPAP.  #3 history of pulmonary embolism: Patient has not been taking her medications accurately at home. With mid adjustment and she is on full anticoagulation now  #4 hypertension: Blood pressure appears well controlled. Continue current regimen  #5 morbid obesity: Counseling provided. Continue close monitoring  #6 left hand pain: X-ray of the  hand shows no bony involvement.  #7 migraine headaches: Patient will be tried all sumatriptan. She usually uses Neurontin at home whenever she has a flareup. This does not symptom to be helping here.   DVT prophylaxis: Eliquis  Code Status: Full  Family Communication: Discussed with Patient. No family available  Disposition Plan: Home with Home health  Consultants:   None  Procedures: CT angiogram of the chest showing no evidence of pulmonary embolism  Antimicrobials: None  Subjective: Patient is having headaches. She has history of Migraine headaches. Her breathing is much better.  Objective: Vitals:   10/27/17 0945 10/27/17 1100 10/27/17 1400 10/27/17 2013  BP: (!) 118/59  (!) 160/83 (!) 151/79  Pulse: 81 79 72 78  Resp:   18 (!) 22  Temp:   98.4 F (36.9 C) 98 F (36.7 C)  TempSrc:   Oral Oral  SpO2:  90% 93% 94%  Weight:      Height:        Intake/Output Summary (Last 24 hours) at 10/27/2017 2037 Last data filed at 10/27/2017 1700 Gross per 24 hour  Intake 1080 ml  Output 2800 ml  Net -1720 ml   Filed Weights   10/25/17 0504 10/26/17 0410 10/27/17 0522  Weight: (!) 156.7 kg (345 lb 7.4 oz) (!) 156.5 kg (345 lb) (!) 152.2 kg (335 lb 9.6 oz)    Examination:  General exam: Appears calm and comfortable  Respiratory system: Decreased air entry with some mild basal crackles. Respiratory effort normal. Cardiovascular system: S1 & S2 heard, RRR.  No JVD, murmurs, rubs, gallops or clicks. No pedal edema. Gastrointestinal system: Abdomen is nondistended, soft and nontender. No organomegaly or masses felt. Normal bowel sounds heard. Central nervous system: Alert and oriented. No focal neurological deficits. Extremities: Symmetric 5 x 5 power. Skin: No rashes, lesions or ulcers Psychiatry: Judgement and insight appear normal. Mood & affect appropriate.      Data Reviewed: I have personally reviewed following labs and imaging studies  CBC: Recent Labs  Lab  10/24/17 1754  WBC 8.5  HGB 12.9  HCT 44.1  MCV 93.4  PLT 324   Basic Metabolic Panel: Recent Labs  Lab 10/24/17 1754 10/25/17 0334 10/26/17 0230 10/27/17 0856  NA 141 141 139 137  K 3.9 4.3 4.7 4.5  CL 101 100* 95* 92*  CO2 30 32 35* 35*  GLUCOSE 128* 184* 155* 224*  BUN 11 11 17  27*  CREATININE 0.95 0.91 0.91 1.03*  CALCIUM 8.8* 8.8* 9.2 9.5   GFR: Estimated Creatinine Clearance: 77.4 mL/min (A) (by C-G formula based on SCr of 1.03 mg/dL (H)). Liver Function Tests: No results for input(s): AST, ALT, ALKPHOS, BILITOT, PROT, ALBUMIN in the last 168 hours. No results for input(s): LIPASE, AMYLASE in the last 168 hours. No results for input(s): AMMONIA in the last 168 hours. Coagulation Profile: No results for input(s): INR, PROTIME in the last 168 hours. Cardiac Enzymes: No results for input(s): CKTOTAL, CKMB, CKMBINDEX, TROPONINI in the last 168 hours. BNP (last 3 results) No results for input(s): PROBNP in the last 8760 hours. HbA1C: No results for input(s): HGBA1C in the last 72 hours. CBG: Recent Labs  Lab 10/25/17 1203 10/25/17 1634  GLUCAP 234* 100*   Lipid Profile: No results for input(s): CHOL, HDL, LDLCALC, TRIG, CHOLHDL, LDLDIRECT in the last 72 hours. Thyroid Function Tests: No results for input(s): TSH, T4TOTAL, FREET4, T3FREE, THYROIDAB in the last 72 hours. Anemia Panel: No results for input(s): VITAMINB12, FOLATE, FERRITIN, TIBC, IRON, RETICCTPCT in the last 72 hours. Urine analysis:    Component Value Date/Time   COLORURINE AMBER (A) 10/01/2017 0318   APPEARANCEUR HAZY (A) 10/01/2017 0318   LABSPEC 1.024 10/01/2017 0318   PHURINE 5.0 10/01/2017 0318   GLUCOSEU NEGATIVE 10/01/2017 0318   GLUCOSEU NEGATIVE 05/20/2017 1705   HGBUR SMALL (A) 10/01/2017 0318   BILIRUBINUR NEGATIVE 10/01/2017 0318   KETONESUR NEGATIVE 10/01/2017 0318   PROTEINUR 30 (A) 10/01/2017 0318   UROBILINOGEN 0.2 05/20/2017 1705   NITRITE NEGATIVE 10/01/2017 0318    LEUKOCYTESUR MODERATE (A) 10/01/2017 0318   Sepsis Labs: @LABRCNTIP (procalcitonin:4,lacticidven:4)  )No results found for this or any previous visit (from the past 240 hour(s)).       Radiology Studies: Dg Hand Complete Left  Result Date: 10/26/2017 CLINICAL DATA:  Pain and swelling for 2 days EXAM: LEFT HAND - COMPLETE 3+ VIEW COMPARISON:  None. FINDINGS: There is no evidence of fracture or dislocation. There is no evidence of arthropathy or other focal bone abnormality. Soft tissues are unremarkable. IMPRESSION: No acute osseous injury of the left hand. Electronically Signed   By: Kathreen Devoid   On: 10/26/2017 15:24        Scheduled Meds: . apixaban  5 mg Oral BID  . carvedilol  3.125 mg Oral BID WC  . furosemide  40 mg Intravenous Q12H  . gabapentin  1,200 mg Oral QHS  . gabapentin  400 mg Oral Q lunch  . gabapentin  800 mg Oral Q supper  . methylPREDNISolone (SOLU-MEDROL) injection  40  mg Intravenous Q8H  . sodium chloride flush  3 mL Intravenous Q12H  . umeclidinium-vilanterol  1 puff Inhalation Daily   Continuous Infusions: . sodium chloride       LOS: 3 days    Time spent: 30 minutes    Kataleya Zaugg,LAWAL, MD [facility] Pager [phone] If 7PM-7AM, please contact night-coverage [email or web address] Password [facility] 10/27/2017, 8:37 PM

## 2017-10-28 ENCOUNTER — Other Ambulatory Visit: Payer: Self-pay

## 2017-10-28 ENCOUNTER — Inpatient Hospital Stay (HOSPITAL_COMMUNITY): Payer: Medicare Other

## 2017-10-28 DIAGNOSIS — I509 Heart failure, unspecified: Secondary | ICD-10-CM

## 2017-10-28 DIAGNOSIS — R0902 Hypoxemia: Secondary | ICD-10-CM

## 2017-10-28 LAB — BASIC METABOLIC PANEL
ANION GAP: 12 (ref 5–15)
BUN: 29 mg/dL — ABNORMAL HIGH (ref 6–20)
CHLORIDE: 92 mmol/L — AB (ref 101–111)
CO2: 34 mmol/L — ABNORMAL HIGH (ref 22–32)
Calcium: 9.5 mg/dL (ref 8.9–10.3)
Creatinine, Ser: 0.99 mg/dL (ref 0.44–1.00)
GFR calc Af Amer: >60 mL/min (ref >60)
GFR, EST NON AFRICAN AMERICAN: 57 mL/min — AB (ref >60)
Glucose, Bld: 158 mg/dL — ABNORMAL HIGH (ref 65–99)
POTASSIUM: 4.7 mmol/L (ref 3.5–5.1)
SODIUM: 138 mmol/L (ref 135–145)

## 2017-10-28 NOTE — Evaluation (Addendum)
Occupational Therapy Evaluation Patient Details Name: Janice Morrow MRN: [id number] DOB: Nov 16, 1945 Today's Date: 10/28/2017    History of Present Illness Janice Morrow is a 72 y.o. female with medical history significant for COPD with chronic hypoxic respiratory failure, chronic combined CHF, chronic back pain, history of PE on Eliquis, and recent NSTEMI, presenting to the emergency department for evaluation of worsening dyspnea, productive cough, wheezing, and swelling and tenderness in the right lower extremity.   Clinical Impression   PTA, pt was living with her brother and independent with assistive devices for basic ADL. She limits her housework and functional mobility due to increased fatigue and shortness of breath. Pt currently requires min guard assist for toilet transfers and LB ADL. She reports decreased functional R sided vision with blurred and "watery" vision associated with R sided head pain. SpO2 desaturation on 4L O2 to 87% and improved to 91% with breathing techniques encouraged. She would benefit from continued OT services while admitted as well as home health OT follow-up. However, pt declines therapy follow-up post-acute D/C. OT will continue to follow while admitted.     Follow Up Recommendations  Home health OT    Equipment Recommendations  3 in 1 bedside commode    Recommendations for Other Services       Precautions / Restrictions Precautions Precautions: Fall Restrictions Weight Bearing Restrictions: No      Mobility Bed Mobility Overal bed mobility: Needs Assistance Bed Mobility: Supine to Sit;Sit to Supine     Supine to sit: Supervision Sit to supine: Min guard   General bed mobility comments: Guarding assist to bring BLE onto bed although pt asking for help. She did not need lift assist just close hand onguarding.   Transfers Overall transfer level: Needs assistance Equipment used: 4-wheeled walker Transfers: Sit to/from Stand Sit to Stand:  Supervision Stand pivot transfers: Supervision       General transfer comment: Cues for hand placement safety.     Balance Overall balance assessment: Needs assistance Sitting-balance support: Feet supported;Feet unsupported Sitting balance-Leahy Scale: Good Sitting balance - Comments: moves dynamically with good balance   Standing balance support: Bilateral upper extremity supported;No upper extremity supported Standing balance-Leahy Scale: Fair Standing balance comment: Can stand staticallly without AD but requires UE support for dynamic tasks                            ADL either performed or assessed with clinical judgement   ADL Overall ADL's : Needs assistance/impaired Eating/Feeding: Sitting;Modified independent   Grooming: Set up;Sitting   Upper Body Bathing: Set up;Sitting   Lower Body Bathing: Min guard;Sit to/from stand   Upper Body Dressing : Set up;Sitting   Lower Body Dressing: Min guard;Sit to/from stand   Toilet Transfer: Min guard;Ambulation;RW Toilet Transfer Details (indicate cue type and reason): Very close hands on guarding.  Toileting- Water quality scientist and Hygiene: Min guard;Sit to/from stand       Functional mobility during ADLs: Min guard;Rolling walker General ADL Comments: Pt with significantly limited activity tolerance for ADL participation. Dyspnea 2/4 with minimal activities today. Agitated with OT asking her to complete functional tasks.      Vision Patient Visual Report: Blurring of vision Vision Assessment?: Yes Eye Alignment: Within Functional Limits Ocular Range of Motion: Within Functional Limits Alignment/Gaze Preference: Within Defined Limits Tracking/Visual Pursuits: Other (comment)(limited due to pain with this activity) Visual Fields: Other (comment)(unable to test due to pain) Diplopia Assessment: (  none present today) Additional Comments: Pt reports difficulty with R sided vision notably blurry and "watery"  today. She says this is associated with her pain.      Perception     Praxis      Pertinent Vitals/Pain Pain Assessment: Faces Faces Pain Scale: Hurts little more Pain Location: R frontal are of head; pt reports "it's not a headache, it's a pain"     Hand Dominance     Extremity/Trunk Assessment Upper Extremity Assessment Upper Extremity Assessment: Generalized weakness   Lower Extremity Assessment Lower Extremity Assessment: Generalized weakness       Communication Communication Communication: No difficulties   Cognition Arousal/Alertness: Awake/alert Behavior During Therapy: WFL for tasks assessed/performed Overall Cognitive Status: Within Functional Limits for tasks assessed                                     General Comments       Exercises     Shoulder Instructions      Home Living Family/patient expects to be discharged to:: Private residence Living Arrangements: Other relatives Available Help at Discharge: Family;Available 24 hours/day Type of Home: Apartment Home Access: Level entry     Home Layout: One level     Bathroom Shower/Tub: Teacher, early years/pre: Standard     Home Equipment: Cane - single point          Prior Functioning/Environment Level of Independence: Independent with assistive device(s)        Comments: Gets down into tub rather than showers and not open to changing this. Stays in her boarding room mostly and not very active. Brother lives with her and does all grocery shopping. Per PT, brother smokes cigars in their room.         OT Problem List: Decreased strength;Decreased range of motion;Decreased activity tolerance;Impaired balance (sitting and/or standing);Decreased safety awareness;Decreased knowledge of use of DME or AE;Decreased knowledge of precautions;Pain      OT Treatment/Interventions: Therapeutic exercise;Self-care/ADL training;DME and/or AE instruction;Therapeutic  activities;Visual/perceptual remediation/compensation;Patient/family education;Balance training    OT Goals(Current goals can be found in the care plan section) Acute Rehab OT Goals Patient Stated Goal: get home OT Goal Formulation: With patient Time For Goal Achievement: 11/11/17 Potential to Achieve Goals: Good ADL Goals Pt Will Perform Grooming: with modified independence;standing Pt Will Perform Lower Body Dressing: with modified independence;sit to/from stand Pt Will Transfer to Toilet: with modified independence;ambulating;regular height toilet Pt Will Perform Toileting - Clothing Manipulation and hygiene: with modified independence;sit to/from stand Pt Will Perform Tub/Shower Transfer: Tub transfer;with modified independence;rolling walker;ambulating Additional ADL Goal #1: Pt will verbalize 2 strategies to compensate for decreased functional use of vision during daily routine.  OT Frequency: Min 2X/week   Barriers to D/C:            Co-evaluation              AM-PAC PT "6 Clicks" Daily Activity     Outcome Measure Help from another person eating meals?: None Help from another person taking care of personal grooming?: A Little Help from another person toileting, which includes using toliet, bedpan, or urinal?: A Little Help from another person bathing (including washing, rinsing, drying)?: A Little Help from another person to put on and taking off regular upper body clothing?: A Little Help from another person to put on and taking off regular lower body clothing?: A Little 6  Click Score: 19   End of Session Equipment Utilized During Treatment: Rolling walker;Oxygen(Rollator; 4L O2) Nurse Communication: Mobility status  Activity Tolerance: Patient tolerated treatment well Patient left: in bed;with call bell/phone within reach  OT Visit Diagnosis: Pain;Muscle weakness (generalized) (M62.81);Low vision, both eyes (H54.2) Pain - Right/Left: Right Pain - part of body:  (head)                Time: 0903-0149 OT Time Calculation (min): 18 min Charges:  OT General Charges $OT Visit: 1 Visit OT Evaluation $OT Eval Moderate Complexity: 1 Mod G-Codes:     Norman Herrlich, MS OTR/L  Pager: [place] Ti[place] Bartoli 10/28/2017, 3:41 PM

## 2017-10-28 NOTE — Care Management Important Message (Signed)
Important Message  Patient Details  Name: Janice Morrow MRN: [id number] Date of Birth: April 20, 1946   Medicare Important Message Given:  Yes    Orbie Pyo 10/28/2017, 12:14 PM

## 2017-10-28 NOTE — Progress Notes (Signed)
Attempted to call pts brother about regarding the name of the company that supplies pt's O2. Pt brother did not answer. Left message and no return call received. Pt will need portable O2 in order to d/c tomorrow.   Janice Morrow E

## 2017-10-28 NOTE — Evaluation (Signed)
Physical Therapy Evaluation Patient Details Name: NEWELL FRATER MRN: [id number] DOB: May 17, 1946 Today's Date: 10/28/2017   History of Present Illness  MARSI TURVEY is a 72 y.o. female with medical history significant for COPD with chronic hypoxic respiratory failure, chronic combined CHF, chronic back pain, history of PE on Eliquis, and recent NSTEMI, presenting to the emergency department for evaluation of worsening dyspnea, productive cough, wheezing, and swelling and tenderness in the right lower extremity.  Clinical Impression  Pt is close to baseline functioning and should be safe at home with brother's assist.  Pt could use HHPT to improve safety, independence and ability to get out of the room more often.. There are no further acute PT needs.  Will sign off at this time.     Follow Up Recommendations Home health PT;Other (comment)(but pt refuses)    Equipment Recommendations  Other (comment)(Bariatric rollator)    Recommendations for Other Services       Precautions / Restrictions Precautions Precautions: Fall      Mobility  Bed Mobility Overal bed mobility: Needs Assistance Bed Mobility: Supine to Sit;Sit to Supine     Supine to sit: Supervision Sit to supine: Supervision   General bed mobility comments: struggles to get feet over the [facility] into the bed, but moves her mass surprisingly well.  Transfers Overall transfer level: Needs assistance   Transfers: Sit to/from Stand;Stand Pivot Transfers Sit to Stand: Supervision Stand pivot transfers: Supervision       General transfer comment: cues for hand placement safety  Ambulation/Gait Ambulation/Gait assistance: Supervision Ambulation Distance (Feet): 20 Feet(x2) Assistive device: Rolling walker (2 wheeled) Gait Pattern/deviations: Step-through pattern   Gait velocity interpretation: Below normal speed for age/gender General Gait Details: Could use the RW more safely, but in general pt is steady for short  distances.  pt has to use her O2 at all times, Even short distance walking dropped sats into the low 80's  Stairs            Wheelchair Mobility    Modified Rankin (Stroke Patients Only)       Balance Overall balance assessment: Needs assistance Sitting-balance support: Feet supported;Feet unsupported Sitting balance-Leahy Scale: Good Sitting balance - Comments: moves dynamically with good balance   Standing balance support: Bilateral upper extremity supported;No upper extremity supported Standing balance-Leahy Scale: Fair Standing balance comment: can stand staticallly without AD                             Pertinent Vitals/Pain Pain Assessment: No/denies pain    Home Living Family/patient expects to be discharged to:: Private residence Living Arrangements: Other relatives Available Help at Discharge: Family;Available 24 hours/day Type of Home: Apartment Home Access: Level entry     Home Layout: One level Home Equipment: Cane - single point      Prior Function Level of Independence: Independent with assistive device(s)         Comments: pt reports she gets down into tub, doesn't really leave her boarding room at all, brother does the grocery shopping and smokes cigars in the room     Hand Dominance        Extremity/Trunk Assessment                Communication   Communication: No difficulties  Cognition Arousal/Alertness: Awake/alert Behavior During Therapy: WFL for tasks assessed/performed Overall Cognitive Status: Within Functional Limits for tasks assessed  General Comments      Exercises     Assessment/Plan    PT Assessment    PT Problem List         PT Treatment Interventions      PT Goals (Current goals can be found in the Care Plan section)  Acute Rehab PT Goals Patient Stated Goal: get home PT Goal Formulation: All assessment and education complete, DC  therapy    Frequency     Barriers to discharge        Co-evaluation               AM-PAC PT "6 Clicks" Daily Activity  Outcome Measure Difficulty turning over in bed (including adjusting bedclothes, sheets and blankets)?: None Difficulty moving from lying on back to sitting on the side of the bed? : None Difficulty sitting down on and standing up from a chair with arms (e.g., wheelchair, bedside commode, etc,.)?: None Help needed moving to and from a bed to chair (including a wheelchair)?: A Little Help needed walking in hospital room?: A Little Help needed climbing 3-5 steps with a railing? : A Lot 6 Click Score: 20    End of Session   Activity Tolerance: Patient tolerated treatment well Patient left: in bed;with call bell/phone within reach Nurse Communication: Mobility status PT Visit Diagnosis: Other abnormalities of gait and mobility (R26.89);Difficulty in walking, not elsewhere classified (R26.2)    Time: 6295-2841 PT Time Calculation (min) (ACUTE ONLY): 20 min   Charges:   PT Evaluation $PT Eval Moderate Complexity: 1 Mod     PT G Codes:        11-09-17  Donnella Sham, PT [phone]  (pager)  Tessie Fass Kashif Pooler 09-Nov-2017, 12:32 PM

## 2017-10-28 NOTE — Progress Notes (Signed)
Patient ID: Janice Morrow, female   DOB: [birth year], 72 y.o.   MRN: [id number]  PROGRESS NOTE    DERIONA ALTEMOSE  GBT:[id number] DOB: 1946/01/29 DOA: 10/24/2017 PCP: Janith Lima, MD   Outpatient Specialists: Glenetta Hew, Cardiology  Brief Narrative: This is 72 year old female with history of COPD as well as CHF who was admitted with acute on chronic steroid failure with significant hypoxemia. Patient was found to have pulmonary edema. She also was suspected to have PE but CT angiogram of the chest was negative. She is being treated for COPD as well as CHF exacerbation. Patient was requiring up to 8 L of oxygen at the time of admission. She is down to 4 L at the moment which is her home dose.She has Migraine Headaches.  Assessment & Plan:   Principal Problem:   Acute on chronic respiratory failure with hypoxia (HCC) Active Problems:   COPD with acute exacerbation (HCC)   Chronic back pain   OSA on CPAP   Essential hypertension   Obesity hypoventilation syndrome (HCC)   History of pulmonary embolism   Acute on chronic combined systolic and diastolic CHF (congestive heart failure) (HCC)   #1 acute on chronic respiratory failure with hypoxemia: No respiratory distress. Patient much better today. Still has some shortness of breath and cough. Patient down to 4 L of oxygen this morning we'll continue titrated down to her home dose. Continue diuresis with COPD treatment. If she improves could be discharged home tomorrow  #2 obstructive sleep apnea: Patient has been refusing to use has CPAP at night here. Counseling provided regarding the use of CPAP.  #3 history of pulmonary embolism: Patient has not been taking her medications accurately at home. With mid adjustment and she is on full anticoagulation now  #4 hypertension: Blood pressure appears well controlled. Continue current regimen  #5 morbid obesity: Counseling provided. Continue close monitoring  #6 left hand pain: X-ray of the  hand shows no bony involvement.  #7 migraine headaches: Patient will be tried all sumatriptan. She usually uses Neurontin at home whenever she has a flareup. This does not symptom to be helping here.   DVT prophylaxis: Eliquis  Code Status: Full  Family Communication: Discussed with Patient. No family available  Disposition Plan: Home with Home health  Consultants:   None  Procedures: CT angiogram of the chest showing no evidence of pulmonary embolism  Antimicrobials: None  Subjective: Patient is having headaches. She has history of Migraine headaches. Her breathing is much better.  Objective: Vitals:   10/27/17 1400 10/27/17 2013 10/28/17 0335 10/28/17 1400  BP: (!) 160/83 (!) 151/79 (!) 152/81 126/77  Pulse: 72 78 66 73  Resp: 18 (!) 22 20 20   Temp: 98.4 F (36.9 C) 98 F (36.7 C) 98.4 F (36.9 C) 98.6 F (37 C)  TempSrc: Oral Oral Oral Oral  SpO2: 93% 94% 93% 95%  Weight:   (!) 153.4 kg (338 lb 1.6 oz)   Height:        Intake/Output Summary (Last 24 hours) at 10/28/2017 1719 Last data filed at 10/28/2017 1500 Gross per 24 hour  Intake 958 ml  Output 1400 ml  Net -442 ml   Filed Weights   10/26/17 0410 10/27/17 0522 10/28/17 0335  Weight: (!) 156.5 kg (345 lb) (!) 152.2 kg (335 lb 9.6 oz) (!) 153.4 kg (338 lb 1.6 oz)    Examination:  General exam: Appears calm and comfortable  Respiratory system: Decreased air entry with some mild  basal crackles. Respiratory effort normal. Cardiovascular system: S1 & S2 heard, RRR. No JVD, murmurs, rubs, gallops or clicks. No pedal edema. Gastrointestinal system: Abdomen is nondistended, soft and nontender. No organomegaly or masses felt. Normal bowel sounds heard. Central nervous system: Alert and oriented. No focal neurological deficits. Extremities: Symmetric 5 x 5 power. Skin: No rashes, lesions or ulcers Psychiatry: Judgement and insight appear normal. Mood & affect appropriate.      Data Reviewed: I have  personally reviewed following labs and imaging studies  CBC: Recent Labs  Lab 10/24/17 1754  WBC 8.5  HGB 12.9  HCT 44.1  MCV 93.4  PLT 761   Basic Metabolic Panel: Recent Labs  Lab 10/24/17 1754 10/25/17 0334 10/26/17 0230 10/27/17 0856 10/28/17 0331  NA 141 141 139 137 138  K 3.9 4.3 4.7 4.5 4.7  CL 101 100* 95* 92* 92*  CO2 30 32 35* 35* 34*  GLUCOSE 128* 184* 155* 224* 158*  BUN 11 11 17  27* 29*  CREATININE 0.95 0.91 0.91 1.03* 0.99  CALCIUM 8.8* 8.8* 9.2 9.5 9.5   GFR: Estimated Creatinine Clearance: 80.9 mL/min (by C-G formula based on SCr of 0.99 mg/dL). Liver Function Tests: No results for input(s): AST, ALT, ALKPHOS, BILITOT, PROT, ALBUMIN in the last 168 hours. No results for input(s): LIPASE, AMYLASE in the last 168 hours. No results for input(s): AMMONIA in the last 168 hours. Coagulation Profile: No results for input(s): INR, PROTIME in the last 168 hours. Cardiac Enzymes: No results for input(s): CKTOTAL, CKMB, CKMBINDEX, TROPONINI in the last 168 hours. BNP (last 3 results) No results for input(s): PROBNP in the last 8760 hours. HbA1C: No results for input(s): HGBA1C in the last 72 hours. CBG: Recent Labs  Lab 10/25/17 1203 10/25/17 1634  GLUCAP 234* 100*   Lipid Profile: No results for input(s): CHOL, HDL, LDLCALC, TRIG, CHOLHDL, LDLDIRECT in the last 72 hours. Thyroid Function Tests: No results for input(s): TSH, T4TOTAL, FREET4, T3FREE, THYROIDAB in the last 72 hours. Anemia Panel: No results for input(s): VITAMINB12, FOLATE, FERRITIN, TIBC, IRON, RETICCTPCT in the last 72 hours. Urine analysis:    Component Value Date/Time   COLORURINE AMBER (A) 10/01/2017 0318   APPEARANCEUR HAZY (A) 10/01/2017 0318   LABSPEC 1.024 10/01/2017 0318   PHURINE 5.0 10/01/2017 0318   GLUCOSEU NEGATIVE 10/01/2017 0318   GLUCOSEU NEGATIVE 05/20/2017 1705   HGBUR SMALL (A) 10/01/2017 0318   BILIRUBINUR NEGATIVE 10/01/2017 0318   KETONESUR NEGATIVE  10/01/2017 0318   PROTEINUR 30 (A) 10/01/2017 0318   UROBILINOGEN 0.2 05/20/2017 1705   NITRITE NEGATIVE 10/01/2017 0318   LEUKOCYTESUR MODERATE (A) 10/01/2017 0318   Sepsis Labs: @LABRCNTIP (procalcitonin:4,lacticidven:4)  )No results found for this or any previous visit (from the past 240 hour(s)).       Radiology Studies: No results found.      Scheduled Meds: . apixaban  5 mg Oral BID  . carvedilol  3.125 mg Oral BID WC  . furosemide  40 mg Intravenous Q12H  . gabapentin  1,200 mg Oral QHS  . gabapentin  400 mg Oral Q lunch  . gabapentin  800 mg Oral Q supper  . methylPREDNISolone (SOLU-MEDROL) injection  40 mg Intravenous Q8H  . sodium chloride flush  3 mL Intravenous Q12H  . umeclidinium-vilanterol  1 puff Inhalation Daily   Continuous Infusions: . sodium chloride       LOS: 4 days    Time spent: 30 minutes    GARBA,LAWAL, MD [facility] Pager  [phone] If 7PM-7AM, please contact night-coverage [email or web address] Password [facility] 10/28/2017, 5:19 PM

## 2017-10-28 NOTE — Progress Notes (Signed)
PT REFUSES CPAP FOR THE NIGHT.

## 2017-10-29 LAB — BASIC METABOLIC PANEL
Anion gap: 10 (ref 5–15)
BUN: 35 mg/dL — AB (ref 6–20)
CHLORIDE: 92 mmol/L — AB (ref 101–111)
CO2: 37 mmol/L — ABNORMAL HIGH (ref 22–32)
Calcium: 9.7 mg/dL (ref 8.9–10.3)
Creatinine, Ser: 1.14 mg/dL — ABNORMAL HIGH (ref 0.44–1.00)
GFR calc Af Amer: 56 mL/min — ABNORMAL LOW (ref >60)
GFR calc non Af Amer: 48 mL/min — ABNORMAL LOW (ref >60)
GLUCOSE: 148 mg/dL — AB (ref 65–99)
POTASSIUM: 4.7 mmol/L (ref 3.5–5.1)
Sodium: 139 mmol/L (ref 135–145)

## 2017-10-29 MED ORDER — PREDNISONE 10 MG PO TABS: ORAL_TABLET | ORAL | 0 refills | Status: DC

## 2017-10-29 NOTE — Discharge Summary (Signed)
Physician Discharge Summary  Janice Morrow VOJ:[id number] DOB: May 22, 1946 DOA: 10/24/2017  PCP: Janice Lima, MD  Admit date: 10/24/2017 Discharge date: 10/29/2017  Time spent: 37 minutes minutes  Recommendations for Outpatient Follow-up:  1. Follow his primary care physician  2. You will need follow-up with neurology regarding headache and C1-C2 inflammation  3. Continue nebulizers and antibiotics was prednisone taper  Discharge Diagnoses:  Principal Problem:   Acute on chronic respiratory failure with hypoxia (HCC) Active Problems:   COPD with acute exacerbation (HCC)   Chronic back pain   OSA on CPAP   Essential hypertension   Obesity hypoventilation syndrome (HCC)   History of pulmonary embolism   Acute on chronic combined systolic and diastolic CHF (congestive heart failure) ([place])   Discharge Condition: Fair  Diet recommendation: Heart healthy  Filed Weights   10/27/17 0522 10/28/17 0335 10/29/17 0500  Weight: (!) 152.2 kg (335 lb 9.6 oz) (!) 153.4 kg (338 lb 1.6 oz) (!) 153.2 kg (337 lb 11.9 oz)    History of present illness:  Janice Morrow is a 72 y.o. female with medical history significant for COPD with chronic hypoxic respiratory failure, chronic combined CHF, chronic back pain, history of PE on Eliquis, and recent NSTEMI, presenting to the emergency department for evaluation of worsening dyspnea, productive cough, wheezing, and swelling and tenderness in the right lower extremity.  She was discharged from the hospital 3 weeks ago after medical management of non-STEMI and acute on chronic combined CHF.  She remained dyspneic at time of discharge, but was much improved and stable.  She was saturating in the low 60s when she saw her PCP for hospital follow-up 10 days ago, had her home FiO2 increased, and improved slightly with that for a while.  She now reports continued progression in her dyspnea but denies chest pain or fevers.  She reports increased swelling in her  lower extremities, particularly the right leg which she states is also tender.  She takes Eliquis, but has been using 10 mg once daily instead of the prescribed 5 mg twice dail    Hospital Course:  Patient was admitted with acute on chronic respiratory failure. She was prior to 8 L of oxygen at the time of admission. She was evaluated for CHF versus COPD. Chest x-ray and subsequent imaging showed no evidence of fluid overload. It appears she was having more COPD exacerbation. She was treated with her home Lasix IV as well as steroids and nebulizer. Oral antibiotics added. Patient gradually was titrated down to home oxygen at 4 L/m. Her main complaint at the end of the day including pain in her left hand with swelling. X-ray shows no evidence of bony involvement. She also subsequently had headaches which have been chronic but became debilitated in the hospital. Mainly right-sided. MRI of the brain showed no intracranial abnormalities but she has C1-C2 edema with inflammation suspected to be due to arthritis and degenerative disc disease. This could be the cause of her chronic headaches. Discussed with neurology and recommendation is outpatient follow-up with neurology and possibly orthopedic surgery. She should use nonsteroidal anti-inflammatory agents which patient cannot tolerate. Patient is being discharged on oral prednisone dose taper with antibiotics and to resume nebulizer.  Procedures: MRI of the brain and x-ray of the left hand  Consultations:  None  Discharge Exam: Vitals:   10/29/17 0815 10/29/17 0935  BP:  140/84  Pulse:  78  Resp:    Temp:    SpO2: 92%  General: Morbidly obese no acute distress Cardiovascular: Regular rate and rhythm,  Respiratory: Fair air entry bilaterally. Mild basal crackles no wheezes or rales Abdomen: Obese soft nontender with positive bowel sounds Neurological: Nonfocal findings  Discharge Instructions    Allergies as of 10/29/2017       Reactions   Baclofen Other (See Comments)   Causes seizures   Naproxen    Palpitations       Medication List    TAKE these medications   apixaban 5 MG Tabs tablet Commonly known as:  ELIQUIS Take 1 tablet (5 mg total) by mouth 2 (two) times daily. What changed:    how much to take  when to take this   carvedilol 3.125 MG tablet Commonly known as:  COREG TAKE 1 TABLET BY MOUTH 2 TIMES DAILY WITH A MEAL   diclofenac sodium 1 % Gel Commonly known as:  VOLTAREN Apply 2 g topically 2 (two) times daily as needed (knee pain).   furosemide 40 MG tablet Commonly known as:  LASIX Take 1 tablet (40 mg total) by mouth daily.   gabapentin 400 MG capsule Commonly known as:  NEURONTIN Take 400-1,200 mg by mouth See admin instructions. TAKE 1 CAPSULE AT LUNCH, TAKE 2 CAPSULES AT SUPPER AND 3 CAPSULES AT BEDTIME   HYDROcodone-acetaminophen 10-325 MG tablet Commonly known as:  NORCO Take 1 tablet by mouth every 6 (six) hours as needed. What changed:  reasons to take this   OXYGEN Inhale 4 L into the lungs.   predniSONE 10 MG tablet Commonly known as:  DELTASONE 40 mg daily x 3 days then 20 mg daily x 3 days then 10 mg daily x 3 days   tiZANidine 2 MG tablet Commonly known as:  ZANAFLEX Take 1 tablet (2 mg total) by mouth every 8 (eight) hours as needed. For migraine   traMADol 50 MG tablet Commonly known as:  ULTRAM Take 1 tablet (50 mg total) by mouth every 6 (six) hours as needed.   umeclidinium-vilanterol 62.5-25 MCG/INH Aepb Commonly known as:  ANORO ELLIPTA Inhale 1 puff into the lungs daily.            Durable Medical Equipment  (From admission, onward)        Start     Ordered   10/28/17 1032  For home use only DME 4 wheeled rolling walker with seat  Once    Question:  Patient needs a walker to treat with the following condition  Answer:  Weakness   10/28/17 1032     Allergies  Allergen Reactions  . Baclofen Other (See Comments)    Causes seizures   . Naproxen     Palpitations       The results of significant diagnostics from this hospitalization (including imaging, microbiology, ancillary and laboratory) are listed below for reference.    Significant Diagnostic Studies: Dg Chest 2 View  Result Date: 10/24/2017 CLINICAL DATA:  Shortness of breath EXAM: CHEST  2 VIEW COMPARISON:  10/02/2017 FINDINGS: AP and lateral views of the chest show hyperexpansion. The cardio pericardial silhouette is enlarged. Soft tissue opacity in the right hilum similar to prior and compatible with vascularity as seen on CT of 01/16/2017. There is pulmonary vascular congestion without overt pulmonary edema. Tiny pleural effusion. IMPRESSION: Cardiomegaly with vascular congestion and tiny right effusion. Electronically Signed   By: Misty Stanley M.D.   On: 10/24/2017 18:34   Dg Chest 2 View  Result Date: 09/30/2017 CLINICAL DATA:  Hypoxemia, 86%  oxygen saturation on room air, COPD, chest pain, weakness, dizziness EXAM: CHEST  2 VIEW COMPARISON:  02/19/2017 FINDINGS: Normal heart size, mediastinal contours, and pulmonary vascularity. Minimal atelectasis at RIGHT. Lungs emphysematous but otherwise grossly clear. Atherosclerotic calcifications at aortic arch. No pleural effusion or pneumothorax. IMPRESSION: Emphysematous changes with minimal RIGHT basilar atelectasis. Electronically Signed   By: Lavonia Dana M.D.   On: 09/30/2017 18:19   Ct Angio Chest Pe W Or Wo Contrast  Result Date: 10/24/2017 CLINICAL DATA:  72 year old female with shortness of breath. History of PE. EXAM: CT ANGIOGRAPHY CHEST WITH CONTRAST TECHNIQUE: Multidetector CT imaging of the chest was performed using the standard protocol during bolus administration of intravenous contrast. Multiplanar CT image reconstructions and MIPs were obtained to evaluate the vascular anatomy. CONTRAST:  <See Chart> ISOVUE-370 IOPAMIDOL (ISOVUE-370) INJECTION 76% COMPARISON:  Chest radiograph dated 10/24/2017 FINDINGS:  Evaluation is limited due to streak artifact caused by patient's arms. Cardiovascular: There is moderate cardiomegaly. No pericardial effusion. There is mild atherosclerotic calcification of the thoracic aorta. No aneurysmal dilatation or evidence of dissection. There is dilatation of the pulmonary trunk and central pulmonary arteries suggestive of underlying pulmonary hypertension. There is no CT evidence of pulmonary embolism. Mediastinum/Nodes: No hilar or mediastinal adenopathy. The esophagus and the thyroid gland are grossly unremarkable. No mediastinal fluid collection. Lungs/Pleura: There is a small right pleural effusion and associated partial compressive atelectasis of the right lower lobe. There are bibasilar linear atelectasis/scarring. Diffuse interstitial and vascular prominence, likely vascular congestion and edema. Pneumonia is not excluded. Clinical correlation is recommended. There is no pneumothorax. The central airways are patent. Upper Abdomen: Left renal parenchymal and collecting system calculi. Musculoskeletal: No chest wall abnormality. No acute or significant osseous findings. Review of the MIP images confirms the above findings. IMPRESSION: 1. No CT evidence of pulmonary embolism. 2. Cardiomegaly with evidence of pulmonary hypertension, vascular congestion and edema. 3. Small right pleural effusion. Electronically Signed   By: Anner Crete M.D.   On: 10/24/2017 22:24   Mr Brain Wo Contrast  Result Date: 10/28/2017 CLINICAL DATA:  Severe headaches. History of migraines, respiratory failure, COPD, seizures, trigeminal neuralgia, hypertension, hyperlipidemia. EXAM: MRI HEAD WITHOUT CONTRAST TECHNIQUE: Multiplanar, multiecho pulse sequences of the brain and surrounding structures were obtained without intravenous contrast. COMPARISON:  CT HEAD November 20, 2014 and MRI of the head September 11, 2010 FINDINGS: INTRACRANIAL CONTENTS: No reduced diffusion to suggest acute ischemia or status  epilepticus. No susceptibility artifact to suggest recent hemorrhage. Chronic RIGHT frontal microhemorrhage. The ventricles and sulci are normal for patient's age. Patchy to confluent supratentorial white matter FLAIR T2 hyperintensities progressed from prior MRI. No suspicious parenchymal signal, masses, mass effect. No abnormal extra-axial fluid collections. No extra-axial masses. VASCULAR: Normal major intracranial vascular flow voids present at skull base. SKULL AND UPPER CERVICAL SPINE: No abnormal sellar expansion. No suspicious calvarial bone marrow signal. Craniocervical junction maintained. RIGHT C1-2 effusion, likely inflammatory. SINUSES/ORBITS: The mastoid air-cells and included paranasal sinuses are well-aerated.The included ocular globes and orbital contents are non-suspicious. OTHER: Patient is edentulous. IMPRESSION: 1. No acute intracranial process. 2. Progressed mild-to-moderate chronic small vessel ischemic disease. 3. RIGHT C1-2 effusion, likely inflammatory and could result in cervicogenic headaches. Electronically Signed   By: Elon Alas M.D.   On: 10/28/2017 17:37   Dg Chest Portable 1 View  Result Date: 10/02/2017 CLINICAL DATA:  Cough EXAM: PORTABLE CHEST 1 VIEW COMPARISON:  Chest x-ray of 09/30/2017 FINDINGS: Somewhat prominent hila are stable most consistent with  a degree of pulmonary arterial hypertension. No pneumonia or effusion is seen. Mild cardiomegaly is stable. No bony abnormality is seen. IMPRESSION: Stable chest x-ray with mild cardiomegaly. No definite active process. Electronically Signed   By: Ivar Drape M.D.   On: 10/02/2017 09:45   Dg Knee Complete 4 Views Right  Result Date: 09/30/2017 CLINICAL DATA:  Right knee pain EXAM: RIGHT KNEE - COMPLETE 4+ VIEW COMPARISON:  11/16/2016 FINDINGS: No fracture or malalignment. Marked arthritis involving the medial and patellofemoral compartments with narrow and degenerative spurring. Mild degenerative changes of the  lateral compartment with bony spurring present. Trace suprapatellar effusion. IMPRESSION: 1. No acute osseous abnormality 2. Tricompartment arthritis most marked involving the medial and patellofemoral compartments. Trace effusion. Electronically Signed   By: Donavan Foil M.D.   On: 09/30/2017 18:15   Dg Hand Complete Left  Result Date: 10/26/2017 CLINICAL DATA:  Pain and swelling for 2 days EXAM: LEFT HAND - COMPLETE 3+ VIEW COMPARISON:  None. FINDINGS: There is no evidence of fracture or dislocation. There is no evidence of arthropathy or other focal bone abnormality. Soft tissues are unremarkable. IMPRESSION: No acute osseous injury of the left hand. Electronically Signed   By: Kathreen Devoid   On: 10/26/2017 15:24    Microbiology: No results found for this or any previous visit (from the past 240 hour(s)).   Labs: Basic Metabolic Panel: Recent Labs  Lab 10/25/17 0334 10/26/17 0230 10/27/17 0856 10/28/17 0331 10/29/17 0355  NA 141 139 137 138 139  K 4.3 4.7 4.5 4.7 4.7  CL 100* 95* 92* 92* 92*  CO2 32 35* 35* 34* 37*  GLUCOSE 184* 155* 224* 158* 148*  BUN 11 17 27* 29* 35*  CREATININE 0.91 0.91 1.03* 0.99 1.14*  CALCIUM 8.8* 9.2 9.5 9.5 9.7   Liver Function Tests: No results for input(s): AST, ALT, ALKPHOS, BILITOT, PROT, ALBUMIN in the last 168 hours. No results for input(s): LIPASE, AMYLASE in the last 168 hours. No results for input(s): AMMONIA in the last 168 hours. CBC: Recent Labs  Lab 10/24/17 1754  WBC 8.5  HGB 12.9  HCT 44.1  MCV 93.4  PLT 178   Cardiac Enzymes: No results for input(s): CKTOTAL, CKMB, CKMBINDEX, TROPONINI in the last 168 hours. BNP: BNP (last 3 results) Recent Labs    11/30/16 2009 01/16/17 1823 10/24/17 1754  BNP 127.6* 47.3 183.1*    ProBNP (last 3 results) No results for input(s): PROBNP in the last 8760 hours.  CBG: Recent Labs  Lab 10/25/17 1203 10/25/17 1634  GLUCAP 234* 100*       SignedBarbette Merino MD.  [facility] 10/29/2017, 1:08 PM

## 2017-10-29 NOTE — Plan of Care (Signed)
  Nutrition: Adequate nutrition will be maintained 10/29/2017 0105 - Completed/Met by Claudine Mouton, RN Patient continues to consume 100% of meals and snacks.

## 2017-10-29 NOTE — Progress Notes (Signed)
The patient has been given discharge instructions along will a new medication list and what to take today. She has a prescription to pick up. She also has follow up appointments and Hassan Rowan with [facility] will follow up with transportation for out patient appointments. The patient is discharging via cab.   Saddie Benders RN

## 2017-10-30 ENCOUNTER — Telehealth: Payer: Self-pay | Admitting: *Deleted

## 2017-10-30 ENCOUNTER — Ambulatory Visit: Payer: Medicare Other | Admitting: Adult Health

## 2017-10-30 NOTE — Telephone Encounter (Signed)
Tried calling pt/son Janice Morrow) to set-up [employer] f/u appt no answer LMOM RTC to schedule appt.Marland KitchenJohny Chess

## 2017-10-30 NOTE — Progress Notes (Deleted)
Cardiology Office Note   Date:  10/30/2017   ID:  Janice Morrow, DOB [birth year], MRN [id number]  PCP:  Janith Lima, MD  Cardiologist: Lubertha South  No chief complaint on file.    History of Present Illness: Janice Morrow is a 72 y.o. female who presents for posthospitalization follow-up after admission for NSTEMI, and acute on chronic systolic and diastolic CHF.  She has a known history of CHF, hypertension, COPD on home oxygen at 2 L, pulmonary emboli on Eliquis, OSA on CPAP, morbid obesity.  During hospitalization, the patient's troponin became elevated at 1.51-1.53.  It was felt to be related to respiratory deficiency.  However, with cardiac risk factors she was planned for cardiac catheterization.  Catheterization revealed ostial circumflex to proximal circumflex lesion of 30%, otherwise minimal coronary artery disease with a large wraparound LAD.  She was found to have decreased LV systolic function of 40% to 45% by visual estimate.  Right heart cath could not be completed in the setting of worsening respiratory status.  The patient was given IV diuresis in the setting of decompensated systolic heart failure.  The patient diuresed 2 L with a DC weight of 340 pounds.  She was continued on home carvedilol and p.o. Lasix.  The patient has chronic kidney disease stage III now with a creatinine of 1.20 prior to cath, but improved to 0.87 post cath after diuresis.  She was discharged home on 02 at 3 L with a goal of O2 sat greater than 90% to 92%.  She was to be seen by outpatient pulmonology post discharge.    Past Medical History:  Diagnosis Date  . Acute diastolic CHF (congestive heart failure) ([place]) 08/24/2015  . Cervicalgia   . Chronic back pain   . COPD (chronic obstructive pulmonary disease) ([place])   . Essential hypertension 08/24/2015  . GERD (gastroesophageal reflux disease)   . Headache(784.0)   . Heart murmur   . History of kidney stones   . Hyperlipidemia   . Migraine  without aura, with intractable migraine, so stated, without mention of status migrainosus   . OSA on CPAP    patient denies sleep apnea uses CPAP to get oxygent o brain  . Osteoarthritis   . Pulmonary embolism ([place])   . PVD (peripheral vascular disease) ([place])   . PVD (peripheral vascular disease) ([place])   . Seizures ([place]) 07/2014, 10/2014   due to baclofen  . Shortness of breath dyspnea   . Trigeminal neuralgia     Past Surgical History:  Procedure Laterality Date  . LEFT HEART CATH AND CORONARY ANGIOGRAPHY N/A 10/01/2017   Procedure: LEFT HEART CATH AND CORONARY ANGIOGRAPHY;  Surgeon: Leonie Man, MD;  Location: [facility];  Service: Cardiovascular;  Laterality: N/A;  . MULTIPLE TOOTH EXTRACTIONS     "they took out part of my teeth; I took out the rest when they got loose; was suppose to get dentures; never did"  . NO PAST SURGERIES    . URETEROSCOPY WITH HOLMIUM LASER LITHOTRIPSY Left 07/09/2017   Procedure: URETEROSCOPY WITH HOLMIUM LASER LITHOTRIPSY/ STENT;  Surgeon: Festus Aloe, MD;  Location: [facility];  Service: Urology;  Laterality: Left;  . URETEROSCOPY WITH HOLMIUM LASER LITHOTRIPSY Left 07/23/2017   Procedure: URETEROSCOPY WITH HOLMIUM LASER LITHOTRIPSY /STENT;  Surgeon: Festus Aloe, MD;  Location: [facility];  Service: Urology;  Laterality: Left;  NEEDS 60 MINUTES FOR PROCEDURE     Current Outpatient Medications  Medication Sig Dispense Refill  .  apixaban (ELIQUIS) 5 MG TABS tablet Take 1 tablet (5 mg total) by mouth 2 (two) times daily. (Patient taking differently: Take 10 mg by mouth daily. ) 60 tablet 5  . carvedilol (COREG) 3.125 MG tablet TAKE 1 TABLET BY MOUTH 2 TIMES DAILY WITH A MEAL 180 tablet 1  . diclofenac sodium (VOLTAREN) 1 % GEL Apply 2 g topically 2 (two) times daily as needed (knee pain).    . furosemide (LASIX) 40 MG tablet Take 1 tablet (40 mg total) by mouth daily. 90 tablet 1  . gabapentin (NEURONTIN) 400 MG capsule Take 400-1,200 mg by  mouth See admin instructions. TAKE 1 CAPSULE AT LUNCH, TAKE 2 CAPSULES AT SUPPER AND 3 CAPSULES AT BEDTIME     . HYDROcodone-acetaminophen (NORCO) 10-325 MG tablet Take 1 tablet by mouth every 6 (six) hours as needed. (Patient taking differently: Take 1 tablet by mouth every 6 (six) hours as needed for moderate pain. ) 90 tablet 0  . OXYGEN Inhale 4 L into the lungs.    . predniSONE (DELTASONE) 10 MG tablet 40 mg daily x 3 days then 20 mg daily x 3 days then 10 mg daily x 3 days 21 tablet 0  . tiZANidine (ZANAFLEX) 2 MG tablet Take 1 tablet (2 mg total) by mouth every 8 (eight) hours as needed. For migraine 90 tablet 3  . traMADol (ULTRAM) 50 MG tablet Take 1 tablet (50 mg total) by mouth every 6 (six) hours as needed. (Patient not taking: Reported on 10/24/2017) 20 tablet 0  . umeclidinium-vilanterol (ANORO ELLIPTA) 62.5-25 MCG/INH AEPB Inhale 1 puff into the lungs daily. 60 each 7   No current facility-administered medications for this visit.     Allergies:   Baclofen and Naproxen    Social History:  The patient  reports that she quit smoking about 7 years ago. Her smoking use included cigarettes. she has never used smokeless tobacco. She reports that she does not drink alcohol or use drugs.   Family History:  The patient's family history includes Dementia in her mother; Diabetes in her brother, mother, and unknown relative; Heart attack in her father; Heart disease in her unknown relative.    ROS: All other systems are reviewed and negative. Unless otherwise mentioned in H&P    PHYSICAL EXAM: VS:  LMP 08/27/1993 (Approximate)  , BMI There is no height or weight on file to calculate BMI. GEN: Well nourished, well developed, in no acute distress  HEENT: normal  Neck: no JVD, carotid bruits, or masses Cardiac: ***RRR; no murmurs, rubs, or gallops,no edema  Respiratory:  clear to auscultation bilaterally, normal work of breathing GI: soft, nontender, nondistended, + BS MS: no deformity or  atrophy  Skin: warm and dry, no rash Neuro:  Strength and sensation are intact Psych: euthymic mood, full affect   EKG:  EKG {ACTION; IS/IS [id number]} ordered today. The ekg ordered today demonstrates ***   Recent Labs: 12/03/2016: Magnesium 2.3 01/30/2017: ALT 16; TSH 2.65 10/24/2017: B Natriuretic Peptide 183.1; Hemoglobin 12.9; Platelets 178 10/29/2017: BUN 35; Creatinine, Ser 1.14; Potassium 4.7; Sodium 139    Lipid Panel    Component Value Date/Time   CHOL 179 01/30/2017 1629   TRIG 128.0 01/30/2017 1629   HDL 59.90 01/30/2017 1629   CHOLHDL 3 01/30/2017 1629   VLDL 25.6 01/30/2017 1629   LDLCALC 93 01/30/2017 1629      Wt Readings from Last 3 Encounters:  10/29/17 (!) 337 lb 11.9 oz (153.2 kg)  10/03/17 [name])  340 lb 6.2 oz (154.4 kg)  07/23/17 (!) 350 lb (158.8 kg)      Other studies Reviewed: CARDIAC CATH: 10/01/2017  There is mild to moderate left ventricular systolic dysfunction.  LV end diastolic pressure is severely elevated. EDP 25-30 mmHg  The left ventricular ejection fraction is 40-45% by visual estimate. Global hypokinesis  There is trace-mild (2+) mitral regurgitation.  Ost Cx to Prox Cx lesion is 30% stenosed. Otherwise minimal CAD with a large wraparound LAD. Angiographically minimal CAD with right dominant system., But large wraparound LAD. Moderate to severely elevated LVEDP. Suspect troponin elevation was related to microvascular ischemia in the setting of acute (on chronic) combined systolic and diastolic heart failure. Unable to obtain venous access for right heart cath due to patient discomfort and respiratory distress, requiring nonrebreather oxygen I did not feel it was safe to keep her on the table to attempt further venous access after several attempts were made using ultrasound.    ASSESSMENT AND PLAN:  1.  ***   Current medicines are reviewed at length with the patient today.    Labs/ tests ordered today include: *** Phill Myron.  [place] Pugh, ANP, AACC   10/30/2017 7:52 AM    [facility] [address] [place], [place] [address] Phone: [phone]; Fax: [phone]

## 2017-10-31 NOTE — Telephone Encounter (Signed)
Called pt/(Janice Morrow) still no answer LMOM pt need to make hosp f/u appt w/dr. Ronnald Ramp.(sent CRM to [facility] for FYI)...Janice Morrow

## 2017-11-01 NOTE — Telephone Encounter (Signed)
Left several msg's for pt to make hosp f/u appt w/PCP. Pt still has not returned call or made appt. Per chart will be seeing cardiology on 11/18/17. Closing encounter.Marland KitchenJohny Chess

## 2017-11-04 DIAGNOSIS — Z79891 Long term (current) use of opiate analgesic: Secondary | ICD-10-CM | POA: Diagnosis not present

## 2017-11-04 DIAGNOSIS — M47817 Spondylosis without myelopathy or radiculopathy, lumbosacral region: Secondary | ICD-10-CM | POA: Diagnosis not present

## 2017-11-04 DIAGNOSIS — Z79899 Other long term (current) drug therapy: Secondary | ICD-10-CM | POA: Diagnosis not present

## 2017-11-04 DIAGNOSIS — M25569 Pain in unspecified knee: Secondary | ICD-10-CM | POA: Diagnosis not present

## 2017-11-04 DIAGNOSIS — M5137 Other intervertebral disc degeneration, lumbosacral region: Secondary | ICD-10-CM | POA: Diagnosis not present

## 2017-11-04 DIAGNOSIS — G894 Chronic pain syndrome: Secondary | ICD-10-CM | POA: Diagnosis not present

## 2017-11-13 NOTE — Progress Notes (Deleted)
Cardiology Office Note   Date:  11/13/2017   ID:  Janice Morrow, DOB [birth year], MRN [id number]  PCP:  Janice Lima, MD  Cardiologist: Dr. Stanford Breed No chief complaint on file.    History of Present Illness: Janice Morrow is a 72 y.o. female who presents for posthospitalization follow-up in the setting of a non-ST elevation MI with acute on chronic systolic and diastolic heart failure.   The patient ultimately scheduled for cardiac catheterization completed on 10/01/2017.  Patient had angiographically minimal CAD with right dominant system.  A large wraparound LAD moderate to severely elevated LVEDP.  Objective data elevated troponin was related to microvascular ischemia setting of mixed CHF.  Patient was unable to have right heart catheter due to inability to obtain venous access.  Patient was treated with IV diuresis, and diuresed greater than 2 L.  Charge weight was 340 pounds.  Due to history of PE, the patient was continued on home Eliquis.    Past Medical History:  Diagnosis Date  . Acute diastolic CHF (congestive heart failure) ([place]) 08/24/2015  . Cervicalgia   . Chronic back pain   . COPD (chronic obstructive pulmonary disease) ([place])   . Essential hypertension 08/24/2015  . GERD (gastroesophageal reflux disease)   . Headache(784.0)   . Heart murmur   . History of kidney stones   . Hyperlipidemia   . Migraine without aura, with intractable migraine, so stated, without mention of status migrainosus   . OSA on CPAP    patient denies sleep apnea uses CPAP to get oxygent o brain  . Osteoarthritis   . Pulmonary embolism ([place])   . PVD (peripheral vascular disease) ([place])   . PVD (peripheral vascular disease) ([place])   . Seizures ([place]) 07/2014, 10/2014   due to baclofen  . Shortness of breath dyspnea   . Trigeminal neuralgia     Past Surgical History:  Procedure Laterality Date  . LEFT HEART CATH AND CORONARY ANGIOGRAPHY N/A 10/01/2017   Procedure: LEFT HEART CATH AND  CORONARY ANGIOGRAPHY;  Surgeon: Leonie Man, MD;  Location: [facility];  Service: Cardiovascular;  Laterality: N/A;  . MULTIPLE TOOTH EXTRACTIONS     "they took out part of my teeth; I took out the rest when they got loose; was suppose to get dentures; never did"  . NO PAST SURGERIES    . URETEROSCOPY WITH HOLMIUM LASER LITHOTRIPSY Left 07/09/2017   Procedure: URETEROSCOPY WITH HOLMIUM LASER LITHOTRIPSY/ STENT;  Surgeon: Festus Aloe, MD;  Location: [facility];  Service: Urology;  Laterality: Left;  . URETEROSCOPY WITH HOLMIUM LASER LITHOTRIPSY Left 07/23/2017   Procedure: URETEROSCOPY WITH HOLMIUM LASER LITHOTRIPSY /STENT;  Surgeon: Festus Aloe, MD;  Location: [facility];  Service: Urology;  Laterality: Left;  NEEDS 60 MINUTES FOR PROCEDURE     Current Outpatient Medications  Medication Sig Dispense Refill  . apixaban (ELIQUIS) 5 MG TABS tablet Take 1 tablet (5 mg total) by mouth 2 (two) times daily. (Patient taking differently: Take 10 mg by mouth daily. ) 60 tablet 5  . carvedilol (COREG) 3.125 MG tablet TAKE 1 TABLET BY MOUTH 2 TIMES DAILY WITH A MEAL 180 tablet 1  . diclofenac sodium (VOLTAREN) 1 % GEL Apply 2 g topically 2 (two) times daily as needed (knee pain).    . furosemide (LASIX) 40 MG tablet Take 1 tablet (40 mg total) by mouth daily. 90 tablet 1  . gabapentin (NEURONTIN) 400 MG capsule Take 400-1,200 mg by mouth See admin  instructions. TAKE 1 CAPSULE AT LUNCH, TAKE 2 CAPSULES AT SUPPER AND 3 CAPSULES AT BEDTIME     . HYDROcodone-acetaminophen (NORCO) 10-325 MG tablet Take 1 tablet by mouth every 6 (six) hours as needed. (Patient taking differently: Take 1 tablet by mouth every 6 (six) hours as needed for moderate pain. ) 90 tablet 0  . OXYGEN Inhale 4 L into the lungs.    . predniSONE (DELTASONE) 10 MG tablet 40 mg daily x 3 days then 20 mg daily x 3 days then 10 mg daily x 3 days 21 tablet 0  . tiZANidine (ZANAFLEX) 2 MG tablet Take 1 tablet (2 mg total) by mouth  every 8 (eight) hours as needed. For migraine 90 tablet 3  . traMADol (ULTRAM) 50 MG tablet Take 1 tablet (50 mg total) by mouth every 6 (six) hours as needed. (Patient not taking: Reported on 10/24/2017) 20 tablet 0  . umeclidinium-vilanterol (ANORO ELLIPTA) 62.5-25 MCG/INH AEPB Inhale 1 puff into the lungs daily. 60 each 7   No current facility-administered medications for this visit.     Allergies:   Baclofen and Naproxen    Social History:  The patient  reports that she quit smoking about 7 years ago. Her smoking use included cigarettes. she has never used smokeless tobacco. She reports that she does not drink alcohol or use drugs.   Family History:  The patient's family history includes Dementia in her mother; Diabetes in her brother, mother, and unknown relative; Heart attack in her father; Heart disease in her unknown relative.    ROS: All other systems are reviewed and negative. Unless otherwise mentioned in H&P    PHYSICAL EXAM: VS:  LMP 08/27/1993 (Approximate)  , BMI There is no height or weight on file to calculate BMI. GEN: Well nourished, well developed, in no acute distress  HEENT: normal  Neck: no JVD, carotid bruits, or masses Cardiac: ***RRR; no murmurs, rubs, or gallops,no edema  Respiratory:  clear to auscultation bilaterally, normal work of breathing GI: soft, nontender, nondistended, + BS MS: no deformity or atrophy  Skin: warm and dry, no rash Neuro:  Strength and sensation are intact Psych: euthymic mood, full affect   EKG:  EKG {ACTION; IS/IS [id number]} ordered today. The ekg ordered today demonstrates ***   Recent Labs: 12/03/2016: Magnesium 2.3 01/30/2017: ALT 16; TSH 2.65 10/24/2017: B Natriuretic Peptide 183.1; Hemoglobin 12.9; Platelets 178 10/29/2017: BUN 35; Creatinine, Ser 1.14; Potassium 4.7; Sodium 139    Lipid Panel    Component Value Date/Time   CHOL 179 01/30/2017 1629   TRIG 128.0 01/30/2017 1629   HDL 59.90 01/30/2017 1629   CHOLHDL  3 01/30/2017 1629   VLDL 25.6 01/30/2017 1629   LDLCALC 93 01/30/2017 1629      Wt Readings from Last 3 Encounters:  10/29/17 (!) 337 lb 11.9 oz (153.2 kg)  10/03/17 (!) 340 lb 6.2 oz (154.4 kg)  07/23/17 (!) 350 lb (158.8 kg)      Other studies Reviewed: Additional studies/ records that were reviewed today include: ***. Review of the above records demonstrates: ***   ASSESSMENT AND PLAN:  1.  ***   Current medicines are reviewed at length with the patient today.    Labs/ tests ordered today include: *** Phill Myron. West Pugh, ANP, AACC   11/13/2017 1:26 PM    [facility] [address], [place], [place] [address] Phone: [phone]; Fax: [phone]

## 2017-11-18 ENCOUNTER — Ambulatory Visit: Payer: Medicare Other | Admitting: Adult Health

## 2017-11-19 ENCOUNTER — Encounter: Payer: Self-pay | Admitting: *Deleted

## 2017-11-19 ENCOUNTER — Encounter: Payer: Self-pay | Admitting: Internal Medicine

## 2017-11-20 ENCOUNTER — Encounter: Payer: Self-pay | Admitting: Adult Health

## 2017-11-20 ENCOUNTER — Ambulatory Visit (INDEPENDENT_AMBULATORY_CARE_PROVIDER_SITE_OTHER): Payer: Medicare Other | Admitting: Adult Health

## 2017-11-20 VITALS — BP 123/87 | HR 85 | Ht 65.0 in

## 2017-11-20 DIAGNOSIS — I10 Essential (primary) hypertension: Secondary | ICD-10-CM

## 2017-11-20 DIAGNOSIS — I43 Cardiomyopathy in diseases classified elsewhere: Secondary | ICD-10-CM | POA: Diagnosis not present

## 2017-11-20 DIAGNOSIS — I5042 Chronic combined systolic (congestive) and diastolic (congestive) heart failure: Secondary | ICD-10-CM

## 2017-11-20 DIAGNOSIS — R0902 Hypoxemia: Secondary | ICD-10-CM

## 2017-11-20 DIAGNOSIS — I509 Heart failure, unspecified: Secondary | ICD-10-CM

## 2017-11-20 MED ORDER — PANTOPRAZOLE SODIUM 20 MG PO TBEC: 20.0000 mg | DELAYED_RELEASE_TABLET | Freq: Every day | ORAL | 5 refills | Status: DC

## 2017-11-20 MED ORDER — ISOSORBIDE MONONITRATE ER 30 MG PO TB24: 15.0000 mg | ORAL_TABLET | Freq: Every day | ORAL | 5 refills | Status: DC

## 2017-11-20 NOTE — Patient Instructions (Signed)
Medication Instructions:  START ISOSORBIDE MONONITRATE 15MG  DAILY  START PROTONIX 20MG  DAILY  If you need a refill on your cardiac medications before your next appointment, please call your pharmacy.   Special Instructions: CALL DR SETHI AND MAKE AN APPOINTMENT  MAKE SURE TO KEEP SCHEDULED APPT WITH DR Ronnald Ramp  Follow-Up: Your physician wants you to follow-up in: [place] should receive a reminder letter in the mail two months in advance. If you do not receive a letter, please call our office [phone] to schedule the 05-2018 follow-up appointment.   Thank you for choosing [facility] at [facility]!!

## 2017-11-20 NOTE — Progress Notes (Signed)
Cardiology Office Note   Date:  11/20/2017   ID:  Janice Morrow, DOB 07/25/1946, MRN [id number]  PCP:  Janith Lima, MD  Cardiologist: Dr. Stanford Breed  No chief complaint on file.    History of Present Illness: Janice Morrow is a 72 y.o. female who presents for posthospitalization follow-up.  The patient unaware that her appointment was earlier this week.  She was seen as an add-on. The patient has a history of non-ST elevation MI, and acute on chronic combined systolic and diastolic heart failure during recent admission in February 2019.  She was readmitted on 10/24/2017 in the setting of headache, and respiratory distress.  The patient has a known history of COPD, OSA on CPAP (currently not wearing it), hypertension, history of PE, and obesity hypoventilation syndrome.  During first hospitalization in February 2019 the patient underwent cardiac catheterization, which revealed angiographically minimal CAD with right dominant system.  Moderate to severely elevated LVEDP.  Suspected that troponin elevation in the setting of non-STEMI was related to acute mixed CHF.  She was diuresed at that time with 8 liter fluid removal..   We did not see her on follow-up admission for acute respiratory failure.  She was diagnosed with COPD exacerbation treated with steroids and nebulizer treatments, and oral antibiotics were provided.  He comes today with multiple somatic complaints to include neck pain, shoulder pain, abdominal pain, GERD symptoms, substernal chest pressure radiating into her back.  She no longer wears CPAP because she states she is on O2 via nasal cannula all the time.  She has not seen her pulmonologist in greater than 4 years.  She states she is medically compliant, but during hospitalization she was found to be taking apixaban both tablets in the a.m. and none in the p.m.  I have asked her about  this and she states she is taking it as directed.  She has not followed up with your PCP Dr.  Ronnald Ramp.  Past Medical History:  Diagnosis Date  . Acute diastolic CHF (congestive heart failure) ([place]) 08/24/2015  . Cervicalgia   . Chronic back pain   . COPD (chronic obstructive pulmonary disease) ([place])   . Essential hypertension 08/24/2015  . GERD (gastroesophageal reflux disease)   . Headache(784.0)   . Heart murmur   . History of kidney stones   . Hyperlipidemia   . Migraine without aura, with intractable migraine, so stated, without mention of status migrainosus   . OSA on CPAP    patient denies sleep apnea uses CPAP to get oxygent o brain  . Osteoarthritis   . Pulmonary embolism ([place])   . PVD (peripheral vascular disease) ([place])   . PVD (peripheral vascular disease) ([place])   . Seizures ([place]) 07/2014, 10/2014   due to baclofen  . Shortness of breath dyspnea   . Trigeminal neuralgia     Past Surgical History:  Procedure Laterality Date  . LEFT HEART CATH AND CORONARY ANGIOGRAPHY N/A 10/01/2017   Procedure: LEFT HEART CATH AND CORONARY ANGIOGRAPHY;  Surgeon: Leonie Man, MD;  Location: [facility];  Service: Cardiovascular;  Laterality: N/A;  . MULTIPLE TOOTH EXTRACTIONS     "they took out part of my teeth; I took out the rest when they got loose; was suppose to get dentures; never did"  . NO PAST SURGERIES    . URETEROSCOPY WITH HOLMIUM LASER LITHOTRIPSY Left 07/09/2017   Procedure: URETEROSCOPY WITH HOLMIUM LASER LITHOTRIPSY/ STENT;  Surgeon: Festus Aloe, MD;  Location: [facility];  Service: Urology;  Laterality: Left;  . URETEROSCOPY WITH HOLMIUM LASER LITHOTRIPSY Left 07/23/2017   Procedure: URETEROSCOPY WITH HOLMIUM LASER LITHOTRIPSY /STENT;  Surgeon: Festus Aloe, MD;  [facility];  Service: Urology;  Laterality: Left;  NEEDS 60 MINUTES FOR PROCEDURE     Current Outpatient Medications  Medication Sig Dispense Refill  . apixaban (ELIQUIS) 5 MG TABS tablet Take 1 tablet (5 mg total) by mouth 2 (two) times daily. (Patient taking differently: Take  10 mg by mouth daily. ) 60 tablet 5  . carvedilol (COREG) 3.125 MG tablet TAKE 1 TABLET BY MOUTH 2 TIMES DAILY WITH A MEAL 180 tablet 1  . furosemide (LASIX) 40 MG tablet Take 1 tablet (40 mg total) by mouth daily. 90 tablet 1  . gabapentin (NEURONTIN) 400 MG capsule Take 400-1,200 mg by mouth See admin instructions. TAKE 1 CAPSULE AT LUNCH, TAKE 2 CAPSULES AT SUPPER AND 3 CAPSULES AT BEDTIME     . HYDROcodone-acetaminophen (NORCO) 10-325 MG tablet Take 1 tablet by mouth every 6 (six) hours as needed. (Patient taking differently: Take 1 tablet by mouth every 6 (six) hours as needed for moderate pain. ) 90 tablet 0  . OXYGEN Inhale 4 L into the lungs.    [name] tiZANidine (ZANAFLEX) 2 MG tablet Take 1 tablet (2 mg total) by mouth every 8 (eight) hours as needed. For migraine 90 tablet 3  . umeclidinium-vilanterol (ANORO ELLIPTA) 62.5-25 MCG/INH AEPB Inhale 1 puff into the lungs daily. 60 each 7   No current facility-administered medications for this visit.     Allergies:   Baclofen and Naproxen    Social History:  The patient  reports that she quit smoking about 7 years ago. Her smoking use included cigarettes. She has never used smokeless tobacco. She reports that she does not drink alcohol or use drugs.   Family History:  The patient's family history includes Dementia in her mother; Diabetes in her brother, mother, and unknown relative; Heart attack in her father; Heart disease in her unknown relative.    ROS: All other systems are reviewed and negative. Unless otherwise mentioned in H&P    PHYSICAL EXAM: VS:  BP 123/87 (BP Location: Right Arm, Patient Position: Sitting, Cuff Size: Large)   Pulse 85   Ht 5\' 5"  (1.651 m)   LMP 08/27/1993 (Approximate)   SpO2 95%   BMI 56.20 kg/m  , BMI Body mass index is 56.2 kg/m. GEN: Well nourished, well developed, in no acute distress wheelchair dependent HEENT: normal  Neck: no JVD, carotid bruits, or masses Cardiac: RRR; distant heart sounds, no  murmurs, rubs, or gallops,no edema  Respiratory:  Clear to auscultation bilaterally, poor inspiratory effort with normal work of breathing GI: soft, nontender, nondistended, + BS MS: no deformity or atrophy  Skin: warm and dry, no rash Neuro:  Strength and sensation are intact Psych: euthymic mood, full affect   EKG:  The EKG  from recent hospitalization, normal sinus rhythm with LVH, first-degree AV block, heart rate is 75 bpm.  Recent Labs: 12/03/2016: Magnesium 2.3 01/30/2017: ALT 16; TSH 2.65 10/24/2017: B Natriuretic Peptide 183.1; Hemoglobin 12.9; Platelets 178 10/29/2017: BUN 35; Creatinine, Ser 1.14; Potassium 4.7; Sodium 139    Lipid Panel    Component Value Date/Time   CHOL 179 01/30/2017 1629   TRIG 128.0 01/30/2017 1629   HDL 59.90 01/30/2017 1629   CHOLHDL 3 01/30/2017 1629   VLDL 25.6 01/30/2017 1629   LDLCALC 93 01/30/2017 1629  Wt Readings from Last 3 Encounters:  10/29/17 (!) 337 lb 11.9 oz (153.2 kg)  10/03/17 (!) 340 lb 6.2 oz (154.4 kg)  07/23/17 (!) 350 lb (158.8 kg)      Other studies Reviewed: Cardiac Cath 10/01/2017    There is mild to moderate left ventricular systolic dysfunction.  LV end diastolic pressure is severely elevated. EDP 25-30 mmHg  The left ventricular ejection fraction is 40-45% by visual estimate. Global hypokinesis  There is trace-mild (2+) mitral regurgitation.  Ost Cx to Prox Cx lesion is 30% stenosed. Otherwise minimal CAD with a large wraparound LAD.   Angiographically minimal CAD with right dominant system.,  But large wraparound LAD. Moderate to severely elevated LVEDP.  Suspect troponin elevation was related to microvascular ischemia in the setting of acute (on chronic) combined systolic and diastolic heart failure. Unable to obtain venous access for right heart cath due to patient discomfort and respiratory distress, requiring nonrebreather oxygen I did not feel it was safe to keep her on the table to attempt further  venous access after several attempts were made using ultrasound.    ASSESSMENT AND PLAN:  1.  Chronic chest pain: The patient may be having some esophageal spasm versus bronchial spasm in the setting of O2 dependent COPD.  Cardiac catheterization is reassuring.  Going to start her on low-dose isosorbide mononitrate, 15 mg daily.  This may help with any estimating which may occur.  2.  Chronic COPD: Frequent exacerbations, recommend that patient follow-up with pulmonologist, Dr. Leonie Man.  Being re-referred to him.  3.  Chronic mixed CHF: The patient has recently been admitted in February 2019 and diuresed 8 L.  She remains on diuretic therapy.  She is advised on a low-sodium diet.  She is continued on carvedilol 3.125 mg twice daily.  4.  History of PE: She remains on apixaban 5 mg p.o. twice daily.  Current medicines are reviewed at length with the patient today.    Labs/ tests ordered today include: none Phill Myron. [place] Pugh, ANP, AACC   11/20/2017 3:59 PM    [facility] [address] [place], [place] [address] Phone: [phone]; Fax: [phone]

## 2017-11-25 ENCOUNTER — Ambulatory Visit: Payer: Medicare Other | Admitting: Cardiology

## 2017-12-01 ENCOUNTER — Other Ambulatory Visit: Payer: Self-pay | Admitting: Internal Medicine

## 2017-12-01 DIAGNOSIS — I2699 Other pulmonary embolism without acute cor pulmonale: Secondary | ICD-10-CM

## 2017-12-24 DIAGNOSIS — M25569 Pain in unspecified knee: Secondary | ICD-10-CM | POA: Diagnosis not present

## 2017-12-24 DIAGNOSIS — Z79899 Other long term (current) drug therapy: Secondary | ICD-10-CM | POA: Diagnosis not present

## 2017-12-24 DIAGNOSIS — R51 Headache: Secondary | ICD-10-CM | POA: Diagnosis not present

## 2017-12-24 DIAGNOSIS — Z79891 Long term (current) use of opiate analgesic: Secondary | ICD-10-CM | POA: Diagnosis not present

## 2017-12-24 DIAGNOSIS — M5137 Other intervertebral disc degeneration, lumbosacral region: Secondary | ICD-10-CM | POA: Diagnosis not present

## 2017-12-24 DIAGNOSIS — G894 Chronic pain syndrome: Secondary | ICD-10-CM | POA: Diagnosis not present

## 2017-12-27 ENCOUNTER — Other Ambulatory Visit: Payer: Self-pay | Admitting: Internal Medicine

## 2017-12-27 DIAGNOSIS — I5189 Other ill-defined heart diseases: Secondary | ICD-10-CM

## 2017-12-27 DIAGNOSIS — I519 Heart disease, unspecified: Secondary | ICD-10-CM

## 2017-12-27 DIAGNOSIS — I10 Essential (primary) hypertension: Secondary | ICD-10-CM

## 2018-01-23 DIAGNOSIS — M47817 Spondylosis without myelopathy or radiculopathy, lumbosacral region: Secondary | ICD-10-CM | POA: Diagnosis not present

## 2018-01-23 DIAGNOSIS — M5137 Other intervertebral disc degeneration, lumbosacral region: Secondary | ICD-10-CM | POA: Diagnosis not present

## 2018-01-23 DIAGNOSIS — Z79899 Other long term (current) drug therapy: Secondary | ICD-10-CM | POA: Diagnosis not present

## 2018-01-23 DIAGNOSIS — G894 Chronic pain syndrome: Secondary | ICD-10-CM | POA: Diagnosis not present

## 2018-01-28 ENCOUNTER — Encounter: Payer: Self-pay | Admitting: Internal Medicine

## 2018-01-28 ENCOUNTER — Ambulatory Visit (INDEPENDENT_AMBULATORY_CARE_PROVIDER_SITE_OTHER): Payer: Medicare Other | Admitting: Internal Medicine

## 2018-01-28 VITALS — BP 128/82 | HR 76 | Temp 98.4°F | Resp 16 | Ht 65.0 in | Wt 333.0 lb

## 2018-01-28 DIAGNOSIS — M5000 Cervical disc disorder with myelopathy, unspecified cervical region: Secondary | ICD-10-CM | POA: Diagnosis not present

## 2018-01-28 DIAGNOSIS — E118 Type 2 diabetes mellitus with unspecified complications: Secondary | ICD-10-CM | POA: Diagnosis not present

## 2018-01-28 DIAGNOSIS — J410 Simple chronic bronchitis: Secondary | ICD-10-CM | POA: Diagnosis not present

## 2018-01-28 LAB — POCT GLYCOSYLATED HEMOGLOBIN (HGB A1C): Hemoglobin A1C: 5.8 % — AB (ref 4.0–5.6)

## 2018-01-28 MED ORDER — UMECLIDINIUM-VILANTEROL 62.5-25 MCG/INH IN AEPB: 1.0000 | INHALATION_SPRAY | Freq: Every day | RESPIRATORY_TRACT | 1 refills | Status: DC

## 2018-01-28 NOTE — Patient Instructions (Signed)

## 2018-01-28 NOTE — Progress Notes (Signed)
Subjective:  Patient ID: Janice Morrow, female    DOB: [birth year]  Age: 72 y.o. MRN: [id number]  CC: Hypertension; Diabetes; and Neck Pain   HPI Janice Morrow presents for f/up - She was admitted about 2 months ago for an exacerbation of CHF and COPD.  During that admission she was evaluated for neck pain that radiates to her right face and to the back of her head.  She was found to have an effusion at C1/C2.  She continues to complain of neck pain and has tingling and weakness in her right upper extremity.  She is wheelchair-bound.  She controls the pain with hydrocodone and a muscle relaxer that she tells me are prescribed by a pain clinic.  She requests for a referral and I think she wants to see a neurosurgeon to see if this needs to be surgically repaired.  Outpatient Medications Prior to Visit  Medication Sig Dispense Refill  . carvedilol (COREG) 3.125 MG tablet TAKE 1 TABLET BY MOUTH 2 TIMES DAILY WITH A MEAL 180 tablet 0  . ELIQUIS 5 MG TABS tablet TAKE 1 TABLET BY MOUTH TWICE A DAY 60 tablet 3  . furosemide (LASIX) 40 MG tablet Take 1 tablet (40 mg total) by mouth daily. 90 tablet 1  . gabapentin (NEURONTIN) 400 MG capsule Take 400-1,200 mg by mouth See admin instructions. TAKE 1 CAPSULE AT LUNCH, TAKE 2 CAPSULES AT SUPPER AND 3 CAPSULES AT BEDTIME     . HYDROcodone-acetaminophen (NORCO) 10-325 MG tablet Take 1 tablet by mouth every 6 (six) hours as needed. (Patient taking differently: Take 1 tablet by mouth every 6 (six) hours as needed for moderate pain. ) 90 tablet 0  . isosorbide mononitrate (IMDUR) 30 MG 24 hr tablet Take 0.5 tablets (15 mg total) by mouth daily. 30 tablet 5  . OXYGEN Inhale 4 L into the lungs.    . pantoprazole (PROTONIX) 20 MG tablet Take 1 tablet (20 mg total) by mouth daily. 30 tablet 5  . tiZANidine (ZANAFLEX) 2 MG tablet Take 1 tablet (2 mg total) by mouth every 8 (eight) hours as needed. For migraine 90 tablet 3  . umeclidinium-vilanterol (ANORO ELLIPTA)  62.5-25 MCG/INH AEPB Inhale 1 puff into the lungs daily. 60 each 7   No facility-administered medications prior to visit.     ROS Review of Systems  Constitutional: Negative for diaphoresis and fatigue.  HENT: Negative.  Negative for sore throat and trouble swallowing.   Eyes: Negative.  Negative for visual disturbance.  Respiratory: Positive for cough and wheezing. Negative for chest tightness, shortness of breath and stridor.        She has rare nonproductive cough and wheezing.  She tells me her symptoms are well controlled with a daily dose of Norco.  Cardiovascular: Negative for chest pain, palpitations and leg swelling.  Gastrointestinal: Negative for abdominal pain, diarrhea, nausea and vomiting.  Genitourinary: Negative.  Negative for difficulty urinating.  Musculoskeletal: Positive for gait problem and neck pain. Negative for arthralgias and back pain.  Neurological: Positive for weakness. Negative for dizziness, numbness and headaches.  Hematological: Negative for adenopathy. Does not bruise/bleed easily.  Psychiatric/Behavioral: Negative.     Objective:  BP 128/82 (BP Location: Right Arm, Patient Position: Sitting, Cuff Size: Large)   Pulse 76   Temp 98.4 F (36.9 C) (Oral)   Resp 16   Ht 5\' 5"  (1.651 m)   Wt (!) 333 lb (151 kg)   LMP 08/27/1993 (Approximate)   SpO2  92%   BMI 55.41 kg/m   BP Readings from Last 3 Encounters:  01/28/18 128/82  11/20/17 123/87  10/29/17 140/71    Wt Readings from Last 3 Encounters:  01/28/18 (!) 333 lb (151 kg)  10/29/17 (!) 337 lb 11.9 oz (153.2 kg)  10/03/17 (!) 340 lb 6.2 oz (154.4 kg)    Physical Exam  Constitutional: No distress.  HENT:  Mouth/Throat: Oropharynx is clear and moist. No oropharyngeal exudate.  Eyes: Conjunctivae are normal. No scleral icterus.  Neck: Normal range of motion. Neck supple. No JVD present. No thyromegaly present.  Cardiovascular: Normal rate, regular rhythm and normal heart sounds. Exam  reveals no gallop.  No murmur heard. Pulmonary/Chest: Effort normal and breath sounds normal. No accessory muscle usage. No respiratory distress. She has no decreased breath sounds. She has no wheezes. She has no rhonchi.  Abdominal: Soft. Bowel sounds are normal. She exhibits no mass. There is no tenderness.  Musculoskeletal: Normal range of motion. She exhibits no edema, tenderness or deformity.  Neurological: She is alert. She displays no atrophy and no tremor. No cranial nerve deficit or sensory deficit. She exhibits abnormal muscle tone. Coordination and gait abnormal.  Reflex Scores:      Tricep reflexes are 0 on the right side and 0 on the left side.      Bicep reflexes are 0 on the right side and 0 on the left side.      Brachioradialis reflexes are 0 on the right side and 0 on the left side.      Patellar reflexes are 0 on the right side and 0 on the left side.      Achilles reflexes are 0 on the right side and 0 on the left side. Mild weakness in right hand  Skin: Skin is warm and dry. She is not diaphoretic. No pallor.  Vitals reviewed.   Lab Results  Component Value Date   WBC 8.5 10/24/2017   HGB 12.9 10/24/2017   HCT 44.1 10/24/2017   PLT 178 10/24/2017   GLUCOSE 148 (H) 10/29/2017   CHOL 179 01/30/2017   TRIG 128.0 01/30/2017   HDL 59.90 01/30/2017   LDLCALC 93 01/30/2017   ALT 16 01/30/2017   AST 15 01/30/2017   NA 139 10/29/2017   K 4.7 10/29/2017   CL 92 (L) 10/29/2017   CREATININE 1.14 (H) 10/29/2017   BUN 35 (H) 10/29/2017   CO2 37 (H) 10/29/2017   TSH 2.65 01/30/2017   INR 1.18 09/30/2017   HGBA1C 5.8 (A) 01/28/2018   MICROALBUR 9.8 (H) 05/20/2017    Dg Chest 2 View  Result Date: 10/24/2017 CLINICAL DATA:  Shortness of breath EXAM: CHEST  2 VIEW COMPARISON:  10/02/2017 FINDINGS: AP and lateral views of the chest show hyperexpansion. The cardio pericardial silhouette is enlarged. Soft tissue opacity in the right hilum similar to prior and compatible  with vascularity as seen on CT of 01/16/2017. There is pulmonary vascular congestion without overt pulmonary edema. Tiny pleural effusion. IMPRESSION: Cardiomegaly with vascular congestion and tiny right effusion. Electronically Signed   By: Misty Stanley M.D.   On: 10/24/2017 18:34   Ct Angio Chest Pe W Or Wo Contrast  Result Date: 10/24/2017 CLINICAL DATA:  72 year old female with shortness of breath. History of PE. EXAM: CT ANGIOGRAPHY CHEST WITH CONTRAST TECHNIQUE: Multidetector CT imaging of the chest was performed using the standard protocol during bolus administration of intravenous contrast. Multiplanar CT image reconstructions and MIPs were obtained to evaluate  the vascular anatomy. CONTRAST:  <See Chart> ISOVUE-370 IOPAMIDOL (ISOVUE-370) INJECTION 76% COMPARISON:  Chest radiograph dated 10/24/2017 FINDINGS: Evaluation is limited due to streak artifact caused by patient's arms. Cardiovascular: There is moderate cardiomegaly. No pericardial effusion. There is mild atherosclerotic calcification of the thoracic aorta. No aneurysmal dilatation or evidence of dissection. There is dilatation of the pulmonary trunk and central pulmonary arteries suggestive of underlying pulmonary hypertension. There is no CT evidence of pulmonary embolism. Mediastinum/Nodes: No hilar or mediastinal adenopathy. The esophagus and the thyroid gland are grossly unremarkable. No mediastinal fluid collection. Lungs/Pleura: There is a small right pleural effusion and associated partial compressive atelectasis of the right lower lobe. There are bibasilar linear atelectasis/scarring. Diffuse interstitial and vascular prominence, likely vascular congestion and edema. Pneumonia is not excluded. Clinical correlation is recommended. There is no pneumothorax. The central airways are patent. Upper Abdomen: Left renal parenchymal and collecting system calculi. Musculoskeletal: No chest wall abnormality. No acute or significant osseous  findings. Review of the MIP images confirms the above findings. IMPRESSION: 1. No CT evidence of pulmonary embolism. 2. Cardiomegaly with evidence of pulmonary hypertension, vascular congestion and edema. 3. Small right pleural effusion. Electronically Signed   By: Anner Crete M.D.   On: 10/24/2017 22:24    Assessment & Plan:   Isis was seen today for hypertension, diabetes and neck pain.  Diagnoses and all orders for this visit:  Cervical disc disease with myelopathy -     Ambulatory referral to Neurosurgery  Type 2 diabetes mellitus with complication, without long-term current use of insulin (HCC)-her A1c is at 5.8%.  Her blood sugars are adequately well controlled. -     POCT HgB A1C  Simple chronic bronchitis ([place])- She is doing well on the Laba/Lama inhaler.  Will continue. -     umeclidinium-vilanterol (ANORO ELLIPTA) 62.5-25 MCG/INH AEPB; Inhale 1 puff into the lungs daily.   I am having Arnaldo Natal. [place] maintain her HYDROcodone-acetaminophen, gabapentin, furosemide, tiZANidine, OXYGEN, isosorbide mononitrate, pantoprazole, ELIQUIS, carvedilol, and umeclidinium-vilanterol.  Meds ordered this encounter  Medications  . umeclidinium-vilanterol (ANORO ELLIPTA) 62.5-25 MCG/INH AEPB    Sig: Inhale 1 puff into the lungs daily.    Dispense:  90 each    Refill:  1     Follow-up: Return in about 6 months (around 07/30/2018).  [name], MD

## 2018-01-29 NOTE — Assessment & Plan Note (Signed)
She is working on her lifestyle modifications to lose weight. 

## 2018-01-31 ENCOUNTER — Telehealth: Payer: Self-pay

## 2018-01-31 NOTE — Telephone Encounter (Signed)
[facility] sent over a request stating that pt was to receive an rx for sleep. I did not see one on her med list. Please adivse.

## 2018-02-03 ENCOUNTER — Other Ambulatory Visit: Payer: Self-pay | Admitting: Internal Medicine

## 2018-02-03 DIAGNOSIS — G8929 Other chronic pain: Principal | ICD-10-CM | POA: Insufficient documentation

## 2018-02-03 DIAGNOSIS — G4701 Insomnia due to medical condition: Secondary | ICD-10-CM

## 2018-02-03 MED ORDER — ZOLPIDEM TARTRATE 5 MG PO TABS: 5.0000 mg | ORAL_TABLET | Freq: Every evening | ORAL | 1 refills | Status: DC | PRN

## 2018-02-03 MED ORDER — ESZOPICLONE 2 MG PO TABS: 2.0000 mg | ORAL_TABLET | Freq: Every evening | ORAL | 1 refills | Status: DC | PRN

## 2018-02-04 NOTE — Telephone Encounter (Signed)
Key: [id number]

## 2018-02-05 NOTE — Telephone Encounter (Signed)
PA was approved. Can you inform pt of same?  

## 2018-02-06 NOTE — Telephone Encounter (Signed)
Left message informing patient.

## 2018-02-19 ENCOUNTER — Other Ambulatory Visit: Payer: Self-pay | Admitting: Internal Medicine

## 2018-02-19 DIAGNOSIS — G8929 Other chronic pain: Secondary | ICD-10-CM

## 2018-02-19 DIAGNOSIS — M5440 Lumbago with sciatica, unspecified side: Secondary | ICD-10-CM

## 2018-02-19 DIAGNOSIS — M170 Bilateral primary osteoarthritis of knee: Secondary | ICD-10-CM

## 2018-02-25 DIAGNOSIS — R51 Headache: Secondary | ICD-10-CM | POA: Diagnosis not present

## 2018-02-25 DIAGNOSIS — M47817 Spondylosis without myelopathy or radiculopathy, lumbosacral region: Secondary | ICD-10-CM | POA: Diagnosis not present

## 2018-02-25 DIAGNOSIS — G894 Chronic pain syndrome: Secondary | ICD-10-CM | POA: Diagnosis not present

## 2018-02-25 DIAGNOSIS — M25569 Pain in unspecified knee: Secondary | ICD-10-CM | POA: Diagnosis not present

## 2018-03-06 ENCOUNTER — Encounter: Payer: Self-pay | Admitting: Internal Medicine

## 2018-03-06 ENCOUNTER — Ambulatory Visit (INDEPENDENT_AMBULATORY_CARE_PROVIDER_SITE_OTHER): Payer: Medicare Other | Admitting: Internal Medicine

## 2018-03-06 VITALS — BP 164/90 | HR 74 | Temp 98.2°F | Resp 20 | Ht 65.0 in | Wt 337.0 lb

## 2018-03-06 DIAGNOSIS — J988 Other specified respiratory disorders: Secondary | ICD-10-CM

## 2018-03-06 DIAGNOSIS — J418 Mixed simple and mucopurulent chronic bronchitis: Secondary | ICD-10-CM

## 2018-03-06 DIAGNOSIS — R05 Cough: Secondary | ICD-10-CM | POA: Diagnosis not present

## 2018-03-06 DIAGNOSIS — R059 Cough, unspecified: Secondary | ICD-10-CM | POA: Insufficient documentation

## 2018-03-06 MED ORDER — AMOXICILLIN-POT CLAVULANATE 875-125 MG PO TABS: 1.0000 | ORAL_TABLET | Freq: Two times a day (BID) | ORAL | 0 refills | Status: AC

## 2018-03-06 NOTE — Patient Instructions (Signed)
Cough, Adult  Coughing is a reflex that clears your throat and your airways. Coughing helps to heal and protect your lungs. It is normal to cough occasionally, but a cough that happens with other symptoms or lasts a long time may be a sign of a condition that needs treatment. A cough may last only 2-3 weeks (acute), or it may last longer than 8 weeks (chronic).  What are the causes?  Coughing is commonly caused by:   Breathing in substances that irritate your lungs.   A viral or bacterial respiratory infection.   Allergies.   Asthma.   Postnasal drip.   Smoking.   Acid backing up from the stomach into the esophagus (gastroesophageal reflux).   Certain medicines.   Chronic lung problems, including COPD (or rarely, lung cancer).   Other medical conditions such as heart failure.    Follow these instructions at home:  Pay attention to any changes in your symptoms. Take these actions to help with your discomfort:   Take medicines only as told by your health care provider.  ? If you were prescribed an antibiotic medicine, take it as told by your health care provider. Do not stop taking the antibiotic even if you start to feel better.  ? Talk with your health care provider before you take a cough suppressant medicine.   Drink enough fluid to keep your urine clear or pale yellow.   If the air is dry, use a cold steam vaporizer or humidifier in your bedroom or your home to help loosen secretions.   Avoid anything that causes you to cough at work or at home.   If your cough is worse at night, try sleeping in a semi-upright position.   Avoid cigarette smoke. If you smoke, quit smoking. If you need help quitting, ask your health care provider.   Avoid caffeine.   Avoid alcohol.   Rest as needed.    Contact a health care provider if:   You have new symptoms.   You cough up pus.   Your cough does not get better after 2-3 weeks, or your cough gets worse.   You cannot control your cough with suppressant  medicines and you are losing sleep.   You develop pain that is getting worse or pain that is not controlled with pain medicines.   You have a fever.   You have unexplained weight loss.   You have night sweats.  Get help right away if:   You cough up blood.   You have difficulty breathing.   Your heartbeat is very fast.  This information is not intended to replace advice given to you by your health care provider. Make sure you discuss any questions you have with your health care provider.  Document Released: 02/09/2011 Document Revised: 01/19/2016 Document Reviewed: 10/20/2014  Elsevier Interactive Patient Education  2018 [employer]

## 2018-03-06 NOTE — Progress Notes (Signed)
Subjective:  Patient ID: Janice Morrow, female    DOB: 1945-11-13  Age: 72 y.o. MRN: [id number]  CC: Cough and Hypertension   HPI Janice Morrow presents for f/up - she complains of a 2 week hx of cough productive of thick yellow/pink phlegm.  Outpatient Medications Prior to Visit  Medication Sig Dispense Refill  . carvedilol (COREG) 3.125 MG tablet TAKE 1 TABLET BY MOUTH 2 TIMES DAILY WITH A MEAL 180 tablet 0  . ELIQUIS 5 MG TABS tablet TAKE 1 TABLET BY MOUTH TWICE A DAY 60 tablet 3  . furosemide (LASIX) 40 MG tablet Take 1 tablet (40 mg total) by mouth daily. 90 tablet 1  . gabapentin (NEURONTIN) 400 MG capsule Take 400-1,200 mg by mouth See admin instructions. TAKE 1 CAPSULE AT LUNCH, TAKE 2 CAPSULES AT SUPPER AND 3 CAPSULES AT BEDTIME     . HYDROcodone-acetaminophen (NORCO) 10-325 MG tablet Take 1 tablet by mouth every 6 (six) hours as needed. (Patient taking differently: Take 1 tablet by mouth every 6 (six) hours as needed for moderate pain. ) 90 tablet 0  . isosorbide mononitrate (IMDUR) 30 MG 24 hr tablet Take 0.5 tablets (15 mg total) by mouth daily. 30 tablet 5  . OXYGEN Inhale 4 L into the lungs.    . pantoprazole (PROTONIX) 20 MG tablet Take 1 tablet (20 mg total) by mouth daily. 30 tablet 5  . tiZANidine (ZANAFLEX) 2 MG tablet TAKE 1 TABLET (2 MG TOTAL) BY MOUTH EVERY 8 (EIGHT) HOURS AS NEEDED. FOR MIGRAINE 90 tablet 3  . umeclidinium-vilanterol (ANORO ELLIPTA) 62.5-25 MCG/INH AEPB Inhale 1 puff into the lungs daily. 90 each 1  . zolpidem (AMBIEN) 5 MG tablet Take 1 tablet (5 mg total) by mouth at bedtime as needed for sleep. 90 tablet 1   No facility-administered medications prior to visit.     ROS Review of Systems  Constitutional: Positive for chills. Negative for diaphoresis, fatigue and fever.  HENT: Negative.   Eyes: Negative for visual disturbance.  Respiratory: Positive for cough and shortness of breath. Negative for chest tightness and wheezing.     Cardiovascular: Negative for chest pain, palpitations and leg swelling.  Gastrointestinal: Negative for abdominal pain, constipation, diarrhea and nausea.  Endocrine: Negative.   Genitourinary: Negative.  Negative for difficulty urinating.  Musculoskeletal: Negative.   Skin: Negative.  Negative for color change and rash.  Allergic/Immunologic: Negative.   Neurological: Negative.   Hematological: Negative for adenopathy. Does not bruise/bleed easily.  Psychiatric/Behavioral: Negative.     Objective:  BP (!) 164/90   Pulse 74   Temp 98.2 F (36.8 C) (Oral)   Resp 20   Ht 5\' 5"  (1.651 m)   Wt (!) 337 lb (152.9 kg)   LMP 08/27/1993 (Approximate)   SpO2 93%   BMI 56.08 kg/m   BP Readings from Last 3 Encounters:  03/06/18 (!) 164/90  01/28/18 128/82  11/20/17 123/87    Wt Readings from Last 3 Encounters:  03/06/18 (!) 337 lb (152.9 kg)  01/28/18 (!) 333 lb (151 kg)  10/29/17 (!) 337 lb 11.9 oz (153.2 kg)    Physical Exam  Constitutional: She is oriented to person, place, and time.  Non-toxic appearance. She does not have a sickly appearance. She does not appear ill. No distress.  HENT:  Mouth/Throat: No oropharyngeal exudate.  Eyes: Conjunctivae are normal. No scleral icterus.  Neck: Normal range of motion. Neck supple. No tracheal deviation present.  Cardiovascular: Normal rate, regular rhythm  and normal heart sounds. Exam reveals no gallop.  No murmur heard. Pulmonary/Chest: Effort normal. No accessory muscle usage. No tachypnea. She has no decreased breath sounds. She has no wheezes. She has rhonchi in the left lower field. She has rales.  Abdominal: Soft. Bowel sounds are normal. She exhibits no mass. There is no hepatosplenomegaly. There is no tenderness. No hernia.  Musculoskeletal: Normal range of motion. She exhibits no edema, tenderness or deformity.  Lymphadenopathy:    She has no cervical adenopathy.  Neurological: She is alert and oriented to person, place,  and time.  Skin: Skin is warm and dry. She is not diaphoretic. No pallor.    Lab Results  Component Value Date   WBC 8.5 10/24/2017   HGB 12.9 10/24/2017   HCT 44.1 10/24/2017   PLT 178 10/24/2017   GLUCOSE 148 (H) 10/29/2017   CHOL 179 01/30/2017   TRIG 128.0 01/30/2017   HDL 59.90 01/30/2017   LDLCALC 93 01/30/2017   ALT 16 01/30/2017   AST 15 01/30/2017   NA 139 10/29/2017   K 4.7 10/29/2017   CL 92 (L) 10/29/2017   CREATININE 1.14 (H) 10/29/2017   BUN 35 (H) 10/29/2017   CO2 37 (H) 10/29/2017   TSH 2.65 01/30/2017   INR 1.18 09/30/2017   HGBA1C 5.8 (A) 01/28/2018   MICROALBUR 9.8 (H) 05/20/2017    Dg Chest 2 View  Result Date: 10/24/2017 CLINICAL DATA:  Shortness of breath EXAM: CHEST  2 VIEW COMPARISON:  10/02/2017 FINDINGS: AP and lateral views of the chest show hyperexpansion. The cardio pericardial silhouette is enlarged. Soft tissue opacity in the right hilum similar to prior and compatible with vascularity as seen on CT of 01/16/2017. There is pulmonary vascular congestion without overt pulmonary edema. Tiny pleural effusion. IMPRESSION: Cardiomegaly with vascular congestion and tiny right effusion. Electronically Signed   By: Misty Stanley M.D.   On: 10/24/2017 18:34   Ct Angio Chest Pe W Or Wo Contrast  Result Date: 10/24/2017 CLINICAL DATA:  72 year old female with shortness of breath. History of PE. EXAM: CT ANGIOGRAPHY CHEST WITH CONTRAST TECHNIQUE: Multidetector CT imaging of the chest was performed using the standard protocol during bolus administration of intravenous contrast. Multiplanar CT image reconstructions and MIPs were obtained to evaluate the vascular anatomy. CONTRAST:  <See Chart> ISOVUE-370 IOPAMIDOL (ISOVUE-370) INJECTION 76% COMPARISON:  Chest radiograph dated 10/24/2017 FINDINGS: Evaluation is limited due to streak artifact caused by patient's arms. Cardiovascular: There is moderate cardiomegaly. No pericardial effusion. There is mild  atherosclerotic calcification of the thoracic aorta. No aneurysmal dilatation or evidence of dissection. There is dilatation of the pulmonary trunk and central pulmonary arteries suggestive of underlying pulmonary hypertension. There is no CT evidence of pulmonary embolism. Mediastinum/Nodes: No hilar or mediastinal adenopathy. The esophagus and the thyroid gland are grossly unremarkable. No mediastinal fluid collection. Lungs/Pleura: There is a small right pleural effusion and associated partial compressive atelectasis of the right lower lobe. There are bibasilar linear atelectasis/scarring. Diffuse interstitial and vascular prominence, likely vascular congestion and edema. Pneumonia is not excluded. Clinical correlation is recommended. There is no pneumothorax. The central airways are patent. Upper Abdomen: Left renal parenchymal and collecting system calculi. Musculoskeletal: No chest wall abnormality. No acute or significant osseous findings. Review of the MIP images confirms the above findings. IMPRESSION: 1. No CT evidence of pulmonary embolism. 2. Cardiomegaly with evidence of pulmonary hypertension, vascular congestion and edema. 3. Small right pleural effusion. Electronically Signed   By: Laren Everts.D.  On: 10/24/2017 22:24    Assessment & Plan:   [place]qu[place] was seen today for cough and hypertension.  Diagnoses and all orders for this visit:  Mixed simple and mucopurulent chronic bronchitis ([place]) -     Ambulatory referral to [place]  Cough- I will check a chest x-ray to screen for mass and infiltrate. -     DG Chest 2 View; Future  RTI (respiratory tract infection)- I will empirically treat the infection with Augmentin. -     amoxicillin-clavulanate (AUGMENTIN) 875-125 MG tablet; Take 1 tablet by mouth 2 (two) times daily for 10 days.   I am having Janice Morrow. Deterding start on amoxicillin-clavulanate. I am also having her maintain her HYDROcodone-acetaminophen, gabapentin,  furosemide, OXYGEN, isosorbide mononitrate, pantoprazole, ELIQUIS, carvedilol, umeclidinium-vilanterol, zolpidem, and tiZANidine.  Meds ordered this encounter  Medications  . amoxicillin-clavulanate (AUGMENTIN) 875-125 MG tablet    Sig: Take 1 tablet by mouth 2 (two) times daily for 10 days.    Dispense:  20 tablet    Refill:  0     Follow-up: Return in about 3 weeks (around 03/27/2018).  [name], MD

## 2018-03-07 ENCOUNTER — Encounter: Payer: Self-pay | Admitting: Internal Medicine

## 2018-03-12 ENCOUNTER — Telehealth: Payer: Self-pay | Admitting: Internal Medicine

## 2018-03-12 NOTE — Telephone Encounter (Signed)
Copied from [place] [id number]. Topic: Quick Communication - See Telephone Encounter >> Mar 12, 2018  9:40 AM Gardiner Ramus wrote: CRM for notification. See Telephone encounter for: 03/12/18. Carlos from home health called and stated that pt declined home health. Clifton James stated that the pt stated that if the antibiotics do not work she will go to the ER. ZX#[phone]

## 2018-03-13 NOTE — Addendum Note (Signed)
Addended by: Aviva Signs M on: 03/13/2018 12:14 PM   Modules accepted: Orders

## 2018-03-26 DIAGNOSIS — Z79899 Other long term (current) drug therapy: Secondary | ICD-10-CM | POA: Diagnosis not present

## 2018-03-26 DIAGNOSIS — G894 Chronic pain syndrome: Secondary | ICD-10-CM | POA: Diagnosis not present

## 2018-03-26 DIAGNOSIS — R51 Headache: Secondary | ICD-10-CM | POA: Diagnosis not present

## 2018-03-26 DIAGNOSIS — Z79891 Long term (current) use of opiate analgesic: Secondary | ICD-10-CM | POA: Diagnosis not present

## 2018-03-26 DIAGNOSIS — M25569 Pain in unspecified knee: Secondary | ICD-10-CM | POA: Diagnosis not present

## 2018-03-26 DIAGNOSIS — M47817 Spondylosis without myelopathy or radiculopathy, lumbosacral region: Secondary | ICD-10-CM | POA: Diagnosis not present

## 2018-03-27 ENCOUNTER — Encounter (HOSPITAL_COMMUNITY): Payer: Self-pay

## 2018-03-27 ENCOUNTER — Emergency Department (HOSPITAL_COMMUNITY): Payer: Medicare Other

## 2018-03-27 ENCOUNTER — Inpatient Hospital Stay (HOSPITAL_COMMUNITY)
Admission: EM | Admit: 2018-03-27 | Discharge: 2018-04-01 | DRG: 190 | Disposition: A | Discharge status: Home Health | Payer: Medicare Other | Attending: Internal Medicine | Admitting: Internal Medicine

## 2018-03-27 ENCOUNTER — Other Ambulatory Visit: Payer: Self-pay

## 2018-03-27 DIAGNOSIS — R079 Chest pain, unspecified: Secondary | ICD-10-CM | POA: Diagnosis not present

## 2018-03-27 DIAGNOSIS — J441 Chronic obstructive pulmonary disease with (acute) exacerbation: Secondary | ICD-10-CM | POA: Diagnosis not present

## 2018-03-27 DIAGNOSIS — E785 Hyperlipidemia, unspecified: Secondary | ICD-10-CM | POA: Diagnosis present

## 2018-03-27 DIAGNOSIS — J90 Pleural effusion, not elsewhere classified: Secondary | ICD-10-CM

## 2018-03-27 DIAGNOSIS — R0789 Other chest pain: Secondary | ICD-10-CM | POA: Diagnosis not present

## 2018-03-27 DIAGNOSIS — G4733 Obstructive sleep apnea (adult) (pediatric): Secondary | ICD-10-CM | POA: Diagnosis present

## 2018-03-27 DIAGNOSIS — Z7901 Long term (current) use of anticoagulants: Secondary | ICD-10-CM

## 2018-03-27 DIAGNOSIS — Z6841 Body Mass Index (BMI) 40.0 and over, adult: Secondary | ICD-10-CM | POA: Diagnosis not present

## 2018-03-27 DIAGNOSIS — I2510 Atherosclerotic heart disease of native coronary artery without angina pectoris: Secondary | ICD-10-CM | POA: Diagnosis present

## 2018-03-27 DIAGNOSIS — J449 Chronic obstructive pulmonary disease, unspecified: Secondary | ICD-10-CM | POA: Diagnosis present

## 2018-03-27 DIAGNOSIS — J9621 Acute and chronic respiratory failure with hypoxia: Secondary | ICD-10-CM | POA: Diagnosis not present

## 2018-03-27 DIAGNOSIS — I5032 Chronic diastolic (congestive) heart failure: Secondary | ICD-10-CM | POA: Diagnosis not present

## 2018-03-27 DIAGNOSIS — R0902 Hypoxemia: Secondary | ICD-10-CM | POA: Diagnosis not present

## 2018-03-27 DIAGNOSIS — R05 Cough: Secondary | ICD-10-CM | POA: Diagnosis not present

## 2018-03-27 DIAGNOSIS — K219 Gastro-esophageal reflux disease without esophagitis: Secondary | ICD-10-CM | POA: Diagnosis present

## 2018-03-27 DIAGNOSIS — I110 Hypertensive heart disease with heart failure: Secondary | ICD-10-CM | POA: Diagnosis present

## 2018-03-27 DIAGNOSIS — Z9981 Dependence on supplemental oxygen: Secondary | ICD-10-CM

## 2018-03-27 DIAGNOSIS — E1151 Type 2 diabetes mellitus with diabetic peripheral angiopathy without gangrene: Secondary | ICD-10-CM | POA: Diagnosis not present

## 2018-03-27 DIAGNOSIS — Z8249 Family history of ischemic heart disease and other diseases of the circulatory system: Secondary | ICD-10-CM

## 2018-03-27 DIAGNOSIS — I252 Old myocardial infarction: Secondary | ICD-10-CM | POA: Diagnosis not present

## 2018-03-27 DIAGNOSIS — E118 Type 2 diabetes mellitus with unspecified complications: Secondary | ICD-10-CM | POA: Diagnosis present

## 2018-03-27 DIAGNOSIS — Z86711 Personal history of pulmonary embolism: Secondary | ICD-10-CM

## 2018-03-27 DIAGNOSIS — Z87891 Personal history of nicotine dependence: Secondary | ICD-10-CM

## 2018-03-27 DIAGNOSIS — E875 Hyperkalemia: Secondary | ICD-10-CM | POA: Diagnosis not present

## 2018-03-27 DIAGNOSIS — I10 Essential (primary) hypertension: Secondary | ICD-10-CM | POA: Diagnosis present

## 2018-03-27 DIAGNOSIS — Z833 Family history of diabetes mellitus: Secondary | ICD-10-CM

## 2018-03-27 DIAGNOSIS — Z66 Do not resuscitate: Secondary | ICD-10-CM | POA: Diagnosis present

## 2018-03-27 DIAGNOSIS — R0602 Shortness of breath: Secondary | ICD-10-CM | POA: Diagnosis not present

## 2018-03-27 LAB — CBC WITH DIFFERENTIAL/PLATELET
Abs Immature Granulocytes: 0 10*3/uL (ref 0.0–0.1)
Basophils Absolute: 0 10*3/uL (ref 0.0–0.1)
Basophils Relative: 0 %
Eosinophils Absolute: 0.1 10*3/uL (ref 0.0–0.7)
Eosinophils Relative: 1 %
HEMATOCRIT: 48 % — AB (ref 36.0–46.0)
HEMOGLOBIN: 14.2 g/dL (ref 12.0–15.0)
IMMATURE GRANULOCYTES: 0 %
LYMPHS ABS: 2.4 10*3/uL (ref 0.7–4.0)
LYMPHS PCT: 32 %
MCH: 26.9 pg (ref 26.0–34.0)
MCHC: 29.6 g/dL — ABNORMAL LOW (ref 30.0–36.0)
MCV: 90.9 fL (ref 78.0–100.0)
Monocytes Absolute: 0.7 10*3/uL (ref 0.1–1.0)
Monocytes Relative: 10 %
NEUTROS PCT: 57 %
Neutro Abs: 4.3 10*3/uL (ref 1.7–7.7)
Platelets: 174 10*3/uL (ref 150–400)
RBC: 5.28 MIL/uL — ABNORMAL HIGH (ref 3.87–5.11)
RDW: 14.5 % (ref 11.5–15.5)
WBC: 7.6 10*3/uL (ref 4.0–10.5)

## 2018-03-27 LAB — BASIC METABOLIC PANEL
Anion gap: 8 (ref 5–15)
BUN: 12 mg/dL (ref 8–23)
CHLORIDE: 107 mmol/L (ref 98–111)
CO2: 28 mmol/L (ref 22–32)
Calcium: 9.2 mg/dL (ref 8.9–10.3)
Creatinine, Ser: 1 mg/dL (ref 0.44–1.00)
GFR calc Af Amer: >60 mL/min (ref >60)
GFR calc non Af Amer: 56 mL/min — ABNORMAL LOW (ref >60)
Glucose, Bld: 109 mg/dL — ABNORMAL HIGH (ref 70–99)
POTASSIUM: 4 mmol/L (ref 3.5–5.1)
Sodium: 143 mmol/L (ref 135–145)

## 2018-03-27 LAB — I-STAT TROPONIN, ED: Troponin i, poc: 0.01 ng/mL (ref 0.00–0.08)

## 2018-03-27 LAB — BRAIN NATRIURETIC PEPTIDE: B NATRIURETIC PEPTIDE 5: 44.8 pg/mL (ref 0.0–100.0)

## 2018-03-27 MED ORDER — GABAPENTIN 400 MG PO CAPS
800.0000 mg | ORAL_CAPSULE | Freq: Every day | ORAL | Status: DC
  Administered 2018-03-28 – 2018-04-01 (×5): 800 mg via ORAL
  Filled 2018-03-27 (×5): qty 2

## 2018-03-27 MED ORDER — ACETAMINOPHEN 650 MG RE SUPP: 650.0000 mg | Freq: Four times a day (QID) | RECTAL | Status: DC | PRN

## 2018-03-27 MED ORDER — IOPAMIDOL (ISOVUE-370) INJECTION 76%
INTRAVENOUS | Status: AC
  Administered 2018-03-27: 80 mL
  Filled 2018-03-27: qty 100

## 2018-03-27 MED ORDER — ONDANSETRON HCL 4 MG/2ML IJ SOLN: 4.0000 mg | Freq: Four times a day (QID) | INTRAMUSCULAR | Status: DC | PRN

## 2018-03-27 MED ORDER — ZOLPIDEM TARTRATE 5 MG PO TABS
5.0000 mg | ORAL_TABLET | Freq: Every evening | ORAL | Status: DC | PRN
  Administered 2018-03-27 – 2018-03-31 (×5): 5 mg via ORAL
  Filled 2018-03-27 (×5): qty 1

## 2018-03-27 MED ORDER — DOCUSATE SODIUM 100 MG PO CAPS
100.0000 mg | ORAL_CAPSULE | Freq: Two times a day (BID) | ORAL | Status: DC
  Administered 2018-03-28 – 2018-03-31 (×3): 100 mg via ORAL
  Filled 2018-03-27 (×9): qty 1

## 2018-03-27 MED ORDER — APIXABAN 5 MG PO TABS
5.0000 mg | ORAL_TABLET | Freq: Two times a day (BID) | ORAL | Status: DC
  Administered 2018-03-27 – 2018-04-01 (×10): 5 mg via ORAL
  Filled 2018-03-27 (×10): qty 1

## 2018-03-27 MED ORDER — ALBUTEROL SULFATE (2.5 MG/3ML) 0.083% IN NEBU: 2.5000 mg | INHALATION_SOLUTION | RESPIRATORY_TRACT | Status: DC | PRN

## 2018-03-27 MED ORDER — GABAPENTIN 400 MG PO CAPS: 400.0000 mg | ORAL_CAPSULE | ORAL | Status: DC

## 2018-03-27 MED ORDER — FUROSEMIDE 20 MG PO TABS
40.0000 mg | ORAL_TABLET | Freq: Every day | ORAL | Status: DC
  Administered 2018-03-28: 40 mg via ORAL
  Filled 2018-03-27: qty 2

## 2018-03-27 MED ORDER — HYDROCODONE-ACETAMINOPHEN 10-325 MG PO TABS
1.0000 | ORAL_TABLET | Freq: Four times a day (QID) | ORAL | Status: DC | PRN
  Administered 2018-03-27 – 2018-04-01 (×10): 1 via ORAL
  Filled 2018-03-27 (×11): qty 1

## 2018-03-27 MED ORDER — ONDANSETRON HCL 4 MG PO TABS
4.0000 mg | ORAL_TABLET | Freq: Four times a day (QID) | ORAL | Status: DC | PRN
  Administered 2018-03-30: 4 mg via ORAL
  Filled 2018-03-27: qty 1

## 2018-03-27 MED ORDER — CARVEDILOL 3.125 MG PO TABS
3.1250 mg | ORAL_TABLET | Freq: Two times a day (BID) | ORAL | Status: DC
  Administered 2018-03-27 – 2018-04-01 (×11): 3.125 mg via ORAL
  Filled 2018-03-27 (×11): qty 1

## 2018-03-27 MED ORDER — IPRATROPIUM-ALBUTEROL 0.5-2.5 (3) MG/3ML IN SOLN
3.0000 mL | Freq: Four times a day (QID) | RESPIRATORY_TRACT | Status: DC
Start: 2018-03-27 — End: 2018-03-28
  Administered 2018-03-27 – 2018-03-28 (×5): 3 mL via RESPIRATORY_TRACT
  Filled 2018-03-27 (×5): qty 3

## 2018-03-27 MED ORDER — GABAPENTIN 400 MG PO CAPS
1200.0000 mg | ORAL_CAPSULE | Freq: Every day | ORAL | Status: DC
  Administered 2018-03-27 – 2018-03-31 (×5): 1200 mg via ORAL
  Filled 2018-03-27 (×5): qty 3

## 2018-03-27 MED ORDER — HYDRALAZINE HCL 20 MG/ML IJ SOLN
5.0000 mg | INTRAMUSCULAR | Status: DC | PRN
  Administered 2018-03-27: 5 mg via INTRAVENOUS
  Filled 2018-03-27: qty 1

## 2018-03-27 MED ORDER — SODIUM CHLORIDE 0.9 % IV SOLN
1.0000 g | INTRAVENOUS | Status: AC
  Administered 2018-03-28 – 2018-03-31 (×4): 1 g via INTRAVENOUS
  Filled 2018-03-27 (×5): qty 10

## 2018-03-27 MED ORDER — METHYLPREDNISOLONE SODIUM SUCC 125 MG IJ SOLR
125.0000 mg | Freq: Once | INTRAMUSCULAR | Status: AC
  Administered 2018-03-27: 125 mg via INTRAVENOUS
  Filled 2018-03-27: qty 2

## 2018-03-27 MED ORDER — ACETAMINOPHEN 325 MG PO TABS: 650.0000 mg | ORAL_TABLET | Freq: Four times a day (QID) | ORAL | Status: DC | PRN

## 2018-03-27 MED ORDER — IPRATROPIUM-ALBUTEROL 0.5-2.5 (3) MG/3ML IN SOLN
3.0000 mL | Freq: Once | RESPIRATORY_TRACT | Status: AC
  Administered 2018-03-27: 3 mL via RESPIRATORY_TRACT
  Filled 2018-03-27: qty 3

## 2018-03-27 MED ORDER — PREDNISONE 20 MG PO TABS
40.0000 mg | ORAL_TABLET | Freq: Every day | ORAL | Status: DC
  Administered 2018-03-27 – 2018-03-29 (×3): 40 mg via ORAL
  Filled 2018-03-27 (×3): qty 2

## 2018-03-27 MED ORDER — GABAPENTIN 400 MG PO CAPS
400.0000 mg | ORAL_CAPSULE | Freq: Every day | ORAL | Status: DC
  Administered 2018-03-28 – 2018-04-01 (×5): 400 mg via ORAL
  Filled 2018-03-27 (×5): qty 1

## 2018-03-27 MED ORDER — UMECLIDINIUM-VILANTEROL 62.5-25 MCG/INH IN AEPB: 1.0000 | INHALATION_SPRAY | Freq: Every day | RESPIRATORY_TRACT | Status: DC

## 2018-03-27 MED ORDER — TIZANIDINE HCL 4 MG PO TABS
2.0000 mg | ORAL_TABLET | Freq: Three times a day (TID) | ORAL | Status: DC | PRN
  Filled 2018-03-27: qty 1

## 2018-03-27 NOTE — H&P (Signed)
History and Physical    Janice Morrow CWC:[id number] DOB: [birth year] DOA: 03/27/2018  PCP: Janith Lima, MD Consultants:  Stanford Breed - cardiology Patient coming from:  Home - lives with brother; NOK: Brother, Janice Morrow  Chief Complaint:  SOB  HPI: Janice Morrow is a 72 y.o. female with medical history significant of trigeminal neuralgia; seizures; PVD; OSA on CPAP; HLD; HTN; COPD; h/o PE on Eliquis and diastolic CHF presenting with SOB.  Chest pain in the left shoulder and down the arm.  Also with a cold going on for 5 weeks.  She took antibiotics and that didn't help.  Vicks, Nyquil haven't helped.  She was seen in the pain clinic (she doesn't see them anymore as of yesterday; they discontinued her hydrocodone and she no longer has to see them and she will just be seeing her PCP) yesterday and her BP was elevated.  +cough, nonproductive.  +wheezing, mild.  She wears 4L at home, no change.  Chest pain is in the left shoulder area every time and comes and goes.  RLE edema and pain - was seeing pain clinic for R radiculopathy.  ED Course:  SOB worse when she walks.  She in on 3L home O2.  Still SOB despite nebs.  Negative cath earlier this year.  +LE edema.  Low suspicion for infection.  This appears to be a COPD exacerbation.  Review of Systems: As per HPI; otherwise review of systems reviewed and negative.   Ambulatory Status:  Ambulates with a cane  Past Medical History:  Diagnosis Date  . Acute diastolic CHF (congestive heart failure) ([place]) 08/24/2015  . Cervicalgia   . Chronic back pain   . COPD (chronic obstructive pulmonary disease) (HCC)    on 4L home O2  . Essential hypertension 08/24/2015  . GERD (gastroesophageal reflux disease)   . Headache(784.0)   . Heart murmur   . History of kidney stones   . Hyperlipidemia   . Migraine without aura, with intractable migraine, so stated, without mention of status migrainosus   . OSA on CPAP    she no longer wears CPAP  .  Osteoarthritis   . Pulmonary embolism ([place])   . PVD (peripheral vascular disease) ([place])   . Seizures ([place]) 07/2014, 10/2014   due to baclofen  . Shortness of breath dyspnea   . Trigeminal neuralgia     Past Surgical History:  Procedure Laterality Date  . LEFT HEART CATH AND CORONARY ANGIOGRAPHY N/A 10/01/2017   Procedure: LEFT HEART CATH AND CORONARY ANGIOGRAPHY;  Surgeon: Leonie Man, MD;  Location: [facility];  Service: Cardiovascular;  Laterality: N/A;  . MULTIPLE TOOTH EXTRACTIONS     "they took out part of my teeth; I took out the rest when they got loose; was suppose to get dentures; never did"  . NO PAST SURGERIES    . URETEROSCOPY WITH HOLMIUM LASER LITHOTRIPSY Left 07/09/2017   Procedure: URETEROSCOPY WITH HOLMIUM LASER LITHOTRIPSY/ STENT;  Surgeon: Festus Aloe, MD;  Location: [facility];  Service: Urology;  Laterality: Left;  . URETEROSCOPY WITH HOLMIUM LASER LITHOTRIPSY Left 07/23/2017   Procedure: URETEROSCOPY WITH HOLMIUM LASER LITHOTRIPSY /STENT;  Surgeon: Festus Aloe, MD;  Location: [facility];  Service: Urology;  Laterality: Left;  NEEDS 60 MINUTES FOR PROCEDURE    Social History   Socioeconomic History  . Marital status: Single    Spouse name: Not on file  . Number of children: 1  . Years of education: 12th  .  Highest education level: Not on file  Occupational History  . Occupation: DISABLED    Employer: UNEMPLOYED  Social Needs  . Financial resource strain: Not on file  . Food insecurity:    Worry: Not on file    Inability: Not on file  . Transportation needs:    Medical: Not on file    Non-medical: Not on file  Tobacco Use  . Smoking status: Former Smoker    Packs/day: 0.30    Years: 30.00    Pack years: 9.00    Types: Cigarettes    Last attempt to quit: 08/22/2010    Years since quitting: 7.6  . Smokeless tobacco: Never Used  Substance and Sexual Activity  . Alcohol use: No    Alcohol/week: 0.0 oz  . Drug use: No  . Sexual  activity: Never  Lifestyle  . Physical activity:    Days per week: Not on file    Minutes per session: Not on file  . Stress: Not on file  Relationships  . Social connections:    Talks on phone: Not on file    Gets together: Not on file    Attends religious service: Not on file    Active member of club or organization: Not on file    Attends meetings of clubs or organizations: Not on file    Relationship status: Not on file  . Intimate partner violence:    Fear of current or ex partner: Not on file    Emotionally abused: Not on file    Physically abused: Not on file    Forced sexual activity: Not on file  Other Topics Concern  . Not on file  Social History Narrative   Patient lives at home with her brother.    Disabled.   Education high school.   Right handed.   Caffeine Use: 5-6 20oz bottle sodas daily    Allergies  Allergen Reactions  . Baclofen Other (See Comments)    Causes seizures  . Imdur [Isosorbide Dinitrate] Other (See Comments)    Severe persistent headache  . Naproxen Palpitations    Family History  Problem Relation Age of Onset  . Heart attack Father   . Diabetes Mother   . Dementia Mother   . Diabetes Brother   . Heart disease Unknown   . Diabetes Unknown     Prior to Admission medications   Medication Sig Start Date End Date Taking? Authorizing Provider  carvedilol (COREG) 3.125 MG tablet TAKE 1 TABLET BY MOUTH 2 TIMES DAILY WITH A MEAL 12/28/17  Yes Janith Lima, MD  ELIQUIS 5 MG TABS tablet TAKE 1 TABLET BY MOUTH TWICE A DAY 12/02/17  Yes Janith Lima, MD  furosemide (LASIX) 40 MG tablet Take 1 tablet (40 mg total) by mouth daily. 07/19/17  Yes Janith Lima, MD  gabapentin (NEURONTIN) 400 MG capsule Take 400-1,200 mg by mouth See admin instructions. TAKE 1 CAPSULE AT LUNCH, TAKE 2 CAPSULES AT SUPPER AND 3 CAPSULES AT BEDTIME    Yes [provider]  OXYGEN Inhale 4 L into the lungs.   Yes [provider]  tiZANidine  (ZANAFLEX) 2 MG tablet TAKE 1 TABLET (2 MG TOTAL) BY MOUTH EVERY 8 (EIGHT) HOURS AS NEEDED. FOR MIGRAINE 02/19/18  Yes Janith Lima, MD  umeclidinium-vilanterol (ANORO ELLIPTA) 62.5-25 MCG/INH AEPB Inhale 1 puff into the lungs daily. 01/28/18  Yes Janith Lima, MD  zolpidem (AMBIEN) 5 MG tablet Take 1 tablet (5 mg total)  by mouth at bedtime as needed for sleep. 02/03/18  Yes Janith Lima, MD  HYDROcodone-acetaminophen (NORCO) 10-325 MG tablet Take 1 tablet by mouth every 6 (six) hours as needed. Patient not taking: Reported on 03/27/2018 09/15/15   Janith Lima, MD  isosorbide mononitrate (IMDUR) 30 MG 24 hr tablet Take 0.5 tablets (15 mg total) by mouth daily. Patient not taking: Reported on 03/27/2018 11/20/17   Lendon Colonel, NP  pantoprazole (PROTONIX) 20 MG tablet Take 1 tablet (20 mg total) by mouth daily. Patient not taking: Reported on 03/27/2018 11/20/17   Lendon Colonel, NP    Physical Exam: Vitals:   03/27/18 1430 03/27/18 1645 03/27/18 1822 03/27/18 1823  BP: (!) 131/103 (!) 160/102  (!) 162/130  Pulse: 64 73  68  Resp: 18 17    Temp:    98.7 F (37.1 C)  TempSrc:    Oral  SpO2: 95% 95%    Weight:   (!) 152.9 kg (337 lb)   Height:   5\' 5"  (1.651 m)      General:  Appears calm and comfortable and is NAD; appears chronically sedentary and ill Eyes:  PERRL, EOMI, normal lids, iris ENT:  grossly normal hearing, lips & tongue, mmm Neck:  no LAD, masses or thyromegaly Cardiovascular:  RRR, no m/r/g. No LE edema.  Respiratory:   CTA bilaterally with no wheezes/rales/rhonchi and only mild wheezing.  Normal respiratory effort. Abdomen:  soft, NT, ND, NABS Back:   normal alignment, no CVAT Skin:  no rash or induration seen on limited exam Musculoskeletal:  grossly normal tone BUE/BLE, good ROM, no bony abnormality Psychiatric:  grossly normal mood and affect, speech fluent and appropriate, AOx3 Neurologic:  CN 2-12 grossly intact, moves all extremities in coordinated  fashion, sensation intact    Radiological Exams on Admission: Dg Chest 2 View  Result Date: 03/27/2018 CLINICAL DATA:  CP x 2 days; pt states that its ever changing, coming and going; central chest, states it radiates to left arm; movement and coughing makes it worse; cough for 1 month EXAM: CHEST - 2 VIEW COMPARISON:  10/24/2017, chest CT 10/24/2017 FINDINGS: Heart size is accentuated by the AP technique but is mildly enlarged. There is prominence of the pulmonary arteries, stable in appearance. There are no focal consolidations or pleural effusions. No evidence for pulmonary edema. Persistent atelectasis or scarring in the lingula and RIGHT middle lobe. IMPRESSION: 1. Mildly enlarged heart.  No evidence for edema. 2. Prominent pulmonary arteries, consistent with pulmonary arterial hypertension. 3. Stable scarring or atelectasis in the RIGHT middle lobe and lingula. Electronically Signed   By: Nolon Nations M.D.   On: 03/27/2018 11:10   Ct Angio Chest Pe W/cm &/or Wo Cm  Result Date: 03/27/2018 CLINICAL DATA:  Chest pain, 2 days duration. Symptoms are intermittent. Radiates to the left arm. EXAM: CT ANGIOGRAPHY CHEST WITH CONTRAST TECHNIQUE: Multidetector CT imaging of the chest was performed using the standard protocol during bolus administration of intravenous contrast. Multiplanar CT image reconstructions and MIPs were obtained to evaluate the vascular anatomy. CONTRAST:  37mL ISOVUE-370 IOPAMIDOL (ISOVUE-370) INJECTION 76% COMPARISON:  Radiography same day.  CT 10/24/2017. FINDINGS: Cardiovascular: Pulmonary arterial opacification is good. There are no pulmonary emboli. Aortic atherosclerotic calcification is noted. Heart is mildly enlarged. No coronary artery calcification is seen. Mediastinum/Nodes: Normal Lungs/Pleura: The lungs are clear except for mild patchy atelectasis at the lung bases right more than left. No pleural fluid. Upper Abdomen: Renal calculi on the  left. Musculoskeletal: Ordinary  degenerative changes affect the spine. Review of the MIP images confirms the above findings. IMPRESSION: No pulmonary emboli. Mild cardiomegaly and aortic atherosclerosis. Mild atelectasis at the lung bases.  No other active finding. Electronically Signed   By: Nelson Chimes M.D.   On: 03/27/2018 15:35    EKG: Independently reviewed.  NSR with rate 65; nonspecific ST changes with no evidence of acute ischemia   Labs on Admission: I have personally reviewed the available labs and imaging studies at the time of the admission.  Pertinent labs:   Glucose 109 BNP 44.8 Troponin 0.01 Unremarkable CBC A1c 5.8 on 6/4  Assessment/Plan Principal Problem:   Acute on chronic respiratory failure with hypoxia (HCC) Active Problems:   Hyperlipidemia with target LDL less than 130   Severe obesity (BMI >= 40) (HCC)   COPD (chronic obstructive pulmonary disease) (HCC)   Essential hypertension   Type 2 diabetes mellitus with complication, without long-term current use of insulin (HCC)   Acute on chronic respiratory failure associated with a COPD exacerbation -Patient's shortness of breath and productive cough are most likely caused by acute COPD exacerbation.  -She has history of O2-dependent COPD and also morbid obesity and OHS with OSA not on CPAP -She does not have fever or leukocytosis.  -Chest x-ray is not consistent with pneumonia -will observe overnight -Nebulizers: scheduled Duoneb and prn albuterol -Solumedrol -> daily prednisone -IV Rocephin    Severe obesity -Likely contributing to her chronic respiratory condition, if not acute -She is already on Eliquis  HTN -Continue Coreg  Back pain with radiculopathy -She is not taking her chronic vicodin due to pain clinic issue -This may be contributing to her presentation -Resume Vicodin for now  DM -Recent A1c 5.8, indicating good control while not on medications   DVT prophylaxis: Eliquis  Code Status:  DNR - confirmed with  patient Family Communication: None present Disposition Plan:  Home once clinically improved Consults called: CM/SW/PT/OT/Nutrition/RT  Admission status: It is my clinical opinion that referral for OBSERVATION is reasonable and necessary in this patient based on the above information provided. The aforementioned taken together are felt to place the patient at high risk for further clinical deterioration. However it is anticipated that the patient may be medically stable for discharge from the hospital within 24 to 48 hours.       Karmen Bongo MD [facility]  If note is complete, please contact covering daytime or nighttime physician. [email or web address] Password [facility]  03/27/2018, 6:35 PM

## 2018-03-27 NOTE — ED Triage Notes (Signed)
Pt arrived via [facility]; pt from [facility] with c/o CP x 2 days; pt states that its ever changing, coming and going; central chest, states it radiates to left arm; movement and coughing makes it worse; Pt rec'd 324mg  ASA; 1 nitro; 20 LAC; 148/88; 88 and irreg; 16; 94% on 4L which is baseline at home; CBG 115

## 2018-03-27 NOTE — ED Provider Notes (Signed)
[facility] EMERGENCY DEPARTMENT Provider Note   CSN: [id number] Arrival date & time: 03/27/18  1009     History   Chief Complaint Chief Complaint  Patient presents with  . Chest Pain    HPI Janice Morrow is a 72 y.o. female with a past medical history of CHF, COPD, hypertension, PE currently on Eliquis, currently on 4 L of oxygen via nasal cannula at all times at home, presents to ED for evaluation of shortness of breath.  States that for the past 4 weeks, she has had "a bad cold" consisting of cough productive with white/pink mucus, chest pain, back pain worse with coughing, nausea, shortness of breath.  Also noticed lower extremity edema. She was seen and evaluated by her PCP on 7/11 when symptoms began.  She was given a prescription for Augmentin for respiratory tract infection in the setting of COPD.  She completed the course of Augmentin but states that she has not had any improvement in her symptoms.  She has been using her albuterol inhaler daily with no improvement either. States that her symptoms are actually worsening.  She took a dose of some cough medicine yesterday which has not helped.  She reports compliance with her home Eliquis.  Denies any sick contacts, leg swelling, injuries or falls, prior MI (although does have prior NSTEMI).  HPI  Past Medical History:  Diagnosis Date  . Acute diastolic CHF (congestive heart failure) ([place]) 08/24/2015  . Cervicalgia   . Chronic back pain   . COPD (chronic obstructive pulmonary disease) ([place])   . Essential hypertension 08/24/2015  . GERD (gastroesophageal reflux disease)   . Headache(784.0)   . Heart murmur   . History of kidney stones   . Hyperlipidemia   . Migraine without aura, with intractable migraine, so stated, without mention of status migrainosus   . OSA on CPAP    patient denies sleep apnea uses CPAP to get oxygent o brain  . Osteoarthritis   . Pulmonary embolism ([place])   . PVD (peripheral  vascular disease) ([place])   . PVD (peripheral vascular disease) ([place])   . Seizures ([place]) 07/2014, 10/2014   due to baclofen  . Shortness of breath dyspnea   . Trigeminal neuralgia     Patient Active Problem List   Diagnosis Date Noted  . Cough 03/06/2018  . RTI (respiratory tract infection) 03/06/2018  . Insomnia secondary to chronic pain 02/03/2018  . Cervical disc disease with myelopathy 01/28/2018  . Primary osteoarthritis of both knees 10/14/2017  . Type 2 diabetes mellitus with complication, without long-term current use of insulin ([place]) 05/20/2017  . History of pulmonary embolism 01/30/2017  . Kidney stone on left side   . History of colonic polyps 07/31/2016  . COPD (chronic obstructive pulmonary disease) ([place]) 08/24/2015  . Essential hypertension 08/24/2015  . Seizures ([place]) 08/24/2015  . Obesity hypoventilation syndrome ([place]) 08/24/2015  . Migraine without aura and with status migrainosus, not intractable 07/12/2015  . OSA on CPAP 07/12/2015  . Left ventricular diastolic dysfunction, NYHA class 1 04/11/2015  . Visit for screening mammogram 03/08/2015  . Trigeminal neuralgia of right side of face 11/23/2014  . Chronic back pain 11/26/2013  . Hyperlipidemia with target LDL less than 130 09/01/2010  . Severe obesity (BMI >= 40) ([place]) 09/01/2010  . GERD 09/01/2010    Past Surgical History:  Procedure Laterality Date  . LEFT HEART CATH AND CORONARY ANGIOGRAPHY N/A 10/01/2017   Procedure: LEFT HEART CATH AND CORONARY  ANGIOGRAPHY;  Surgeon: Leonie Man, MD;  Location: [facility];  Service: Cardiovascular;  Laterality: N/A;  . MULTIPLE TOOTH EXTRACTIONS     "they took out part of my teeth; I took out the rest when they got loose; was suppose to get dentures; never did"  . NO PAST SURGERIES    . URETEROSCOPY WITH HOLMIUM LASER LITHOTRIPSY Left 07/09/2017   Procedure: URETEROSCOPY WITH HOLMIUM LASER LITHOTRIPSY/ STENT;  Surgeon: Festus Aloe, MD;  Location: [facility];  Service: Urology;  Laterality: Left;  . URETEROSCOPY WITH HOLMIUM LASER LITHOTRIPSY Left 07/23/2017   Procedure: URETEROSCOPY WITH HOLMIUM LASER LITHOTRIPSY /STENT;  Surgeon: Festus Aloe, MD;  Location: [facility];  Service: Urology;  Laterality: Left;  NEEDS 60 MINUTES FOR PROCEDURE     OB History    Gravida  4   Para  1   Term  1   Preterm      AB  3   Living  0     SAB  3   TAB      Ectopic      Multiple      Live Births           Obstetric Comments  Dec in [place] Medications    Prior to Admission medications   Medication Sig Start Date End Date Taking? Authorizing Provider  carvedilol (COREG) 3.125 MG tablet TAKE 1 TABLET BY MOUTH 2 TIMES DAILY WITH A MEAL 12/28/17  Yes Janith Lima, MD  ELIQUIS 5 MG TABS tablet TAKE 1 TABLET BY MOUTH TWICE A DAY 12/02/17  Yes Janith Lima, MD  furosemide (LASIX) 40 MG tablet Take 1 tablet (40 mg total) by mouth daily. 07/19/17  Yes Janith Lima, MD  gabapentin (NEURONTIN) 400 MG capsule Take 400-1,200 mg by mouth See admin instructions. TAKE 1 CAPSULE AT LUNCH, TAKE 2 CAPSULES AT SUPPER AND 3 CAPSULES AT BEDTIME    Yes [provider]  OXYGEN Inhale 4 L into the lungs.   Yes [provider]  tiZANidine (ZANAFLEX) 2 MG tablet TAKE 1 TABLET (2 MG TOTAL) BY MOUTH EVERY 8 (EIGHT) HOURS AS NEEDED. FOR MIGRAINE 02/19/18  Yes Janith Lima, MD  umeclidinium-vilanterol (ANORO ELLIPTA) 62.5-25 MCG/INH AEPB Inhale 1 puff into the lungs daily. 01/28/18  Yes Janith Lima, MD  zolpidem (AMBIEN) 5 MG tablet Take 1 tablet (5 mg total) by mouth at bedtime as needed for sleep. 02/03/18  Yes Janith Lima, MD  HYDROcodone-acetaminophen (NORCO) 10-325 MG tablet Take 1 tablet by mouth every 6 (six) hours as needed. Patient not taking: Reported on 03/27/2018 09/15/15   Janith Lima, MD  isosorbide mononitrate (IMDUR) 30 MG 24 hr tablet Take 0.5 tablets (15 mg total) by mouth daily. Patient not  taking: Reported on 03/27/2018 11/20/17   Lendon Colonel, NP  pantoprazole (PROTONIX) 20 MG tablet Take 1 tablet (20 mg total) by mouth daily. Patient not taking: Reported on 03/27/2018 11/20/17   Lendon Colonel, NP    Family History Family History  Problem Relation Age of Onset  . Heart attack Father   . Diabetes Mother   . Dementia Mother   . Diabetes Brother   . Heart disease Unknown   . Diabetes Unknown     Social History Social History   Tobacco Use  . Smoking status: Former Smoker    Types: Cigarettes    Last attempt to  quit: 08/22/2010    Years since quitting: 7.6  . Smokeless tobacco: Never Used  Substance Use Topics  . Alcohol use: No    Alcohol/week: 0.0 oz  . Drug use: No     Allergies   Baclofen; Imdur [isosorbide dinitrate]; and Naproxen   Review of Systems Review of Systems  Constitutional: Negative for appetite change, chills and fever.  HENT: Negative for ear pain, rhinorrhea, sneezing and sore throat.   Eyes: Negative for photophobia and visual disturbance.  Respiratory: Positive for cough, shortness of breath and wheezing. Negative for chest tightness.   Cardiovascular: Positive for chest pain. Negative for palpitations.  Gastrointestinal: Positive for nausea. Negative for abdominal pain, blood in stool, constipation, diarrhea and vomiting.  Genitourinary: Negative for dysuria, hematuria and urgency.  Musculoskeletal: Positive for back pain. Negative for myalgias.  Skin: Negative for rash.  Neurological: Negative for dizziness, weakness and light-headedness.     Physical Exam Updated Vital Signs BP (!) 157/93   Pulse 68   Resp 18   LMP 08/27/1993 (Approximate)   SpO2 100%   Physical Exam  Constitutional: She appears well-developed and well-nourished. No distress.  HENT:  Head: Normocephalic and atraumatic.  Nose: Nose normal.  Eyes: Conjunctivae and EOM are normal. Left eye exhibits no discharge. No scleral icterus.  Neck: Normal  range of motion. Neck supple.  Cardiovascular: Normal rate, regular rhythm, normal heart sounds and intact distal pulses. Exam reveals no gallop and no friction rub.  No murmur heard. Pulmonary/Chest: Effort normal. No respiratory distress. She has wheezes in the right upper field and the left upper field.  Abdominal: Soft. Bowel sounds are normal. She exhibits no distension. There is no tenderness. There is no guarding.  Musculoskeletal: Normal range of motion. She exhibits no edema.  Bilateral lower extremity edema.  Neurological: She is alert. She exhibits normal muscle tone. Coordination normal.  Skin: Skin is warm and dry. No rash noted.  Psychiatric: She has a normal mood and affect.  Nursing note and vitals reviewed.    ED Treatments / Results  Labs (all labs ordered are listed, but only abnormal results are displayed) Labs Reviewed  BASIC METABOLIC PANEL - Abnormal; Notable for the following components:      Result Value   Glucose, Bld 109 (*)    GFR calc non Af Amer 56 (*)    All other components within normal limits  CBC WITH DIFFERENTIAL/PLATELET - Abnormal; Notable for the following components:   RBC 5.28 (*)    HCT 48.0 (*)    MCHC 29.6 (*)    All other components within normal limits  BRAIN NATRIURETIC PEPTIDE  I-STAT TROPONIN, ED    EKG EKG Interpretation  Date/Time:  Thursday March 27 2018 10:35:04 EDT Ventricular Rate:  65 PR Interval:    QRS Duration: 106 QT Interval:  419 QTC Calculation: 436 R Axis:   -34 Text Interpretation:  Sinus rhythm Ventricular premature complex Borderline prolonged PR interval Left axis deviation Baseline wander in lead(s) V3 No significant change since last tracing Confirmed by Deno Etienne [phone]) on 03/27/2018 11:00:10 AM Also confirmed by Deno Etienne [phone]), editor Shon Hale [phone])  on 03/27/2018 1:35:44 PM   Radiology Dg Chest 2 View  Result Date: 03/27/2018 CLINICAL DATA:  CP x 2 days; pt states that its ever  changing, coming and going; central chest, states it radiates to left arm; movement and coughing makes it worse; cough for 1 month EXAM: CHEST - 2 VIEW COMPARISON:  10/24/2017,  chest CT 10/24/2017 FINDINGS: Heart size is accentuated by the AP technique but is mildly enlarged. There is prominence of the pulmonary arteries, stable in appearance. There are no focal consolidations or pleural effusions. No evidence for pulmonary edema. Persistent atelectasis or scarring in the lingula and RIGHT middle lobe. IMPRESSION: 1. Mildly enlarged heart.  No evidence for edema. 2. Prominent pulmonary arteries, consistent with pulmonary arterial hypertension. 3. Stable scarring or atelectasis in the RIGHT middle lobe and lingula. Electronically Signed   By: Nolon Nations M.D.   On: 03/27/2018 11:10   Ct Angio Chest Pe W/cm &/or Wo Cm  Result Date: 03/27/2018 CLINICAL DATA:  Chest pain, 2 days duration. Symptoms are intermittent. Radiates to the left arm. EXAM: CT ANGIOGRAPHY CHEST WITH CONTRAST TECHNIQUE: Multidetector CT imaging of the chest was performed using the standard protocol during bolus administration of intravenous contrast. Multiplanar CT image reconstructions and MIPs were obtained to evaluate the vascular anatomy. CONTRAST:  36mL ISOVUE-370 IOPAMIDOL (ISOVUE-370) INJECTION 76% COMPARISON:  Radiography same day.  CT 10/24/2017. FINDINGS: Cardiovascular: Pulmonary arterial opacification is good. There are no pulmonary emboli. Aortic atherosclerotic calcification is noted. Heart is mildly enlarged. No coronary artery calcification is seen. Mediastinum/Nodes: Normal Lungs/Pleura: The lungs are clear except for mild patchy atelectasis at the lung bases right more than left. No pleural fluid. Upper Abdomen: Renal calculi on the left. Musculoskeletal: Ordinary degenerative changes affect the spine. Review of the MIP images confirms the above findings. IMPRESSION: No pulmonary emboli. Mild cardiomegaly and aortic  atherosclerosis. Mild atelectasis at the lung bases.  No other active finding. Electronically Signed   By: Nelson Chimes M.D.   On: 03/27/2018 15:35    Procedures Procedures (including critical care time)  Medications Ordered in ED Medications  ipratropium-albuterol (DUONEB) 0.5-2.5 (3) MG/3ML nebulizer solution 3 mL (3 mLs Nebulization Given 03/27/18 1119)  methylPREDNISolone sodium succinate (SOLU-MEDROL) 125 mg/2 mL injection 125 mg (125 mg Intravenous Given 03/27/18 1230)  iopamidol (ISOVUE-370) 76 % injection (80 mLs  Contrast Given 03/27/18 1511)     Initial Impression / Assessment and Plan / ED Course  I have reviewed the triage vital signs and the nursing notes.  Pertinent labs & imaging results that were available during my care of the patient were reviewed by me and considered in my medical decision making (see chart for details).  Clinical Course as of Mar 27 1554  Thu Mar 27, 2018  1447 Patient reports somewhat improvement in her symptoms with DuoNeb given.  Informed her that she will need a CTA to evaluate for PE.   [HK]  1457 Temp 98.20F oral   [HK]    Clinical Course User Index [HK] Delia Heady, PA-C    72 year old female with past medical history of NSTEMI, CHF, COPD, hypertension, PE currently on Eliquis, chronically 4 L of oxygen via nasal cannula presents to ED for evaluation of shortness of breath.  States that she has been having "a bad cold" for the past 4 weeks consistent with cough productive with white/pink mucus, chest pain, back pain worse with coughing, nausea and shortness of breath.  She was seen and evaluated by PCP on 7/11 when symptoms began but states that she has not had any improvement with Augmentin.  On physical exam patient is mildly tachypneic.  There is lower extremity edema.  Wheezing noted on exam initially.  She is afebrile.  CBC, BMP, BNP, troponin unremarkable.  EKG shows tracing similar to prior.  Chest x-ray is unremarkable.  Patient  will need  CTA based on her history of lower extremity edema, pink mucous production and cough, history of PE. CTA negative for PE for other abnormality.  Patient continues to be short of breath with exertion.  She will need to be admitted for COPD exacerbation. Hospitalist to admit. Appreciate their help with management of this patient. Patient discussed with and seen by my attending, Dr. Tyrone Nine.  Portions of this note were generated with Lobby[employer]. Dictation errors may occur despite best attempts at proofreading.   Final Clinical Impressions(s) / ED Diagnoses   Final diagnoses:  COPD exacerbation [facility])    ED Discharge Orders    None       Delia Heady, PA-C 03/27/18 [place], DO 03/28/18 1502

## 2018-03-27 NOTE — ED Notes (Signed)
This RN spoke with Dr. Lorin Mercy ref. Pt headache and blood pressure.  Meds being ordered at this time

## 2018-03-27 NOTE — ED Notes (Signed)
Attempted report 

## 2018-03-28 DIAGNOSIS — Z87891 Personal history of nicotine dependence: Secondary | ICD-10-CM | POA: Diagnosis not present

## 2018-03-28 DIAGNOSIS — Z8249 Family history of ischemic heart disease and other diseases of the circulatory system: Secondary | ICD-10-CM | POA: Diagnosis not present

## 2018-03-28 DIAGNOSIS — K219 Gastro-esophageal reflux disease without esophagitis: Secondary | ICD-10-CM | POA: Diagnosis present

## 2018-03-28 DIAGNOSIS — I252 Old myocardial infarction: Secondary | ICD-10-CM | POA: Diagnosis not present

## 2018-03-28 DIAGNOSIS — I2510 Atherosclerotic heart disease of native coronary artery without angina pectoris: Secondary | ICD-10-CM | POA: Diagnosis present

## 2018-03-28 DIAGNOSIS — E785 Hyperlipidemia, unspecified: Secondary | ICD-10-CM | POA: Diagnosis present

## 2018-03-28 DIAGNOSIS — Z6841 Body Mass Index (BMI) 40.0 and over, adult: Secondary | ICD-10-CM | POA: Diagnosis not present

## 2018-03-28 DIAGNOSIS — Z833 Family history of diabetes mellitus: Secondary | ICD-10-CM | POA: Diagnosis not present

## 2018-03-28 DIAGNOSIS — J441 Chronic obstructive pulmonary disease with (acute) exacerbation: Secondary | ICD-10-CM | POA: Diagnosis present

## 2018-03-28 DIAGNOSIS — I110 Hypertensive heart disease with heart failure: Secondary | ICD-10-CM | POA: Diagnosis present

## 2018-03-28 DIAGNOSIS — Z7901 Long term (current) use of anticoagulants: Secondary | ICD-10-CM | POA: Diagnosis not present

## 2018-03-28 DIAGNOSIS — E875 Hyperkalemia: Secondary | ICD-10-CM | POA: Diagnosis not present

## 2018-03-28 DIAGNOSIS — Z9981 Dependence on supplemental oxygen: Secondary | ICD-10-CM | POA: Diagnosis not present

## 2018-03-28 DIAGNOSIS — G4733 Obstructive sleep apnea (adult) (pediatric): Secondary | ICD-10-CM | POA: Diagnosis present

## 2018-03-28 DIAGNOSIS — I5032 Chronic diastolic (congestive) heart failure: Secondary | ICD-10-CM | POA: Diagnosis present

## 2018-03-28 DIAGNOSIS — E1151 Type 2 diabetes mellitus with diabetic peripheral angiopathy without gangrene: Secondary | ICD-10-CM | POA: Diagnosis present

## 2018-03-28 DIAGNOSIS — J9621 Acute and chronic respiratory failure with hypoxia: Secondary | ICD-10-CM | POA: Diagnosis present

## 2018-03-28 DIAGNOSIS — Z66 Do not resuscitate: Secondary | ICD-10-CM | POA: Diagnosis present

## 2018-03-28 DIAGNOSIS — Z86711 Personal history of pulmonary embolism: Secondary | ICD-10-CM | POA: Diagnosis not present

## 2018-03-28 LAB — BASIC METABOLIC PANEL
Anion gap: 8 (ref 5–15)
BUN: 17 mg/dL (ref 8–23)
CHLORIDE: 105 mmol/L (ref 98–111)
CO2: 27 mmol/L (ref 22–32)
CREATININE: 1.05 mg/dL — AB (ref 0.44–1.00)
Calcium: 9.4 mg/dL (ref 8.9–10.3)
GFR calc non Af Amer: 53 mL/min — ABNORMAL LOW (ref >60)
GLUCOSE: 145 mg/dL — AB (ref 70–99)
Potassium: 4.3 mmol/L (ref 3.5–5.1)
Sodium: 140 mmol/L (ref 135–145)

## 2018-03-28 LAB — CBC
HCT: 48.6 % — ABNORMAL HIGH (ref 36.0–46.0)
Hemoglobin: 14.5 g/dL (ref 12.0–15.0)
MCH: 27 pg (ref 26.0–34.0)
MCHC: 29.8 g/dL — ABNORMAL LOW (ref 30.0–36.0)
MCV: 90.5 fL (ref 78.0–100.0)
Platelets: 185 10*3/uL (ref 150–400)
RBC: 5.37 MIL/uL — ABNORMAL HIGH (ref 3.87–5.11)
RDW: 14.6 % (ref 11.5–15.5)
WBC: 7.2 10*3/uL (ref 4.0–10.5)

## 2018-03-28 LAB — HIV ANTIBODY (ROUTINE TESTING W REFLEX): HIV Screen 4th Generation wRfx: NONREACTIVE

## 2018-03-28 MED ORDER — FUROSEMIDE 10 MG/ML IJ SOLN
40.0000 mg | Freq: Two times a day (BID) | INTRAMUSCULAR | Status: DC
  Administered 2018-03-28 – 2018-03-31 (×6): 40 mg via INTRAVENOUS
  Filled 2018-03-28 (×6): qty 4

## 2018-03-28 MED ORDER — IPRATROPIUM-ALBUTEROL 0.5-2.5 (3) MG/3ML IN SOLN
3.0000 mL | Freq: Four times a day (QID) | RESPIRATORY_TRACT | Status: DC
  Administered 2018-03-29: 3 mL via RESPIRATORY_TRACT
  Filled 2018-03-28: qty 3

## 2018-03-28 NOTE — Care Management Note (Addendum)
Case Management Note  Patient Details  Name: Janice Morrow MRN: [id number] Date of Birth: Nov 30, 1945  Subjective/Objective:   From home with brother, has home oxygen 3 liters, presents with acute/chronic resp failure/copd ex, on eliquis (pta), severe obesity, htn, back pain with radiculopathy, dm.  NCM spoke with patient about rec for SNF, she declined SNF, NCM  Offered choice for [facility] services, she states she does not want HHPT/HHOT, but she will take [facility] for COPD diesease management, she chose [facility].  Referral made to [facility] with [facility] for [facility].  Patient lives in hotel room with her brother, [facility] ,[place] , [address], [place].  soc will begin 24-48 hrs post dc.  Will need HHRN order with face to face.    8/6 Tomi Bamberger RN, BSN - patient states she needs a neb machine and a smaller oxygen tank, NCM contacted [facility] who patient has home oxygen with, she states she needs order for neb machine and order to titrate for poc.  Patient has a home concentrator.  NCM faxed orders to [phone] for titrate for poc.    NCM contacted [facility] they will bring neb machine  up to patient room.             Action/Plan: NCM will follow for transition of care needs.   Expected Discharge Date:                  Expected Discharge Plan:  Home/Self Care  In-House Referral:     Discharge planning Services  CM Consult  Post Acute Care Choice:    Choice offered to:     DME Arranged:    DME Agency:     HH Arranged:   RN [place] Agency:   [place]  Status of Service:  In process, will continue to follow  If discussed at Long Length of Stay Meetings, dates discussed:    Additional Comments:  Janice Mayo, RN 03/28/2018, 9:13 AM

## 2018-03-28 NOTE — Evaluation (Signed)
Occupational Therapy Evaluation Patient Details Name: Janice Morrow MRN: [id number] DOB: 06-16-1946 Today's Date: 03/28/2018    History of Present Illness Pt arrived via [facility]; pt from hm with c/o CP x 2 days; pt states that its ever changing, coming and going; central chest, states it radiates to left arm; movement and coughing makes it worse;   Clinical Impression   Pt with decline in function and safety with ADLs and ADL mobility with decreased strength, balance and endurance. PTA pt living with brother in long stay hotel, pt reports being independent with ADLs and with mobility within the room with assist of her [facility] and rolling office chair. Pt reports taking tub baths. Pt currently limited in safe LB ADLs and  mobility by decreased strength and endurance as well as decreased awareness of deficit. Pt currently supervision for bed mobility, min Ax2 for sit>stand transfers, and stand step transfer to recliner. Pt educated on need for rehab to build her strength and mobility before going home. Despite OT/PT recommendation for SNF rehab, pt adamantly states she will not go to rehab. Pt is also very reluctant to have [place] therapy either. Pt would benefit from acute OT services to address impairments to maximize level of function and safety      Follow Up Recommendations  SNF    Equipment Recommendations  Other (comment)(ADL A/E for LB)    Recommendations for Other Services       Precautions / Restrictions Precautions Precautions: Fall Precaution Comments: morbid obesity Restrictions Weight Bearing Restrictions: No      Mobility Bed Mobility Overal bed mobility: Needs Assistance Bed Mobility: Supine to Sit     Supine to sit: Supervision;HOB elevated     General bed mobility comments: supervision for safety, increased time and effort to get to EoB  Transfers Overall transfer level: Needs assistance Equipment used: Straight cane Transfers: Sit to/from [employer]  Transfers Sit to Stand: Min assist;+2 physical assistance Stand pivot transfers: Min assist;+2 physical assistance       General transfer comment: minAx2 for powerup and steadying in standing for pericare EoB, min A for stand step transfer from EoB to recliner. vc for fully upright    Balance Overall balance assessment: Needs assistance Sitting-balance support: No upper extremity supported;Feet supported Sitting balance-Leahy Scale: Fair     Standing balance support: Bilateral upper extremity supported;During functional activity Standing balance-Leahy Scale: Poor Standing balance comment: therapist assist for maintaining balance                           ADL either performed or assessed with clinical judgement   ADL Overall ADL's : Needs assistance/impaired Eating/Feeding: Independent;Sitting   Grooming: Wash/dry hands;Wash/dry face;Min guard;Sitting   Upper Body Bathing: Min guard;Sitting   Lower Body Bathing: Maximal assistance   Upper Body Dressing : Min guard;Sitting   Lower Body Dressing: Maximal assistance   Toilet Transfer: Minimal assistance;+2 for physical assistance;Cueing for safety;Stand-pivot;Ambulation Toilet Transfer Details (indicate cue type and reason): simulated bed to recliner Toileting- Clothing Manipulation and Hygiene: Total assistance;Sit to/from stand   Scientist, research (medical): (pt reports that she gets into tub for bathing)     General ADL Comments: pt reluctant to consider using ADL A/E and reports her tehniques for bathing, dressing and toileting at home and states that her brother will take care of her     Vision Baseline Vision/History: Wears glasses Wears Glasses: Reading only Patient Visual Report: No change from baseline  Perception     Praxis      Pertinent Vitals/Pain Pain Assessment: No/denies pain     Hand Dominance Right   Extremity/Trunk Assessment Upper Extremity Assessment Upper Extremity Assessment:  Generalized weakness   Lower Extremity Assessment Lower Extremity Assessment: RLE deficits/detail;LLE deficits/detail RLE Deficits / Details: ROM limited by body habitus LLE Deficits / Details: ROM limited by body habitus       Communication Communication Communication: No difficulties   Cognition Arousal/Alertness: Awake/alert Behavior During Therapy: WFL for tasks assessed/performed Overall Cognitive Status: Within Functional Limits for tasks assessed                                     General Comments  VSS on 4L O2 via nasal cannula    Exercises     Shoulder Instructions      Home Living Family/patient expects to be discharged to:: Private residence Living Arrangements: Other relatives Available Help at Discharge: Family;Available 24 hours/day Type of Home: Apartment(extended stay hotel per pt?) Home Access: Level entry     Home Layout: One level     Bathroom Shower/Tub: Teacher, early years/pre: Standard     Home Equipment: Cane - single point          Prior Functioning/Environment Level of Independence: Independent with assistive device(s)        Comments: Gets down into tub rather than showers and not open to changing this. Stays in her boarding room mostly and not very active. Brother lives with her and does all grocery shopping.         OT Problem List: Decreased strength;Decreased activity tolerance;Decreased knowledge of use of DME or AE;Impaired balance (sitting and/or standing);Decreased coordination;Obesity      OT Treatment/Interventions: Self-care/ADL training;Balance training;Therapeutic exercise;DME and/or AE instruction;Therapeutic activities;Neuromuscular education;Patient/family education    OT Goals(Current goals can be found in the care plan section) Acute Rehab OT Goals Patient Stated Goal: go home, not to rehab OT Goal Formulation: With patient Time For Goal Achievement: 04/11/18 Potential to Achieve  Goals: Good ADL Goals Pt Will Perform Grooming: with supervision;with set-up;sitting Pt Will Perform Upper Body Bathing: with supervision;with set-up;sitting Pt Will Perform Lower Body Bathing: with mod assist;with adaptive equipment Pt Will Perform Upper Body Dressing: with supervision;with set-up;sitting Pt Will Perform Lower Body Dressing: with mod assist;with adaptive equipment Pt Will Transfer to Toilet: with min assist;ambulating;regular height toilet;bedside commode;grab bars Pt Will Perform Toileting - Clothing Manipulation and hygiene: with min assist;sit to/from stand  OT Frequency: Min 2X/week   Barriers to D/C: Decreased caregiver support  pt resistant to consider ST SNF or HH for further therapy after acute d/c       Co-evaluation PT/OT/SLP Co-Evaluation/Treatment: Yes Reason for Co-Treatment: To address functional/ADL transfers;For patient/therapist safety   OT goals addressed during session: ADL's and self-care;Proper use of Adaptive equipment and DME      AM-PAC PT "6 Clicks" Daily Activity     Outcome Measure Help from another person eating meals?: None Help from another person taking care of personal grooming?: A Little Help from another person toileting, which includes using toliet, bedpan, or urinal?: Total Help from another person bathing (including washing, rinsing, drying)?: A Lot Help from another person to put on and taking off regular upper body clothing?: A Little Help from another person to put on and taking off regular lower body clothing?: Total 6 Click Score: 14  End of Session    Activity Tolerance: Patient limited by fatigue Patient left: in chair;with call bell/phone within reach;with chair alarm set  OT Visit Diagnosis: Unsteadiness on feet (R26.81);Other abnormalities of gait and mobility (R26.89);Muscle weakness (generalized) (M62.81)                Time: 2025-4270 OT Time Calculation (min): 25 min Charges:  OT General Charges $OT  Visit: 1 Visit OT Evaluation $OT Eval Moderate Complexity: 1 Mod    Britt Bottom 03/28/2018, 1:44 PM

## 2018-03-28 NOTE — Progress Notes (Signed)
Nutrition Consult/Brief Note  RD consulted per COPD Gold Protocol.  Pt was also identified on the Malnutrition Screening Tool (MST) Report.  Wt Readings from Last 15 Encounters:  03/27/18 (!) 337 lb (152.9 kg)  03/06/18 (!) 337 lb (152.9 kg)  01/28/18 (!) 333 lb (151 kg)  10/29/17 (!) 337 lb 11.9 oz (153.2 kg)  10/03/17 (!) 340 lb 6.2 oz (154.4 kg)  07/23/17 (!) 350 lb (158.8 kg)  07/22/17 (!) 350 lb (158.8 kg)  07/09/17 (!) 334 lb (151.5 kg)  07/08/17 (!) 334 lb (151.5 kg)  06/24/17 (!) 349 lb (158.3 kg)  05/20/17 (!) 346 lb 12 oz (157.3 kg)  01/30/17 (!) 338 lb (153.3 kg)  01/19/17 (!) 324 lb 6.4 oz (147.1 kg)  12/04/16 (!) 325 lb 6.4 oz (147.6 kg)  07/31/16 (!) 315 lb (142.9 kg)   Body mass index is 56.08 kg/m. Patient meets criteria for Obesity Class III based on current BMI.   Current diet order is Heart Healthy, patient is consuming approximately n/a% of meals at this time. Labs and medications reviewed.   No nutrition interventions warranted at this time. If nutrition issues arise, please consult RD.   Janice Morrow, RD, LDN Pager #: [phone] After-Hours Pager #: [phone]

## 2018-03-28 NOTE — Progress Notes (Signed)
CSW received consult regarding PT recommendation of SNF at discharge.  Patient is refusing SNF. She states that she is going home at discharge and only wants a home health nurse once a week. RNCM aware.  CSW signing off.   Percell Locus Jacklyne Baik LCSW [phone]

## 2018-03-28 NOTE — Progress Notes (Addendum)
[facility] TEAM 1 - Stepdown/ICU TEAM  Janice Morrow  HCW:[id number] DOB: 1945/12/20 DOA: 03/27/2018 PCP: Janith Lima, MD    Brief Narrative:  72 y.o. female with a hx of trigeminal neuralgia; seizures; PVD; OSA on CPAP; HLD; HTN; COPD; PE on Eliquis, and diastolic CHF who presented with SOB and chest pain in the left shoulder and down the arm.   Significant Events: 8/1 admit through ED   Subjective: Reports that she remains short of breath at time my exam but is not in extremis.  Reports ongoing constant left shoulder and arm pain.  Denies abdominal pain nausea vomiting diarrhea.  Assessment & Plan:  Acute on chronic respiratory failure - COPD exacerbation Appears to be slowly improving - continue usual medical therapies and follow clinically  Chest pain  Pain is not consistent with angina by history - cardiac cath in February of this year noted only minimal coronary artery disease  Chronic diastolic CHF TTE March 1761 noted EF 55-60 % with grade 1 DD - dry weight over last 6 months appears to be approximately 151 kg - gently diurese and follow Filed Weights   03/27/18 1822  Weight: (!) 152.9 kg (337 lb)    Morbid obesity - Body mass index is 56.08 kg/m.  HTN Blood pressure currently well controlled  Back pain with radiculopathy not taking her chronic vicodin due to pain clinic issue  DVT prophylaxis: Eliquis Code Status: FULL CODE Family Communication: no family present at time of exam  Disposition Plan: PT/OT - ambulate - follow resp status - diurese as able   Consultants:  none  Antimicrobials:  None  Objective: Blood pressure (!) 142/83, pulse 87, temperature 97.7 F (36.5 C), temperature source Oral, resp. rate 20, height 5\' 5"  (1.651 m), weight (!) 152.9 kg (337 lb), last menstrual period 08/27/1993, SpO2 94 %.  Intake/Output Summary (Last 24 hours) at 03/28/2018 0853 Last data filed at 03/28/2018 6073 Gross per 24 hour  Intake -  Output 500 ml  Net  -500 ml   Filed Weights   03/27/18 1822  Weight: (!) 152.9 kg (337 lb)    Examination: General: No acute respiratory distress at rest in bed  Lungs: Very distant breath sounds throughout - mild expiratory wheezing - mild bibasilar crackles Cardiovascular: Regular rate and rhythm without murmur gallop or rub normal S1 and S2 Abdomen: Morbidly obese, soft, no rebound, bowel sounds positive Extremities: 1+ bilateral lower extremity edema  CBC: Recent Labs  Lab 03/27/18 1138 03/28/18 0335  WBC 7.6 7.2  NEUTROABS 4.3  --   HGB 14.2 14.5  HCT 48.0* 48.6*  MCV 90.9 90.5  PLT 174 710   Basic Metabolic Panel: Recent Labs  Lab 03/27/18 1138 03/28/18 0335  NA 143 140  K 4.0 4.3  CL 107 105  CO2 28 27  GLUCOSE 109* 145*  BUN 12 17  CREATININE 1.00 1.05*  CALCIUM 9.2 9.4   GFR: Estimated Creatinine Clearance: 76.2 mL/min (A) (by C-G formula based on SCr of 1.05 mg/dL (H)).  Liver Function Tests: No results for input(s): AST, ALT, ALKPHOS, BILITOT, PROT, ALBUMIN in the last 168 hours. No results for input(s): LIPASE, AMYLASE in the last 168 hours. No results for input(s): AMMONIA in the last 168 hours.  Coagulation Profile: No results for input(s): INR, PROTIME in the last 168 hours.  Cardiac Enzymes: No results for input(s): CKTOTAL, CKMB, CKMBINDEX, TROPONINI in the last 168 hours.  HbA1C: Hemoglobin A1C  Date/Time Value Ref Range  Status  01/28/2018 02:23 PM 5.8 (A) 4.0 - 5.6 % Final   Hgb A1c MFr Bld  Date/Time Value Ref Range Status  05/20/2017 05:05 PM 6.1 4.6 - 6.5 % Final    Comment:    Glycemic Control Guidelines for People with Diabetes:Non Diabetic:  <6%Goal of Therapy: <7%Additional Action Suggested:  >8%   01/30/2017 04:29 PM 6.5 4.6 - 6.5 % Final    Comment:    Glycemic Control Guidelines for People with Diabetes:Non Diabetic:  <6%Goal of Therapy: <7%Additional Action Suggested:  >8%     CBG: No results for input(s): GLUCAP in the last 168  hours.  No results found for this or any previous visit (from the past 240 hour(s)).   Scheduled Meds: . apixaban  5 mg Oral BID  . carvedilol  3.125 mg Oral BID WC  . docusate sodium  100 mg Oral BID  . furosemide  40 mg Oral Daily  . gabapentin  400 mg Oral Q lunch   And  . gabapentin  800 mg Oral Q supper   And  . gabapentin  1,200 mg Oral QHS  . ipratropium-albuterol  3 mL Nebulization Q6H  . predniSONE  40 mg Oral Q breakfast     LOS: 0 days   Cherene Altes, MD [facility] Office  [phone] Pager - Text Page per Amion  If 7PM-7AM, please contact night-coverage per Amion 03/28/2018, 8:53 AM

## 2018-03-28 NOTE — Evaluation (Signed)
Physical Therapy Evaluation Patient Details Name: Janice Morrow MRN: [id number] DOB: 04-18-1946 Today's Date: 03/28/2018   History of Present Illness  Pt arrived via [facility]; pt from [facility] with c/o CP x 2 days; pt states that its ever changing, coming and going; central chest, states it radiates to left arm; movement and coughing makes it worse;  Clinical Impression  PTA pt living with brother in long stay hotel, pt reports being independent in mobility within the room with assist of her [facility] and rolling office chair. Pt reports taking tub baths and independence in dressing. Pt currently limited in safe mobility by decreased strength and endurance as well as decreased awareness of deficit. Pt currently supervision for bed mobility, minAx2 for sit>stand transfers, and stand step transfer to recliner. Pt educated on need for rehab to build her strength and mobility before going home. Despite PT recommendation for SNF rehab, pt adamantly states she will not go to rehab. Pt is also very reluctant to have HHPT either. PT will continue to follow acutely to progress mobility until d/c.     Follow Up Recommendations Supervision for mobility/OOB;SNF(pt will decline in which case recommend HHPT)    Equipment Recommendations  3in1 (PT)(bariatric)    Recommendations for Other Services       Precautions / Restrictions Precautions Precautions: Fall Precaution Comments: morbid obesity Restrictions Weight Bearing Restrictions: No      Mobility  Bed Mobility Overal bed mobility: Needs Assistance Bed Mobility: Supine to Sit     Supine to sit: Supervision;HOB elevated     General bed mobility comments: supervision for safety, increased time and effort to get to EoB  Transfers Overall transfer level: Needs assistance Equipment used: Straight cane Transfers: Sit to/from Stand;Stand Pivot Transfers Sit to Stand: Min assist;+2 physical assistance Stand pivot transfers: Min assist;+2 physical  assistance       General transfer comment: minAx2 for powerup and steadying in standing for pericare EoB, min A for stand step transfer from EoB to recliner. vc for fully upright        Balance Overall balance assessment: Needs assistance Sitting-balance support: No upper extremity supported;Feet supported Sitting balance-Leahy Scale: Fair     Standing balance support: Bilateral upper extremity supported Standing balance-Leahy Scale: Poor Standing balance comment: therapist assist for maintaining balance                             Pertinent Vitals/Pain Pain Assessment: No/denies pain    Home Living Family/patient expects to be discharged to:: Private residence Living Arrangements: Other relatives Available Help at Discharge: Family;Available 24 hours/day Type of Home: Apartment(extended stay hotel per pt?) Home Access: Level entry     Home Layout: One level Home Equipment: Cane - single point      Prior Function Level of Independence: Independent with assistive device(s)         Comments: Gets down into tub rather than showers and not open to changing this. Stays in her boarding room mostly and not very active. Brother lives with her and does all grocery shopping.      Hand Dominance   Dominant Hand: Right    Extremity/Trunk Assessment   Upper Extremity Assessment Upper Extremity Assessment: Generalized weakness    Lower Extremity Assessment Lower Extremity Assessment: RLE deficits/detail;LLE deficits/detail RLE Deficits / Details: ROM limited by body habitus LLE Deficits / Details: ROM limited by body habitus       Communication  Communication: No difficulties  Cognition Arousal/Alertness: Awake/alert Behavior During Therapy: WFL for tasks assessed/performed Overall Cognitive Status: Within Functional Limits for tasks assessed                                        General Comments General comments (skin integrity,  edema, etc.): VSS on 4L O2 via nasal cannula        Assessment/Plan    PT Assessment Patient needs continued PT services  PT Problem List Decreased strength;Decreased range of motion;Decreased activity tolerance;Decreased balance;Decreased mobility;Decreased safety awareness       PT Treatment Interventions DME instruction;Gait training;Functional mobility training;Therapeutic activities;Therapeutic exercise;Balance training;Cognitive remediation;Patient/family education    PT Goals (Current goals can be found in the Care Plan section)  Acute Rehab PT Goals Patient Stated Goal: go home, not to rehab PT Goal Formulation: With patient Time For Goal Achievement: 04/11/18 Potential to Achieve Goals: Fair    Frequency Min 3X/week   Barriers to discharge Decreased caregiver support      Co-evaluation   Reason for Co-Treatment: To address functional/ADL transfers;For patient/therapist safety   OT goals addressed during session: ADL's and self-care;Proper use of Adaptive equipment and DME       AM-PAC PT "6 Clicks" Daily Activity  Outcome Measure Difficulty turning over in bed (including adjusting bedclothes, sheets and blankets)?: A Little Difficulty moving from lying on back to sitting on the side of the bed? : Unable Difficulty sitting down on and standing up from a chair with arms (e.g., wheelchair, bedside commode, etc,.)?: Unable Help needed moving to and from a bed to chair (including a wheelchair)?: A Little Help needed walking in hospital room?: A Lot Help needed climbing 3-5 steps with a railing? : Total 6 Click Score: 11    End of Session Equipment Utilized During Treatment: Oxygen Activity Tolerance: Patient limited by fatigue Patient left: in chair;with call bell/phone within reach;with chair alarm set Nurse Communication: Mobility status PT Visit Diagnosis: Unsteadiness on feet (R26.81);Other abnormalities of gait and mobility (R26.89);Muscle weakness  (generalized) (M62.81);Difficulty in walking, not elsewhere classified (R26.2)    Time: 5053-9767 PT Time Calculation (min) (ACUTE ONLY): 25 min   Charges:   PT Evaluation $PT Eval Moderate Complexity: 1 Mod          Shraddha Lebron B. Migdalia Dk PT, DPT Acute Rehabilitation  [phone] Pager [phone]    [place] 03/28/2018, 1:37 PM

## 2018-03-29 DIAGNOSIS — J9621 Acute and chronic respiratory failure with hypoxia: Secondary | ICD-10-CM

## 2018-03-29 LAB — COMPREHENSIVE METABOLIC PANEL
ALBUMIN: 3.3 g/dL — AB (ref 3.5–5.0)
ALT: 13 U/L (ref 0–44)
AST: 17 U/L (ref 15–41)
Alkaline Phosphatase: 46 U/L (ref 38–126)
Anion gap: 9 (ref 5–15)
BILIRUBIN TOTAL: 0.7 mg/dL (ref 0.3–1.2)
BUN: 23 mg/dL (ref 8–23)
CHLORIDE: 102 mmol/L (ref 98–111)
CO2: 29 mmol/L (ref 22–32)
Calcium: 9.3 mg/dL (ref 8.9–10.3)
Creatinine, Ser: 1.21 mg/dL — ABNORMAL HIGH (ref 0.44–1.00)
GFR calc Af Amer: 52 mL/min — ABNORMAL LOW (ref >60)
GFR calc non Af Amer: 45 mL/min — ABNORMAL LOW (ref >60)
GLUCOSE: 107 mg/dL — AB (ref 70–99)
Potassium: 4.4 mmol/L (ref 3.5–5.1)
SODIUM: 140 mmol/L (ref 135–145)
Total Protein: 6.2 g/dL — ABNORMAL LOW (ref 6.5–8.1)

## 2018-03-29 LAB — CBC
HCT: 48 % — ABNORMAL HIGH (ref 36.0–46.0)
Hemoglobin: 14.1 g/dL (ref 12.0–15.0)
MCH: 26.9 pg (ref 26.0–34.0)
MCHC: 29.4 g/dL — AB (ref 30.0–36.0)
MCV: 91.6 fL (ref 78.0–100.0)
Platelets: 184 10*3/uL (ref 150–400)
RBC: 5.24 MIL/uL — ABNORMAL HIGH (ref 3.87–5.11)
RDW: 14.6 % (ref 11.5–15.5)
WBC: 11.5 10*3/uL — ABNORMAL HIGH (ref 4.0–10.5)

## 2018-03-29 LAB — MAGNESIUM: Magnesium: 2.1 mg/dL (ref 1.7–2.4)

## 2018-03-29 MED ORDER — GUAIFENESIN-CODEINE 100-10 MG/5ML PO SOLN
5.0000 mL | Freq: Four times a day (QID) | ORAL | Status: DC | PRN
  Administered 2018-03-29 – 2018-03-31 (×4): 5 mL via ORAL
  Filled 2018-03-29 (×5): qty 5

## 2018-03-29 MED ORDER — IPRATROPIUM-ALBUTEROL 0.5-2.5 (3) MG/3ML IN SOLN
3.0000 mL | Freq: Three times a day (TID) | RESPIRATORY_TRACT | Status: DC
  Administered 2018-03-29 – 2018-04-01 (×10): 3 mL via RESPIRATORY_TRACT
  Filled 2018-03-29 (×10): qty 3

## 2018-03-29 MED ORDER — METHYLPREDNISOLONE SODIUM SUCC 125 MG IJ SOLR
60.0000 mg | Freq: Two times a day (BID) | INTRAMUSCULAR | Status: DC
  Administered 2018-03-29 – 2018-03-31 (×6): 60 mg via INTRAVENOUS
  Filled 2018-03-29 (×6): qty 2

## 2018-03-29 NOTE — Discharge Instructions (Signed)
Information on my medicine - ELIQUIS (apixaban)  Why was Eliquis prescribed for you? Eliquis was prescribed for you to reduce the risk of a blood clots.  What do You need to know about Eliquis ? Take your Eliquis TWICE DAILY - one tablet in the morning and one tablet in the evening with or without food.  It would be best to take the doses about the same time each day.  If you have difficulty swallowing the tablet whole please discuss with your pharmacist how to take the medication safely.  Take Eliquis exactly as prescribed by your doctor and DO NOT stop taking Eliquis without talking to the doctor who prescribed the medication.  Stopping may increase your risk of developing a new clot or stroke.  Refill your prescription before you run out.  After discharge, you should have regular check-up appointments with your healthcare provider that is prescribing your Eliquis.  In the future your dose may need to be changed if your kidney function or weight changes by a significant amount or as you get older.  What do you do if you miss a dose? If you miss a dose, take it as soon as you remember on the same day and resume taking twice daily.  Do not take more than one dose of ELIQUIS at the same time.  Important Safety Information A possible side effect of Eliquis is bleeding. You should call your healthcare provider right away if you experience any of the following: ? Bleeding from an injury or your nose that does not stop. ? Unusual colored urine (red or dark brown) or unusual colored stools (red or black). ? Unusual bruising for unknown reasons. ? A serious fall or if you hit your head (even if there is no bleeding).  Some medicines may interact with Eliquis and might increase your risk of bleeding or clotting while on Eliquis. To help avoid this, consult your healthcare provider or pharmacist prior to using any new prescription or non-prescription medications, including herbals,  vitamins, non-steroidal anti-inflammatory drugs (NSAIDs) and supplements.  This website has more information on Eliquis (apixaban): [email or web address].

## 2018-03-29 NOTE — Progress Notes (Signed)
PROGRESS NOTE    Janice Morrow  EHU:[id number] DOB: 1946-03-26 DOA: 03/27/2018 PCP: Janith Lima, MD    Brief Narrative: 72 y.o.femalewith a hx oftrigeminal neuralgia; seizures; PVD; OSA on CPAP; HLD; HTN; COPD; PE on Eliquis, and diastolic CHF who presented with SOB and chest pain in the leftshoulderand down the arm.    Assessment & Plan:   Principal Problem:   Acute on chronic respiratory failure with hypoxia (HCC) Active Problems:   Hyperlipidemia with target LDL less than 130   Severe obesity (BMI >= 40) (HCC)   COPD (chronic obstructive pulmonary disease) (HCC)   Essential hypertension   Type 2 diabetes mellitus with complication, without long-term current use of insulin (HCC)   1-Acute on chronic respiratory failure, secondary to copd exacerbation.  Continue with nebulizer. Change prednisone to IV steroids.  Add tusionex for cough   2-Chest pain; CT angio negative for PE.  Likely related to cough   3-Chronic diastolic HF Dry weight 785 kg Continue with IV lasix.   4-HTN; continue with coreg.    Back pain with radiculopathy not taking her chronic vicodin due to pain clinic issue  History of PE; on eliquis.    DVT prophylaxis: eliquis Code Status: full code Family Communication: care dicussed with patient  Disposition Plan: home when stable.  Consultants:   none   Procedures:   none  Antimicrobials: ceftriaxone  Subjective: She is complaining of productive cough, cough has been bad for week.  Still complaining of dyspnea.   Objective: Vitals:   03/29/18 0054 03/29/18 0714 03/29/18 0800 03/29/18 1503  BP: (!) 141/83  132/89   Pulse: 68 80 66   Resp: 18 18 19    Temp: 97.8 F (36.6 C)  97.6 F (36.4 C)   TempSrc: Oral  Oral   SpO2: 97% 96% 100% 98%  Weight:      Height:        Intake/Output Summary (Last 24 hours) at 03/29/2018 1559 Last data filed at 03/29/2018 1300 Gross per 24 hour  Intake 460 ml  Output 800 ml  Net -340 ml     Filed Weights   03/27/18 1822  Weight: (!) 152.9 kg (337 lb)    Examination:  General exam: Appears calm and comfortable  Respiratory system; bilateral ronchus.  Cardiovascular system: S1 & S2 heard, RRR. No JVD, murmurs, rubs, gallops or clicks. No pedal edema. Gastrointestinal system: Abdomen is nondistended, soft and nontender. No organomegaly or masses felt. Normal bowel sounds heard. Central nervous system: Alert and oriented. No focal neurological deficits. Extremities: Symmetric 5 x 5 power. Skin: No rashes, lesions or ulcers Psychiatry: Judgement and insight appear normal. Mood & affect appropriate.     Data Reviewed: I have personally reviewed following labs and imaging studies  CBC: Recent Labs  Lab 03/27/18 1138 03/28/18 0335 03/29/18 0231  WBC 7.6 7.2 11.5*  NEUTROABS 4.3  --   --   HGB 14.2 14.5 14.1  HCT 48.0* 48.6* 48.0*  MCV 90.9 90.5 91.6  PLT 174 185 885   Basic Metabolic Panel: Recent Labs  Lab 03/27/18 1138 03/28/18 0335 03/29/18 0231  NA 143 140 140  K 4.0 4.3 4.4  CL 107 105 102  CO2 28 27 29   GLUCOSE 109* 145* 107*  BUN 12 17 23   CREATININE 1.00 1.05* 1.21*  CALCIUM 9.2 9.4 9.3  MG  --   --  2.1   GFR: Estimated Creatinine Clearance: 66.1 mL/min (A) (by C-G formula based on  SCr of 1.21 mg/dL (H)). Liver Function Tests: Recent Labs  Lab 03/29/18 0231  AST 17  ALT 13  ALKPHOS 46  BILITOT 0.7  PROT 6.2*  ALBUMIN 3.3*   No results for input(s): LIPASE, AMYLASE in the last 168 hours. No results for input(s): AMMONIA in the last 168 hours. Coagulation Profile: No results for input(s): INR, PROTIME in the last 168 hours. Cardiac Enzymes: No results for input(s): CKTOTAL, CKMB, CKMBINDEX, TROPONINI in the last 168 hours. BNP (last 3 results) No results for input(s): PROBNP in the last 8760 hours. HbA1C: No results for input(s): HGBA1C in the last 72 hours. CBG: No results for input(s): GLUCAP in the last 168 hours. Lipid  Profile: No results for input(s): CHOL, HDL, LDLCALC, TRIG, CHOLHDL, LDLDIRECT in the last 72 hours. Thyroid Function Tests: No results for input(s): TSH, T4TOTAL, FREET4, T3FREE, THYROIDAB in the last 72 hours. Anemia Panel: No results for input(s): VITAMINB12, FOLATE, FERRITIN, TIBC, IRON, RETICCTPCT in the last 72 hours. Sepsis Labs: No results for input(s): PROCALCITON, LATICACIDVEN in the last 168 hours.  No results found for this or any previous visit (from the past 240 hour(s)).       Radiology Studies: No results found.      Scheduled Meds: . apixaban  5 mg Oral BID  . carvedilol  3.125 mg Oral BID WC  . docusate sodium  100 mg Oral BID  . furosemide  40 mg Intravenous Q12H  . gabapentin  400 mg Oral Q lunch   And  . gabapentin  800 mg Oral Q supper   And  . gabapentin  1,200 mg Oral QHS  . ipratropium-albuterol  3 mL Nebulization TID  . methylPREDNISolone (SOLU-MEDROL) injection  60 mg Intravenous Q12H   Continuous Infusions: . cefTRIAXone (ROCEPHIN)  IV 1 g (03/28/18 1905)     LOS: 1 day    Time spent: 35 minutes.     Elmarie Shiley, MD [facility] Pager [phone]  If 7PM-7AM, please contact night-coverage [email or web address] Password [email or web address] 03/29/2018, 3:59 PM

## 2018-03-29 NOTE — Plan of Care (Signed)

## 2018-03-30 LAB — BASIC METABOLIC PANEL
Anion gap: 9 (ref 5–15)
BUN: 30 mg/dL — ABNORMAL HIGH (ref 8–23)
CO2: 31 mmol/L (ref 22–32)
CREATININE: 1.14 mg/dL — AB (ref 0.44–1.00)
Calcium: 9.4 mg/dL (ref 8.9–10.3)
Chloride: 99 mmol/L (ref 98–111)
GFR calc Af Amer: 56 mL/min — ABNORMAL LOW (ref >60)
GFR calc non Af Amer: 48 mL/min — ABNORMAL LOW (ref >60)
Glucose, Bld: 189 mg/dL — ABNORMAL HIGH (ref 70–99)
Potassium: 4.2 mmol/L (ref 3.5–5.1)
SODIUM: 139 mmol/L (ref 135–145)

## 2018-03-30 LAB — TROPONIN I: Troponin I: <0.03 ng/mL (ref <0.03)

## 2018-03-30 NOTE — Progress Notes (Signed)
PROGRESS NOTE    Janice Morrow  VOJ:[id number] DOB: 08-09-1946 DOA: 03/27/2018 PCP: Janith Lima, MD    Brief Narrative: 72 y.o.femalewith a hx oftrigeminal neuralgia; seizures; PVD; OSA on CPAP; HLD; HTN; COPD; PE on Eliquis, and diastolic CHF who presented with SOB and chest pain in the leftshoulderand down the arm.    Assessment & Plan:   Principal Problem:   Acute on chronic respiratory failure with hypoxia (HCC) Active Problems:   Hyperlipidemia with target LDL less than 130   Severe obesity (BMI >= 40) (HCC)   COPD (chronic obstructive pulmonary disease) (HCC)   Essential hypertension   Type 2 diabetes mellitus with complication, without long-term current use of insulin (HCC)   1-Acute on chronic respiratory failure, secondary to copd exacerbation.  Continue with nebulizer. Change prednisone to IV steroids.  Continue with tusionex  Feels better , not at baseline.   2-Chest pain; CT angio negative for PE.  Likely related to cough   3-Chronic diastolic HF; Dry weight 993 kg Continue with IV lasix.  Repeat renal function  Weight today 154---  4-HTN; continue with coreg.    Back pain with radiculopathy not taking her chronic vicodin due to pain clinic issue  History of PE; on eliquis.    DVT prophylaxis: eliquis Code Status: full code Family Communication: care dicussed with patient  Disposition Plan: home when stable.  Consultants:   none   Procedures:   none  Antimicrobials: ceftriaxone  Subjective: She is breathing better, still complaining of shoulder pain, gets cramps.    Objective: Vitals:   03/30/18 0500 03/30/18 0749 03/30/18 0751 03/30/18 1431  BP:  119/74    Pulse:  68    Resp:  18    Temp:  98.2 F (36.8 C)    TempSrc:  Oral    SpO2:  92% 92% 95%  Weight: (!) 154.4 kg (340 lb 6.2 oz)     Height:        Intake/Output Summary (Last 24 hours) at 03/30/2018 1505 Last data filed at 03/30/2018 0800 Gross per 24 hour    Intake 580 ml  Output 1000 ml  Net -420 ml   Filed Weights   03/27/18 1822 03/30/18 0500  Weight: (!) 152.9 kg (337 lb) (!) 154.4 kg (340 lb 6.2 oz)    Examination:  General exam: NAD Respiratory system; Bilateral crackles.  Cardiovascular system: S 1, S 2 RRR Gastrointestinal system: BS present, soft, nt Central nervous system: non focal.  Extremities: plus 1 edema.      Data Reviewed: I have personally reviewed following labs and imaging studies  CBC: Recent Labs  Lab 03/27/18 1138 03/28/18 0335 03/29/18 0231  WBC 7.6 7.2 11.5*  NEUTROABS 4.3  --   --   HGB 14.2 14.5 14.1  HCT 48.0* 48.6* 48.0*  MCV 90.9 90.5 91.6  PLT 174 185 716   Basic Metabolic Panel: Recent Labs  Lab 03/27/18 1138 03/28/18 0335 03/29/18 0231 03/30/18 0731  NA 143 140 140 139  K 4.0 4.3 4.4 4.2  CL 107 105 102 99  CO2 28 27 29 31   GLUCOSE 109* 145* 107* 189*  BUN 12 17 23  30*  CREATININE 1.00 1.05* 1.21* 1.14*  CALCIUM 9.2 9.4 9.3 9.4  MG  --   --  2.1  --    GFR: Estimated Creatinine Clearance: 70.6 mL/min (A) (by C-G formula based on SCr of 1.14 mg/dL (H)). Liver Function Tests: Recent Labs  Lab 03/29/18 0231  AST 17  ALT 13  ALKPHOS 46  BILITOT 0.7  PROT 6.2*  ALBUMIN 3.3*   No results for input(s): LIPASE, AMYLASE in the last 168 hours. No results for input(s): AMMONIA in the last 168 hours. Coagulation Profile: No results for input(s): INR, PROTIME in the last 168 hours. Cardiac Enzymes: No results for input(s): CKTOTAL, CKMB, CKMBINDEX, TROPONINI in the last 168 hours. BNP (last 3 results) No results for input(s): PROBNP in the last 8760 hours. HbA1C: No results for input(s): HGBA1C in the last 72 hours. CBG: No results for input(s): GLUCAP in the last 168 hours. Lipid Profile: No results for input(s): CHOL, HDL, LDLCALC, TRIG, CHOLHDL, LDLDIRECT in the last 72 hours. Thyroid Function Tests: No results for input(s): TSH, T4TOTAL, FREET4, T3FREE, THYROIDAB  in the last 72 hours. Anemia Panel: No results for input(s): VITAMINB12, FOLATE, FERRITIN, TIBC, IRON, RETICCTPCT in the last 72 hours. Sepsis Labs: No results for input(s): PROCALCITON, LATICACIDVEN in the last 168 hours.  No results found for this or any previous visit (from the past 240 hour(s)).       Radiology Studies: No results found.      Scheduled Meds: . apixaban  5 mg Oral BID  . carvedilol  3.125 mg Oral BID WC  . docusate sodium  100 mg Oral BID  . furosemide  40 mg Intravenous Q12H  . gabapentin  400 mg Oral Q lunch   And  . gabapentin  800 mg Oral Q supper   And  . gabapentin  1,200 mg Oral QHS  . ipratropium-albuterol  3 mL Nebulization TID  . methylPREDNISolone (SOLU-MEDROL) injection  60 mg Intravenous Q12H   Continuous Infusions: . cefTRIAXone (ROCEPHIN)  IV 1 g (03/29/18 1835)     LOS: 2 days    Time spent: 35 minutes.     Elmarie Shiley, MD [facility] Pager [phone]  If 7PM-7AM, please contact night-coverage [email or web address] Password [email or web address] 03/30/2018, 3:05 PM

## 2018-03-31 LAB — BASIC METABOLIC PANEL
ANION GAP: 13 (ref 5–15)
Anion gap: 9 (ref 5–15)
BUN: 35 mg/dL — ABNORMAL HIGH (ref 8–23)
BUN: 37 mg/dL — ABNORMAL HIGH (ref 8–23)
CHLORIDE: 98 mmol/L (ref 98–111)
CO2: 32 mmol/L (ref 22–32)
CO2: 30 mmol/L (ref 22–32)
CREATININE: 1.35 mg/dL — AB (ref 0.44–1.00)
CREATININE: 1.21 mg/dL — AB (ref 0.44–1.00)
Calcium: 9.5 mg/dL (ref 8.9–10.3)
Calcium: 9.7 mg/dL (ref 8.9–10.3)
Chloride: 94 mmol/L — ABNORMAL LOW (ref 98–111)
GFR, EST AFRICAN AMERICAN: 45 mL/min — AB (ref >60)
GFR, EST AFRICAN AMERICAN: 52 mL/min — AB (ref >60)
GFR, EST NON AFRICAN AMERICAN: 39 mL/min — AB (ref >60)
GFR, EST NON AFRICAN AMERICAN: 45 mL/min — AB (ref >60)
GLUCOSE: 155 mg/dL — AB (ref 70–99)
Glucose, Bld: 219 mg/dL — ABNORMAL HIGH (ref 70–99)
POTASSIUM: 5.3 mmol/L — AB (ref 3.5–5.1)
Potassium: 5.3 mmol/L — ABNORMAL HIGH (ref 3.5–5.1)
SODIUM: 139 mmol/L (ref 135–145)
Sodium: 137 mmol/L (ref 135–145)

## 2018-03-31 LAB — TROPONIN I

## 2018-03-31 MED ORDER — SODIUM POLYSTYRENE SULFONATE 15 GM/60ML PO SUSP
30.0000 g | Freq: Once | ORAL | Status: AC
  Administered 2018-03-31: 30 g via ORAL
  Filled 2018-03-31: qty 120

## 2018-03-31 MED ORDER — FAMOTIDINE 20 MG PO TABS
20.0000 mg | ORAL_TABLET | Freq: Every day | ORAL | Status: DC
  Administered 2018-04-01: 20 mg via ORAL
  Filled 2018-03-31 (×2): qty 1

## 2018-03-31 MED ORDER — FUROSEMIDE 40 MG PO TABS
40.0000 mg | ORAL_TABLET | Freq: Every day | ORAL | Status: DC
  Administered 2018-04-01: 40 mg via ORAL
  Filled 2018-03-31: qty 1

## 2018-03-31 NOTE — Progress Notes (Signed)
Physical Therapy Treatment Patient Details Name: Janice Morrow MRN: [id number] DOB: 23-Jul-1946 Today's Date: 03/31/2018    History of Present Illness Pt arrived via [facility]; pt from [facility] with c/o CP x 2 days; pt states that its ever changing, coming and going; central chest, states it radiates to left arm; movement and coughing makes it worse;    PT Comments    Pt progressing toward PT goals, incr amb distance today(probably close to her baseline); she continues to decline SNF, recommend HHPT at d/c; will continue to follow   Follow Up Recommendations  Home health PT;Supervision for mobility/OOB(pt refusing SNF)     Equipment Recommendations  3in1 (PT)(bariatric)    Recommendations for Other Services       Precautions / Restrictions Precautions Precautions: Fall Precaution Comments: morbid obesity Restrictions Weight Bearing Restrictions: No    Mobility  Bed Mobility   Bed Mobility: Supine to Sit;Sit to Supine     Supine to sit: Supervision;HOB elevated Sit to supine: Min assist   General bed mobility comments: assist to bring LEs onto bed, incr time; no assist needed for supine to sit, supervision for safety  Transfers Overall transfer level: Needs assistance Equipment used: Rolling walker (2 wheeled) Transfers: Sit to/from Stand Sit to Stand: Supervision         General transfer comment: for safety  Ambulation/Gait Ambulation/Gait assistance: Supervision;Min guard Gait Distance (Feet): 26 Feet(in room, pt declines to go outside room) Assistive device: Rolling walker (2 wheeled) Gait Pattern/deviations: Step-through pattern;Decreased stride length Gait velocity: decr   General Gait Details: pt leans forearms on RW (she is not amenable to placing hands on RW), min/guard to supervision for safety, slow gait, no LOB--fatigues easily    Stairs             Wheelchair Mobility    Modified Rankin (Stroke Patients Only)       Balance                                             Cognition Arousal/Alertness: Awake/alert Behavior During Therapy: WFL for tasks assessed/performed Overall Cognitive Status: Within Functional Limits for tasks assessed                                        Exercises      General Comments        Pertinent Vitals/Pain Pain Assessment: No/denies pain    Home Living                      Prior Function            PT Goals (current goals can now be found in the care plan section) Acute Rehab PT Goals Patient Stated Goal: go home, not to rehab PT Goal Formulation: With patient Time For Goal Achievement: 04/11/18 Potential to Achieve Goals: Fair Progress towards PT goals: Progressing toward goals    Frequency    Min 3X/week      PT Plan Current plan remains appropriate;Discharge plan needs to be updated    Co-evaluation              AM-PAC PT "6 Clicks" Daily Activity  Outcome Measure  Difficulty turning over in bed (including adjusting bedclothes, sheets and blankets)?: A Little  Difficulty moving from lying on back to sitting on the side of the bed? : A Little Difficulty sitting down on and standing up from a chair with arms (e.g., wheelchair, bedside commode, etc,.)?: Unable Help needed moving to and from a bed to chair (including a wheelchair)?: A Little Help needed walking in hospital room?: A Little Help needed climbing 3-5 steps with a railing? : A Lot 6 Click Score: 15    End of Session Equipment Utilized During Treatment: Oxygen(refuses gait belt) Activity Tolerance: Patient tolerated treatment well Patient left: in bed;with call bell/phone within reach(alarm not set on arrival) Nurse Communication: Mobility status PT Visit Diagnosis: Unsteadiness on feet (R26.81);Other abnormalities of gait and mobility (R26.89);Muscle weakness (generalized) (M62.81);Difficulty in walking, not elsewhere classified (R26.2)     Time:  1030-1053 PT Time Calculation (min) (ACUTE ONLY): 23 min  Charges:  $Gait Training: 23-37 mins                     Kenyon Ana, [place] Pager: [phone] 03/31/2018    [facility] 03/31/2018, 2:38 PM

## 2018-03-31 NOTE — Progress Notes (Signed)
Pt had episode at beginning of shift where she stated she had " elephants on her chest" and the pain was running "through her chest and up her throat". Pt had on and off suggestions of this on day shift. To be safe RN  Paged MD and got EKG and cycled trops. EKG in chart - as soon as issues was addressed pt stated that it went away completely. Pt has very anxious behaviors. Attempted to get pt OOB last night and was only able to stand in place for about 1 min and take 3 steps before she felt like her "knee was going to give out"   0630- Pt rested well with no issues - Pt is stating that she is planning on going home today.

## 2018-03-31 NOTE — Progress Notes (Addendum)
PROGRESS NOTE    SHARESA Morrow  NAT:[id number] DOB: [birth year] DOA: 03/27/2018 PCP: Janith Lima, MD    Brief Narrative: 72 y.o.femalewith a hx oftrigeminal neuralgia; seizures; PVD; OSA on CPAP; HLD; HTN; COPD; PE on Eliquis, and diastolic CHF who presented with SOB and chest pain in the leftshoulderand down the arm.    Assessment & Plan:   Principal Problem:   Acute on chronic respiratory failure with hypoxia (HCC) Active Problems:   Hyperlipidemia with target LDL less than 130   Severe obesity (BMI >= 40) (HCC)   COPD (chronic obstructive pulmonary disease) (HCC)   Essential hypertension   Type 2 diabetes mellitus with complication, without long-term current use of insulin (HCC)   1-Acute on chronic respiratory failure, secondary to copd exacerbation.  Continue with nebulizer. Change prednisone to IV steroids.  Continue with tusionex  Feels better , not at baseline.   2-Chest pain; CT angio negative for PE.  Likely related to cough  Had another episode of chest spasm. Has had that pain for years. Her cardiology is aware.  Had cath 09-2017-- minimal CAD.  Troponin times 3 negative.   3-Chronic diastolic HF; Dry weight 427 kg Change lasix to oral.  Repeat renal function  Weight today 154---  4-HTN; continue with coreg.    Back pain with radiculopathy not taking her chronic vicodin due to pain clinic issue  History of PE; on eliquis.   Hyperkalemia; will give dose of kayexalate    DVT prophylaxis: eliquis Code Status: full code Family Communication: care dicussed with patient  Disposition Plan: home when stable.  Consultants:   none   Procedures:   none  Antimicrobials: ceftriaxone  Subjective: She had an episode of chest pain, cramps chest   Objective: Vitals:   03/31/18 1205 03/31/18 1326 03/31/18 1545 03/31/18 1614  BP: (!) 108/59   (!) 128/59  Pulse: 70   68  Resp: 20   20  Temp:    98.4 F (36.9 C)  TempSrc:    Oral  SpO2:  97% 96% 93% 94%  Weight:      Height:        Intake/Output Summary (Last 24 hours) at 03/31/2018 1732 Last data filed at 03/31/2018 0844 Gross per 24 hour  Intake 540 ml  Output 1600 ml  Net -1060 ml   Filed Weights   03/27/18 1822 03/30/18 0500 03/31/18 0337  Weight: (!) 152.9 kg (337 lb) (!) 154.4 kg (340 lb 6.2 oz) (!) 153 kg (337 lb 4.9 oz)    Examination:  General exam: NAD Respiratory system; Normal respiratory effort.  Cardiovascular system: S 1, S 2 RRR Gastrointestinal system: BS present , soft,  Central nervous system: non focal  Extremities: plus 1 edema.      Data Reviewed: I have personally reviewed following labs and imaging studies  CBC: Recent Labs  Lab 03/27/18 1138 03/28/18 0335 03/29/18 0231  WBC 7.6 7.2 11.5*  NEUTROABS 4.3  --   --   HGB 14.2 14.5 14.1  HCT 48.0* 48.6* 48.0*  MCV 90.9 90.5 91.6  PLT 174 185 062   Basic Metabolic Panel: Recent Labs  Lab 03/28/18 0335 03/29/18 0231 03/30/18 0731 03/31/18 0305 03/31/18 1232  NA 140 140 139 139 137  K 4.3 4.4 4.2 5.3* 5.3*  CL 105 102 99 98 94*  CO2 27 29 31  32 30  GLUCOSE 145* 107* 189* 219* 155*  BUN 17 23 30* 35* 37*  CREATININE 1.05* 1.21* 1.14*  1.35* 1.21*  CALCIUM 9.4 9.3 9.4 9.5 9.7  MG  --  2.1  --   --   --    GFR: Estimated Creatinine Clearance: 66.1 mL/min (A) (by C-G formula based on SCr of 1.21 mg/dL (H)). Liver Function Tests: Recent Labs  Lab 03/29/18 0231  AST 17  ALT 13  ALKPHOS 46  BILITOT 0.7  PROT 6.2*  ALBUMIN 3.3*   No results for input(s): LIPASE, AMYLASE in the last 168 hours. No results for input(s): AMMONIA in the last 168 hours. Coagulation Profile: No results for input(s): INR, PROTIME in the last 168 hours. Cardiac Enzymes: Recent Labs  Lab 03/30/18 2035 03/31/18 0305 03/31/18 0856  TROPONINI <0.03 <0.03 <0.03   BNP (last 3 results) No results for input(s): PROBNP in the last 8760 hours. HbA1C: No results for input(s): HGBA1C in the  last 72 hours. CBG: No results for input(s): GLUCAP in the last 168 hours. Lipid Profile: No results for input(s): CHOL, HDL, LDLCALC, TRIG, CHOLHDL, LDLDIRECT in the last 72 hours. Thyroid Function Tests: No results for input(s): TSH, T4TOTAL, FREET4, T3FREE, THYROIDAB in the last 72 hours. Anemia Panel: No results for input(s): VITAMINB12, FOLATE, FERRITIN, TIBC, IRON, RETICCTPCT in the last 72 hours. Sepsis Labs: No results for input(s): PROCALCITON, LATICACIDVEN in the last 168 hours.  No results found for this or any previous visit (from the past 240 hour(s)).       Radiology Studies: No results found.      Scheduled Meds: . apixaban  5 mg Oral BID  . carvedilol  3.125 mg Oral BID WC  . docusate sodium  100 mg Oral BID  . famotidine  20 mg Oral Daily  . gabapentin  400 mg Oral Q lunch   And  . gabapentin  800 mg Oral Q supper   And  . gabapentin  1,200 mg Oral QHS  . ipratropium-albuterol  3 mL Nebulization TID  . methylPREDNISolone (SOLU-MEDROL) injection  60 mg Intravenous Q12H  . sodium polystyrene  30 g Oral Once   Continuous Infusions: . cefTRIAXone (ROCEPHIN)  IV 1 g (03/30/18 1957)     LOS: 3 days    Time spent: 35 minutes.     Elmarie Shiley, MD [facility] Pager [phone]  If 7PM-7AM, please contact night-coverage [email or web address] Password [facility] 03/31/2018, 5:32 PM

## 2018-03-31 NOTE — Progress Notes (Signed)
Occupational Therapy Treatment Patient Details Name: Janice Morrow MRN: [id number] DOB: [birth year] Today's Date: 03/31/2018    History of present illness Pt arrived via [facility]; pt from [facility] with c/o CP x 2 days; pt states that its ever changing, coming and going; central chest, states it radiates to left arm; movement and coughing makes it worse;   OT comments  Pt educated on AE for LB selfcare and receptive to use.  Did not attempt OOB however as pt declined, stating she had been up earlier with PT during their session.  Will continue to follow acutely for OT needs but pt refuses HHOT at this time.    Follow Up Recommendations  No OT follow up(Pt declines )    Equipment Recommendations  Other (comment)(AE for LB selfcare)       Precautions / Restrictions Precautions Precautions: Fall Precaution Comments: morbid obesity Restrictions Weight Bearing Restrictions: No       Mobility Bed Mobility   Bed Mobility: Supine to Sit;Sit to Supine     Supine to sit: Supervision;HOB elevated Sit to supine: Supervision(HOB elevated 45 degrees)   General bed mobility comments: assist to bring LEs onto bed, incr time; no assist needed for supine to sit, supervision for safety  Transfers Overall transfer level: Needs assistance Equipment used: Rolling walker (2 wheeled) Transfers: Sit to/from Stand Sit to Stand: Supervision         General transfer comment: for safety    Balance Overall balance assessment: Needs assistance Sitting-balance support: No upper extremity supported;Feet supported Sitting balance-Leahy Scale: Good                                     ADL either performed or assessed with clinical judgement   ADL Overall ADL's : Needs assistance/impaired                     Lower Body Dressing: Min guard;Sitting/lateral leans Lower Body Dressing Details (indicate cue type and reason): Pt educated on use of the reacher and sockaide.  She was able  to demonstrate use with min instructional cueing.                 General ADL Comments: Issued reacher, sockaide, and LH shoe horn to use for LB selfcare as pt demonstrates flexibility issues as well as endurance issues.  Pt declined getting OOB as she got out earlier during PT.                 Cognition Arousal/Alertness: Awake/alert Behavior During Therapy: Agitated(Initially agitated but became more appropriate with therapeutic listenening from therapist) Overall Cognitive Status: Within Functional Limits for tasks assessed                                                     Pertinent Vitals/ Pain       Pain Assessment: No/denies pain            Progress Toward Goals  OT Goals(current goals can now be found in the care plan section)  Progress towards OT goals: Progressing toward goals  Acute Rehab OT Goals Patient Stated Goal: go home, not to rehab  Plan Discharge plan remains appropriate       AM-PAC PT "6 Clicks"  Daily Activity     Outcome Measure   Help from another person eating meals?: None Help from another person taking care of personal grooming?: None Help from another person toileting, which includes using toliet, bedpan, or urinal?: A Little Help from another person bathing (including washing, rinsing, drying)?: A Little Help from another person to put on and taking off regular upper body clothing?: None Help from another person to put on and taking off regular lower body clothing?: A Little 6 Click Score: 21    End of Session Equipment Utilized During Treatment: Other (comment)(AE (reacher, LH shoe horn, sockaide)  OT Visit Diagnosis: Muscle weakness (generalized) (M62.81)   Activity Tolerance Other (comment)(Pt self limiting session)   Patient Left in bed;with call bell/phone within reach           Time: 2119-4174 OT Time Calculation (min): 22 min  Charges: OT General Charges $OT Visit: 1 Visit OT  Treatments $Self Care/Home Management : 8-22 mins   Conrado Nance OTR/L 03/31/2018, 3:55 PM

## 2018-03-31 NOTE — Care Management Important Message (Signed)
Important Message  Patient Details  Name: Janice Morrow MRN: [id number] Date of Birth: [birth year]   Medicare Important Message Given:  Yes    Orbie Pyo 03/31/2018, 4:15 PM

## 2018-03-31 NOTE — Progress Notes (Signed)
Patient refusing her Pepcid, stating she does not have acid reflux.  Did not want any further comment or instruction and as patient was escalating, marked refused at this time and will try to address education at a later time.

## 2018-04-01 LAB — BASIC METABOLIC PANEL
ANION GAP: 11 (ref 5–15)
BUN: 40 mg/dL — ABNORMAL HIGH (ref 8–23)
CALCIUM: 9.5 mg/dL (ref 8.9–10.3)
CO2: 35 mmol/L — AB (ref 22–32)
CREATININE: 1.17 mg/dL — AB (ref 0.44–1.00)
Chloride: 93 mmol/L — ABNORMAL LOW (ref 98–111)
GFR, EST AFRICAN AMERICAN: 54 mL/min — AB (ref >60)
GFR, EST NON AFRICAN AMERICAN: 46 mL/min — AB (ref >60)
Glucose, Bld: 186 mg/dL — ABNORMAL HIGH (ref 70–99)
Potassium: 4.7 mmol/L (ref 3.5–5.1)
Sodium: 139 mmol/L (ref 135–145)

## 2018-04-01 MED ORDER — FAMOTIDINE 20 MG PO TABS: 20.0000 mg | ORAL_TABLET | Freq: Every day | ORAL | 0 refills | Status: DC

## 2018-04-01 MED ORDER — SODIUM POLYSTYRENE SULFONATE 15 GM/60ML PO SUSP
30.0000 g | Freq: Once | ORAL | Status: AC
  Administered 2018-04-01: 30 g via ORAL
  Filled 2018-04-01: qty 120

## 2018-04-01 MED ORDER — IPRATROPIUM-ALBUTEROL 0.5-2.5 (3) MG/3ML IN SOLN: 3.0000 mL | Freq: Three times a day (TID) | RESPIRATORY_TRACT | 0 refills | Status: DC

## 2018-04-01 MED ORDER — DOCUSATE SODIUM 100 MG PO CAPS: 100.0000 mg | ORAL_CAPSULE | Freq: Two times a day (BID) | ORAL | 0 refills | Status: DC

## 2018-04-01 MED ORDER — GUAIFENESIN-CODEINE 100-10 MG/5ML PO SOLN: 5.0000 mL | Freq: Four times a day (QID) | ORAL | 0 refills | Status: DC | PRN

## 2018-04-01 MED ORDER — PREDNISONE 20 MG PO TABS: 40.0000 mg | ORAL_TABLET | Freq: Every day | ORAL | 0 refills | Status: DC

## 2018-04-01 MED ORDER — PREDNISONE 20 MG PO TABS
40.0000 mg | ORAL_TABLET | Freq: Every day | ORAL | Status: DC
  Administered 2018-04-01: 40 mg via ORAL
  Filled 2018-04-01: qty 2

## 2018-04-01 NOTE — Discharge Summary (Signed)
Physician Discharge Summary  Janice Morrow GGE:[id number] DOB: Jul 29, 1946 DOA: 03/27/2018  PCP: Janice Lima, MD  Admit date: 03/27/2018 Discharge date: 04/01/2018  Admitted From: Home  Disposition: Home   Recommendations for Outpatient Follow-up:  1. Follow up with PCP in 1-2 weeks 2. Please obtain BMP/CBC in one week 3.   Home Health: yes.   Discharge Condition: Stable.  CODE STATUS: Full code.  Diet recommendation: Heart Healthy   Brief/Interim Summary:  Brief Narrative: 72 y.o.femalewitha hxoftrigeminal neuralgia; seizures; PVD; OSA on CPAP; HLD; HTN; COPD; PE on Eliquis,and diastolic CHF who presentedwith SOB and chest pain in the leftshoulderand down the arm.   Assessment & Plan:   Principal Problem:   Acute on chronic respiratory failure with hypoxia (HCC) Active Problems:   Hyperlipidemia with target LDL less than 130   Severe obesity (BMI >= 40) (HCC)   COPD (chronic obstructive pulmonary disease) (HCC)   Essential hypertension   Type 2 diabetes mellitus with complication, without long-term current use of insulin (HCC)   1-Acute on chronic respiratory failure, secondary to copd exacerbation.  Continue with nebulizer. Change prednisone to IV steroids.  Continue with tusionex  Breathing better, at baseline.    2-Chest pain; CT angio negative for PE.  Likely related to cough. Pleuresis.  Had another episode of chest spasm. Has had that pain for years. Her cardiology is aware.  Had cath 09-2017-- minimal CAD.  Troponin times 3 negative.  resolve.   3-Chronic diastolic HF; Change lasix to oral.  Repeat renal function  Weight today 154---153 negative 3 L.  change lasix to oral. Renal function stable.   4-HTN; continue with coreg.    Back pain with radiculopathy not taking her chronic vicodin due to pain clinic issue  History of PE; on eliquis.   Hyperkalemia; will give dose of kayexalate  if k normal plan  to discharge patient  today.     Discharge Diagnoses:  Principal Problem:   Acute on chronic respiratory failure with hypoxia (HCC) Active Problems:   Hyperlipidemia with target LDL less than 130   Severe obesity (BMI >= 40) (HCC)   COPD (chronic obstructive pulmonary disease) ([name])   Essential hypertension   Type 2 diabetes mellitus with complication, without long-term current use of insulin ([name])    Discharge Instructions   Allergies as of 04/01/2018      Reactions   Baclofen Other (See Comments)   Causes seizures   Imdur [isosorbide Dinitrate] Other (See Comments)   Severe persistent headache   Naproxen Palpitations      Medication List    TAKE these medications   carvedilol 3.125 MG tablet Commonly known as:  COREG TAKE 1 TABLET BY MOUTH 2 TIMES DAILY WITH A MEAL   docusate sodium 100 MG capsule Commonly known as:  COLACE Take 1 capsule (100 mg total) by mouth 2 (two) times daily.   ELIQUIS 5 MG Tabs tablet Generic drug:  apixaban TAKE 1 TABLET BY MOUTH TWICE A DAY   famotidine 20 MG tablet Commonly known as:  PEPCID Take 1 tablet (20 mg total) by mouth daily. Start taking on:  04/02/2018   furosemide 40 MG tablet Commonly known as:  LASIX Take 1 tablet (40 mg total) by mouth daily.   gabapentin 400 MG capsule Commonly known as:  NEURONTIN Take 400-1,200 mg by mouth See admin instructions. TAKE 1 CAPSULE AT LUNCH, TAKE 2 CAPSULES AT SUPPER AND 3 CAPSULES AT BEDTIME   guaiFENesin-codeine 100-10 MG/5ML syrup Take  5 mLs by mouth every 6 (six) hours as needed for cough.   HYDROcodone-acetaminophen 10-325 MG tablet Commonly known as:  NORCO Take 1 tablet by mouth every 6 (six) hours as needed.   ipratropium-albuterol 0.5-2.5 (3) MG/3ML Soln Commonly known as:  DUONEB Take 3 mLs by nebulization 3 (three) times daily.   OXYGEN Inhale 4 L into the lungs.   predniSONE 20 MG tablet Commonly known as:  DELTASONE Take 2 tablets (40 mg total) by mouth daily with breakfast. Start  taking on:  04/02/2018   tiZANidine 2 MG tablet Commonly known as:  ZANAFLEX TAKE 1 TABLET (2 MG TOTAL) BY MOUTH EVERY 8 (EIGHT) HOURS AS NEEDED. FOR MIGRAINE   umeclidinium-vilanterol 62.5-25 MCG/INH Aepb Commonly known as:  ANORO ELLIPTA Inhale 1 puff into the lungs daily.   zolpidem 5 MG tablet Commonly known as:  AMBIEN Take 1 tablet (5 mg total) by mouth at bedtime as needed for sleep.            Durable Medical Equipment  (From admission, onward)        Start     Ordered   04/01/18 1616  For home use only DME Nebulizer machine  Once    Question:  Patient needs a nebulizer to treat with the following condition  Answer:  COPD (chronic obstructive pulmonary disease) ([place])   04/01/18 1615   04/01/18 1405  For home use only DME Other see comment  Once    Comments:  Titrate for POC at home (Evaluate)   04/01/18 1405   04/01/18 1404  For home use only DME Nebulizer machine  Once    Question:  Patient needs a nebulizer to treat with the following condition  Answer:  COPD (chronic obstructive pulmonary disease) ([place])   04/01/18 1404   04/01/18 1352  For home use only DME Other see comment  Once     04/01/18 1352     Follow-up Information    [facility] Follow up.   Why:  neb machine , they will evaluate (titrate for poc) Contact information: [address] [place] [address] [phone]        [facility] Follow up.   Specialty:  Home Health Services Why:  [place] information: [place] [address] [phone]          Allergies  Allergen Reactions  . Baclofen Other (See Comments)    Causes seizures  . Imdur [Isosorbide Dinitrate] Other (See Comments)    Severe persistent headache  . Naproxen Palpitations    Consultations:  none   Procedures/Studies: Dg Chest 2 View  Result Date: 03/27/2018 CLINICAL DATA:  CP x 2 days; pt states that its ever changing, coming and going; central chest, states  it radiates to left arm; movement and coughing makes it worse; cough for 1 month EXAM: CHEST - 2 VIEW COMPARISON:  10/24/2017, chest CT 10/24/2017 FINDINGS: Heart size is accentuated by the AP technique but is mildly enlarged. There is prominence of the pulmonary arteries, stable in appearance. There are no focal consolidations or pleural effusions. No evidence for pulmonary edema. Persistent atelectasis or scarring in the lingula and RIGHT middle lobe. IMPRESSION: 1. Mildly enlarged heart.  No evidence for edema. 2. Prominent pulmonary arteries, consistent with pulmonary arterial hypertension. 3. Stable scarring or atelectasis in the RIGHT middle lobe and lingula. Electronically Signed   By: Nolon Nations M.D.   On: 03/27/2018 11:10   Ct  Angio Chest Pe W/cm &/or Wo Cm  Result Date: 03/27/2018 CLINICAL DATA:  Chest pain, 2 days duration. Symptoms are intermittent. Radiates to the left arm. EXAM: CT ANGIOGRAPHY CHEST WITH CONTRAST TECHNIQUE: Multidetector CT imaging of the chest was performed using the standard protocol during bolus administration of intravenous contrast. Multiplanar CT image reconstructions and MIPs were obtained to evaluate the vascular anatomy. CONTRAST:  64mL ISOVUE-370 IOPAMIDOL (ISOVUE-370) INJECTION 76% COMPARISON:  Radiography same day.  CT 10/24/2017. FINDINGS: Cardiovascular: Pulmonary arterial opacification is good. There are no pulmonary emboli. Aortic atherosclerotic calcification is noted. Heart is mildly enlarged. No coronary artery calcification is seen. Mediastinum/Nodes: Normal Lungs/Pleura: The lungs are clear except for mild patchy atelectasis at the lung bases right more than left. No pleural fluid. Upper Abdomen: Renal calculi on the left. Musculoskeletal: Ordinary degenerative changes affect the spine. Review of the MIP images confirms the above findings. IMPRESSION: No pulmonary emboli. Mild cardiomegaly and aortic atherosclerosis. Mild atelectasis at the lung bases.  No  other active finding. Electronically Signed   By: Nelson Chimes M.D.   On: 03/27/2018 15:35     Subjective: Feeling better, willing to go home   Discharge Exam: Vitals:   04/01/18 0848 04/01/18 1318  BP: 140/90   Pulse:  65  Resp:  20  Temp:    SpO2:  96%   Vitals:   04/01/18 0745 04/01/18 0752 04/01/18 0848 04/01/18 1318  BP:  (!) 156/88 140/90   Pulse: 70 64  65  Resp: 20 20  20   Temp:  97.7 F (36.5 C)    TempSrc:  Oral    SpO2: 97% 97%  96%  Weight:      Height:        General: Pt is alert, awake, not in acute distress Cardiovascular: RRR, S1/S2 +, no rubs, no gallops Respiratory: CTA bilaterally, no wheezing, no rhonchi Abdominal: Soft, NT, ND, bowel sounds + Extremities: no edema, no cyanosis    The results of significant diagnostics from this hospitalization (including imaging, microbiology, ancillary and laboratory) are listed below for reference.     Microbiology: No results found for this or any previous visit (from the past 240 hour(s)).   Labs: BNP (last 3 results) Recent Labs    10/24/17 1754 03/27/18 1138  BNP 183.1* 46.5   Basic Metabolic Panel: Recent Labs  Lab 03/28/18 0335 03/29/18 0231 03/30/18 0731 03/31/18 0305 03/31/18 1232  NA 140 140 139 139 137  K 4.3 4.4 4.2 5.3* 5.3*  CL 105 102 99 98 94*  CO2 27 29 31  32 30  GLUCOSE 145* 107* 189* 219* 155*  BUN 17 23 30* 35* 37*  CREATININE 1.05* 1.21* 1.14* 1.35* 1.21*  CALCIUM 9.4 9.3 9.4 9.5 9.7  MG  --  2.1  --   --   --    Liver Function Tests: Recent Labs  Lab 03/29/18 0231  AST 17  ALT 13  ALKPHOS 46  BILITOT 0.7  PROT 6.2*  ALBUMIN 3.3*   No results for input(s): LIPASE, AMYLASE in the last 168 hours. No results for input(s): AMMONIA in the last 168 hours. CBC: Recent Labs  Lab 03/27/18 1138 03/28/18 0335 03/29/18 0231  WBC 7.6 7.2 11.5*  NEUTROABS 4.3  --   --   HGB 14.2 14.5 14.1  HCT 48.0* 48.6* 48.0*  MCV 90.9 90.5 91.6  PLT 174 185 184   Cardiac  Enzymes: Recent Labs  Lab 03/30/18 2035 03/31/18 0305 03/31/18 0856  TROPONINI <  0.03 <0.03 <0.03   BNP: Invalid input(s): POCBNP CBG: No results for input(s): GLUCAP in the last 168 hours. D-Dimer No results for input(s): DDIMER in the last 72 hours. Hgb A1c No results for input(s): HGBA1C in the last 72 hours. Lipid Profile No results for input(s): CHOL, HDL, LDLCALC, TRIG, CHOLHDL, LDLDIRECT in the last 72 hours. Thyroid function studies No results for input(s): TSH, T4TOTAL, T3FREE, THYROIDAB in the last 72 hours.  Invalid input(s): FREET3 Anemia work up No results for input(s): VITAMINB12, FOLATE, FERRITIN, TIBC, IRON, RETICCTPCT in the last 72 hours. Urinalysis    Component Value Date/Time   COLORURINE AMBER (A) 10/01/2017 0318   APPEARANCEUR HAZY (A) 10/01/2017 0318   LABSPEC 1.024 10/01/2017 0318   PHURINE 5.0 10/01/2017 0318   GLUCOSEU NEGATIVE 10/01/2017 0318   GLUCOSEU NEGATIVE 05/20/2017 1705   HGBUR SMALL (A) 10/01/2017 0318   BILIRUBINUR NEGATIVE 10/01/2017 0318   KETONESUR NEGATIVE 10/01/2017 0318   PROTEINUR 30 (A) 10/01/2017 0318   UROBILINOGEN 0.2 05/20/2017 1705   NITRITE NEGATIVE 10/01/2017 0318   LEUKOCYTESUR MODERATE (A) 10/01/2017 0318   Sepsis Labs Invalid input(s): PROCALCITONIN,  WBC,  LACTICIDVEN Microbiology No results found for this or any previous visit (from the past 240 hour(s)).   Time coordinating discharge: 35 minutes.  SIGNED:   Elmarie Shiley, MD  [facility] 04/01/2018, 4:16 PM Pager   If 7PM-7AM, please contact night-coverage [email or web address] Password [email or web address]

## 2018-04-02 ENCOUNTER — Telehealth: Payer: Self-pay | Admitting: *Deleted

## 2018-04-02 NOTE — Telephone Encounter (Signed)
Transition Care Management Follow-up Telephone Call   Date discharged? 04/01/18   How have you been since you were released from the hospital? Call pt spoke w/brother [name] was there with him. She states she is doing alright   Do you understand why you were in the hospital? YES   Do you understand the discharge instructions? YES   Where were you discharged to? Home   Items Reviewed:  Medications reviewed: YES  Allergies reviewed: YES  Dietary changes reviewed: YES, carb modified  Referrals reviewed: YES   Functional Questionnaire:   Activities of Daily Living (ADLs):   He states she are independent in the following: bathing and hygiene, feeding, continence, grooming, toileting and dressing States they require assistance with the following: ambulation   Any transportation issues/concerns?: NO   Any patient concerns? NO   Confirmed importance and date/time of follow-up visits scheduled YES, appt 04/14/18  Provider Appointment booked with Dr. Ronnald Ramp   Confirmed with patient if condition begins to worsen call PCP or go to the ER.  Patient was given the office number and encouraged to call back with question or concerns.  : YES

## 2018-04-09 DIAGNOSIS — E785 Hyperlipidemia, unspecified: Secondary | ICD-10-CM | POA: Diagnosis not present

## 2018-04-09 DIAGNOSIS — Z7901 Long term (current) use of anticoagulants: Secondary | ICD-10-CM | POA: Diagnosis not present

## 2018-04-09 DIAGNOSIS — G8929 Other chronic pain: Secondary | ICD-10-CM | POA: Diagnosis not present

## 2018-04-09 DIAGNOSIS — K219 Gastro-esophageal reflux disease without esophagitis: Secondary | ICD-10-CM | POA: Diagnosis not present

## 2018-04-09 DIAGNOSIS — J441 Chronic obstructive pulmonary disease with (acute) exacerbation: Secondary | ICD-10-CM | POA: Diagnosis not present

## 2018-04-09 DIAGNOSIS — Z87891 Personal history of nicotine dependence: Secondary | ICD-10-CM | POA: Diagnosis not present

## 2018-04-09 DIAGNOSIS — Z9181 History of falling: Secondary | ICD-10-CM | POA: Diagnosis not present

## 2018-04-09 DIAGNOSIS — Z86711 Personal history of pulmonary embolism: Secondary | ICD-10-CM | POA: Diagnosis not present

## 2018-04-09 DIAGNOSIS — M549 Dorsalgia, unspecified: Secondary | ICD-10-CM | POA: Diagnosis not present

## 2018-04-09 DIAGNOSIS — I5032 Chronic diastolic (congestive) heart failure: Secondary | ICD-10-CM | POA: Diagnosis not present

## 2018-04-09 DIAGNOSIS — Z86010 Personal history of colonic polyps: Secondary | ICD-10-CM | POA: Diagnosis not present

## 2018-04-09 DIAGNOSIS — I110 Hypertensive heart disease with heart failure: Secondary | ICD-10-CM | POA: Diagnosis not present

## 2018-04-09 DIAGNOSIS — G4733 Obstructive sleep apnea (adult) (pediatric): Secondary | ICD-10-CM | POA: Diagnosis not present

## 2018-04-09 DIAGNOSIS — Z9981 Dependence on supplemental oxygen: Secondary | ICD-10-CM | POA: Diagnosis not present

## 2018-04-09 DIAGNOSIS — M5000 Cervical disc disorder with myelopathy, unspecified cervical region: Secondary | ICD-10-CM | POA: Diagnosis not present

## 2018-04-09 DIAGNOSIS — J9611 Chronic respiratory failure with hypoxia: Secondary | ICD-10-CM | POA: Diagnosis not present

## 2018-04-09 DIAGNOSIS — G43001 Migraine without aura, not intractable, with status migrainosus: Secondary | ICD-10-CM | POA: Diagnosis not present

## 2018-04-09 DIAGNOSIS — M170 Bilateral primary osteoarthritis of knee: Secondary | ICD-10-CM | POA: Diagnosis not present

## 2018-04-09 DIAGNOSIS — Z79891 Long term (current) use of opiate analgesic: Secondary | ICD-10-CM | POA: Diagnosis not present

## 2018-04-09 DIAGNOSIS — E1151 Type 2 diabetes mellitus with diabetic peripheral angiopathy without gangrene: Secondary | ICD-10-CM | POA: Diagnosis not present

## 2018-04-11 ENCOUNTER — Telehealth: Payer: Self-pay | Admitting: Internal Medicine

## 2018-04-11 ENCOUNTER — Telehealth: Payer: Self-pay | Admitting: Adult Health

## 2018-04-11 DIAGNOSIS — I110 Hypertensive heart disease with heart failure: Secondary | ICD-10-CM | POA: Diagnosis not present

## 2018-04-11 DIAGNOSIS — J441 Chronic obstructive pulmonary disease with (acute) exacerbation: Secondary | ICD-10-CM | POA: Diagnosis not present

## 2018-04-11 DIAGNOSIS — J9611 Chronic respiratory failure with hypoxia: Secondary | ICD-10-CM | POA: Diagnosis not present

## 2018-04-11 DIAGNOSIS — I5032 Chronic diastolic (congestive) heart failure: Secondary | ICD-10-CM | POA: Diagnosis not present

## 2018-04-11 DIAGNOSIS — M5000 Cervical disc disorder with myelopathy, unspecified cervical region: Secondary | ICD-10-CM | POA: Diagnosis not present

## 2018-04-11 DIAGNOSIS — E1151 Type 2 diabetes mellitus with diabetic peripheral angiopathy without gangrene: Secondary | ICD-10-CM | POA: Diagnosis not present

## 2018-04-11 NOTE — Telephone Encounter (Signed)
Copied from [place] [employer] [place] [phone]. Topic: Quick Communication - See Telephone Encounter >> Apr 11, 2018  2:26 PM Percell Belt A wrote: CRM for notification. See Telephone encounter for: 04/11/18.  Tillie Rung with [facility] Need verbals for Social work eval, housing and transportation Pt is staying in a [employer]

## 2018-04-11 NOTE — Telephone Encounter (Signed)
Returned call. Lmtcb.  Left a voicemail for [facility] @ [facility]. Orders for an evaluation for social work for housing should probably come from the patient's pcp.  Tillie Rung can call back if any questions. Message fwd to [facility] and his nurse.

## 2018-04-11 NOTE — Telephone Encounter (Signed)
[facility]    Lynnell Chad Therapist is requesting orders for social work evaluation to help her get housing. Please call

## 2018-04-11 NOTE — Telephone Encounter (Signed)
Contacted Tillie Rung and gave verbal okay for Social Work as requested.

## 2018-04-14 ENCOUNTER — Telehealth: Payer: Self-pay | Admitting: Internal Medicine

## 2018-04-14 ENCOUNTER — Other Ambulatory Visit: Payer: Medicare Other

## 2018-04-14 ENCOUNTER — Other Ambulatory Visit (INDEPENDENT_AMBULATORY_CARE_PROVIDER_SITE_OTHER): Payer: Medicare Other

## 2018-04-14 ENCOUNTER — Ambulatory Visit (INDEPENDENT_AMBULATORY_CARE_PROVIDER_SITE_OTHER): Payer: Medicare Other | Admitting: Internal Medicine

## 2018-04-14 ENCOUNTER — Encounter: Payer: Self-pay | Admitting: Internal Medicine

## 2018-04-14 VITALS — BP 140/80 | HR 89 | Temp 98.1°F | Resp 20 | Ht 65.0 in | Wt 342.0 lb

## 2018-04-14 DIAGNOSIS — I519 Heart disease, unspecified: Secondary | ICD-10-CM

## 2018-04-14 DIAGNOSIS — E118 Type 2 diabetes mellitus with unspecified complications: Secondary | ICD-10-CM

## 2018-04-14 DIAGNOSIS — M5000 Cervical disc disorder with myelopathy, unspecified cervical region: Secondary | ICD-10-CM

## 2018-04-14 DIAGNOSIS — I10 Essential (primary) hypertension: Secondary | ICD-10-CM

## 2018-04-14 DIAGNOSIS — J418 Mixed simple and mucopurulent chronic bronchitis: Secondary | ICD-10-CM

## 2018-04-14 DIAGNOSIS — G4701 Insomnia due to medical condition: Secondary | ICD-10-CM | POA: Diagnosis not present

## 2018-04-14 DIAGNOSIS — G8929 Other chronic pain: Secondary | ICD-10-CM

## 2018-04-14 DIAGNOSIS — I5189 Other ill-defined heart diseases: Secondary | ICD-10-CM

## 2018-04-14 LAB — BASIC METABOLIC PANEL
BUN: 17 mg/dL (ref 6–23)
CALCIUM: 9.2 mg/dL (ref 8.4–10.5)
CO2: 29 mEq/L (ref 19–32)
Chloride: 104 mEq/L (ref 96–112)
Creatinine, Ser: 0.94 mg/dL (ref 0.40–1.20)
GFR: 75.75 mL/min (ref >60.00)
Glucose, Bld: 137 mg/dL — ABNORMAL HIGH (ref 70–99)
Potassium: 4.2 mEq/L (ref 3.5–5.1)
SODIUM: 139 meq/L (ref 135–145)

## 2018-04-14 MED ORDER — CARVEDILOL 3.125 MG PO TABS: ORAL_TABLET | ORAL | 1 refills | Status: DC

## 2018-04-14 MED ORDER — IPRATROPIUM-ALBUTEROL 0.5-2.5 (3) MG/3ML IN SOLN: 3.0000 mL | Freq: Three times a day (TID) | RESPIRATORY_TRACT | 3 refills | Status: DC

## 2018-04-14 MED ORDER — GABAPENTIN 400 MG PO CAPS: 400.0000 mg | ORAL_CAPSULE | Freq: Three times a day (TID) | ORAL | 1 refills | Status: DC

## 2018-04-14 NOTE — Patient Instructions (Signed)
Heart Failure Heart failure is a condition in which the heart has trouble pumping blood because it has become weak or stiff. This means that the heart does not pump blood efficiently for the body to work well. For some people with heart failure, fluid may back up into the lungs and there may be swelling (edema) in the lower legs. Heart failure is usually a long-term (chronic) condition. It is important for you to take good care of yourself and follow the treatment plan from your health care provider. What are the causes? This condition is caused by some health problems, including:  High blood pressure (hypertension). Hypertension causes the heart muscle to work harder than normal. High blood pressure eventually causes the heart to become stiff and weak.  Coronary artery disease (CAD). CAD is the buildup of cholesterol and fat (plaques) in the arteries of the heart.  Heart attack (myocardial infarction). Injured tissue, which is caused by the heart attack, does not contract as well and the heart's ability to pump blood is weakened.  Abnormal heart valves. When the heart valves do not open and close properly, the heart muscle must pump harder to keep the blood flowing.  Heart muscle disease (cardiomyopathy or myocarditis). Heart muscle disease is damage to the heart muscle from a variety of causes, such as drug or alcohol abuse, infections, or unknown causes. These can increase the risk of heart failure.  Lung disease. When the lungs do not work properly, the heart must work harder.  What increases the risk? Risk of heart failure increases as a person ages. This condition is also more likely to develop in people who:  Are overweight.  Are female.  Smoke or chew tobacco.  Abuse alcohol or illegal drugs.  Have taken medicines that can damage the heart, such as chemotherapy drugs.  Have diabetes. ? High blood sugar (glucose) is associated with high fat (lipid) levels in the blood. ? Diabetes  can also damage tiny blood vessels that carry nutrients to the heart muscle.  Have abnormal heart rhythms.  Have thyroid problems.  Have low blood counts (anemia).  What are the signs or symptoms? Symptoms of this condition include:  Shortness of breath with activity, such as when climbing stairs.  Persistent cough.  Swelling of the feet, ankles, legs, or abdomen.  Unexplained weight gain.  Difficulty breathing when lying flat (orthopnea).  Waking from sleep because of the need to sit up and get more air.  Rapid heartbeat.  Fatigue and loss of energy.  Feeling light-headed, dizzy, or close to fainting.  Loss of appetite.  Nausea.  Increased urination during the night (nocturia).  Confusion.  How is this diagnosed? This condition is diagnosed based on:  Medical history, symptoms, and a physical exam.  Diagnostic tests, which may include: ? Echocardiogram. ? Electrocardiogram (ECG). ? Chest X-ray. ? Blood tests. ? Exercise stress test. ? Radionuclide scans. ? Cardiac catheterization and angiogram.  How is this treated? Treatment for this condition is aimed at managing the symptoms of heart failure. Medicines, behavioral changes, or other treatments may be necessary to treat heart failure. Medicines These may include:  Angiotensin-converting enzyme (ACE) inhibitors. This type of medicine blocks the effects of a blood protein called angiotensin-converting enzyme. ACE inhibitors relax (dilate) the blood vessels and help to lower blood pressure.  Angiotensin receptor blockers (ARBs). This type of medicine blocks the actions of a blood protein called angiotensin. ARBs dilate the blood vessels and help to lower blood pressure.  Water   pills (diuretics). Diuretics cause the kidneys to remove salt and water from the blood. The extra fluid is removed through urination, leaving a lower volume of blood that the heart has to pump.  Beta blockers. These improve heart  muscle strength and they prevent the heart from beating too quickly.  Digoxin. This increases the force of the heartbeat.  Healthy behavior changes These may include:  Reaching and maintaining a healthy weight.  Stopping smoking or chewing tobacco.  Eating heart-healthy foods.  Limiting or avoiding alcohol.  Stopping use of street drugs (illegal drugs).  Physical activity.  Other treatments These may include:  Surgery to open blocked coronary arteries or repair damaged heart valves.  Placement of a biventricular pacemaker to improve heart muscle function (cardiac resynchronization therapy). This device paces both the right ventricle and left ventricle.  Placement of a device to treat serious abnormal heart rhythms (implantable cardioverter defibrillator, or ICD).  Placement of a device to improve the pumping ability of the heart (left ventricular assist device, or LVAD).  Heart transplant. This can cure heart failure, and it is considered for certain patients who do not improve with other therapies.  Follow these instructions at home: Medicines  Take over-the-counter and prescription medicines only as told by your health care provider. Medicines are important in reducing the workload of your heart, slowing the progression of heart failure, and improving your symptoms. ? Do not stop taking your medicine unless your health care provider told you to do that. ? Do not skip any dose of medicine. ? Refill your prescriptions before you run out of medicine. You need your medicines every day. Eating and drinking   Eat heart-healthy foods. Talk with a dietitian to make an eating plan that is right for you. ? Choose foods that contain no trans fat and are low in saturated fat and cholesterol. Healthy choices include fresh or frozen fruits and vegetables, fish, lean meats, legumes, fat-free or low-fat dairy products, and whole-grain or high-fiber foods. ? Limit salt (sodium) if  directed by your health care provider. Sodium restriction may reduce symptoms of heart failure. Ask a dietitian to recommend heart-healthy seasonings. ? Use healthy cooking methods instead of frying. Healthy methods include roasting, grilling, broiling, baking, poaching, steaming, and stir-frying.  Limit your fluid intake if directed by your health care provider. Fluid restriction may reduce symptoms of heart failure. Lifestyle  Stop smoking or using chewing tobacco. Nicotine and tobacco can damage your heart and your blood vessels. Do not use nicotine gum or patches before talking to your health care provider.  Limit alcohol intake to no more than 1 drink per day for non-pregnant women and 2 drinks per day for men. One drink equals 12 oz of beer, 5 oz of wine, or 1 oz of hard liquor. ? Drinking more than that is harmful to your heart. Tell your health care provider if you drink alcohol several times a week. ? Talk with your health care provider about whether any level of alcohol use is safe for you. ? If your heart has already been damaged by alcohol or you have severe heart failure, drinking alcohol should be stopped completely.  Stop use of illegal drugs.  Lose weight if directed by your health care provider. Weight loss may reduce symptoms of heart failure.  Do moderate physical activity if directed by your health care provider. People who are elderly and people with severe heart failure should consult with a health care provider for physical activity recommendations.   Monitor important information  Weigh yourself every day. Keeping track of your weight daily helps you to notice excess fluid sooner. ? Weigh yourself every morning after you urinate and before you eat breakfast. ? Wear the same amount of clothing each time you weigh yourself. ? Record your daily weight. Provide your health care provider with your weight record.  Monitor and record your blood pressure as told by your health  care provider.  Check your pulse as told by your health care provider. Dealing with extreme temperatures  If the weather is extremely hot: ? Avoid vigorous physical activity. ? Use air conditioning or fans or seek a cooler location. ? Avoid caffeine and alcohol. ? Wear loose-fitting, lightweight, and light-colored clothing.  If the weather is extremely cold: ? Avoid vigorous physical activity. ? Layer your clothes. ? Wear mittens or gloves, a hat, and a scarf when you go outside. ? Avoid alcohol. General instructions  Manage other health conditions such as hypertension, diabetes, thyroid disease, or abnormal heart rhythms as told by your health care provider.  Learn to manage stress. If you need help to do this, ask your health care provider.  Plan rest periods when fatigued.  Get ongoing education and support as needed.  Participate in or seek rehabilitation as needed to maintain or improve independence and quality of life.  Stay up to date with immunizations. Keeping current on pneumococcal and influenza immunizations is especially important to prevent respiratory infections.  Keep all follow-up visits as told by your health care provider. This is important. Contact a health care provider if:  You have a rapid weight gain.  You have increasing shortness of breath that is unusual for you.  You are unable to participate in your usual physical activities.  You tire easily.  You cough more than normal, especially with physical activity.  You have any swelling or more swelling in areas such as your hands, feet, ankles, or abdomen.  You are unable to sleep because it is hard to breathe.  You feel like your heart is beating quickly (palpitations).  You become dizzy or light-headed when you stand up. Get help right away if:  You have difficulty breathing.  You notice or your family notices a change in your awareness, such as having trouble staying awake or having  difficulty with concentration.  You have pain or discomfort in your chest.  You have an episode of fainting (syncope). This information is not intended to replace advice given to you by your health care provider. Make sure you discuss any questions you have with your health care provider. Document Released: 08/13/2005 Document Revised: 04/17/2016 Document Reviewed: 03/07/2016 Elsevier Interactive Patient Education  2018 [employer]  

## 2018-04-14 NOTE — Telephone Encounter (Signed)
Copied from [place] [phone]. Topic: Quick Communication - See Telephone Encounter >> Apr 14, 2018  5:08 PM Rutherford Nail, NT wrote: CRM for notification. See Telephone encounter for: 04/14/18. Patient calling and states that Dr Ronnald Ramp did not send in her hydrocodone to the pharmacy.  [id number] - [place], [place].

## 2018-04-14 NOTE — Progress Notes (Signed)
Subjective:  Patient ID: Janice Morrow, female    DOB: 1946-01-07  Age: 72 y.o. MRN: [id number]  CC: COPD and Congestive Heart Failure   HPI Janice Morrow presents for f/up - She was recently admitted for acute respiratory failure that was multifactorial.  She returns today for follow-up and tells me she is feeling somewhat better.  She continues to have mild nonproductive cough but says she is getting some symptom relief from her inhalers.   Recommendations for Outpatient Follow-up:  1. Follow up with PCP in 1-2 weeks 2. Please obtain BMP/CBC in one week 3.   Home Health: yes.   Discharge Condition: Stable.  CODE STATUS: Full code.  Diet recommendation: Heart Healthy   Brief/Interim Summary:  Brief Narrative:71 y.o.femalewitha hxoftrigeminal neuralgia; seizures; PVD; OSA on CPAP; HLD; HTN; COPD; PE on Eliquis,and diastolic CHF who presentedwith SOB and chest pain in the leftshoulderand down the arm.   Assessment & Plan:  Principal Problem: Acute on chronic respiratory failure with hypoxia (HCC) Active Problems: Hyperlipidemia with target LDL less than 130 Severe obesity (BMI >= 40) (HCC) COPD (chronic obstructive pulmonary disease) (HCC) Essential hypertension Type 2 diabetes mellitus with complication, without long-term current use of insulin [facility]  Outpatient Medications Prior to Visit  Medication Sig Dispense Refill  . docusate sodium (COLACE) 100 MG capsule Take 1 capsule (100 mg total) by mouth 2 (two) times daily. 10 capsule 0  . ELIQUIS 5 MG TABS tablet TAKE 1 TABLET BY MOUTH TWICE A DAY 60 tablet 3  . famotidine (PEPCID) 20 MG tablet Take 1 tablet (20 mg total) by mouth daily. 10 tablet 0  . furosemide (LASIX) 40 MG tablet Take 1 tablet (40 mg total) by mouth daily. 90 tablet 1  . OXYGEN Inhale 4 L into the lungs.    [name] tiZANidine (ZANAFLEX) 2 MG tablet TAKE 1 TABLET (2 MG TOTAL) BY MOUTH EVERY 8 (EIGHT) HOURS AS NEEDED. FOR  MIGRAINE 90 tablet 3  . umeclidinium-vilanterol (ANORO ELLIPTA) 62.5-25 MCG/INH AEPB Inhale 1 puff into the lungs daily. 90 each 1  . zolpidem (AMBIEN) 5 MG tablet Take 1 tablet (5 mg total) by mouth at bedtime as needed for sleep. 90 tablet 1  . carvedilol (COREG) 3.125 MG tablet TAKE 1 TABLET BY MOUTH 2 TIMES DAILY WITH A MEAL 180 tablet 0  . gabapentin (NEURONTIN) 400 MG capsule Take 400-1,200 mg by mouth See admin instructions. TAKE 1 CAPSULE AT LUNCH, TAKE 2 CAPSULES AT SUPPER AND 3 CAPSULES AT BEDTIME     . guaiFENesin-codeine 100-10 MG/5ML syrup Take 5 mLs by mouth every 6 (six) hours as needed for cough. 120 mL 0  . HYDROcodone-acetaminophen (NORCO) 10-325 MG tablet Take 1 tablet by mouth every 6 (six) hours as needed. (Patient not taking: Reported on 03/27/2018) 90 tablet 0  . ipratropium-albuterol (DUONEB) 0.5-2.5 (3) MG/3ML SOLN Take 3 mLs by nebulization 3 (three) times daily. 360 mL 0  . predniSONE (DELTASONE) 20 MG tablet Take 2 tablets (40 mg total) by mouth daily with breakfast. 8 tablet 0   No facility-administered medications prior to visit.     ROS Review of Systems  Constitutional: Positive for fatigue. Negative for chills, diaphoresis, fever and unexpected weight change.  HENT: Negative.   Eyes: Negative for visual disturbance.  Respiratory: Positive for cough and shortness of breath. Negative for chest tightness and wheezing.   Cardiovascular: Negative for chest pain, palpitations and leg swelling.  Gastrointestinal: Negative for abdominal pain, constipation, diarrhea, nausea  and vomiting.  Endocrine: Negative.   Genitourinary: Negative.  Negative for difficulty urinating.  Musculoskeletal: Positive for arthralgias and neck pain. Negative for myalgias and neck stiffness.  Skin: Negative.  Negative for color change.  Neurological: Negative.  Negative for dizziness, weakness and light-headedness.  Hematological: Negative for adenopathy. Does not bruise/bleed easily.    Psychiatric/Behavioral: Negative.     Objective:  BP 140/80   Pulse 89   Temp 98.1 F (36.7 C) (Oral)   Resp 20   Ht 5\' 5"  (1.651 m)   Wt (!) 342 lb (155.1 kg)   LMP 08/27/1993 (Approximate)   SpO2 93%   BMI 56.91 kg/m   BP Readings from Last 3 Encounters:  04/14/18 140/80  04/01/18 (!) 145/107  03/06/18 (!) 164/90    Wt Readings from Last 3 Encounters:  04/14/18 (!) 342 lb (155.1 kg)  04/01/18 (!) 337 lb 4.9 oz (153 kg)  03/06/18 (!) 337 lb (152.9 kg)    Physical Exam  Constitutional: She is oriented to person, place, and time. No distress.  HENT:  Mouth/Throat: Oropharynx is clear and moist. No oropharyngeal exudate.  Eyes: Conjunctivae are normal. No scleral icterus.  Neck: Normal range of motion. Neck supple. No thyromegaly present.  Cardiovascular: Normal rate, regular rhythm and normal heart sounds. Exam reveals no gallop.  No murmur heard. Pulmonary/Chest: Effort normal and breath sounds normal. No respiratory distress. She has no wheezes. She has no rales.  Abdominal: Soft. Normal appearance and bowel sounds are normal. She exhibits no mass. There is no hepatosplenomegaly. There is no tenderness.  Musculoskeletal: Normal range of motion. She exhibits no edema, tenderness or deformity.  Lymphadenopathy:    She has no cervical adenopathy.  Neurological: She is alert and oriented to person, place, and time.  Skin: Skin is warm and dry. She is not diaphoretic. No pallor.  Vitals reviewed.   Lab Results  Component Value Date   WBC 11.5 (H) 03/29/2018   HGB 14.1 03/29/2018   HCT 48.0 (H) 03/29/2018   PLT 184 03/29/2018   GLUCOSE 137 (H) 04/14/2018   CHOL 179 01/30/2017   TRIG 128.0 01/30/2017   HDL 59.90 01/30/2017   LDLCALC 93 01/30/2017   ALT 13 03/29/2018   AST 17 03/29/2018   NA 139 04/14/2018   K 4.2 04/14/2018   CL 104 04/14/2018   CREATININE 0.94 04/14/2018   BUN 17 04/14/2018   CO2 29 04/14/2018   TSH 2.65 01/30/2017   INR 1.18 09/30/2017    HGBA1C 5.8 (A) 01/28/2018   MICROALBUR 9.8 (H) 05/20/2017    Dg Chest 2 View  Result Date: 03/27/2018 CLINICAL DATA:  CP x 2 days; pt states that its ever changing, coming and going; central chest, states it radiates to left arm; movement and coughing makes it worse; cough for 1 month EXAM: CHEST - 2 VIEW COMPARISON:  10/24/2017, chest CT 10/24/2017 FINDINGS: Heart size is accentuated by the AP technique but is mildly enlarged. There is prominence of the pulmonary arteries, stable in appearance. There are no focal consolidations or pleural effusions. No evidence for pulmonary edema. Persistent atelectasis or scarring in the lingula and RIGHT middle lobe. IMPRESSION: 1. Mildly enlarged heart.  No evidence for edema. 2. Prominent pulmonary arteries, consistent with pulmonary arterial hypertension. 3. Stable scarring or atelectasis in the RIGHT middle lobe and lingula. Electronically Signed   By: Nolon Nations M.D.   On: 03/27/2018 11:10   Ct Angio Chest Pe W/cm &/or Wo Cm  Result  Date: 03/27/2018 CLINICAL DATA:  Chest pain, 2 days duration. Symptoms are intermittent. Radiates to the left arm. EXAM: CT ANGIOGRAPHY CHEST WITH CONTRAST TECHNIQUE: Multidetector CT imaging of the chest was performed using the standard protocol during bolus administration of intravenous contrast. Multiplanar CT image reconstructions and MIPs were obtained to evaluate the vascular anatomy. CONTRAST:  76mL ISOVUE-370 IOPAMIDOL (ISOVUE-370) INJECTION 76% COMPARISON:  Radiography same day.  CT 10/24/2017. FINDINGS: Cardiovascular: Pulmonary arterial opacification is good. There are no pulmonary emboli. Aortic atherosclerotic calcification is noted. Heart is mildly enlarged. No coronary artery calcification is seen. Mediastinum/Nodes: Normal Lungs/Pleura: The lungs are clear except for mild patchy atelectasis at the lung bases right more than left. No pleural fluid. Upper Abdomen: Renal calculi on the left. Musculoskeletal:  Ordinary degenerative changes affect the spine. Review of the MIP images confirms the above findings. IMPRESSION: No pulmonary emboli. Mild cardiomegaly and aortic atherosclerosis. Mild atelectasis at the lung bases.  No other active finding. Electronically Signed   By: Nelson Chimes M.D.   On: 03/27/2018 15:35    Assessment & Plan:   Vona was seen today for copd and congestive heart failure.  Diagnoses and all orders for this visit:  Essential hypertension- Her blood pressure is well controlled.  Electrolytes and renal function are normal. -     Basic metabolic panel; Future -     carvedilol (COREG) 3.125 MG tablet; TAKE 1 TABLET BY MOUTH 2 TIMES DAILY WITH A MEAL  Type 2 diabetes mellitus with complication, without long-term current use of insulin ([place])- Her recent A1c was 5.8%.  Her blood sugars are adequately well controlled. -     Basic metabolic panel; Future  Mixed simple and mucopurulent chronic bronchitis ([place])- Will continue the LABA/LAMA inhaler as well as nebulized use of DuoNeb. -     ipratropium-albuterol (DUONEB) 0.5-2.5 (3) MG/3ML SOLN; Take 3 mLs by nebulization 3 (three) times daily.  Insomnia secondary to chronic pain  Cervical disc disease with myelopathy -     gabapentin (NEURONTIN) 400 MG capsule; Take 1 capsule (400 mg total) by mouth 3 (three) times daily.  Left ventricular diastolic dysfunction, NYHA class 1- She has a normal volume status today.  Her BP is well controlled.  She will avoid sodium and continue taking carvedilol at the current dose. -     carvedilol (COREG) 3.125 MG tablet; TAKE 1 TABLET BY MOUTH 2 TIMES DAILY WITH A MEAL   I have discontinued Hoyle Sauer V. Riecke's HYDROcodone-acetaminophen, guaiFENesin-codeine, and predniSONE. I have also changed her gabapentin. Additionally, I am having her maintain her furosemide, OXYGEN, ELIQUIS, umeclidinium-vilanterol, zolpidem, tiZANidine, docusate sodium, famotidine, ipratropium-albuterol, and  carvedilol.  Meds ordered this encounter  Medications  . ipratropium-albuterol (DUONEB) 0.5-2.5 (3) MG/3ML SOLN    Sig: Take 3 mLs by nebulization 3 (three) times daily.    Dispense:  360 mL    Refill:  3  . gabapentin (NEURONTIN) 400 MG capsule    Sig: Take 1 capsule (400 mg total) by mouth 3 (three) times daily.    Dispense:  270 capsule    Refill:  1  . carvedilol (COREG) 3.125 MG tablet    Sig: TAKE 1 TABLET BY MOUTH 2 TIMES DAILY WITH A MEAL    Dispense:  180 tablet    Refill:  1     Follow-up: Return in about 4 months (around 08/14/2018).  [name], MD

## 2018-04-15 ENCOUNTER — Telehealth: Payer: Self-pay | Admitting: Internal Medicine

## 2018-04-15 DIAGNOSIS — J9611 Chronic respiratory failure with hypoxia: Secondary | ICD-10-CM | POA: Diagnosis not present

## 2018-04-15 DIAGNOSIS — E1151 Type 2 diabetes mellitus with diabetic peripheral angiopathy without gangrene: Secondary | ICD-10-CM | POA: Diagnosis not present

## 2018-04-15 DIAGNOSIS — I110 Hypertensive heart disease with heart failure: Secondary | ICD-10-CM | POA: Diagnosis not present

## 2018-04-15 DIAGNOSIS — J441 Chronic obstructive pulmonary disease with (acute) exacerbation: Secondary | ICD-10-CM | POA: Diagnosis not present

## 2018-04-15 DIAGNOSIS — I5032 Chronic diastolic (congestive) heart failure: Secondary | ICD-10-CM | POA: Diagnosis not present

## 2018-04-15 DIAGNOSIS — M5000 Cervical disc disorder with myelopathy, unspecified cervical region: Secondary | ICD-10-CM | POA: Diagnosis not present

## 2018-04-15 NOTE — Telephone Encounter (Signed)
See telephone encounter from 04/14/18

## 2018-04-15 NOTE — Telephone Encounter (Signed)
Patient calling again and states that Dr Ronnald Ramp did not send in her hydrocodone to the pharmacy.  [id number] - [place], [place].

## 2018-04-15 NOTE — Telephone Encounter (Signed)
Copied from [place] [phone]. Topic: Quick Communication - See Telephone Encounter >> Apr 14, 2018  5:08 PM Rutherford Nail, NT wrote: CRM for notification. See Telephone encounter for: 04/14/18. Patient calling and states that Dr Ronnald Ramp did not send in her hydrocodone to the pharmacy.  [id number] - [place], [place].

## 2018-04-15 NOTE — Telephone Encounter (Signed)
Pt states that she is not able to see the pain specialist anymore and is requesting for PCP to rx her hyrdrocodone. Please advise if this is approved.  Pt was previously rx'ed the 10-325 mg.

## 2018-04-16 NOTE — Telephone Encounter (Signed)
Duplicate message. Closing note 

## 2018-04-16 NOTE — Telephone Encounter (Signed)
Please advise I believe Hydrocodone was doscontinued?

## 2018-04-20 ENCOUNTER — Other Ambulatory Visit: Payer: Self-pay | Admitting: Internal Medicine

## 2018-04-20 DIAGNOSIS — I5189 Other ill-defined heart diseases: Secondary | ICD-10-CM

## 2018-04-20 DIAGNOSIS — I519 Heart disease, unspecified: Secondary | ICD-10-CM

## 2018-04-20 DIAGNOSIS — I10 Essential (primary) hypertension: Secondary | ICD-10-CM

## 2018-04-22 DIAGNOSIS — J9611 Chronic respiratory failure with hypoxia: Secondary | ICD-10-CM | POA: Diagnosis not present

## 2018-04-22 DIAGNOSIS — I110 Hypertensive heart disease with heart failure: Secondary | ICD-10-CM | POA: Diagnosis not present

## 2018-04-22 DIAGNOSIS — J441 Chronic obstructive pulmonary disease with (acute) exacerbation: Secondary | ICD-10-CM | POA: Diagnosis not present

## 2018-04-22 DIAGNOSIS — I5032 Chronic diastolic (congestive) heart failure: Secondary | ICD-10-CM | POA: Diagnosis not present

## 2018-04-22 DIAGNOSIS — E1151 Type 2 diabetes mellitus with diabetic peripheral angiopathy without gangrene: Secondary | ICD-10-CM | POA: Diagnosis not present

## 2018-04-22 DIAGNOSIS — M5000 Cervical disc disorder with myelopathy, unspecified cervical region: Secondary | ICD-10-CM | POA: Diagnosis not present

## 2018-04-22 NOTE — Telephone Encounter (Signed)
Pt has not received this yet.  Please advise

## 2018-04-28 ENCOUNTER — Other Ambulatory Visit: Payer: Self-pay | Admitting: Internal Medicine

## 2018-04-28 DIAGNOSIS — I2699 Other pulmonary embolism without acute cor pulmonale: Secondary | ICD-10-CM

## 2018-04-30 DIAGNOSIS — J9611 Chronic respiratory failure with hypoxia: Secondary | ICD-10-CM | POA: Diagnosis not present

## 2018-04-30 DIAGNOSIS — I110 Hypertensive heart disease with heart failure: Secondary | ICD-10-CM | POA: Diagnosis not present

## 2018-04-30 DIAGNOSIS — M5000 Cervical disc disorder with myelopathy, unspecified cervical region: Secondary | ICD-10-CM | POA: Diagnosis not present

## 2018-04-30 DIAGNOSIS — J441 Chronic obstructive pulmonary disease with (acute) exacerbation: Secondary | ICD-10-CM | POA: Diagnosis not present

## 2018-04-30 DIAGNOSIS — I5032 Chronic diastolic (congestive) heart failure: Secondary | ICD-10-CM | POA: Diagnosis not present

## 2018-04-30 DIAGNOSIS — E1151 Type 2 diabetes mellitus with diabetic peripheral angiopathy without gangrene: Secondary | ICD-10-CM | POA: Diagnosis not present

## 2018-05-06 DIAGNOSIS — I110 Hypertensive heart disease with heart failure: Secondary | ICD-10-CM | POA: Diagnosis not present

## 2018-05-06 DIAGNOSIS — I5032 Chronic diastolic (congestive) heart failure: Secondary | ICD-10-CM | POA: Diagnosis not present

## 2018-05-06 DIAGNOSIS — M5000 Cervical disc disorder with myelopathy, unspecified cervical region: Secondary | ICD-10-CM | POA: Diagnosis not present

## 2018-05-06 DIAGNOSIS — E1151 Type 2 diabetes mellitus with diabetic peripheral angiopathy without gangrene: Secondary | ICD-10-CM | POA: Diagnosis not present

## 2018-05-06 DIAGNOSIS — J9611 Chronic respiratory failure with hypoxia: Secondary | ICD-10-CM | POA: Diagnosis not present

## 2018-05-06 DIAGNOSIS — J441 Chronic obstructive pulmonary disease with (acute) exacerbation: Secondary | ICD-10-CM | POA: Diagnosis not present

## 2018-05-23 DIAGNOSIS — E785 Hyperlipidemia, unspecified: Secondary | ICD-10-CM

## 2018-05-23 DIAGNOSIS — Z86010 Personal history of colonic polyps: Secondary | ICD-10-CM

## 2018-05-23 DIAGNOSIS — K219 Gastro-esophageal reflux disease without esophagitis: Secondary | ICD-10-CM

## 2018-05-23 DIAGNOSIS — M170 Bilateral primary osteoarthritis of knee: Secondary | ICD-10-CM

## 2018-05-23 DIAGNOSIS — Z87891 Personal history of nicotine dependence: Secondary | ICD-10-CM

## 2018-05-23 DIAGNOSIS — I110 Hypertensive heart disease with heart failure: Secondary | ICD-10-CM | POA: Diagnosis not present

## 2018-05-23 DIAGNOSIS — E1151 Type 2 diabetes mellitus with diabetic peripheral angiopathy without gangrene: Secondary | ICD-10-CM | POA: Diagnosis not present

## 2018-05-23 DIAGNOSIS — Z79891 Long term (current) use of opiate analgesic: Secondary | ICD-10-CM

## 2018-05-23 DIAGNOSIS — Z7901 Long term (current) use of anticoagulants: Secondary | ICD-10-CM

## 2018-05-23 DIAGNOSIS — G4733 Obstructive sleep apnea (adult) (pediatric): Secondary | ICD-10-CM

## 2018-05-23 DIAGNOSIS — M5000 Cervical disc disorder with myelopathy, unspecified cervical region: Secondary | ICD-10-CM

## 2018-05-23 DIAGNOSIS — M549 Dorsalgia, unspecified: Secondary | ICD-10-CM

## 2018-05-23 DIAGNOSIS — G43001 Migraine without aura, not intractable, with status migrainosus: Secondary | ICD-10-CM

## 2018-05-23 DIAGNOSIS — Z86711 Personal history of pulmonary embolism: Secondary | ICD-10-CM

## 2018-05-23 DIAGNOSIS — J441 Chronic obstructive pulmonary disease with (acute) exacerbation: Secondary | ICD-10-CM | POA: Diagnosis not present

## 2018-05-23 DIAGNOSIS — I5032 Chronic diastolic (congestive) heart failure: Secondary | ICD-10-CM

## 2018-05-23 DIAGNOSIS — Z9181 History of falling: Secondary | ICD-10-CM

## 2018-05-23 DIAGNOSIS — J9611 Chronic respiratory failure with hypoxia: Secondary | ICD-10-CM | POA: Diagnosis not present

## 2018-05-23 DIAGNOSIS — Z9981 Dependence on supplemental oxygen: Secondary | ICD-10-CM

## 2018-05-23 DIAGNOSIS — G8929 Other chronic pain: Secondary | ICD-10-CM

## 2018-06-18 ENCOUNTER — Other Ambulatory Visit: Payer: Self-pay | Admitting: Internal Medicine

## 2018-06-18 DIAGNOSIS — M170 Bilateral primary osteoarthritis of knee: Secondary | ICD-10-CM

## 2018-06-18 DIAGNOSIS — G8929 Other chronic pain: Secondary | ICD-10-CM

## 2018-06-18 DIAGNOSIS — M5440 Lumbago with sciatica, unspecified side: Secondary | ICD-10-CM

## 2018-06-30 ENCOUNTER — Ambulatory Visit: Payer: Medicare Other

## 2018-07-20 ENCOUNTER — Other Ambulatory Visit: Payer: Self-pay | Admitting: Adult Health

## 2018-07-28 ENCOUNTER — Emergency Department (HOSPITAL_COMMUNITY): Payer: Medicare Other

## 2018-07-28 ENCOUNTER — Encounter (HOSPITAL_COMMUNITY): Payer: Self-pay

## 2018-07-28 ENCOUNTER — Other Ambulatory Visit: Payer: Self-pay

## 2018-07-28 ENCOUNTER — Observation Stay (HOSPITAL_COMMUNITY)
Admission: EM | Admit: 2018-07-28 | Discharge: 2018-07-30 | Disposition: A | Discharge status: Home / Self Care | Payer: Medicare Other | Attending: Internal Medicine | Admitting: Internal Medicine

## 2018-07-28 DIAGNOSIS — N200 Calculus of kidney: Secondary | ICD-10-CM | POA: Insufficient documentation

## 2018-07-28 DIAGNOSIS — Z886 Allergy status to analgesic agent status: Secondary | ICD-10-CM | POA: Diagnosis not present

## 2018-07-28 DIAGNOSIS — N289 Disorder of kidney and ureter, unspecified: Secondary | ICD-10-CM | POA: Diagnosis not present

## 2018-07-28 DIAGNOSIS — J418 Mixed simple and mucopurulent chronic bronchitis: Secondary | ICD-10-CM

## 2018-07-28 DIAGNOSIS — I420 Dilated cardiomyopathy: Secondary | ICD-10-CM | POA: Insufficient documentation

## 2018-07-28 DIAGNOSIS — I10 Essential (primary) hypertension: Secondary | ICD-10-CM | POA: Diagnosis present

## 2018-07-28 DIAGNOSIS — R42 Dizziness and giddiness: Secondary | ICD-10-CM

## 2018-07-28 DIAGNOSIS — I5042 Chronic combined systolic (congestive) and diastolic (congestive) heart failure: Secondary | ICD-10-CM | POA: Insufficient documentation

## 2018-07-28 DIAGNOSIS — Z79899 Other long term (current) drug therapy: Secondary | ICD-10-CM | POA: Insufficient documentation

## 2018-07-28 DIAGNOSIS — J449 Chronic obstructive pulmonary disease, unspecified: Secondary | ICD-10-CM | POA: Diagnosis not present

## 2018-07-28 DIAGNOSIS — R52 Pain, unspecified: Secondary | ICD-10-CM | POA: Diagnosis not present

## 2018-07-28 DIAGNOSIS — W19XXXA Unspecified fall, initial encounter: Secondary | ICD-10-CM | POA: Diagnosis not present

## 2018-07-28 DIAGNOSIS — I110 Hypertensive heart disease with heart failure: Secondary | ICD-10-CM | POA: Diagnosis not present

## 2018-07-28 DIAGNOSIS — Z6841 Body Mass Index (BMI) 40.0 and over, adult: Secondary | ICD-10-CM | POA: Diagnosis not present

## 2018-07-28 DIAGNOSIS — I252 Old myocardial infarction: Secondary | ICD-10-CM | POA: Diagnosis not present

## 2018-07-28 DIAGNOSIS — M542 Cervicalgia: Secondary | ICD-10-CM | POA: Diagnosis not present

## 2018-07-28 DIAGNOSIS — E785 Hyperlipidemia, unspecified: Secondary | ICD-10-CM | POA: Insufficient documentation

## 2018-07-28 DIAGNOSIS — I491 Atrial premature depolarization: Secondary | ICD-10-CM | POA: Diagnosis not present

## 2018-07-28 DIAGNOSIS — M47812 Spondylosis without myelopathy or radiculopathy, cervical region: Secondary | ICD-10-CM | POA: Insufficient documentation

## 2018-07-28 DIAGNOSIS — M6281 Muscle weakness (generalized): Secondary | ICD-10-CM | POA: Diagnosis not present

## 2018-07-28 DIAGNOSIS — R55 Syncope and collapse: Secondary | ICD-10-CM | POA: Diagnosis not present

## 2018-07-28 DIAGNOSIS — J9611 Chronic respiratory failure with hypoxia: Secondary | ICD-10-CM | POA: Insufficient documentation

## 2018-07-28 DIAGNOSIS — R0789 Other chest pain: Secondary | ICD-10-CM | POA: Diagnosis not present

## 2018-07-28 DIAGNOSIS — I6782 Cerebral ischemia: Secondary | ICD-10-CM | POA: Diagnosis not present

## 2018-07-28 DIAGNOSIS — G319 Degenerative disease of nervous system, unspecified: Secondary | ICD-10-CM | POA: Insufficient documentation

## 2018-07-28 DIAGNOSIS — R2681 Unsteadiness on feet: Secondary | ICD-10-CM | POA: Diagnosis not present

## 2018-07-28 DIAGNOSIS — E1151 Type 2 diabetes mellitus with diabetic peripheral angiopathy without gangrene: Secondary | ICD-10-CM | POA: Insufficient documentation

## 2018-07-28 DIAGNOSIS — I2510 Atherosclerotic heart disease of native coronary artery without angina pectoris: Secondary | ICD-10-CM | POA: Insufficient documentation

## 2018-07-28 DIAGNOSIS — G4733 Obstructive sleep apnea (adult) (pediatric): Secondary | ICD-10-CM | POA: Insufficient documentation

## 2018-07-28 DIAGNOSIS — S0990XA Unspecified injury of head, initial encounter: Secondary | ICD-10-CM | POA: Diagnosis not present

## 2018-07-28 DIAGNOSIS — R079 Chest pain, unspecified: Secondary | ICD-10-CM | POA: Diagnosis present

## 2018-07-28 DIAGNOSIS — S199XXA Unspecified injury of neck, initial encounter: Secondary | ICD-10-CM | POA: Diagnosis not present

## 2018-07-28 DIAGNOSIS — M79602 Pain in left arm: Secondary | ICD-10-CM | POA: Diagnosis not present

## 2018-07-28 DIAGNOSIS — Z86711 Personal history of pulmonary embolism: Secondary | ICD-10-CM | POA: Insufficient documentation

## 2018-07-28 DIAGNOSIS — K219 Gastro-esophageal reflux disease without esophagitis: Secondary | ICD-10-CM | POA: Diagnosis not present

## 2018-07-28 DIAGNOSIS — I700 Atherosclerosis of aorta: Secondary | ICD-10-CM | POA: Insufficient documentation

## 2018-07-28 DIAGNOSIS — R0902 Hypoxemia: Secondary | ICD-10-CM | POA: Diagnosis not present

## 2018-07-28 DIAGNOSIS — R0600 Dyspnea, unspecified: Secondary | ICD-10-CM | POA: Diagnosis not present

## 2018-07-28 LAB — CBC
HEMATOCRIT: 46.8 % — AB (ref 36.0–46.0)
HEMOGLOBIN: 13.6 g/dL (ref 12.0–15.0)
MCH: 26.5 pg (ref 26.0–34.0)
MCHC: 29.1 g/dL — AB (ref 30.0–36.0)
MCV: 91.2 fL (ref 80.0–100.0)
NRBC: 0 % (ref 0.0–0.2)
Platelets: 181 10*3/uL (ref 150–400)
RBC: 5.13 MIL/uL — ABNORMAL HIGH (ref 3.87–5.11)
RDW: 14.6 % (ref 11.5–15.5)
WBC: 8.5 10*3/uL (ref 4.0–10.5)

## 2018-07-28 LAB — BASIC METABOLIC PANEL
Anion gap: 10 (ref 5–15)
BUN: 19 mg/dL (ref 8–23)
CHLORIDE: 102 mmol/L (ref 98–111)
CO2: 27 mmol/L (ref 22–32)
Calcium: 8.9 mg/dL (ref 8.9–10.3)
Creatinine, Ser: 1.02 mg/dL — ABNORMAL HIGH (ref 0.44–1.00)
GFR calc Af Amer: >60 mL/min (ref >60)
GFR calc non Af Amer: 56 mL/min — ABNORMAL LOW (ref >60)
GLUCOSE: 110 mg/dL — AB (ref 70–99)
Potassium: 4 mmol/L (ref 3.5–5.1)
Sodium: 139 mmol/L (ref 135–145)

## 2018-07-28 LAB — BRAIN NATRIURETIC PEPTIDE: B NATRIURETIC PEPTIDE 5: 43.5 pg/mL (ref 0.0–100.0)

## 2018-07-28 LAB — I-STAT TROPONIN, ED: Troponin i, poc: 0.01 ng/mL (ref 0.00–0.08)

## 2018-07-28 MED ORDER — ACETAMINOPHEN 325 MG PO TABS
650.0000 mg | ORAL_TABLET | ORAL | Status: DC | PRN
  Administered 2018-07-29: 650 mg via ORAL
  Filled 2018-07-28: qty 2

## 2018-07-28 MED ORDER — PANTOPRAZOLE SODIUM 20 MG PO TBEC
20.0000 mg | DELAYED_RELEASE_TABLET | Freq: Every day | ORAL | Status: DC
  Administered 2018-07-29 – 2018-07-30 (×2): 20 mg via ORAL
  Filled 2018-07-28 (×2): qty 1

## 2018-07-28 MED ORDER — IOPAMIDOL (ISOVUE-370) INJECTION 76%
INTRAVENOUS | Status: AC
  Filled 2018-07-28: qty 100

## 2018-07-28 MED ORDER — APIXABAN 5 MG PO TABS
5.0000 mg | ORAL_TABLET | Freq: Two times a day (BID) | ORAL | Status: DC
  Administered 2018-07-28 – 2018-07-30 (×4): 5 mg via ORAL
  Filled 2018-07-28 (×4): qty 1

## 2018-07-28 MED ORDER — TIZANIDINE HCL 4 MG PO TABS
2.0000 mg | ORAL_TABLET | Freq: Three times a day (TID) | ORAL | Status: DC | PRN
  Administered 2018-07-29: 2 mg via ORAL
  Filled 2018-07-28: qty 1

## 2018-07-28 MED ORDER — ENOXAPARIN SODIUM 40 MG/0.4ML ~~LOC~~ SOLN: 40.0000 mg | SUBCUTANEOUS | Status: DC

## 2018-07-28 MED ORDER — FAMOTIDINE 20 MG PO TABS
20.0000 mg | ORAL_TABLET | Freq: Every day | ORAL | Status: DC
  Administered 2018-07-29 – 2018-07-30 (×2): 20 mg via ORAL
  Filled 2018-07-28 (×2): qty 1

## 2018-07-28 MED ORDER — DOCUSATE SODIUM 100 MG PO CAPS
100.0000 mg | ORAL_CAPSULE | Freq: Two times a day (BID) | ORAL | Status: DC
  Administered 2018-07-28 – 2018-07-29 (×3): 100 mg via ORAL
  Filled 2018-07-28 (×4): qty 1

## 2018-07-28 MED ORDER — GABAPENTIN 400 MG PO CAPS
400.0000 mg | ORAL_CAPSULE | Freq: Three times a day (TID) | ORAL | Status: DC
  Administered 2018-07-28 – 2018-07-30 (×5): 400 mg via ORAL
  Filled 2018-07-28 (×5): qty 1

## 2018-07-28 MED ORDER — IOPAMIDOL (ISOVUE-370) INJECTION 76%
100.0000 mL | Freq: Once | INTRAVENOUS | Status: AC | PRN
  Administered 2018-07-28: 100 mL via INTRAVENOUS

## 2018-07-28 MED ORDER — CARVEDILOL 3.125 MG PO TABS
3.1250 mg | ORAL_TABLET | Freq: Two times a day (BID) | ORAL | Status: DC
  Administered 2018-07-29 – 2018-07-30 (×3): 3.125 mg via ORAL
  Filled 2018-07-28 (×3): qty 1

## 2018-07-28 MED ORDER — ONDANSETRON HCL 4 MG/2ML IJ SOLN: 4.0000 mg | Freq: Four times a day (QID) | INTRAMUSCULAR | Status: DC | PRN

## 2018-07-28 MED ORDER — FUROSEMIDE 20 MG PO TABS
40.0000 mg | ORAL_TABLET | Freq: Every day | ORAL | Status: DC
  Administered 2018-07-29 – 2018-07-30 (×2): 40 mg via ORAL
  Filled 2018-07-28 (×3): qty 2

## 2018-07-28 MED ORDER — ZOLPIDEM TARTRATE 5 MG PO TABS
5.0000 mg | ORAL_TABLET | Freq: Every evening | ORAL | Status: DC | PRN
  Administered 2018-07-29: 5 mg via ORAL
  Filled 2018-07-28: qty 1

## 2018-07-28 MED ORDER — ALBUTEROL SULFATE (2.5 MG/3ML) 0.083% IN NEBU: 2.5000 mg | INHALATION_SOLUTION | RESPIRATORY_TRACT | Status: DC | PRN

## 2018-07-28 MED ORDER — IPRATROPIUM-ALBUTEROL 0.5-2.5 (3) MG/3ML IN SOLN
3.0000 mL | Freq: Three times a day (TID) | RESPIRATORY_TRACT | Status: DC
  Administered 2018-07-28 – 2018-07-29 (×2): 3 mL via RESPIRATORY_TRACT
  Filled 2018-07-28 (×2): qty 3

## 2018-07-28 NOTE — H&P (Signed)
History and Physical    Janice Morrow:[id number] DOB: 03/29/1946 DOA: 07/28/2018  PCP: Janith Lima, MD   Patient coming from: Home   Chief Complaint: Syncope, chest pain, SOB   HPI: Janice Morrow is a 72 y.o. female with medical history significant for obesity (BMI 60), chronic diastolic CHF, history of PE on Eliquis, COPD with chronic hypoxic respiratory failure, and chronic back pain, now presenting to the emergency department for evaluation of chest pain and syncope.  Patient reports several days of lightheadedness, particularly upon standing, as well as intermittent chest pain.  Chest pain has occurred with both rest and activity, last approximately 30 minutes, radiates towards the left arm, and associated with shortness of breath, but no nausea, vomiting, or diaphoresis.  She denies any recent fevers, chills, or increased cough.  She has been lightheaded upon standing for several days and had a syncopal episode just prior to arrival, falling and hitting her head on the bathtub.  She denies any change in vision or hearing, and denies focal numbness or weakness.  ED Course: Upon arrival to the ED, patient is found to be afebrile, saturating mid 90s on her usual supplemental oxygen, and with vitals otherwise stable.  EKG features a sinus rhythm with PVCs and T wave inversion in aVL, similar to prior.  Noncontrast head CT is negative for acute intracranial abnormality and cervical spine CT is negative for acute pathology.  CTA chest is negative for PE or other acute cardiopulmonary process.  Chemistry panel features a creatinine 1.02, similar to priors.  CBC is unremarkable.  Troponin and BNP are normal.  Patient will be observed on the telemetry unit for ongoing evaluation and management of intermittent chest pain and lightheadedness with syncope.  Review of Systems:  All other systems reviewed and apart from HPI, are negative.  Past Medical History:  Diagnosis Date  . Acute diastolic  CHF (congestive heart failure) ([place]) 08/24/2015  . Cervicalgia   . Chronic back pain   . COPD (chronic obstructive pulmonary disease) ([facility])    on 4L home O2  . Essential hypertension 08/24/2015  . GERD (gastroesophageal reflux disease)   . Headache(784.0)   . Heart murmur   . History of kidney stones   . Hyperlipidemia   . Migraine without aura, with intractable migraine, so stated, without mention of status migrainosus   . OSA on CPAP    she no longer wears CPAP  . Osteoarthritis   . Pulmonary embolism ([place])   . PVD (peripheral vascular disease) ([place])   . Seizures ([place]) 07/2014, 10/2014   due to baclofen  . Shortness of breath dyspnea   . Trigeminal neuralgia     Past Surgical History:  Procedure Laterality Date  . LEFT HEART CATH AND CORONARY ANGIOGRAPHY N/A 10/01/2017   Procedure: LEFT HEART CATH AND CORONARY ANGIOGRAPHY;  Surgeon: Leonie Man, MD;  Location: [facility];  Service: Cardiovascular;  Laterality: N/A;  . MULTIPLE TOOTH EXTRACTIONS     "they took out part of my teeth; I took out the rest when they got loose; was suppose to get dentures; never did"  . NO PAST SURGERIES    . URETEROSCOPY WITH HOLMIUM LASER LITHOTRIPSY Left 07/09/2017   Procedure: URETEROSCOPY WITH HOLMIUM LASER LITHOTRIPSY/ STENT;  Surgeon: Festus Aloe, MD;  Location: [facility];  Service: Urology;  Laterality: Left;  . URETEROSCOPY WITH HOLMIUM LASER LITHOTRIPSY Left 07/23/2017   Procedure: URETEROSCOPY WITH HOLMIUM LASER LITHOTRIPSY /STENT;  Surgeon: Junious Silk,  Rodman Key, MD;  Location: [facility];  Service: Urology;  Laterality: Left;  NEEDS 60 MINUTES FOR PROCEDURE     reports that she quit smoking about 7 years ago. Her smoking use included cigarettes. She has a 9.00 pack-year smoking history. She has never used smokeless tobacco. She reports that she does not drink alcohol or use drugs.  Allergies  Allergen Reactions  . Baclofen Other (See Comments)    Causes seizures  . Imdur  [Isosorbide Dinitrate] Other (See Comments)    Severe persistent headache  . Naproxen Palpitations    Family History  Problem Relation Age of Onset  . Heart attack Father   . Diabetes Mother   . Dementia Mother   . Diabetes Brother   . Heart disease Unknown   . Diabetes Unknown      Prior to Admission medications   Medication Sig Start Date End Date Taking? Authorizing Provider  carvedilol (COREG) 3.125 MG tablet TAKE 1 TABLET BY MOUTH 2 TIMES DAILY WITH A MEAL 04/14/18   Janith Lima, MD  docusate sodium (COLACE) 100 MG capsule Take 1 capsule (100 mg total) by mouth 2 (two) times daily. 04/01/18   Regalado, Cassie Freer, MD  ELIQUIS 5 MG TABS tablet TAKE 1 TABLET BY MOUTH TWICE A DAY 04/28/18   Janith Lima, MD  famotidine (PEPCID) 20 MG tablet Take 1 tablet (20 mg total) by mouth daily. 04/02/18   Regalado, Belkys A, MD  furosemide (LASIX) 40 MG tablet TAKE 1 TABLET (40 MG TOTAL) BY MOUTH DAILY. 04/20/18   Janith Lima, MD  gabapentin (NEURONTIN) 400 MG capsule Take 1 capsule (400 mg total) by mouth 3 (three) times daily. 04/14/18   Janith Lima, MD  ipratropium-albuterol (DUONEB) 0.5-2.5 (3) MG/3ML SOLN Take 3 mLs by nebulization 3 (three) times daily. 04/14/18   Janith Lima, MD  OXYGEN Inhale 4 L into the lungs.    [provider]  pantoprazole (PROTONIX) 20 MG tablet TAKE 1 TABLET BY MOUTH EVERY DAY 07/21/18   Lendon Colonel, NP  tiZANidine (ZANAFLEX) 2 MG tablet TAKE 1 TABLET (2 MG TOTAL) BY MOUTH EVERY 8 (EIGHT) HOURS AS NEEDED. FOR MIGRAINE 06/18/18   Janith Lima, MD  umeclidinium-vilanterol (ANORO ELLIPTA) 62.5-25 MCG/INH AEPB Inhale 1 puff into the lungs daily. 01/28/18   Janith Lima, MD  zolpidem (AMBIEN) 5 MG tablet Take 1 tablet (5 mg total) by mouth at bedtime as needed for sleep. 02/03/18   Janith Lima, MD    Physical Exam: Vitals:   07/28/18 1454  BP: (!) 162/102  Pulse: 73  Resp: (!) 23  Temp: 98.8 F (37.1 C)  TempSrc: Oral  SpO2:  97%  Weight: (!) 163.3 kg  Height: 5\' 5"  (1.651 m)    Constitutional: NAD, calm  Eyes: PERTLA, lids and conjunctivae normal ENMT: Mucous membranes are moist. Posterior pharynx clear of any exudate or lesions.   Neck: normal, supple, no masses, no thyromegaly Respiratory: clear to auscultation bilaterally, no wheezing, no crackles. Normal respiratory effort.    Cardiovascular: S1 & S2 heard, regular rate and rhythm. JVP not well-visualized.   Abdomen: No distension, no tenderness, soft. Bowel sounds normal.  Musculoskeletal: no clubbing / cyanosis. No joint deformity upper and lower extremities.    Skin: no significant rashes, lesions, ulcers. Warm, dry, well-perfused. Neurologic: No facial asymmetry. Sensation intact. Moving all extremities.  Psychiatric: Alert and oriented to person, place, and situation. Calm, cooperative.    Labs on  Admission: I have personally reviewed following labs and imaging studies  CBC: Recent Labs  Lab 07/28/18 1619  WBC 8.5  HGB 13.6  HCT 46.8*  MCV 91.2  PLT 175   Basic Metabolic Panel: Recent Labs  Lab 07/28/18 1619  NA 139  K 4.0  CL 102  CO2 27  GLUCOSE 110*  BUN 19  CREATININE 1.02*  CALCIUM 8.9   GFR: Estimated Creatinine Clearance: 80.6 mL/min (A) (by C-G formula based on SCr of 1.02 mg/dL (H)). Liver Function Tests: No results for input(s): AST, ALT, ALKPHOS, BILITOT, PROT, ALBUMIN in the last 168 hours. No results for input(s): LIPASE, AMYLASE in the last 168 hours. No results for input(s): AMMONIA in the last 168 hours. Coagulation Profile: No results for input(s): INR, PROTIME in the last 168 hours. Cardiac Enzymes: No results for input(s): CKTOTAL, CKMB, CKMBINDEX, TROPONINI in the last 168 hours. BNP (last 3 results) No results for input(s): PROBNP in the last 8760 hours. HbA1C: No results for input(s): HGBA1C in the last 72 hours. CBG: No results for input(s): GLUCAP in the last 168 hours. Lipid Profile: No results  for input(s): CHOL, HDL, LDLCALC, TRIG, CHOLHDL, LDLDIRECT in the last 72 hours. Thyroid Function Tests: No results for input(s): TSH, T4TOTAL, FREET4, T3FREE, THYROIDAB in the last 72 hours. Anemia Panel: No results for input(s): VITAMINB12, FOLATE, FERRITIN, TIBC, IRON, RETICCTPCT in the last 72 hours. Urine analysis:    Component Value Date/Time   COLORURINE AMBER (A) 10/01/2017 0318   APPEARANCEUR HAZY (A) 10/01/2017 0318   LABSPEC 1.024 10/01/2017 0318   PHURINE 5.0 10/01/2017 0318   GLUCOSEU NEGATIVE 10/01/2017 0318   GLUCOSEU NEGATIVE 05/20/2017 1705   HGBUR SMALL (A) 10/01/2017 0318   BILIRUBINUR NEGATIVE 10/01/2017 0318   KETONESUR NEGATIVE 10/01/2017 0318   PROTEINUR 30 (A) 10/01/2017 0318   UROBILINOGEN 0.2 05/20/2017 1705   NITRITE NEGATIVE 10/01/2017 0318   LEUKOCYTESUR MODERATE (A) 10/01/2017 0318   Sepsis Labs: @LABRCNTIP (procalcitonin:4,lacticidven:4) )No results found for this or any previous visit (from the past 240 hour(s)).   Radiological Exams on Admission: Ct Head Wo Contrast  Result Date: 07/28/2018 CLINICAL DATA:  Fall. Intermittent dizziness for the past few months. On Eliquis. EXAM: CT HEAD WITHOUT CONTRAST CT CERVICAL SPINE WITHOUT CONTRAST TECHNIQUE: Multidetector CT imaging of the head and cervical spine was performed following the standard protocol without intravenous contrast. Multiplanar CT image reconstructions of the cervical spine were also generated. COMPARISON:  MR brain dated October 28, 2017. FINDINGS: CT HEAD FINDINGS Brain: No evidence of acute infarction, hemorrhage, hydrocephalus, extra-axial collection or mass lesion/mass effect. Stable mild atrophy and chronic microvascular ischemic changes. Vascular: No hyperdense vessel or unexpected calcification. Skull: Normal. Negative for fracture or focal lesion. Sinuses/Orbits: No acute finding. Other: None. CT CERVICAL SPINE FINDINGS Alignment: No traumatic malalignment. Skull base and vertebrae: No  acute fracture. No primary bone lesion or focal pathologic process. Soft tissues and spinal canal: No prevertebral fluid or swelling. Medial deviation of the bilateral internal carotid arteries. No visible canal hematoma. Disc levels: Mild multilevel disc height loss and facet uncovertebral hypertrophy throughout the cervical spine. Upper chest: Negative. Other: None. IMPRESSION: 1. No acute intracranial abnormality. Stable mild cerebral atrophy and chronic microvascular ischemic changes. 2.  No acute cervical spine fracture.  Mild cervical spondylosis. Electronically Signed   By: Titus Dubin M.D.   On: 07/28/2018 18:35   Ct Angio Chest Pe W/cm &/or Wo Cm  Result Date: 07/28/2018 CLINICAL DATA:  Dyspnea EXAM: CT  ANGIOGRAPHY CHEST WITH CONTRAST TECHNIQUE: Multidetector CT imaging of the chest was performed using the standard protocol during bolus administration of intravenous contrast. Multiplanar CT image reconstructions and MIPs were obtained to evaluate the vascular anatomy. CONTRAST:  179mL ISOVUE-370 IOPAMIDOL (ISOVUE-370) INJECTION 76% COMPARISON:  03/27/2018 FINDINGS: Cardiovascular: Cardiomegaly with aortic atherosclerosis. No acute pulmonary embolus to the segmental level. Satisfactory opacification of pulmonary arteries. Streak artifacts from the patient's adjacent arm project over the left lower lobe pulmonary arteries and descending thoracic aorta. Mediastinum/Nodes: No enlarged mediastinal, hilar, or axillary lymph nodes. Thyroid gland, trachea, and esophagus demonstrate no significant findings. Lungs/Pleura: Low lung volumes are noted bilaterally with hypoventilatory change likely accounting for areas of ground-glass opacities noted throughout both lungs. An underlying alveolitis or pneumonitis would be difficult to entirely exclude. No effusion or pneumothorax. No dominant mass. Upper Abdomen: And nonobstructing calculi are suggested in the upper pole left kidney. Gallbladder appears  physiologically distended without mural thickening. Densities near the neck of the gallbladder may be due to adjacent vasculature. Tiny calculi are not excluded. Musculoskeletal: Thoracic spondylosis Review of the MIP images confirms the above findings. IMPRESSION: 1. No acute pulmonary embolus. Stable cardiomegaly with aortic atherosclerosis. 2. Left-sided nephrolithiasis without complicating features. Electronically Signed   By: Ashley Royalty M.D.   On: 07/28/2018 18:32   Ct Cervical Spine Wo Contrast  Result Date: 07/28/2018 CLINICAL DATA:  Fall. Intermittent dizziness for the past few months. On Eliquis. EXAM: CT HEAD WITHOUT CONTRAST CT CERVICAL SPINE WITHOUT CONTRAST TECHNIQUE: Multidetector CT imaging of the head and cervical spine was performed following the standard protocol without intravenous contrast. Multiplanar CT image reconstructions of the cervical spine were also generated. COMPARISON:  MR brain dated October 28, 2017. FINDINGS: CT HEAD FINDINGS Brain: No evidence of acute infarction, hemorrhage, hydrocephalus, extra-axial collection or mass lesion/mass effect. Stable mild atrophy and chronic microvascular ischemic changes. Vascular: No hyperdense vessel or unexpected calcification. Skull: Normal. Negative for fracture or focal lesion. Sinuses/Orbits: No acute finding. Other: None. CT CERVICAL SPINE FINDINGS Alignment: No traumatic malalignment. Skull base and vertebrae: No acute fracture. No primary bone lesion or focal pathologic process. Soft tissues and spinal canal: No prevertebral fluid or swelling. Medial deviation of the bilateral internal carotid arteries. No visible canal hematoma. Disc levels: Mild multilevel disc height loss and facet uncovertebral hypertrophy throughout the cervical spine. Upper chest: Negative. Other: None. IMPRESSION: 1. No acute intracranial abnormality. Stable mild cerebral atrophy and chronic microvascular ischemic changes. 2.  No acute cervical spine fracture.   Mild cervical spondylosis. Electronically Signed   By: Titus Dubin M.D.   On: 07/28/2018 18:35    EKG: Independently reviewed. Sinus rhythm, PVC's, LAD, T-wave inversion in aVL similar to prior.   Assessment/Plan  1. Syncope  - Presents with several days of lightheadedness, mainly on standing, with syncope just prior to arrival  - EKG similar to priors, CTA negative for PE  - Check orthostatic vitals, continue cardiac monitoring, check echocardiogram   2. Chest pain  - Reports intermittent chest pain over the past 2 days  - EKG with no acute ischemic features, initial troponin normal, CTA chest is negative for PE or acute cardiopulmonary disease  - She reports allergy to aspirin and nitrates  - Continue cardiac monitoring, check serial troponin measurements, repeat EKG    3. COPD; chronic hypoxic respiratory failure  - No cough or wheezing, but reports increased SOB last several days  - Requires 4 Lpm supplemental O2 at baseline  - Continue  supplemental O2, DuoNeb, as-needed albuterol   4. History of PE  - CTA chest neg for PE in ED  - Continue Eliquis   5. Chronic diastolic CHF  - Fluid-status difficult to determine clinically - Wt is up, but BNP normal and no edema on imaging  - Continue home-dose Lasix, continue Coreg  - Follow daily wts  - Echo is being updated as above    DVT prophylaxis: Lovenox Code Status: Full  Family Communication: Discussed with patient  Consults called: None Admission status: Observation     Vianne Bulls, MD [facility] Pager [phone]  If 7PM-7AM, please contact night-coverage [email or web address] Password [facility]  07/28/2018, 8:13 PM

## 2018-07-28 NOTE — ED Triage Notes (Signed)
[facility]- pt coming from extended stay hotel after an episode of dizziness which caused a fall. Pt was standing when she was dizzy. Pt has been having intermittent episodes of dizziness for several months. Pt hit her head, pain to the area. Pt takes eloquis.

## 2018-07-28 NOTE — Progress Notes (Signed)
Patient refused CPAP. Does not wear at home. Takes scheduled nebulizers at home TID.

## 2018-07-28 NOTE — ED Provider Notes (Signed)
[facility]PART[facility] Provider Note   CSN: [id number] Arrival date & time: 07/28/18  1446     History   Chief Complaint Chief Complaint  Patient presents with  . Fall    HPI Janice Morrow is a 72 y.o. female.  72 year old female with history of CHF, DVT on Eliquis presents with intermittent left-sided chest pain that radiates to her left arm and left neck times several days.  Symptoms last for 30 to 40 minutes.  Worse with exertion better with rest.  Today she was standing and became dizzy and felt like she was going to pass out fell and struck her head against a bathtub.  Denied any loss of conscious.  She currently denies any chest pain currently.  States she is not dyspneic.     Past Medical History:  Diagnosis Date  . Acute diastolic CHF (congestive heart failure) ([place]) 08/24/2015  . Cervicalgia   . Chronic back pain   . COPD (chronic obstructive pulmonary disease) ([facility])    on 4L home O2  . Essential hypertension 08/24/2015  . GERD (gastroesophageal reflux disease)   . Headache(784.0)   . Heart murmur   . History of kidney stones   . Hyperlipidemia   . Migraine without aura, with intractable migraine, so stated, without mention of status migrainosus   . OSA on CPAP    she no longer wears CPAP  . Osteoarthritis   . Pulmonary embolism ([place])   . PVD (peripheral vascular disease) ([place])   . Seizures ([place]) 07/2014, 10/2014   due to baclofen  . Shortness of breath dyspnea   . Trigeminal neuralgia     Patient Active Problem List   Diagnosis Date Noted  . Cough 03/06/2018  . Insomnia secondary to chronic pain 02/03/2018  . Cervical disc disease with myelopathy 01/28/2018  . Primary osteoarthritis of both knees 10/14/2017  . Type 2 diabetes mellitus with complication, without long-term current use of insulin ([place]) 05/20/2017  . History of pulmonary embolism 01/30/2017  . Kidney stone on left side   . COPD (chronic obstructive pulmonary  disease) ([place]) 08/24/2015  . Essential hypertension 08/24/2015  . Seizures ([place]) 08/24/2015  . Obesity hypoventilation syndrome ([place]) 08/24/2015  . Migraine without aura and with status migrainosus, not intractable 07/12/2015  . OSA on CPAP 07/12/2015  . Left ventricular diastolic dysfunction, NYHA class 1 04/11/2015  . Visit for screening mammogram 03/08/2015  . Trigeminal neuralgia of right side of face 11/23/2014  . Chronic back pain 11/26/2013  . Hyperlipidemia with target LDL less than 130 09/01/2010  . Severe obesity (BMI >= 40) ([place]) 09/01/2010  . GERD 09/01/2010    Past Surgical History:  Procedure Laterality Date  . LEFT HEART CATH AND CORONARY ANGIOGRAPHY N/A 10/01/2017   Procedure: LEFT HEART CATH AND CORONARY ANGIOGRAPHY;  Surgeon: Leonie Man, MD;  Location: [facility];  Service: Cardiovascular;  Laterality: N/A;  . MULTIPLE TOOTH EXTRACTIONS     "they took out part of my teeth; I took out the rest when they got loose; was suppose to get dentures; never did"  . NO PAST SURGERIES    . URETEROSCOPY WITH HOLMIUM LASER LITHOTRIPSY Left 07/09/2017   Procedure: URETEROSCOPY WITH HOLMIUM LASER LITHOTRIPSY/ STENT;  Surgeon: Festus Aloe, MD;  Location: [facility];  Service: Urology;  Laterality: Left;  . URETEROSCOPY WITH HOLMIUM LASER LITHOTRIPSY Left 07/23/2017   Procedure: URETEROSCOPY WITH HOLMIUM LASER LITHOTRIPSY /STENT;  Surgeon: Festus Aloe, MD;  Location:  [facility];  Service: Urology;  Laterality: Left;  NEEDS 60 MINUTES FOR PROCEDURE     OB History    Gravida  4   Para  1   Term  1   Preterm      AB  3   Living  0     SAB  3   TAB      Ectopic      Multiple      Live Births           Obstetric Comments  Dec in [place] Medications    Prior to Admission medications   Medication Sig Start Date End Date Taking? Authorizing Provider  carvedilol (COREG) 3.125 MG tablet TAKE 1 TABLET BY MOUTH 2 TIMES DAILY WITH A  MEAL 04/14/18   Janith Lima, MD  docusate sodium (COLACE) 100 MG capsule Take 1 capsule (100 mg total) by mouth 2 (two) times daily. 04/01/18   Regalado, Cassie Freer, MD  ELIQUIS 5 MG TABS tablet TAKE 1 TABLET BY MOUTH TWICE A DAY 04/28/18   Janith Lima, MD  famotidine (PEPCID) 20 MG tablet Take 1 tablet (20 mg total) by mouth daily. 04/02/18   Regalado, Belkys A, MD  furosemide (LASIX) 40 MG tablet TAKE 1 TABLET (40 MG TOTAL) BY MOUTH DAILY. 04/20/18   Janith Lima, MD  gabapentin (NEURONTIN) 400 MG capsule Take 1 capsule (400 mg total) by mouth 3 (three) times daily. 04/14/18   Janith Lima, MD  ipratropium-albuterol (DUONEB) 0.5-2.5 (3) MG/3ML SOLN Take 3 mLs by nebulization 3 (three) times daily. 04/14/18   Janith Lima, MD  OXYGEN Inhale 4 L into the lungs.    [provider]  pantoprazole (PROTONIX) 20 MG tablet TAKE 1 TABLET BY MOUTH EVERY DAY 07/21/18   Lendon Colonel, NP  tiZANidine (ZANAFLEX) 2 MG tablet TAKE 1 TABLET (2 MG TOTAL) BY MOUTH EVERY 8 (EIGHT) HOURS AS NEEDED. FOR MIGRAINE 06/18/18   Janith Lima, MD  umeclidinium-vilanterol (ANORO ELLIPTA) 62.5-25 MCG/INH AEPB Inhale 1 puff into the lungs daily. 01/28/18   Janith Lima, MD  zolpidem (AMBIEN) 5 MG tablet Take 1 tablet (5 mg total) by mouth at bedtime as needed for sleep. 02/03/18   Janith Lima, MD    Family History Family History  Problem Relation Age of Onset  . Heart attack Father   . Diabetes Mother   . Dementia Mother   . Diabetes Brother   . Heart disease Unknown   . Diabetes Unknown     Social History Social History   Tobacco Use  . Smoking status: Former Smoker    Packs/day: 0.30    Years: 30.00    Pack years: 9.00    Types: Cigarettes    Last attempt to quit: 08/22/2010    Years since quitting: 7.9  . Smokeless tobacco: Never Used  Substance Use Topics  . Alcohol use: No    Alcohol/week: 0.0 standard drinks  . Drug use: No     Allergies   Baclofen; Imdur [isosorbide  dinitrate]; and Naproxen   Review of Systems Review of Systems  All other systems reviewed and are negative.    Physical Exam Updated Vital Signs BP (!) 162/102 (BP Location: Right Arm)   Pulse 73   Temp 98.8 F (37.1 C) (Oral)   Resp (!) 23   Ht 1.651 m (5\' 5" )   Wt (!) 163.3  kg   LMP 08/27/1993 (Approximate)   SpO2 97%   BMI 59.91 kg/m   Physical Exam  Constitutional: She is oriented to person, place, and time. She appears well-developed and well-nourished.  Non-toxic appearance. No distress.  HENT:  Head: Normocephalic and atraumatic.  Eyes: Pupils are equal, round, and reactive to light. Conjunctivae, EOM and lids are normal.  Neck: Normal range of motion. Neck supple. No tracheal deviation present. No thyroid mass present.    Cardiovascular: Normal rate, regular rhythm and normal heart sounds. Exam reveals no gallop.  No murmur heard. Pulmonary/Chest: Effort normal and breath sounds normal. No stridor. No respiratory distress. She has no decreased breath sounds. She has no wheezes. She has no rhonchi. She has no rales.  Abdominal: Soft. Normal appearance and bowel sounds are normal. She exhibits no distension. There is no tenderness. There is no rebound and no CVA tenderness.  Musculoskeletal: Normal range of motion. She exhibits no edema or tenderness.  Neurological: She is alert and oriented to person, place, and time. She has normal strength. No cranial nerve deficit or sensory deficit. GCS eye subscore is 4. GCS verbal subscore is 5. GCS motor subscore is 6.  Skin: Skin is warm and dry. No abrasion and no rash noted.  Psychiatric: She has a normal mood and affect. Her speech is normal and behavior is normal.  Nursing note and vitals reviewed.    ED Treatments / Results  Labs (all labs ordered are listed, but only abnormal results are displayed) Labs Reviewed  BASIC METABOLIC PANEL  CBC  I-STAT TROPONIN, ED    EKG EKG Interpretation  Date/Time:  Monday  July 28 2018 14:52:35 EST Ventricular Rate:  72 PR Interval:    QRS Duration: 101 QT Interval:  427 QTC Calculation: 468 R Axis:   -32 Text Interpretation:  Sinus rhythm Multiple premature complexes, vent & supraven Left axis deviation Confirmed by Lajean Saver [phone]) on 07/28/2018 3:11:34 PM   Radiology No results found.  Procedures Procedures (including critical care time)  Medications Ordered in ED Medications - No data to display   Initial Impression / Assessment and Plan / ED Course  I have reviewed the triage vital signs and the nursing notes.  Pertinent labs & imaging results that were available during my care of the patient were reviewed by me and considered in my medical decision making (see chart for details).     Patient presented initially with having near syncopal events with some chest pain.  History of PE in the past.  Chest CT here is negative.  Symptoms concerning for anginal etiology and will admit for chest pain rule out  Final Clinical Impressions(s) / ED Diagnoses   Final diagnoses:  None    ED Discharge Orders    None       Lacretia Leigh, MD 07/28/18 1942

## 2018-07-28 NOTE — ED Notes (Deleted)
PAGED PTAR FOR PT TRANSPORT

## 2018-07-28 NOTE — ED Notes (Signed)
c-collar placed per Dr Zenia Resides

## 2018-07-29 ENCOUNTER — Ambulatory Visit (HOSPITAL_BASED_OUTPATIENT_CLINIC_OR_DEPARTMENT_OTHER): Payer: Medicare Other

## 2018-07-29 ENCOUNTER — Encounter (HOSPITAL_COMMUNITY): Payer: Self-pay | Admitting: Cardiology

## 2018-07-29 DIAGNOSIS — I700 Atherosclerosis of aorta: Secondary | ICD-10-CM | POA: Diagnosis not present

## 2018-07-29 DIAGNOSIS — R2681 Unsteadiness on feet: Secondary | ICD-10-CM | POA: Diagnosis not present

## 2018-07-29 DIAGNOSIS — J42 Unspecified chronic bronchitis: Secondary | ICD-10-CM | POA: Diagnosis not present

## 2018-07-29 DIAGNOSIS — R0782 Intercostal pain: Secondary | ICD-10-CM | POA: Diagnosis not present

## 2018-07-29 DIAGNOSIS — Z86711 Personal history of pulmonary embolism: Secondary | ICD-10-CM | POA: Diagnosis not present

## 2018-07-29 DIAGNOSIS — I420 Dilated cardiomyopathy: Secondary | ICD-10-CM | POA: Diagnosis not present

## 2018-07-29 DIAGNOSIS — I110 Hypertensive heart disease with heart failure: Secondary | ICD-10-CM | POA: Diagnosis not present

## 2018-07-29 DIAGNOSIS — R55 Syncope and collapse: Secondary | ICD-10-CM | POA: Diagnosis not present

## 2018-07-29 DIAGNOSIS — G4733 Obstructive sleep apnea (adult) (pediatric): Secondary | ICD-10-CM | POA: Diagnosis not present

## 2018-07-29 DIAGNOSIS — M6281 Muscle weakness (generalized): Secondary | ICD-10-CM | POA: Diagnosis not present

## 2018-07-29 DIAGNOSIS — G319 Degenerative disease of nervous system, unspecified: Secondary | ICD-10-CM | POA: Diagnosis not present

## 2018-07-29 DIAGNOSIS — I5042 Chronic combined systolic (congestive) and diastolic (congestive) heart failure: Secondary | ICD-10-CM | POA: Diagnosis not present

## 2018-07-29 DIAGNOSIS — W19XXXA Unspecified fall, initial encounter: Secondary | ICD-10-CM | POA: Diagnosis present

## 2018-07-29 DIAGNOSIS — I6782 Cerebral ischemia: Secondary | ICD-10-CM | POA: Diagnosis not present

## 2018-07-29 DIAGNOSIS — J9611 Chronic respiratory failure with hypoxia: Secondary | ICD-10-CM | POA: Diagnosis not present

## 2018-07-29 DIAGNOSIS — I10 Essential (primary) hypertension: Secondary | ICD-10-CM

## 2018-07-29 DIAGNOSIS — R42 Dizziness and giddiness: Secondary | ICD-10-CM | POA: Diagnosis not present

## 2018-07-29 DIAGNOSIS — I491 Atrial premature depolarization: Secondary | ICD-10-CM | POA: Diagnosis not present

## 2018-07-29 LAB — TROPONIN I
Troponin I: <0.03 ng/mL (ref <0.03)
Troponin I: <0.03 ng/mL (ref <0.03)

## 2018-07-29 LAB — ECHOCARDIOGRAM COMPLETE
HEIGHTINCHES: 65 in
WEIGHTICAEL: 5486.4 [oz_av]

## 2018-07-29 MED ORDER — IPRATROPIUM-ALBUTEROL 0.5-2.5 (3) MG/3ML IN SOLN
3.0000 mL | Freq: Three times a day (TID) | RESPIRATORY_TRACT | Status: DC
  Administered 2018-07-29 – 2018-07-30 (×2): 3 mL via RESPIRATORY_TRACT
  Filled 2018-07-29 (×3): qty 3

## 2018-07-29 NOTE — Progress Notes (Signed)
  Echocardiogram 2D Echocardiogram has been performed.  Darlina Sicilian M 07/29/2018, 10:56 AM

## 2018-07-29 NOTE — Consult Note (Signed)
Cardiology Consultation:   Patient ID: [name] MRN: [id number]; DOB: [birth year]  Admit date: 07/28/2018 Date of Consult: 07/29/2018  Primary Care Provider: Janith Lima, MD Primary Cardiologist: Janice Ruths, MD  Primary Electrophysiologist:  None    Patient Profile:   Janice Morrow is a 73 y.o. female with a hx of NSTEMI, chronic combined systolic and diastolic HF, COPD, OSA on CPAP, HTN, PE, obesity hypoventilation syndrome and minimal CAD on cath 09/2017 with moderately to severely elevated LVEDP who is being seen today for the evaluation of syncope at the request of Dr Janice Morrow.  History of Present Illness:   Janice Morrow with above hx was admitted 07/28/2018 with dizziness and a fall. She noted she had several days of lightheadedness especially with standing.  She also complained of chest pain with rest and activity.  Chest pain lasts 30 min and radiates toward Lt arm and associated with SOB but no nausea or diaphoresis. She stated she passed out and fell today she tells me she was dizzy, room spinning and then fell, she remembers hitting the floor. .  She hit her head on a bath tub, she is on Eliquis.    In ER EKG SR with PVCs and PACs same as EKG in 03/2018.  I personally reviewed.  Today's is similar EKG Tele: I personally reviewed and SR with PACs and PVCs  Troponin 0.01, and <0.03 X2  Na 139, + K 4.0   BNP 43.5 Cr 1.02 Hgb 13.6, plts 181.  CTA chest  IMPRESSION: 1. No acute pulmonary embolus. Stable cardiomegaly with aortic atherosclerosis. 2. Left-sided nephrolithiasis without complicating features.  Echo has been done results pending.  Orthostatic BP evaluated and BP went up from lying to sitting but could not stand for BP check. No explantation if dizzy or what.     Prior echo 10/2017 with EF 55-60% with mild concentric hypertrophy, G1DD, LA moderately dilated.  RA moderately dilated. PA pk pressure of 49 mmHg.  And cardiac cath 09/2017 with minimal disease and  LVEDP of 25-30 also 2+ MR.    Currently pt was sleeping with apnea.  During exam, she fell asleep when I asked questions.  Had to shake her several times to ask questions.  She is not very active at home, can go from room to room and does [place] but has a chair to sit in with cooking.  With the dizziness she was not wearing oxygen. Usually she wears 24/7.    Past Medical History:  Diagnosis Date  . Acute diastolic CHF (congestive heart failure) ([place]) 08/24/2015  . Cervicalgia   . Chronic back pain   . COPD (chronic obstructive pulmonary disease) ([facility])    on 4L home O2  . Essential hypertension 08/24/2015  . GERD (gastroesophageal reflux disease)   . Headache(784.0)   . Heart murmur   . History of kidney stones   . Hyperlipidemia   . Migraine without aura, with intractable migraine, so stated, without mention of status migrainosus   . OSA on CPAP    she no longer wears CPAP  . Osteoarthritis   . Pulmonary embolism ([place])   . PVD (peripheral vascular disease) ([place])   . Seizures ([place]) 07/2014, 10/2014   due to baclofen  . Shortness of breath dyspnea   . Trigeminal neuralgia     Past Surgical History:  Procedure Laterality Date  . LEFT HEART CATH AND CORONARY ANGIOGRAPHY N/A 10/01/2017   Procedure: LEFT HEART CATH AND CORONARY ANGIOGRAPHY;  Surgeon: Janice Man, MD;  Location: [facility];  Service: Cardiovascular;  Laterality: N/A;  . MULTIPLE TOOTH EXTRACTIONS     "they took out part of my teeth; I took out the rest when they got loose; was suppose to get dentures; never did"  . NO PAST SURGERIES    . URETEROSCOPY WITH HOLMIUM LASER LITHOTRIPSY Left 07/09/2017   Procedure: URETEROSCOPY WITH HOLMIUM LASER LITHOTRIPSY/ STENT;  Surgeon: Janice Aloe, MD;  Location: [facility];  Service: Urology;  Laterality: Left;  . URETEROSCOPY WITH HOLMIUM LASER LITHOTRIPSY Left 07/23/2017   Procedure: URETEROSCOPY WITH HOLMIUM LASER LITHOTRIPSY /STENT;  Surgeon: Janice Aloe, MD;   Location: [facility];  Service: Urology;  Laterality: Left;  NEEDS 60 MINUTES FOR PROCEDURE     Home Medications:  Prior to Admission medications   Medication Sig Start Date End Date Taking? Authorizing Provider  Aspirin-Acetaminophen-Caffeine (GOODY HEADACHE PO) Take 2 packets by mouth daily as needed (headache).   Yes [provider]  carvedilol (COREG) 3.125 MG tablet TAKE 1 TABLET BY MOUTH 2 TIMES DAILY WITH A MEAL Patient taking differently: Take 3.125 mg by mouth 2 (two) times daily with a meal.  04/14/18  Yes Janice Lima, MD  ELIQUIS 5 MG TABS tablet TAKE 1 TABLET BY MOUTH TWICE A DAY Patient taking differently: Take 5 mg by mouth 2 (two) times daily.  04/28/18  Yes Janice Lima, MD  furosemide (LASIX) 40 MG tablet TAKE 1 TABLET (40 MG TOTAL) BY MOUTH DAILY. Patient taking differently: Take 40 mg by mouth daily as needed for fluid or edema.  04/20/18  Yes Janice Lima, MD  gabapentin (NEURONTIN) 400 MG capsule Take 1 capsule (400 mg total) by mouth 3 (three) times daily. Patient taking differently: Take 400 mg by mouth 2 (two) times daily.  04/14/18  Yes Janice Lima, MD  ipratropium-albuterol (DUONEB) 0.5-2.5 (3) MG/3ML SOLN Take 3 mLs by nebulization 3 (three) times daily. Patient taking differently: Take 3 mLs by nebulization 3 (three) times daily as needed (shortness of breath).  04/14/18  Yes Janice Lima, MD  OXYGEN Inhale 4 L into the lungs continuous.    Yes [provider]  pantoprazole (PROTONIX) 20 MG tablet TAKE 1 TABLET BY MOUTH EVERY DAY Patient taking differently: Take 20 mg by mouth daily as needed for heartburn.  07/21/18  Yes Lendon Colonel, NP  tiZANidine (ZANAFLEX) 2 MG tablet TAKE 1 TABLET (2 MG TOTAL) BY MOUTH EVERY 8 (EIGHT) HOURS AS NEEDED. FOR MIGRAINE Patient taking differently: Take 2 mg by mouth every 8 (eight) hours as needed for muscle spasms.  06/18/18  Yes Janice Lima, MD  umeclidinium-vilanterol (ANORO ELLIPTA) 62.5-25  MCG/INH AEPB Inhale 1 puff into the lungs daily. 01/28/18  Yes Janice Lima, MD  docusate sodium (COLACE) 100 MG capsule Take 1 capsule (100 mg total) by mouth 2 (two) times daily. Patient not taking: Reported on 07/28/2018 04/01/18   Regalado, Jerald Kief A, MD  famotidine (PEPCID) 20 MG tablet Take 1 tablet (20 mg total) by mouth daily. Patient not taking: Reported on 07/28/2018 04/02/18   Regalado, Jerald Kief A, MD  zolpidem (AMBIEN) 5 MG tablet Take 1 tablet (5 mg total) by mouth at bedtime as needed for sleep. Patient not taking: Reported on 07/28/2018 02/03/18   Janice Lima, MD    Inpatient Medications: Scheduled Meds: . apixaban  5 mg Oral BID  . carvedilol  3.125 mg Oral BID WC  . docusate  sodium  100 mg Oral BID  . famotidine  20 mg Oral Daily  . furosemide  40 mg Oral Daily  . gabapentin  400 mg Oral TID  . ipratropium-albuterol  3 mL Nebulization TID  . pantoprazole  20 mg Oral Daily   Continuous Infusions:  PRN Meds: acetaminophen, albuterol, ondansetron (ZOFRAN) IV, tiZANidine, zolpidem  Allergies:    Allergies  Allergen Reactions  . Baclofen Other (See Comments)    Causes seizures  . Imdur [Isosorbide Dinitrate] Other (See Comments)    Severe persistent headache  . Naproxen Palpitations    Social History:   Social History   Socioeconomic History  . Marital status: Single    Spouse name: Not on file  . Number of children: 1  . Years of education: 12th  . Highest education level: Not on file  Occupational History  . Occupation: DISABLED    Employer: UNEMPLOYED  Social Needs  . Financial resource strain: Not very hard  . Food insecurity:    Worry: Never true    Inability: Never true  . Transportation needs:    Medical: No    Non-medical: No  Tobacco Use  . Smoking status: Former Smoker    Packs/day: 0.30    Years: 30.00    Pack years: 9.00    Types: Cigarettes    Last attempt to quit: 08/22/2010    Years since quitting: 7.9  . Smokeless tobacco: Never  Used  Substance and Sexual Activity  . Alcohol use: No    Alcohol/week: 0.0 standard drinks  . Drug use: No  . Sexual activity: Never  Lifestyle  . Physical activity:    Days per week: 0 days    Minutes per session: 0 min  . Stress: Only a little  Relationships  . Social connections:    Talks on phone: More than three times a week    Gets together: More than three times a week    Attends religious service: More than 4 times per year    Active member of club or organization: No    Attends meetings of clubs or organizations: Never    Relationship status: Patient refused  . Intimate partner violence:    Fear of current or ex partner: No    Emotionally abused: No    Physically abused: No    Forced sexual activity: No  Other Topics Concern  . Not on file  Social History Narrative   Patient lives at home with her brother.    Disabled.   Education high school.   Right handed.   Caffeine Use: 5-6 20oz bottle sodas daily    Family History:    Family History  Problem Relation Age of Onset  . Heart attack Father   . Diabetes Mother   . Dementia Mother   . Diabetes Brother   . Heart disease Unknown   . Diabetes Unknown      ROS:  Please see the history of present illness.  General:no colds or fevers, no weight changes Skin:no rashes or ulcers HEENT:no blurred vision, no congestion CV:see HPI PUL:see HPI GI:no diarrhea constipation or melena, no indigestion GU:no hematuria, no dysuria MS:no joint pain, no claudication Neuro:no syncope, + lightheadedness Endo:no diabetes, no thyroid disease  All other ROS reviewed and negative.     Physical Exam/Data:   Vitals:   07/29/18 0109 07/29/18 0506 07/29/18 0522 07/29/18 0844  BP: 136/74 (!) 166/83    Pulse: 78 85  89  Resp: 20 20  Temp: 98.2 F (36.8 C) 98.6 F (37 C)    TempSrc: Oral Oral    SpO2: 95% 93%    Weight:   (!) 155.5 kg   Height:        Intake/Output Summary (Last 24 hours) at 07/29/2018 1153 Last  data filed at 07/29/2018 0600 Gross per 24 hour  Intake 240 ml  Output 800 ml  Net -560 ml   Filed Weights   07/28/18 1454 07/28/18 2303 07/29/18 0522  Weight: (!) 163.3 kg (!) 157.2 kg (!) 155.5 kg   Body mass index is 57.06 kg/m.  General:  Well nourished, well developed, in no acute distress but falls to sleep when talking HEENT: normal Lymph: no adenopathy Neck: no JVD sitting up in a chair Endocrine:  No thryomegaly Vascular: No carotid bruits; pedal pulses 2+ bilaterally  Cardiac:  normal S1, S2; RRR; no murmur due to muffled heart sounds.  Lungs:  clear to auscultation bilaterally, no wheezing, rhonchi or rales but again distant breath sounds Abd: soft, obese,nontender, no hepatomegaly  Ext: no edema- currently, did prior to admit in legs Musculoskeletal:  No deformities, BUE and BLE strength normal and equal Skin: warm and dry  Neuro:  alert and oriented X 3 MAE follows commands, no focal abnormalities noted Psych:  Normal affect     Relevant CV Studies: Cardiac cath 10/01/17  There is mild to moderate left ventricular systolic dysfunction.  LV end diastolic pressure is severely elevated. EDP 25-30 mmHg  The left ventricular ejection fraction is 40-45% by visual estimate. Global hypokinesis  There is trace-mild (2+) mitral regurgitation.  Ost Cx to Prox Cx lesion is 30% stenosed. Otherwise minimal CAD with a large wraparound LAD.   Angiographically minimal CAD with right dominant system.,  But large wraparound LAD. Moderate to severely elevated LVEDP.  Suspect troponin elevation was related to microvascular ischemia in the setting of acute (on chronic) combined systolic and diastolic heart failure. Unable to obtain venous access for right heart cath due to patient discomfort and respiratory distress, requiring nonrebreather oxygen I did not feel it was safe to keep her on the table to attempt further venous access after several attempts were made using  ultrasound.  Plan:  Patient will be admitted to stepdown unit for IV diuresis.  I written for 40 mg IV Lasix twice daily.  Based on that positive troponin and unclear etiology, I have written to restart IV heparin 8 hours after TR band removal.  Will defer duration of treatment to primary cardiology team.  Defer further management to primary team  Consider evaluation for sleep apnea using CPAP based on patient's significant orthopnea.  Echo 10/2017 Study Conclusions  - Left ventricle: The cavity size was normal. There was mild   concentric hypertrophy. Systolic function was normal. The   estimated ejection fraction was in the range of 55% to 60%. Wall   motion was normal; there were no regional wall motion   abnormalities. Doppler parameters are consistent with abnormal   left ventricular relaxation (grade 1 diastolic dysfunction).   Doppler parameters are consistent with indeterminate ventricular   filling pressure. - Aortic valve: Transvalvular velocity was within the normal range.   There was no stenosis. There was no regurgitation. - Mitral valve: Calcified annulus. Transvalvular velocity was   within the normal range. There was no evidence for stenosis.   There was trivial regurgitation. - Left atrium: The atrium was moderately dilated. - Right ventricle: The cavity size was moderately dilated.  Wall   thickness was normal. Systolic function was normal. - Right atrium: The atrium was moderately dilated. - Tricuspid valve: There was trivial regurgitation. - Pulmonary arteries: Systolic pressure was mildly increased. PA   peak pressure: 49 mm Hg (S). - Pericardium, extracardiac: A trivial pericardial effusion was   identified.  Laboratory Data:  Chemistry Recent Labs  Lab 07/28/18 1619  NA 139  K 4.0  CL 102  CO2 27  GLUCOSE 110*  BUN 19  CREATININE 1.02*  CALCIUM 8.9  GFRNONAA 56*  GFRAA >60  ANIONGAP 10    No results for input(s): PROT, ALBUMIN, AST,  ALT, ALKPHOS, BILITOT in the last 168 hours. Hematology Recent Labs  Lab 07/28/18 1619  WBC 8.5  RBC 5.13*  HGB 13.6  HCT 46.8*  MCV 91.2  MCH 26.5  MCHC 29.1*  RDW 14.6  PLT 181   Cardiac Enzymes Recent Labs  Lab 07/28/18 2243 07/29/18 0523  TROPONINI <0.03 <0.03    Recent Labs  Lab 07/28/18 1623  TROPIPOC 0.01    BNP Recent Labs  Lab 07/28/18 1619  BNP 43.5    DDimer No results for input(s): DDIMER in the last 168 hours.  Radiology/Studies:  Ct Head Wo Contrast  Result Date: 07/28/2018 CLINICAL DATA:  Fall. Intermittent dizziness for the past few months. On Eliquis. EXAM: CT HEAD WITHOUT CONTRAST CT CERVICAL SPINE WITHOUT CONTRAST TECHNIQUE: Multidetector CT imaging of the head and cervical spine was performed following the standard protocol without intravenous contrast. Multiplanar CT image reconstructions of the cervical spine were also generated. COMPARISON:  MR brain dated October 28, 2017. FINDINGS: CT HEAD FINDINGS Brain: No evidence of acute infarction, hemorrhage, hydrocephalus, extra-axial collection or mass lesion/mass effect. Stable mild atrophy and chronic microvascular ischemic changes. Vascular: No hyperdense vessel or unexpected calcification. Skull: Normal. Negative for fracture or focal lesion. Sinuses/Orbits: No acute finding. Other: None. CT CERVICAL SPINE FINDINGS Alignment: No traumatic malalignment. Skull base and vertebrae: No acute fracture. No primary bone lesion or focal pathologic process. Soft tissues and spinal canal: No prevertebral fluid or swelling. Medial deviation of the bilateral internal carotid arteries. No visible canal hematoma. Disc levels: Mild multilevel disc height loss and facet uncovertebral hypertrophy throughout the cervical spine. Upper chest: Negative. Other: None. IMPRESSION: 1. No acute intracranial abnormality. Stable mild cerebral atrophy and chronic microvascular ischemic changes. 2.  No acute cervical spine fracture.  Mild  cervical spondylosis. Electronically Signed   By: Titus Dubin M.D.   On: 07/28/2018 18:35   Ct Angio Chest Pe W/cm &/or Wo Cm  Result Date: 07/28/2018 CLINICAL DATA:  Dyspnea EXAM: CT ANGIOGRAPHY CHEST WITH CONTRAST TECHNIQUE: Multidetector CT imaging of the chest was performed using the standard protocol during bolus administration of intravenous contrast. Multiplanar CT image reconstructions and MIPs were obtained to evaluate the vascular anatomy. CONTRAST:  176mL ISOVUE-370 IOPAMIDOL (ISOVUE-370) INJECTION 76% COMPARISON:  03/27/2018 FINDINGS: Cardiovascular: Cardiomegaly with aortic atherosclerosis. No acute pulmonary embolus to the segmental level. Satisfactory opacification of pulmonary arteries. Streak artifacts from the patient's adjacent arm project over the left lower lobe pulmonary arteries and descending thoracic aorta. Mediastinum/Nodes: No enlarged mediastinal, hilar, or axillary lymph nodes. Thyroid gland, trachea, and esophagus demonstrate no significant findings. Lungs/Pleura: Low lung volumes are noted bilaterally with hypoventilatory change likely accounting for areas of ground-glass opacities noted throughout both lungs. An underlying alveolitis or pneumonitis would be difficult to entirely exclude. No effusion or pneumothorax. No dominant mass. Upper Abdomen: And nonobstructing calculi are suggested in  the upper pole left kidney. Gallbladder appears physiologically distended without mural thickening. Densities near the neck of the gallbladder may be due to adjacent vasculature. Tiny calculi are not excluded. Musculoskeletal: Thoracic spondylosis Review of the MIP images confirms the above findings. IMPRESSION: 1. No acute pulmonary embolus. Stable cardiomegaly with aortic atherosclerosis. 2. Left-sided nephrolithiasis without complicating features. Electronically Signed   By: Ashley Royalty M.D.   On: 07/28/2018 18:32   Ct Cervical Spine Wo Contrast  Result Date: 07/28/2018 CLINICAL  DATA:  Fall. Intermittent dizziness for the past few months. On Eliquis. EXAM: CT HEAD WITHOUT CONTRAST CT CERVICAL SPINE WITHOUT CONTRAST TECHNIQUE: Multidetector CT imaging of the head and cervical spine was performed following the standard protocol without intravenous contrast. Multiplanar CT image reconstructions of the cervical spine were also generated. COMPARISON:  MR brain dated October 28, 2017. FINDINGS: CT HEAD FINDINGS Brain: No evidence of acute infarction, hemorrhage, hydrocephalus, extra-axial collection or mass lesion/mass effect. Stable mild atrophy and chronic microvascular ischemic changes. Vascular: No hyperdense vessel or unexpected calcification. Skull: Normal. Negative for fracture or focal lesion. Sinuses/Orbits: No acute finding. Other: None. CT CERVICAL SPINE FINDINGS Alignment: No traumatic malalignment. Skull base and vertebrae: No acute fracture. No primary bone lesion or focal pathologic process. Soft tissues and spinal canal: No prevertebral fluid or swelling. Medial deviation of the bilateral internal carotid arteries. No visible canal hematoma. Disc levels: Mild multilevel disc height loss and facet uncovertebral hypertrophy throughout the cervical spine. Upper chest: Negative. Other: None. IMPRESSION: 1. No acute intracranial abnormality. Stable mild cerebral atrophy and chronic microvascular ischemic changes. 2.  No acute cervical spine fracture.  Mild cervical spondylosis. Electronically Signed   By: Titus Dubin M.D.   On: 07/28/2018 18:35    Assessment and Plan:   1. Dizziness/syncope she remembers hitting the floor, so more dizziness with loss of balance and fall- she tells me she did not have oxygen on.  Possible due to hypoxia.  No arrhythmias on tele.except her usual occ PAC and PVC.  2. Chest pain with neg MI and minimal CAD on cardiac cath 09/2017.  3. HTN on arrival at 162/102  Unable to do orthostatic BP will try again today.   4. +PACs and PVCs on tele, but have  always been present. 5. Pulmonary hypertension  6. COPD on home oxygen with chronic respiratory failure to to hypoventilation syndrome.  Per IM  7. OSA needs CPAP when sitting in chair.  She has apnea and falls to sleep with talking. I ordered CPAP      For questions or updates, please contact [facility] Please consult [email or web address] for contact info under     Signed, Cecilie Kicks, NP  07/29/2018 11:53 AM

## 2018-07-29 NOTE — Plan of Care (Signed)

## 2018-07-29 NOTE — Progress Notes (Signed)
[facility] PROGRESS NOTE  Janice Morrow WCH:[id number] DOB: 08-Nov-1945 DOA: 07/28/2018 PCP: Janith Lima, MD  Assessment/Plan:  1. Syncope  - Presented with several days of lightheadedness, mainly on standing, with syncope just prior to arrival. EKG similar to priors, CTA negative for PE.Orthostatic vitals difficult to obtain. No events on tele. continue cardiac monitoring,  echocardiogram results pending. Troponin neg.evaluated by cards who opine more like a fall/loss of balance possibly due to non-compliance with oxygen. -obtain orthostatics as able -monitor BP closely -PT eval  2. Chest pain  -resolved this am.  - EKG with no acute ischemic features, initial troponin normal, CTA chest is negative for PE or acute cardiopulmonary disease  - She reports allergy to aspirin and nitrates  - evaluated by cards who opine minimal CAD on cath 09/2017 -follow echo results   3. COPD; chronic hypoxic respiratory failure  - oxygen saturation level greater than 90% on 4L. Requires 4 Lpm supplemental O2 at baseline  -continue nebs  4. History of PE  - CTA chest neg for PE in ED  - Continue Eliquis   5. Chronic diastolic CHF  - Fluid-status difficult to determine clinically - Wt trending down since admission  - Continue home-dose Lasix, continue Coreg  - Follow daily wts  -  Follow Echo    Code Status: full Family Communication: none present Disposition Plan: in am hopefully   Consultants:  cardiology  Procedures:  echo  Antibiotics:    HPI/Subjective: Reports feeling "tired as unable to sleep last night" reports headache but denies chest pain  Objective: Vitals:   07/29/18 0844 07/29/18 1214  BP:    Pulse: 89   Resp:    Temp:    SpO2:  99%    Intake/Output Summary (Last 24 hours) at 07/29/2018 1336 Last data filed at 07/29/2018 0600 Gross per 24 hour  Intake 240 ml  Output 800 ml  Net -560 ml   Filed Weights   07/28/18 1454 07/28/18 2303  07/29/18 0522  Weight: (!) 163.3 kg (!) 157.2 kg (!) 155.5 kg    Exam:   General: obese hob up awake but drowsy in no acute distress  Cardiovascular: rrr no mgr distant HS trace LE edema  Respiratory: normal effort BS distant but clear no wheeze crackles  Abdomen: obese soft +BS no guarding or rebounding  Musculoskeletal: joints without swelling/erythema   Data Reviewed: Basic Metabolic Panel: Recent Labs  Lab 07/28/18 1619  NA 139  K 4.0  CL 102  CO2 27  GLUCOSE 110*  BUN 19  CREATININE 1.02*  CALCIUM 8.9   Liver Function Tests: No results for input(s): AST, ALT, ALKPHOS, BILITOT, PROT, ALBUMIN in the last 168 hours. No results for input(s): LIPASE, AMYLASE in the last 168 hours. No results for input(s): AMMONIA in the last 168 hours. CBC: Recent Labs  Lab 07/28/18 1619  WBC 8.5  HGB 13.6  HCT 46.8*  MCV 91.2  PLT 181   Cardiac Enzymes: Recent Labs  Lab 07/28/18 2243 07/29/18 0523  TROPONINI <0.03 <0.03   BNP (last 3 results) Recent Labs    10/24/17 1754 03/27/18 1138 07/28/18 1619  BNP 183.1* 44.8 43.5    ProBNP (last 3 results) No results for input(s): PROBNP in the last 8760 hours.  CBG: No results for input(s): GLUCAP in the last 168 hours.  No results found for this or any previous visit (from the past 240 hour(s)).   Studies: Ct Head Wo Contrast  Result Date:  07/28/2018 CLINICAL DATA:  Fall. Intermittent dizziness for the past few months. On Eliquis. EXAM: CT HEAD WITHOUT CONTRAST CT CERVICAL SPINE WITHOUT CONTRAST TECHNIQUE: Multidetector CT imaging of the head and cervical spine was performed following the standard protocol without intravenous contrast. Multiplanar CT image reconstructions of the cervical spine were also generated. COMPARISON:  MR brain dated October 28, 2017. FINDINGS: CT HEAD FINDINGS Brain: No evidence of acute infarction, hemorrhage, hydrocephalus, extra-axial collection or mass lesion/mass effect. Stable mild atrophy  and chronic microvascular ischemic changes. Vascular: No hyperdense vessel or unexpected calcification. Skull: Normal. Negative for fracture or focal lesion. Sinuses/Orbits: No acute finding. Other: None. CT CERVICAL SPINE FINDINGS Alignment: No traumatic malalignment. Skull base and vertebrae: No acute fracture. No primary bone lesion or focal pathologic process. Soft tissues and spinal canal: No prevertebral fluid or swelling. Medial deviation of the bilateral internal carotid arteries. No visible canal hematoma. Disc levels: Mild multilevel disc height loss and facet uncovertebral hypertrophy throughout the cervical spine. Upper chest: Negative. Other: None. IMPRESSION: 1. No acute intracranial abnormality. Stable mild cerebral atrophy and chronic microvascular ischemic changes. 2.  No acute cervical spine fracture.  Mild cervical spondylosis. Electronically Signed   By: Titus Dubin M.D.   On: 07/28/2018 18:35   Ct Angio Chest Pe W/cm &/or Wo Cm  Result Date: 07/28/2018 CLINICAL DATA:  Dyspnea EXAM: CT ANGIOGRAPHY CHEST WITH CONTRAST TECHNIQUE: Multidetector CT imaging of the chest was performed using the standard protocol during bolus administration of intravenous contrast. Multiplanar CT image reconstructions and MIPs were obtained to evaluate the vascular anatomy. CONTRAST:  116mL ISOVUE-370 IOPAMIDOL (ISOVUE-370) INJECTION 76% COMPARISON:  03/27/2018 FINDINGS: Cardiovascular: Cardiomegaly with aortic atherosclerosis. No acute pulmonary embolus to the segmental level. Satisfactory opacification of pulmonary arteries. Streak artifacts from the patient's adjacent arm project over the left lower lobe pulmonary arteries and descending thoracic aorta. Mediastinum/Nodes: No enlarged mediastinal, hilar, or axillary lymph nodes. Thyroid gland, trachea, and esophagus demonstrate no significant findings. Lungs/Pleura: Low lung volumes are noted bilaterally with hypoventilatory change likely accounting for areas  of ground-glass opacities noted throughout both lungs. An underlying alveolitis or pneumonitis would be difficult to entirely exclude. No effusion or pneumothorax. No dominant mass. Upper Abdomen: And nonobstructing calculi are suggested in the upper pole left kidney. Gallbladder appears physiologically distended without mural thickening. Densities near the neck of the gallbladder may be due to adjacent vasculature. Tiny calculi are not excluded. Musculoskeletal: Thoracic spondylosis Review of the MIP images confirms the above findings. IMPRESSION: 1. No acute pulmonary embolus. Stable cardiomegaly with aortic atherosclerosis. 2. Left-sided nephrolithiasis without complicating features. Electronically Signed   By: Ashley Royalty M.D.   On: 07/28/2018 18:32   Ct Cervical Spine Wo Contrast  Result Date: 07/28/2018 CLINICAL DATA:  Fall. Intermittent dizziness for the past few months. On Eliquis. EXAM: CT HEAD WITHOUT CONTRAST CT CERVICAL SPINE WITHOUT CONTRAST TECHNIQUE: Multidetector CT imaging of the head and cervical spine was performed following the standard protocol without intravenous contrast. Multiplanar CT image reconstructions of the cervical spine were also generated. COMPARISON:  MR brain dated October 28, 2017. FINDINGS: CT HEAD FINDINGS Brain: No evidence of acute infarction, hemorrhage, hydrocephalus, extra-axial collection or mass lesion/mass effect. Stable mild atrophy and chronic microvascular ischemic changes. Vascular: No hyperdense vessel or unexpected calcification. Skull: Normal. Negative for fracture or focal lesion. Sinuses/Orbits: No acute finding. Other: None. CT CERVICAL SPINE FINDINGS Alignment: No traumatic malalignment. Skull base and vertebrae: No acute fracture. No primary bone lesion or focal pathologic  process. Soft tissues and spinal canal: No prevertebral fluid or swelling. Medial deviation of the bilateral internal carotid arteries. No visible canal hematoma. Disc levels: Mild  multilevel disc height loss and facet uncovertebral hypertrophy throughout the cervical spine. Upper chest: Negative. Other: None. IMPRESSION: 1. No acute intracranial abnormality. Stable mild cerebral atrophy and chronic microvascular ischemic changes. 2.  No acute cervical spine fracture.  Mild cervical spondylosis. Electronically Signed   By: Titus Dubin M.D.   On: 07/28/2018 18:35    Scheduled Meds: . apixaban  5 mg Oral BID  . carvedilol  3.125 mg Oral BID WC  . docusate sodium  100 mg Oral BID  . famotidine  20 mg Oral Daily  . furosemide  40 mg Oral Daily  . gabapentin  400 mg Oral TID  . ipratropium-albuterol  3 mL Nebulization TID  . pantoprazole  20 mg Oral Daily   Continuous Infusions:  Principal Problem:   Dizzy Active Problems:   Essential hypertension   Chest pain   Syncope   Fall   COPD (chronic obstructive pulmonary disease) ([place])   History of pulmonary embolism    Time spent: 93 minutes    [place] NP  [facility]  If 7PM-7AM, please contact night-coverage at [email or web address], password [facility] 07/29/2018, 1:36 PM  LOS: 0 days

## 2018-07-29 NOTE — Clinical Social Work Note (Signed)
Clinical Social Work Assessment  Patient Details  Name: Janice Morrow MRN: [id number] Date of Birth: [birth year]  Date of referral:  07/29/18               Reason for consult:  Housing Concerns/Homelessness                Permission sought to share information with:  Facility Sport and exercise psychologist, Family Supports Permission granted to share information::  Yes, Verbal Permission Granted  Name::     [place]::  [facility]  Relationship::  Brother  Contact Information:  [phone]  Housing/Transportation Living arrangements for the past 2 months:  Hotel/Motel Source of Information:  Patient, Medical Team Patient Interpreter Needed:  None Criminal Activity/Legal Involvement Pertinent to Current Situation/Hospitalization:  No - Comment as needed Significant Relationships:  Siblings Lives with:  Siblings Do you feel safe going back to the place where you live?  Yes Need for family participation in patient care:  Yes (Comment)  Care giving concerns:  Housing concerns.   Social Worker assessment / plan:  CSW met with patient. No supports at bedside. CSW introduced role and inquired about housing concerns. Patient confirmed she has been staying at the [facility] with her brother but said they have to be out by 1:00 tomorrow. They were notified of this last Tuesday. Patient stated the reason they were told is because the staff is concerned her oxygen tank is dangerous after a comment the patient made. CSW called the motel and they said the reason they are being evicted is not because of the oxygen, it is because of the state the patient and her brother have left the room in. They plan to renovate it. Patient and her brother cannot return even after the room is renovated. Patient stated they have had difficulty getting a maid to clean the room for them. They have been living there since September 19, 2015 and said the motel staff expect them to clean the room  themselves. Patient reports roaches in the room, faulty wiring, and a leaking toilet. Patient stated the motel staff is lying to CSW about reason they cannot return and asked CSW multiple times to call her brother and notify him of the conversation. Left voicemail. Patient does not want a shelter list because she is afraid someone will steal her oxygen tank. Her brother is working on finding them a place to stay and will continue the search after work today. Patient stated she gets her oxygen from [employer] but that they took her oxygen tank back when she admitted to the hospital. She also said they do not want to bring her another tank right now because they do not have a permanent place to stay. She has had this oxygen tank for 18 months. Sent message to [facility] to notify. No further concerns. Patient aware plan is for discharge later today and wants to rest before she has to leave. CSW encouraged patient to contact as needed. CSW will continue to follow patient for support.  Employment status:  Retired Forensic scientist:  Medicare PT Recommendations:  Home with [place] / Referral to community resources:  [place]  Patient/Family's Response to care:  Patient refused shelter resources. Patient's brother supportive and involved in patient's care. Patient appreciated social work intervention.  Patient/Family's Understanding of and Emotional Response to Diagnosis, Current Treatment, and Prognosis:  Patient has a good understanding of the reason for admission and social work consult. Patient  appears happy with hospital care.  Emotional Assessment Appearance:  Appears stated age Attitude/Demeanor/Rapport:  Engaged, Gracious Affect (typically observed):  Accepting, Appropriate, Calm, Pleasant Orientation:  Oriented to Self, Oriented to Place, Oriented to  Time, Oriented to Situation Alcohol / Substance use:  Never Used Psych involvement (Current and /or in the community):  No  (Comment)  Discharge Needs  Concerns to be addressed:  Care Coordination Readmission within the last 30 days:  No Current discharge risk:  Homeless, Other(Oxygen needs) Barriers to Discharge:  Continued Medical Work up   Candie Chroman, LCSW 07/29/2018, 12:08 PM

## 2018-07-29 NOTE — Discharge Instructions (Signed)

## 2018-07-29 NOTE — Care Management Note (Addendum)
Case Management Note  Patient Details  Name: Janice Morrow MRN: [id number] Date of Birth: [birth year]  Subjective/Objective:   Chest Pain                Action/Plan:CM talked to patient at the bedside concerning disposition needs; she plans to return to the Extended Stay tonight and her brother will look for another place to stay tomorrow; she has home oxygen through [facility]; [employer]; they will deliver a portable oxygen tank to the hospital for transportation home; they have not picked up the Oxygen from the [facility]; she is also requesting a taxi home at discharge.  B Hadlei Stitt RN,MHA,BSN  2:30 pm- Portable oxygen has been delivered to the bedside; SW aware of transportation needs; Aneta Mins  Expected Discharge Date:  07/31/18               Expected Discharge Plan:   home/ Motel  In-House Referral:   SW  Status of Service:   In progress  Sherrilyn Rist [phone] 07/29/2018, 1:45 PM

## 2018-07-29 NOTE — Clinical Social Work Note (Signed)
CSW acknowledges homeless consult. Per documentation, patient lives at the [facility] [place]. No social work needs indicated.  CSW signing off. Consult again if any social work needs arise.  Dayton Scrape, [place]

## 2018-07-29 NOTE — Evaluation (Signed)
Physical Therapy Evaluation Patient Details Name: Janice Morrow MRN: [id number] DOB: 1945/09/27 Today's Date: 07/29/2018   History of Present Illness  Janice Morrow is a 72 y.o. female with medical history significant for obesity (BMI 60), chronic diastolic CHF, history of PE on Eliquis, COPD with chronic hypoxic respiratory failure, and chronic back pain, now presenting to the emergency department for evaluation of chest pain and syncope.  Patient reports several days of lightheadedness, particularly upon standing, as well as intermittent chest pain.  Chest pain has occurred with both rest and activity, last approximately 30 minutes, radiates towards the left arm, and associated with shortness of breath, but no nausea, vomiting, or diaphoresis.  She denies any recent fevers, chills, or increased cough.  She has been lightheaded upon standing for several days and had a syncopal episode just prior to arrival, falling and hitting her head on the bathtub.    Clinical Impression  Pt admitted with above diagnosis. Pt currently with functional limitations due to the deficits listed below (see PT Problem List). Pt was able to pivot to chair.  STates she is at her baseline.  Pt utilizes safety with transfers in this environment.  Pt limited normally and walks less and less per pt.  Asked pt if she would like Rehab in SNF and she wants to return home with [facility].  Feel that she is safe to do so.  Will follow acutely.   Pt will benefit from skilled PT to increase their independence and safety with mobility to allow discharge to the venue listed below.    Follow Up Recommendations Home health PT;Supervision/Assistance - 24 hour(Pt used [facility] PTA )    Equipment Recommendations  None recommended by PT    Recommendations for Other Services       Precautions / Restrictions Precautions Precautions: Fall Restrictions Weight Bearing Restrictions: No      Mobility  Bed Mobility Overal bed mobility:  Independent             General bed mobility comments: uses momentum and takes incr time  Transfers Overall transfer level: Needs assistance Equipment used: Straight cane Transfers: Sit to/from Stand;Stand Pivot Transfers Sit to Stand: Min assist;+2 safety/equipment;Min guard Stand pivot transfers: Min guard;+2 safety/equipment       General transfer comment: Pt was able to come to standing but maintains flexed posture wtih trunk flexed significantly.  Pt reaches for arm of chair with left UE and used cane in right UE and slowly able to take pivotal steps to chair somewhat flinging herself into chair once she had pivoted enough.  Pt states this is her baseline.  She uses cane and rollding chair in house and rolllator outdoors.   Ambulation/Gait             General Gait Details: declined today  Financial trader Rank[place] (Stroke Patients Only)       Balance Overall balance assessment: Needs assistance Sitting-balance support: No upper extremity supported;Feet supported Sitting balance-Leahy Scale: Fair     Standing balance support: Single extremity supported;During functional activity Standing balance-Leahy Scale: Poor Standing balance comment: can stand statically but relies on bil UES support.  Does not stand fully upright.                              Pertinent Vitals/Pain Pain Assessment: 0-10 Pain Score: 7  Pain Location: right headache Pain Descriptors / Indicators: Aching Pain Intervention(s): Limited activity within patient's tolerance;Monitored during session;Repositioned    Home Living Family/patient expects to be discharged to:: Private residence Living Arrangements: Other relatives Available Help at Discharge: Family;Available 24 hours/day(brother lives with pt) Type of Home: (rooming house with double beds ) Home Access: Level entry     Home Layout: One level Home Equipment: Cane - single  point;Walker - 4 wheels Additional Comments: uses office chair indoors and rollator indoors    Prior Function Level of Independence: Independent with assistive device(s)         Comments: Gets down into tub rather than showers and not open to changing this. Stays in her boarding room mostly and not very active. Brother lives with her and does all grocery shopping.      Hand Dominance   Dominant Hand: Right    Extremity/Trunk Assessment   Upper Extremity Assessment Upper Extremity Assessment: Defer to OT evaluation    Lower Extremity Assessment Lower Extremity Assessment: Generalized weakness    Cervical / Trunk Assessment Cervical / Trunk Assessment: Kyphotic  Communication   Communication: No difficulties  Cognition Arousal/Alertness: Awake/alert Behavior During Therapy: WFL for tasks assessed/performed Overall Cognitive Status: Within Functional Limits for tasks assessed                                        General Comments      Exercises     Assessment/Plan    PT Assessment Patient needs continued PT services  PT Problem List Decreased strength;Decreased activity tolerance;Decreased balance;Decreased mobility;Decreased knowledge of use of DME;Decreased safety awareness;Decreased knowledge of precautions;Other (comment);Pain;Decreased skin integrity;Obesity       PT Treatment Interventions DME instruction;Gait training;Functional mobility training;Therapeutic activities;Therapeutic exercise;Balance training;Patient/family education    PT Goals (Current goals can be found in the Care Plan section)  Acute Rehab PT Goals Patient Stated Goal: to go home PT Goal Formulation: With patient Time For Goal Achievement: 08/12/18 Potential to Achieve Goals: Good    Frequency Min 3X/week   Barriers to discharge        Co-evaluation               AM-PAC PT "6 Clicks" Mobility  Outcome Measure Help needed turning from your back to your  side while in a flat bed without using bedrails?: None Help needed moving from lying on your back to sitting on the side of a flat bed without using bedrails?: None Help needed moving to and from a bed to a chair (including a wheelchair)?: A Little Help needed standing up from a chair using your arms (e.g., wheelchair or bedside chair)?: A Little Help needed to walk in hospital room?: A Lot Help needed climbing 3-5 steps with a railing? : A Lot 6 Click Score: 18    End of Session Equipment Utilized During Treatment: Gait belt Activity Tolerance: Patient tolerated treatment well Patient left: in chair;with call bell/phone within reach;with chair alarm set Nurse Communication: Mobility status PT Visit Diagnosis: Unsteadiness on feet (R26.81);Muscle weakness (generalized) (M62.81);History of falling (Z91.81)    Time: 8938-1017 PT Time Calculation (min) (ACUTE ONLY): 19 min   Charges:   PT Evaluation $PT Eval Moderate Complexity: 1 Mod          Lavoris Sparling,PT Acute Rehabilitation Services Pager:  [phone]  Office:  [phone]    Denice Paradise 07/29/2018, 11:04  AM

## 2018-07-30 DIAGNOSIS — R55 Syncope and collapse: Secondary | ICD-10-CM | POA: Diagnosis not present

## 2018-07-30 DIAGNOSIS — R42 Dizziness and giddiness: Secondary | ICD-10-CM | POA: Diagnosis not present

## 2018-07-30 MED ORDER — NICOTINE 14 MG/24HR TD PT24: 14.0000 mg | MEDICATED_PATCH | Freq: Every day | TRANSDERMAL | 0 refills | Status: DC

## 2018-07-30 NOTE — Discharge Summary (Signed)
Physician Discharge Summary  Janice Morrow WVP:[id number] DOB: [birth year] DOA: 07/28/2018  PCP: Janith Lima, MD  Admit date: 07/28/2018 Discharge date: 07/30/2018  Time spent: 40 minutes  Recommendations for Outpatient Follow-up:  1. Follow up with PCP 1-2 weeks for evaluation of BP control, respiratory status and function 2. Recommend compliance with cpap given hx of obesity hypoventilation syndrome. 3. Follow up with Dr Stanford Breed 3-4 weeks for post hospitalization check up   Discharge Diagnoses:  Principal Problem:   Dizzy Active Problems:   Essential hypertension   Chest pain   Syncope   Fall   COPD (chronic obstructive pulmonary disease) ([place])   History of pulmonary embolism   Discharge Condition: stable Diet recommendation: heart healthy   Filed Weights   07/28/18 2303 07/29/18 0522 07/30/18 0144  Weight: (!) 157.2 kg (!) 155.5 kg (!) 158.2 kg    History of present illness:  72 year old female with a history of non-ST elevation MI, chronic combined systolic and diastolic congestive heart failure, COPD, OSA on CPAP, hypertension, history of pulmonary embolus, obesity hypoventilation syndrome and notably only mild coronary disease by cath in 2019 presented to ED 07/28/18.  She was admitted with dizziness and a fall and has had several days of lightheadedness and chest pain.  The chest pain is described as substernal radiates to left arm and is associated with shortness of breath.  Hospital Course:   1.Syncope -Presented with several days of lightheadedness, mainly on standing, with syncope just prior to arrival. CT head without acute process. EKG similar to priors, CTA negative for PE. Not orthostatic. No events on tele. Troponin neg. BNP 43. Echo with normal systolic function. evaluated by cards who opine  a fall/loss of balance with preceding dizziness suggestive of vertigo or other noncardiac process in setting of hypoxia due to non-compliance with oxygen and  cpap  2.Chest pain -resolved quickly. -EKG with no acute ischemic features, initial troponin normal, CTA chest is negative for PE or acute cardiopulmonary disease.evaluated by cards who opine minimal CAD on cath 09/2017.Echo results as noted above   3.COPD; chronic hypoxic respiratory failure -oxygen saturation level greater than 90% on 4L.Requires 4 Lpm supplemental O2 at baseline at discharge  4.History of PE -CTA chest neg for PE in ED -Continue Eliquis   5. Chronic diastolic CHF  - Wt trended down during hospitalization Continue home-dose Lasix, continue Coreg   Procedures: Echo reveals the cavity size was normal. Wall thickness was   increased in a pattern of moderate LVH. Systolic function was   normal. The estimated ejection fraction was in the range of 55%   to 60%. Wall motion was normal; there were no regional wall   motion abnormalities. Doppler parameters are consistent with abnormal left ventricular relaxation (grade 1 diastolic dysfunction)  Consultations:  Dr hilty cardiology  Discharge Exam: Vitals:   07/30/18 0825 07/30/18 0914  BP:  114/75  Pulse:  78  Resp:    Temp:    SpO2: 95% 98%    General: morbidly obese sitting up in bed. Smiling in no acute distress Cardiovascular: rrr no MGR trace LE edema Respiratory: normal effort with conversation. BS distant but clear  Discharge Instructions   Discharge Instructions    Call MD for:  difficulty breathing, headache or visual disturbances   Complete by:  As directed    Call MD for:  persistant dizziness or light-headedness   Complete by:  As directed    Call MD for:  persistant nausea and vomiting  Complete by:  As directed    Diet - low sodium heart healthy   Complete by:  As directed    Discharge instructions   Complete by:  As directed    Follow up with pcp 1-2 weeks for evaluation of BP, mobility and medications   Increase activity slowly   Complete by:  As directed       Allergies as of 07/30/2018      Reactions   Baclofen Other (See Comments)   Causes seizures   Imdur [isosorbide Dinitrate] Other (See Comments)   Severe persistent headache   Naproxen Palpitations      Medication List    STOP taking these medications   docusate sodium 100 MG capsule Commonly known as:  COLACE   famotidine 20 MG tablet Commonly known as:  PEPCID   zolpidem 5 MG tablet Commonly known as:  AMBIEN     TAKE these medications   carvedilol 3.125 MG tablet Commonly known as:  COREG TAKE 1 TABLET BY MOUTH 2 TIMES DAILY WITH A MEAL What changed:    how much to take  how to take this  when to take this  additional instructions   ELIQUIS 5 MG Tabs tablet Generic drug:  apixaban TAKE 1 TABLET BY MOUTH TWICE A DAY What changed:  how much to take   furosemide 40 MG tablet Commonly known as:  LASIX TAKE 1 TABLET (40 MG TOTAL) BY MOUTH DAILY. What changed:    when to take this  reasons to take this   gabapentin 400 MG capsule Commonly known as:  NEURONTIN Take 1 capsule (400 mg total) by mouth 3 (three) times daily. What changed:  when to take this   GOODY HEADACHE PO Take 2 packets by mouth daily as needed (headache).   ipratropium-albuterol 0.5-2.5 (3) MG/3ML Soln Commonly known as:  DUONEB Take 3 mLs by nebulization 3 (three) times daily. What changed:    when to take this  reasons to take this   OXYGEN Inhale 4 L into the lungs continuous.   pantoprazole 20 MG tablet Commonly known as:  PROTONIX TAKE 1 TABLET BY MOUTH EVERY DAY What changed:    when to take this  reasons to take this   tiZANidine 2 MG tablet Commonly known as:  ZANAFLEX TAKE 1 TABLET (2 MG TOTAL) BY MOUTH EVERY 8 (EIGHT) HOURS AS NEEDED. FOR MIGRAINE What changed:    reasons to take this  additional instructions   umeclidinium-vilanterol 62.5-25 MCG/INH Aepb Commonly known as:  ANORO ELLIPTA Inhale 1 puff into the lungs daily.            Durable  Medical Equipment  (From admission, onward)         Start     Ordered   07/29/18 1241  For home use only DME continuous positive airway pressure (CPAP)  Once    Question Answer Comment  Patient has OSA or probable OSA Yes   Is the patient currently using CPAP in the home Yes   Settings Autotitration   Signs and symptoms of probable OSA  (select all that apply) Witnessed apneas   CPAP supplies needed Mask, headgear, cushions, filters, heated tubing and water chamber      07/29/18 1241         Allergies  Allergen Reactions  . Baclofen Other (See Comments)    Causes seizures  . Imdur [Isosorbide Dinitrate] Other (See Comments)    Severe persistent headache  . Naproxen Palpitations  Follow-up Information    Janith Lima, MD.   Specialty:  Internal Medicine Contact information: [address] [phone]            The results of significant diagnostics from this hospitalization (including imaging, microbiology, ancillary and laboratory) are listed below for reference.    Significant Diagnostic Studies: Ct Head Wo Contrast  Result Date: 07/28/2018 CLINICAL DATA:  Fall. Intermittent dizziness for the past few months. On Eliquis. EXAM: CT HEAD WITHOUT CONTRAST CT CERVICAL SPINE WITHOUT CONTRAST TECHNIQUE: Multidetector CT imaging of the head and cervical spine was performed following the standard protocol without intravenous contrast. Multiplanar CT image reconstructions of the cervical spine were also generated. COMPARISON:  MR brain dated October 28, 2017. FINDINGS: CT HEAD FINDINGS Brain: No evidence of acute infarction, hemorrhage, hydrocephalus, extra-axial collection or mass lesion/mass effect. Stable mild atrophy and chronic microvascular ischemic changes. Vascular: No hyperdense vessel or unexpected calcification. Skull: Normal. Negative for fracture or focal lesion. Sinuses/Orbits: No acute finding. Other: None. CT CERVICAL SPINE  FINDINGS Alignment: No traumatic malalignment. Skull base and vertebrae: No acute fracture. No primary bone lesion or focal pathologic process. Soft tissues and spinal canal: No prevertebral fluid or swelling. Medial deviation of the bilateral internal carotid arteries. No visible canal hematoma. Disc levels: Mild multilevel disc height loss and facet uncovertebral hypertrophy throughout the cervical spine. Upper chest: Negative. Other: None. IMPRESSION: 1. No acute intracranial abnormality. Stable mild cerebral atrophy and chronic microvascular ischemic changes. 2.  No acute cervical spine fracture.  Mild cervical spondylosis. Electronically Signed   By: Titus Dubin M.D.   On: 07/28/2018 18:35   Ct Angio Chest Pe W/cm &/or Wo Cm  Result Date: 07/28/2018 CLINICAL DATA:  Dyspnea EXAM: CT ANGIOGRAPHY CHEST WITH CONTRAST TECHNIQUE: Multidetector CT imaging of the chest was performed using the standard protocol during bolus administration of intravenous contrast. Multiplanar CT image reconstructions and MIPs were obtained to evaluate the vascular anatomy. CONTRAST:  148mL ISOVUE-370 IOPAMIDOL (ISOVUE-370) INJECTION 76% COMPARISON:  03/27/2018 FINDINGS: Cardiovascular: Cardiomegaly with aortic atherosclerosis. No acute pulmonary embolus to the segmental level. Satisfactory opacification of pulmonary arteries. Streak artifacts from the patient's adjacent arm project over the left lower lobe pulmonary arteries and descending thoracic aorta. Mediastinum/Nodes: No enlarged mediastinal, hilar, or axillary lymph nodes. Thyroid gland, trachea, and esophagus demonstrate no significant findings. Lungs/Pleura: Low lung volumes are noted bilaterally with hypoventilatory change likely accounting for areas of ground-glass opacities noted throughout both lungs. An underlying alveolitis or pneumonitis would be difficult to entirely exclude. No effusion or pneumothorax. No dominant mass. Upper Abdomen: And nonobstructing calculi  are suggested in the upper pole left kidney. Gallbladder appears physiologically distended without mural thickening. Densities near the neck of the gallbladder may be due to adjacent vasculature. Tiny calculi are not excluded. Musculoskeletal: Thoracic spondylosis Review of the MIP images confirms the above findings. IMPRESSION: 1. No acute pulmonary embolus. Stable cardiomegaly with aortic atherosclerosis. 2. Left-sided nephrolithiasis without complicating features. Electronically Signed   By: Ashley Royalty M.D.   On: 07/28/2018 18:32   Ct Cervical Spine Wo Contrast  Result Date: 07/28/2018 CLINICAL DATA:  Fall. Intermittent dizziness for the past few months. On Eliquis. EXAM: CT HEAD WITHOUT CONTRAST CT CERVICAL SPINE WITHOUT CONTRAST TECHNIQUE: Multidetector CT imaging of the head and cervical spine was performed following the standard protocol without intravenous contrast. Multiplanar CT image reconstructions of the cervical spine were also generated. COMPARISON:  MR brain dated October 28, 2017. FINDINGS: CT HEAD FINDINGS Brain: No evidence of acute infarction, hemorrhage, hydrocephalus, extra-axial collection or mass lesion/mass effect. Stable mild atrophy and chronic microvascular ischemic changes. Vascular: No hyperdense vessel or unexpected calcification. Skull: Normal. Negative for fracture or focal lesion. Sinuses/Orbits: No acute finding. Other: None. CT CERVICAL SPINE FINDINGS Alignment: No traumatic malalignment. Skull base and vertebrae: No acute fracture. No primary bone lesion or focal pathologic process. Soft tissues and spinal canal: No prevertebral fluid or swelling. Medial deviation of the bilateral internal carotid arteries. No visible canal hematoma. Disc levels: Mild multilevel disc height loss and facet uncovertebral hypertrophy throughout the cervical spine. Upper chest: Negative. Other: None. IMPRESSION: 1. No acute intracranial abnormality. Stable mild cerebral atrophy and chronic  microvascular ischemic changes. 2.  No acute cervical spine fracture.  Mild cervical spondylosis. Electronically Signed   By: Titus Dubin M.D.   On: 07/28/2018 18:35    Microbiology: No results found for this or any previous visit (from the past 240 hour(s)).   Labs: Basic Metabolic Panel: Recent Labs  Lab 07/28/18 1619  NA 139  K 4.0  CL 102  CO2 27  GLUCOSE 110*  BUN 19  CREATININE 1.02*  CALCIUM 8.9   Liver Function Tests: No results for input(s): AST, ALT, ALKPHOS, BILITOT, PROT, ALBUMIN in the last 168 hours. No results for input(s): LIPASE, AMYLASE in the last 168 hours. No results for input(s): AMMONIA in the last 168 hours. CBC: Recent Labs  Lab 07/28/18 1619  WBC 8.5  HGB 13.6  HCT 46.8*  MCV 91.2  PLT 181   Cardiac Enzymes: Recent Labs  Lab 07/28/18 2243 07/29/18 0523  TROPONINI <0.03 <0.03   BNP: BNP (last 3 results) Recent Labs    10/24/17 1754 03/27/18 1138 07/28/18 1619  BNP 183.1* 44.8 43.5    ProBNP (last 3 results) No results for input(s): PROBNP in the last 8760 hours.  CBG: No results for input(s): GLUCAP in the last 168 hours.     SignedRadene Gunning NP  [facility] 07/30/2018, 10:58 AM

## 2018-07-30 NOTE — Progress Notes (Signed)
AVS reviewed, pt escorted out via wheelchair to cab with voucher.

## 2018-07-30 NOTE — Progress Notes (Signed)
Call placed to CCMD to notify of telemetry monitoring d/c.    Pt provided soap and linens for self-bath, IV d/c'd.

## 2018-07-30 NOTE — Progress Notes (Signed)
Physical Therapy Treatment Patient Details Name: Janice Morrow MRN: [id number] DOB: [birth year] Today's Date: 07/30/2018    History of Present Illness Pt is a 72 y.o. female admitted 07/28/18 with chest pain and syncope, had fall hitting head on bathtub. EKG with no acute ischemic features; chest CTA negative for PE. Worked up for syncope in the setting of hypoxia. PMH includes CHF, PE, COPD, obesity (BMI 58), chronic back pain.   PT Comments    Pt planning to discharge home this afternoon. Increased time spent discussing mobility for this, as pt was requiring minA to stand with cane last PT session. Pt will need to take taxi home; adamant she is independent with mobility and will be able to manage by herself. Pt declining OOB mobility secondary to lunch tray arriving. Reports she has no further equipment needs; has no questions or concerns. Plans to resume [facility]PT services with [facility]. If to remain admitted, will follow acutely.    Follow Up Recommendations  Home health PT;Supervision/Assistance - 24 hour(has used [facility] )     Equipment Recommendations  None recommended by PT    Recommendations for Other Services       Precautions / Restrictions Precautions Precautions: Fall Restrictions Weight Bearing Restrictions: No    Mobility  Bed Mobility Overal bed mobility: Modified Independent             General bed mobility comments: Reliant on momentum and increased time to sit EOB. Pt was agreeable to practice standing with SPC, mod indep to scoot to EOB, but then declining secondary to lunch arriving  Transfers                    Ambulation/Gait                 Stairs             Wheelchair Mobility    Modified Rankin (Stroke Patients Only)       Balance Overall balance assessment: Needs assistance Sitting-balance support: No upper extremity supported;Feet supported Sitting balance-Leahy Scale: Fair                                       Cognition Arousal/Alertness: Awake/alert   Overall Cognitive Status: Within Functional Limits for tasks assessed                                 General Comments: WFL for simple tasks. Per conversation, poor insight into deficits/current physical condition      Exercises      General Comments General comments (skin integrity, edema, etc.): Pt preparing to d/c home this afternoon. Increased time spent discussing mobility for getting home as pt will have to take a taxi and per last PT note, requiring minA to stand; pt adamant she can do all she needs to do independently. Demonstrates indep bed mobility, but declining further mobility once lunch arriving. Pt reports no questions or concerns with discharging today if PT unable to return for additional treatment      Pertinent Vitals/Pain Pain Assessment: Faces Faces Pain Scale: Hurts a little bit Pain Location: Generalized Pain Descriptors / Indicators: Discomfort Pain Intervention(s): Limited activity within patient's tolerance;Monitored during session    Home Living  Prior Function            PT Goals (current goals can now be found in the care plan section) Acute Rehab PT Goals Patient Stated Goal: to go home PT Goal Formulation: With patient Time For Goal Achievement: 08/12/18 Potential to Achieve Goals: Good Progress towards PT goals: Not progressing toward goals - comment    Frequency    Min 3X/week      PT Plan Current plan remains appropriate    Co-evaluation              AM-PAC PT "6 Clicks" Mobility   Outcome Measure  Help needed turning from your back to your side while in a flat bed without using bedrails?: None Help needed moving from lying on your back to sitting on the side of a flat bed without using bedrails?: None Help needed moving to and from a bed to a chair (including a wheelchair)?: A Little Help needed standing up from a chair  using your arms (e.g., wheelchair or bedside chair)?: A Little Help needed to walk in hospital room?: A Lot Help needed climbing 3-5 steps with a railing? : A Lot 6 Click Score: 18    End of Session   Activity Tolerance: Other (comment) Patient left: in bed;with call bell/phone within reach Nurse Communication: Mobility status PT Visit Diagnosis: Unsteadiness on feet (R26.81);Muscle weakness (generalized) (M62.81);History of falling (Z91.81)     Time: 1100-1110 PT Time Calculation (min) (ACUTE ONLY): 10 min  Charges:  $Self Care/Home Management: [place], PT, DPT Acute Rehabilitation Services  Pager [phone] Office [phone]  Derry Lory 07/30/2018, 11:59 AM

## 2018-07-30 NOTE — Progress Notes (Signed)
Patient requests [place] sandwich, states she will put her clothes on. Pt denies personal transportation, cab voucher available for pt to get home. Home O2 tank is present in room, pt denies portable tanks at home due to recent relocation.  DC paperwork complete, will review once pt is dressed.

## 2018-07-31 ENCOUNTER — Telehealth: Payer: Self-pay | Admitting: *Deleted

## 2018-07-31 ENCOUNTER — Ambulatory Visit: Payer: Medicare Other | Admitting: Internal Medicine

## 2018-07-31 NOTE — Telephone Encounter (Signed)
Transition Care Management Follow-up Telephone Call  Date discharged? 07/30/18   How have you been since you were released from the hospital? Called pt spoke w/brother he states sister is  doing alright    Do you understand why you were in the hospital? YES   Do you understand the discharge instructions? YES   Where were you discharged to? Home    Items Reviewed:  Medications reviewed: YES  Allergies reviewed: YES  Dietary changes reviewed: YES, heart healthy  Referrals reviewed: No referral needed   Functional Questionnaire:   Activities of Daily Living (ADLs):   He states he are independent in the following: bathing and hygiene, feeding, continence, grooming, toileting and dressing States she require assistance with the following: ambulation   Any transportation issues/concerns?: NO   Any patient concerns? NO   Confirmed importance and date/time of follow-up visits scheduled YES, appt 08/06/18  Provider Appointment booked with Dr. Ronnald Ramp  Confirmed with patient if condition begins to worsen call PCP or go to the ER.  Patient was given the office number and encouraged to call back with question or concerns.  : YES

## 2018-08-06 ENCOUNTER — Inpatient Hospital Stay: Payer: Medicare Other | Admitting: Internal Medicine

## 2018-08-12 ENCOUNTER — Encounter: Payer: Self-pay | Admitting: Internal Medicine

## 2018-08-12 ENCOUNTER — Ambulatory Visit (INDEPENDENT_AMBULATORY_CARE_PROVIDER_SITE_OTHER): Payer: Medicare Other | Admitting: Internal Medicine

## 2018-08-12 VITALS — BP 138/88 | HR 80 | Temp 98.1°F | Resp 20 | Ht 65.0 in | Wt 347.0 lb

## 2018-08-12 DIAGNOSIS — E118 Type 2 diabetes mellitus with unspecified complications: Secondary | ICD-10-CM

## 2018-08-12 DIAGNOSIS — I10 Essential (primary) hypertension: Secondary | ICD-10-CM

## 2018-08-12 DIAGNOSIS — J410 Simple chronic bronchitis: Secondary | ICD-10-CM | POA: Diagnosis not present

## 2018-08-12 DIAGNOSIS — M5440 Lumbago with sciatica, unspecified side: Secondary | ICD-10-CM

## 2018-08-12 DIAGNOSIS — Z23 Encounter for immunization: Secondary | ICD-10-CM | POA: Diagnosis not present

## 2018-08-12 DIAGNOSIS — G8929 Other chronic pain: Secondary | ICD-10-CM

## 2018-08-12 DIAGNOSIS — J418 Mixed simple and mucopurulent chronic bronchitis: Secondary | ICD-10-CM | POA: Diagnosis not present

## 2018-08-12 DIAGNOSIS — M5000 Cervical disc disorder with myelopathy, unspecified cervical region: Secondary | ICD-10-CM | POA: Diagnosis not present

## 2018-08-12 LAB — POCT GLYCOSYLATED HEMOGLOBIN (HGB A1C): Hemoglobin A1C: 6.1 % — AB (ref 4.0–5.6)

## 2018-08-12 MED ORDER — UMECLIDINIUM-VILANTEROL 62.5-25 MCG/INH IN AEPB: 1.0000 | INHALATION_SPRAY | Freq: Every day | RESPIRATORY_TRACT | 1 refills | Status: DC

## 2018-08-12 NOTE — Patient Instructions (Signed)

## 2018-08-12 NOTE — Progress Notes (Signed)
Subjective:  Patient ID: Janice Morrow, female    DOB: [birth year]  Age: 72 y.o. MRN: [id number]  CC: COPD and Hypertension   HPI Janice Morrow presents for a hosp. f/up - She complains of chronic pain in her back and knees.  She was previously seen by pain clinic and wants to go back to see a pain specialist.  She tells me she is allergic to Tylenol, Aleve, and Advil.  She is getting some symptom relief with gabapentin.  She was recently admitted after a fall.  She suffers from chronic shortness of breath but denies any recent episodes of cough, chest pain, or lower extremity edema.  Recommendations for Outpatient Follow-up:  1. Follow up with PCP 1-2 weeks for evaluation of BP control, respiratory status and function 2. Recommend compliance with cpap given hx of obesity hypoventilation syndrome. 3. Follow up with Dr Stanford Breed 3-4 weeks for post hospitalization check up   Discharge Diagnoses:  Principal Problem:   Dizzy Active Problems:   Essential hypertension   Chest pain   Syncope   Fall   COPD (chronic obstructive pulmonary disease) ([place])   History of pulmonary embolism  Outpatient Medications Prior to Visit  Medication Sig Dispense Refill  . carvedilol (COREG) 3.125 MG tablet TAKE 1 TABLET BY MOUTH 2 TIMES DAILY WITH A MEAL (Patient taking differently: Take 3.125 mg by mouth 2 (two) times daily with a meal. ) 180 tablet 1  . ELIQUIS 5 MG TABS tablet TAKE 1 TABLET BY MOUTH TWICE A DAY (Patient taking differently: Take 5 mg by mouth 2 (two) times daily. ) 60 tablet 3  . furosemide (LASIX) 40 MG tablet TAKE 1 TABLET (40 MG TOTAL) BY MOUTH DAILY. (Patient taking differently: Take 40 mg by mouth daily as needed for fluid or edema. ) 90 tablet 1  . gabapentin (NEURONTIN) 400 MG capsule Take 1 capsule (400 mg total) by mouth 3 (three) times daily. (Patient taking differently: Take 400 mg by mouth 2 (two) times daily. ) 270 capsule 1  . ipratropium-albuterol (DUONEB) 0.5-2.5 (3)  MG/3ML SOLN Take 3 mLs by nebulization 3 (three) times daily. (Patient taking differently: Take 3 mLs by nebulization 3 (three) times daily as needed (shortness of breath). ) 360 mL 3  . OXYGEN Inhale 4 L into the lungs continuous.     . pantoprazole (PROTONIX) 20 MG tablet TAKE 1 TABLET BY MOUTH EVERY DAY (Patient taking differently: Take 20 mg by mouth daily as needed for heartburn. ) 90 tablet 2  . tiZANidine (ZANAFLEX) 2 MG tablet TAKE 1 TABLET (2 MG TOTAL) BY MOUTH EVERY 8 (EIGHT) HOURS AS NEEDED. FOR MIGRAINE (Patient taking differently: Take 2 mg by mouth every 8 (eight) hours as needed for muscle spasms. ) 270 tablet 1  . umeclidinium-vilanterol (ANORO ELLIPTA) 62.5-25 MCG/INH AEPB Inhale 1 puff into the lungs daily. 90 each 1  . Aspirin-Acetaminophen-Caffeine (GOODY HEADACHE PO) Take 2 packets by mouth daily as needed (headache).     No facility-administered medications prior to visit.     ROS Review of Systems  Constitutional: Negative for diaphoresis, fatigue and unexpected weight change.  HENT: Negative.   Eyes: Negative.   Respiratory: Positive for shortness of breath. Negative for cough, chest tightness and wheezing.   Cardiovascular: Negative for chest pain, palpitations and leg swelling.  Gastrointestinal: Negative for abdominal pain, constipation, diarrhea, nausea and vomiting.  Endocrine: Negative.   Genitourinary: Negative.  Negative for difficulty urinating and urgency.  Musculoskeletal: Positive for  arthralgias and back pain.  Skin: Negative.  Negative for color change.  Neurological: Negative.  Negative for dizziness, weakness, light-headedness and headaches.  Hematological: Negative for adenopathy. Does not bruise/bleed easily.  Psychiatric/Behavioral: Negative.     Objective:  BP 138/88   Pulse 80   Temp 98.1 F (36.7 C) (Oral)   Resp 20   Wt (!) 347 lb (157.4 kg)   LMP 08/27/1993 (Approximate)   SpO2 93%   BMI 57.74 kg/m   BP Readings from Last 3  Encounters:  08/12/18 138/88  07/30/18 114/75  04/14/18 140/80    Wt Readings from Last 3 Encounters:  08/12/18 (!) 347 lb (157.4 kg)  07/30/18 (!) 348 lb 12.8 oz (158.2 kg)  04/14/18 (!) 342 lb (155.1 kg)    Physical Exam Vitals signs reviewed.  Constitutional:      Appearance: She is obese.     Comments: Wheelchair-bound  HENT:     Nose: Nose normal. No rhinorrhea.     Mouth/Throat:     Mouth: Mucous membranes are moist.     Pharynx: No posterior oropharyngeal erythema.  Eyes:     Conjunctiva/sclera: Conjunctivae normal.  Neck:     Musculoskeletal: Normal range of motion and neck supple.  Cardiovascular:     Rate and Rhythm: Normal rate and regular rhythm.     Heart sounds: No murmur. No gallop.   Pulmonary:     Effort: Pulmonary effort is normal.     Breath sounds: Normal breath sounds. No stridor. No wheezing, rhonchi or rales.  Abdominal:     Palpations: Abdomen is soft. There is no hepatomegaly or splenomegaly.     Tenderness: There is no abdominal tenderness.     Hernia: No hernia is present.  Musculoskeletal: Normal range of motion.        General: No swelling, tenderness or signs of injury.  Lymphadenopathy:     Cervical: No cervical adenopathy.  Skin:    General: Skin is warm and dry.  Neurological:     General: No focal deficit present.     Mental Status: She is oriented to person, place, and time. Mental status is at baseline.  Psychiatric:        Mood and Affect: Mood normal.        Behavior: Behavior normal.        Thought Content: Thought content normal.        Judgment: Judgment normal.     Lab Results  Component Value Date   WBC 8.5 07/28/2018   HGB 13.6 07/28/2018   HCT 46.8 (H) 07/28/2018   PLT 181 07/28/2018   GLUCOSE 110 (H) 07/28/2018   CHOL 179 01/30/2017   TRIG 128.0 01/30/2017   HDL 59.90 01/30/2017   LDLCALC 93 01/30/2017   ALT 13 03/29/2018   AST 17 03/29/2018   NA 139 07/28/2018   K 4.0 07/28/2018   CL 102 07/28/2018    CREATININE 1.02 (H) 07/28/2018   BUN 19 07/28/2018   CO2 27 07/28/2018   TSH 2.65 01/30/2017   INR 1.18 09/30/2017   HGBA1C 6.1 (A) 08/12/2018   MICROALBUR 9.8 (H) 05/20/2017    Ct Head Wo Contrast  Result Date: 07/28/2018 CLINICAL DATA:  Fall. Intermittent dizziness for the past few months. On Eliquis. EXAM: CT HEAD WITHOUT CONTRAST CT CERVICAL SPINE WITHOUT CONTRAST TECHNIQUE: Multidetector CT imaging of the head and cervical spine was performed following the standard protocol without intravenous contrast. Multiplanar CT image reconstructions of the cervical spine  were also generated. COMPARISON:  MR brain dated October 28, 2017. FINDINGS: CT HEAD FINDINGS Brain: No evidence of acute infarction, hemorrhage, hydrocephalus, extra-axial collection or mass lesion/mass effect. Stable mild atrophy and chronic microvascular ischemic changes. Vascular: No hyperdense vessel or unexpected calcification. Skull: Normal. Negative for fracture or focal lesion. Sinuses/Orbits: No acute finding. Other: None. CT CERVICAL SPINE FINDINGS Alignment: No traumatic malalignment. Skull base and vertebrae: No acute fracture. No primary bone lesion or focal pathologic process. Soft tissues and spinal canal: No prevertebral fluid or swelling. Medial deviation of the bilateral internal carotid arteries. No visible canal hematoma. Disc levels: Mild multilevel disc height loss and facet uncovertebral hypertrophy throughout the cervical spine. Upper chest: Negative. Other: None. IMPRESSION: 1. No acute intracranial abnormality. Stable mild cerebral atrophy and chronic microvascular ischemic changes. 2.  No acute cervical spine fracture.  Mild cervical spondylosis. Electronically Signed   By: Titus Dubin M.D.   On: 07/28/2018 18:35   Ct Angio Chest Pe W/cm &/or Wo Cm  Result Date: 07/28/2018 CLINICAL DATA:  Dyspnea EXAM: CT ANGIOGRAPHY CHEST WITH CONTRAST TECHNIQUE: Multidetector CT imaging of the chest was performed using the  standard protocol during bolus administration of intravenous contrast. Multiplanar CT image reconstructions and MIPs were obtained to evaluate the vascular anatomy. CONTRAST:  158mL ISOVUE-370 IOPAMIDOL (ISOVUE-370) INJECTION 76% COMPARISON:  03/27/2018 FINDINGS: Cardiovascular: Cardiomegaly with aortic atherosclerosis. No acute pulmonary embolus to the segmental level. Satisfactory opacification of pulmonary arteries. Streak artifacts from the patient's adjacent arm project over the left lower lobe pulmonary arteries and descending thoracic aorta. Mediastinum/Nodes: No enlarged mediastinal, hilar, or axillary lymph nodes. Thyroid gland, trachea, and esophagus demonstrate no significant findings. Lungs/Pleura: Low lung volumes are noted bilaterally with hypoventilatory change likely accounting for areas of ground-glass opacities noted throughout both lungs. An underlying alveolitis or pneumonitis would be difficult to entirely exclude. No effusion or pneumothorax. No dominant mass. Upper Abdomen: And nonobstructing calculi are suggested in the upper pole left kidney. Gallbladder appears physiologically distended without mural thickening. Densities near the neck of the gallbladder may be due to adjacent vasculature. Tiny calculi are not excluded. Musculoskeletal: Thoracic spondylosis Review of the MIP images confirms the above findings. IMPRESSION: 1. No acute pulmonary embolus. Stable cardiomegaly with aortic atherosclerosis. 2. Left-sided nephrolithiasis without complicating features. Electronically Signed   By: Ashley Royalty M.D.   On: 07/28/2018 18:32   Ct Cervical Spine Wo Contrast  Result Date: 07/28/2018 CLINICAL DATA:  Fall. Intermittent dizziness for the past few months. On Eliquis. EXAM: CT HEAD WITHOUT CONTRAST CT CERVICAL SPINE WITHOUT CONTRAST TECHNIQUE: Multidetector CT imaging of the head and cervical spine was performed following the standard protocol without intravenous contrast. Multiplanar CT  image reconstructions of the cervical spine were also generated. COMPARISON:  MR brain dated October 28, 2017. FINDINGS: CT HEAD FINDINGS Brain: No evidence of acute infarction, hemorrhage, hydrocephalus, extra-axial collection or mass lesion/mass effect. Stable mild atrophy and chronic microvascular ischemic changes. Vascular: No hyperdense vessel or unexpected calcification. Skull: Normal. Negative for fracture or focal lesion. Sinuses/Orbits: No acute finding. Other: None. CT CERVICAL SPINE FINDINGS Alignment: No traumatic malalignment. Skull base and vertebrae: No acute fracture. No primary bone lesion or focal pathologic process. Soft tissues and spinal canal: No prevertebral fluid or swelling. Medial deviation of the bilateral internal carotid arteries. No visible canal hematoma. Disc levels: Mild multilevel disc height loss and facet uncovertebral hypertrophy throughout the cervical spine. Upper chest: Negative. Other: None. IMPRESSION: 1. No acute intracranial abnormality. Stable mild  cerebral atrophy and chronic microvascular ischemic changes. 2.  No acute cervical spine fracture.  Mild cervical spondylosis. Electronically Signed   By: Titus Dubin M.D.   On: 07/28/2018 18:35    Assessment & Plan:   Shiniqua was seen today for copd and hypertension.  Diagnoses and all orders for this visit:  Essential hypertension- Her BP is well controlled.  Mixed simple and mucopurulent chronic bronchitis ([place])- She is doing well on the LABA/LAMA inhaler. -     Consult to [facility] -     Ambulatory referral to Home Health  Type 2 diabetes mellitus with complication, without long-term current use of insulin ([place])- Her blood sugars are adequately well controlled. -     POCT glycosylated hemoglobin (Hb A1C)  Chronic low back pain with sciatica, sciatica laterality unspecified, unspecified back pain laterality -     Ambulatory referral to Pain Clinic  Cervical disc disease with myelopathy -      Ambulatory referral to Pain Clinic  Need for influenza vaccination -     Flu vaccine HIGH DOSE PF (Fluzone High dose)   I have discontinued Arnaldo Natal Freiberger's Aspirin-Acetaminophen-Caffeine (GOODY HEADACHE PO). I am also having her maintain her OXYGEN, umeclidinium-vilanterol, ipratropium-albuterol, gabapentin, carvedilol, furosemide, ELIQUIS, tiZANidine, and pantoprazole.  No orders of the defined types were placed in this encounter.    Follow-up: Return in about 6 months (around 02/11/2019).  [name], MD

## 2018-08-29 ENCOUNTER — Encounter: Payer: Self-pay | Admitting: Physical Medicine & Rehabilitation

## 2018-09-16 ENCOUNTER — Other Ambulatory Visit: Payer: Self-pay | Admitting: Internal Medicine

## 2018-09-16 DIAGNOSIS — M5000 Cervical disc disorder with myelopathy, unspecified cervical region: Secondary | ICD-10-CM

## 2018-09-22 ENCOUNTER — Ambulatory Visit: Payer: Medicare Other | Admitting: Physical Medicine & Rehabilitation

## 2018-10-13 ENCOUNTER — Ambulatory Visit: Payer: Medicare Other | Admitting: Physical Medicine & Rehabilitation

## 2018-10-13 ENCOUNTER — Encounter: Payer: Medicare Other | Attending: Physical Medicine & Rehabilitation

## 2018-10-27 ENCOUNTER — Ambulatory Visit: Payer: Medicare Other | Admitting: Physical Medicine & Rehabilitation

## 2018-12-02 ENCOUNTER — Ambulatory Visit: Payer: Medicare Other | Admitting: Physical Medicine & Rehabilitation

## 2018-12-02 ENCOUNTER — Ambulatory Visit: Payer: Medicare Other

## 2018-12-19 ENCOUNTER — Telehealth: Payer: Self-pay | Admitting: Internal Medicine

## 2018-12-19 NOTE — Telephone Encounter (Signed)
Copied from [place] [id number]. Topic: Quick Communication - Rx Refill/Question >> Dec 19, 2018 10:54 AM Blase Mess A wrote: Medication: cyclobenzaprine (FLEXERIL) tablet 5 mg   [[id number]] ,gabapentin (NEURONTIN) capsule 800 mg   [[id number]  Has the patient contacted their pharmacy? Yes  (Agent: If no, request that the patient contact the pharmacy for the refill.) (Agent: If yes, when and what did the pharmacy advise?)  Preferred Pharmacy (with phone number or street name): [facility]  Agent: Please be advised that RX refills may take up to 3 business days. We ask that you follow-up with your pharmacy.

## 2018-12-20 ENCOUNTER — Other Ambulatory Visit: Payer: Self-pay | Admitting: Internal Medicine

## 2018-12-20 DIAGNOSIS — M5440 Lumbago with sciatica, unspecified side: Secondary | ICD-10-CM

## 2018-12-20 DIAGNOSIS — M170 Bilateral primary osteoarthritis of knee: Secondary | ICD-10-CM

## 2018-12-20 DIAGNOSIS — M5000 Cervical disc disorder with myelopathy, unspecified cervical region: Secondary | ICD-10-CM

## 2018-12-20 DIAGNOSIS — G8929 Other chronic pain: Secondary | ICD-10-CM

## 2018-12-20 MED ORDER — GABAPENTIN 400 MG PO CAPS: 400.0000 mg | ORAL_CAPSULE | Freq: Two times a day (BID) | ORAL | 1 refills | Status: DC

## 2018-12-20 MED ORDER — CYCLOBENZAPRINE HCL 5 MG PO TABS: 5.0000 mg | ORAL_TABLET | Freq: Three times a day (TID) | ORAL | 3 refills | Status: DC | PRN

## 2018-12-22 ENCOUNTER — Telehealth: Payer: Self-pay

## 2018-12-22 NOTE — Telephone Encounter (Signed)
Key: [id number]

## 2018-12-24 ENCOUNTER — Other Ambulatory Visit: Payer: Self-pay | Admitting: Internal Medicine

## 2018-12-24 DIAGNOSIS — M5440 Lumbago with sciatica, unspecified side: Secondary | ICD-10-CM

## 2018-12-24 DIAGNOSIS — M170 Bilateral primary osteoarthritis of knee: Secondary | ICD-10-CM

## 2018-12-24 DIAGNOSIS — G8929 Other chronic pain: Secondary | ICD-10-CM

## 2019-01-09 ENCOUNTER — Telehealth: Payer: Self-pay

## 2019-01-09 ENCOUNTER — Inpatient Hospital Stay (HOSPITAL_COMMUNITY)
Admission: EM | Admit: 2019-01-09 | Discharge: 2019-01-15 | DRG: 872 | Disposition: A | Discharge status: Home Health | Payer: Medicare Other | Attending: Family Medicine | Admitting: Family Medicine

## 2019-01-09 ENCOUNTER — Emergency Department (HOSPITAL_COMMUNITY): Payer: Medicare Other

## 2019-01-09 DIAGNOSIS — N12 Tubulo-interstitial nephritis, not specified as acute or chronic: Secondary | ICD-10-CM

## 2019-01-09 DIAGNOSIS — G4733 Obstructive sleep apnea (adult) (pediatric): Secondary | ICD-10-CM | POA: Diagnosis present

## 2019-01-09 DIAGNOSIS — N10 Acute tubulo-interstitial nephritis: Secondary | ICD-10-CM | POA: Diagnosis not present

## 2019-01-09 DIAGNOSIS — Z6841 Body Mass Index (BMI) 40.0 and over, adult: Secondary | ICD-10-CM

## 2019-01-09 DIAGNOSIS — I10 Essential (primary) hypertension: Secondary | ICD-10-CM | POA: Diagnosis present

## 2019-01-09 DIAGNOSIS — K828 Other specified diseases of gallbladder: Secondary | ICD-10-CM | POA: Diagnosis not present

## 2019-01-09 DIAGNOSIS — R110 Nausea: Secondary | ICD-10-CM | POA: Diagnosis not present

## 2019-01-09 DIAGNOSIS — Z833 Family history of diabetes mellitus: Secondary | ICD-10-CM

## 2019-01-09 DIAGNOSIS — M170 Bilateral primary osteoarthritis of knee: Secondary | ICD-10-CM | POA: Diagnosis present

## 2019-01-09 DIAGNOSIS — I2699 Other pulmonary embolism without acute cor pulmonale: Secondary | ICD-10-CM | POA: Diagnosis present

## 2019-01-09 DIAGNOSIS — I5032 Chronic diastolic (congestive) heart failure: Secondary | ICD-10-CM | POA: Diagnosis present

## 2019-01-09 DIAGNOSIS — I700 Atherosclerosis of aorta: Secondary | ICD-10-CM | POA: Diagnosis present

## 2019-01-09 DIAGNOSIS — L89151 Pressure ulcer of sacral region, stage 1: Secondary | ICD-10-CM | POA: Diagnosis present

## 2019-01-09 DIAGNOSIS — Z79899 Other long term (current) drug therapy: Secondary | ICD-10-CM

## 2019-01-09 DIAGNOSIS — R109 Unspecified abdominal pain: Secondary | ICD-10-CM | POA: Diagnosis present

## 2019-01-09 DIAGNOSIS — I130 Hypertensive heart and chronic kidney disease with heart failure and stage 1 through stage 4 chronic kidney disease, or unspecified chronic kidney disease: Secondary | ICD-10-CM | POA: Diagnosis present

## 2019-01-09 DIAGNOSIS — Z9981 Dependence on supplemental oxygen: Secondary | ICD-10-CM

## 2019-01-09 DIAGNOSIS — R197 Diarrhea, unspecified: Secondary | ICD-10-CM | POA: Diagnosis not present

## 2019-01-09 DIAGNOSIS — Z9119 Patient's noncompliance with other medical treatment and regimen: Secondary | ICD-10-CM

## 2019-01-09 DIAGNOSIS — K529 Noninfective gastroenteritis and colitis, unspecified: Secondary | ICD-10-CM | POA: Diagnosis present

## 2019-01-09 DIAGNOSIS — J9611 Chronic respiratory failure with hypoxia: Secondary | ICD-10-CM | POA: Diagnosis not present

## 2019-01-09 DIAGNOSIS — Z1159 Encounter for screening for other viral diseases: Secondary | ICD-10-CM | POA: Diagnosis not present

## 2019-01-09 DIAGNOSIS — Z20828 Contact with and (suspected) exposure to other viral communicable diseases: Secondary | ICD-10-CM | POA: Diagnosis not present

## 2019-01-09 DIAGNOSIS — J9809 Other diseases of bronchus, not elsewhere classified: Secondary | ICD-10-CM | POA: Diagnosis not present

## 2019-01-09 DIAGNOSIS — M549 Dorsalgia, unspecified: Secondary | ICD-10-CM | POA: Diagnosis present

## 2019-01-09 DIAGNOSIS — Z87891 Personal history of nicotine dependence: Secondary | ICD-10-CM

## 2019-01-09 DIAGNOSIS — L89611 Pressure ulcer of right heel, stage 1: Secondary | ICD-10-CM | POA: Diagnosis present

## 2019-01-09 DIAGNOSIS — E1122 Type 2 diabetes mellitus with diabetic chronic kidney disease: Secondary | ICD-10-CM | POA: Diagnosis present

## 2019-01-09 DIAGNOSIS — M5126 Other intervertebral disc displacement, lumbar region: Secondary | ICD-10-CM | POA: Diagnosis present

## 2019-01-09 DIAGNOSIS — Z86711 Personal history of pulmonary embolism: Secondary | ICD-10-CM

## 2019-01-09 DIAGNOSIS — Z888 Allergy status to other drugs, medicaments and biological substances status: Secondary | ICD-10-CM

## 2019-01-09 DIAGNOSIS — Z7901 Long term (current) use of anticoagulants: Secondary | ICD-10-CM

## 2019-01-09 DIAGNOSIS — A419 Sepsis, unspecified organism: Secondary | ICD-10-CM | POA: Diagnosis present

## 2019-01-09 DIAGNOSIS — R0902 Hypoxemia: Secondary | ICD-10-CM | POA: Diagnosis not present

## 2019-01-09 DIAGNOSIS — A4151 Sepsis due to Escherichia coli [E. coli]: Secondary | ICD-10-CM | POA: Diagnosis not present

## 2019-01-09 DIAGNOSIS — Z87442 Personal history of urinary calculi: Secondary | ICD-10-CM

## 2019-01-09 DIAGNOSIS — R1111 Vomiting without nausea: Secondary | ICD-10-CM | POA: Diagnosis not present

## 2019-01-09 DIAGNOSIS — L8990 Pressure ulcer of unspecified site, unspecified stage: Secondary | ICD-10-CM

## 2019-01-09 DIAGNOSIS — K219 Gastro-esophageal reflux disease without esophagitis: Secondary | ICD-10-CM | POA: Diagnosis present

## 2019-01-09 DIAGNOSIS — I272 Pulmonary hypertension, unspecified: Secondary | ICD-10-CM | POA: Diagnosis present

## 2019-01-09 DIAGNOSIS — G629 Polyneuropathy, unspecified: Secondary | ICD-10-CM | POA: Diagnosis present

## 2019-01-09 DIAGNOSIS — R079 Chest pain, unspecified: Secondary | ICD-10-CM | POA: Diagnosis not present

## 2019-01-09 DIAGNOSIS — G8929 Other chronic pain: Secondary | ICD-10-CM | POA: Diagnosis present

## 2019-01-09 DIAGNOSIS — R609 Edema, unspecified: Secondary | ICD-10-CM | POA: Diagnosis not present

## 2019-01-09 DIAGNOSIS — N183 Chronic kidney disease, stage 3 (moderate): Secondary | ICD-10-CM | POA: Diagnosis present

## 2019-01-09 DIAGNOSIS — J449 Chronic obstructive pulmonary disease, unspecified: Secondary | ICD-10-CM | POA: Diagnosis present

## 2019-01-09 DIAGNOSIS — M1990 Unspecified osteoarthritis, unspecified site: Secondary | ICD-10-CM | POA: Diagnosis present

## 2019-01-09 DIAGNOSIS — Z8249 Family history of ischemic heart disease and other diseases of the circulatory system: Secondary | ICD-10-CM

## 2019-01-09 DIAGNOSIS — E785 Hyperlipidemia, unspecified: Secondary | ICD-10-CM | POA: Diagnosis present

## 2019-01-09 LAB — COMPREHENSIVE METABOLIC PANEL
ALT: 25 U/L (ref 0–44)
AST: 27 U/L (ref 15–41)
Albumin: 3.6 g/dL (ref 3.5–5.0)
Alkaline Phosphatase: 69 U/L (ref 38–126)
Anion gap: 13 (ref 5–15)
BUN: 25 mg/dL — ABNORMAL HIGH (ref 8–23)
CO2: 22 mmol/L (ref 22–32)
Calcium: 9.4 mg/dL (ref 8.9–10.3)
Chloride: 102 mmol/L (ref 98–111)
Creatinine, Ser: 1.16 mg/dL — ABNORMAL HIGH (ref 0.44–1.00)
GFR calc Af Amer: 55 mL/min — ABNORMAL LOW (ref >60)
GFR calc non Af Amer: 48 mL/min — ABNORMAL LOW (ref >60)
Glucose, Bld: 125 mg/dL — ABNORMAL HIGH (ref 70–99)
Potassium: 4.5 mmol/L (ref 3.5–5.1)
Sodium: 137 mmol/L (ref 135–145)
Total Bilirubin: 1.1 mg/dL (ref 0.3–1.2)
Total Protein: 8 g/dL (ref 6.5–8.1)

## 2019-01-09 LAB — LACTIC ACID, PLASMA: Lactic Acid, Venous: 0.9 mmol/L (ref 0.5–1.9)

## 2019-01-09 MED ORDER — SODIUM CHLORIDE 0.9 % IV SOLN
1.0000 g | INTRAVENOUS | Status: DC
  Administered 2019-01-09 – 2019-01-11 (×3): 1 g via INTRAVENOUS
  Filled 2019-01-09 (×4): qty 10

## 2019-01-09 NOTE — ED Provider Notes (Signed)
[facility] EMERGENCY DEPARTMENT Provider Note   CSN: [id number] Arrival date & time: 01/09/19  2121    History   Chief Complaint Chief Complaint  Patient presents with  . Urinary Tract Infection    HPI ERMIE GLENDENNING is a 73 y.o. female.     The history is provided by the patient and medical records. No language interpreter was used.  Urinary Tract Infection   KABAO LEITE is a 73 y.o. female who presents to the Emergency Department complaining of abdominal pain. She presents to the emergency department by EMS complaining of right sided abdominal pain, urinary frequency as well as right hip/back pain for the last three weeks. She complains of progressive weakness and difficulty with ambulation. Symptoms began three weeks ago but significantly worsened over the last 24 hours. She complains of chronic diarrhea as well. She has shortness of breath at baseline, unchanged from her baseline. She is normally on 4 L nasal cannula oxygen. No known COVID contacts. She currently lives with her brother. No reports of fever at home.  Sxs are severe, constant, worsening.   Past Medical History:  Diagnosis Date  . Acute diastolic CHF (congestive heart failure) ([place]) 08/24/2015  . Cervicalgia   . Chronic back pain   . COPD (chronic obstructive pulmonary disease) ([facility])    on 4L home O2  . Essential hypertension 08/24/2015  . GERD (gastroesophageal reflux disease)   . Headache(784.0)   . Heart murmur   . History of kidney stones   . Hyperlipidemia   . Migraine without aura, with intractable migraine, so stated, without mention of status migrainosus   . OSA on CPAP    she no longer wears CPAP  . Osteoarthritis   . Pulmonary embolism ([place])   . PVD (peripheral vascular disease) ([place])   . Seizures ([place]) 07/2014, 10/2014   due to baclofen  . Shortness of breath dyspnea   . Trigeminal neuralgia     Patient Active Problem List   Diagnosis Date Noted  . Mild renal  insufficiency 07/28/2018  . Chronic respiratory failure with hypoxia ([place])   . Insomnia secondary to chronic pain 02/03/2018  . Cervical disc disease with myelopathy 01/28/2018  . Primary osteoarthritis of both knees 10/14/2017  . Type 2 diabetes mellitus with complication, without long-term current use of insulin ([place]) 05/20/2017  . Kidney stone on left side   . COPD (chronic obstructive pulmonary disease) ([place]) 08/24/2015  . Essential hypertension 08/24/2015  . Seizures ([place]) 08/24/2015  . Obesity hypoventilation syndrome ([place]) 08/24/2015  . Migraine without aura and with status migrainosus, not intractable 07/12/2015  . OSA on CPAP 07/12/2015  . Left ventricular diastolic dysfunction, NYHA class 1 04/11/2015  . Visit for screening mammogram 03/08/2015  . Trigeminal neuralgia of right side of face 11/23/2014  . Chronic back pain 11/26/2013  . Hyperlipidemia with target LDL less than 130 09/01/2010  . Severe obesity (BMI >= 40) ([place]) 09/01/2010  . GERD 09/01/2010    Past Surgical History:  Procedure Laterality Date  . LEFT HEART CATH AND CORONARY ANGIOGRAPHY N/A 10/01/2017   Procedure: LEFT HEART CATH AND CORONARY ANGIOGRAPHY;  Surgeon: Leonie Man, MD;  Location: [facility];  Service: Cardiovascular;  Laterality: N/A;  . MULTIPLE TOOTH EXTRACTIONS     "they took out part of my teeth; I took out the rest when they got loose; was suppose to get dentures; never did"  . NO PAST SURGERIES    .  URETEROSCOPY WITH HOLMIUM LASER LITHOTRIPSY Left 07/09/2017   Procedure: URETEROSCOPY WITH HOLMIUM LASER LITHOTRIPSY/ STENT;  Surgeon: Festus Aloe, MD;  Location: [facility];  Service: Urology;  Laterality: Left;  . URETEROSCOPY WITH HOLMIUM LASER LITHOTRIPSY Left 07/23/2017   Procedure: URETEROSCOPY WITH HOLMIUM LASER LITHOTRIPSY /STENT;  Surgeon: Festus Aloe, MD;  Location: [facility];  Service: Urology;  Laterality: Left;  NEEDS 60 MINUTES FOR PROCEDURE     OB History     Gravida  4   Para  1   Term  1   Preterm      AB  3   Living  0     SAB  3   TAB      Ectopic      Multiple      Live Births           Obstetric Comments  Dec in [place] Medications    Prior to Admission medications   Medication Sig Start Date End Date Taking? Authorizing Provider  carvedilol (COREG) 3.125 MG tablet TAKE 1 TABLET BY MOUTH 2 TIMES DAILY WITH A MEAL Patient taking differently: Take 3.125 mg by mouth 2 (two) times daily with a meal.  04/14/18   Janith Lima, MD  ELIQUIS 5 MG TABS tablet TAKE 1 TABLET BY MOUTH TWICE A DAY Patient taking differently: Take 5 mg by mouth 2 (two) times daily.  04/28/18   Janith Lima, MD  furosemide (LASIX) 40 MG tablet TAKE 1 TABLET (40 MG TOTAL) BY MOUTH DAILY. Patient taking differently: Take 40 mg by mouth daily as needed for fluid or edema.  04/20/18   Janith Lima, MD  gabapentin (NEURONTIN) 400 MG capsule Take 1 capsule (400 mg total) by mouth 2 (two) times daily. 12/20/18   Janith Lima, MD  ipratropium-albuterol (DUONEB) 0.5-2.5 (3) MG/3ML SOLN Take 3 mLs by nebulization 3 (three) times daily. Patient taking differently: Take 3 mLs by nebulization 3 (three) times daily as needed (shortness of breath).  04/14/18   Janith Lima, MD  OXYGEN Inhale 4 L into the lungs continuous.     [provider]  pantoprazole (PROTONIX) 20 MG tablet TAKE 1 TABLET BY MOUTH EVERY DAY Patient taking differently: Take 20 mg by mouth daily as needed for heartburn.  07/21/18   Lendon Colonel, NP  tiZANidine (ZANAFLEX) 2 MG tablet Take 1 tablet (2 mg total) by mouth every 8 (eight) hours as needed for muscle spasms. For migraine 12/24/18   Janith Lima, MD  umeclidinium-vilanterol (ANORO ELLIPTA) 62.5-25 MCG/INH AEPB Inhale 1 puff into the lungs daily. 08/12/18   Janith Lima, MD    Family History Family History  Problem Relation Age of Onset  . Heart attack Father   . Diabetes Mother   .  Dementia Mother   . Diabetes Brother   . Heart disease Unknown   . Diabetes Unknown     Social History Social History   Tobacco Use  . Smoking status: Former Smoker    Packs/day: 0.30    Years: 30.00    Pack years: 9.00    Types: Cigarettes    Last attempt to quit: 08/22/2010    Years since quitting: 8.3  . Smokeless tobacco: Never Used  Substance Use Topics  . Alcohol use: No    Alcohol/week: 0.0 standard drinks  . Drug use: No     Allergies   Baclofen; Imdur [isosorbide  dinitrate]; and Naproxen   Review of Systems Review of Systems  All other systems reviewed and are negative.    Physical Exam Updated Vital Signs BP (!) 112/52 (BP Location: Right Wrist)   Pulse (!) 109   Temp (!) 101.4 F (38.6 C) (Oral)   Resp 14   LMP 08/27/1993 (Approximate)   SpO2 98%   Physical Exam Vitals signs and nursing note reviewed.  Constitutional:      Appearance: She is well-developed.  HENT:     Head: Normocephalic and atraumatic.  Cardiovascular:     Rate and Rhythm: Regular rhythm.     Comments: tachcyardic Pulmonary:     Effort: Pulmonary effort is normal. No respiratory distress.  Abdominal:     Palpations: Abdomen is soft.     Tenderness: There is no guarding or rebound.     Comments: Moderate generalized abdominal tenderness, greatest over the right hemiabdomen.   Musculoskeletal:        General: No tenderness.     Comments: 2+ DP pulses bilaterally.  Faint ecchymosis to right foot.    Skin:    General: Skin is warm and dry.  Neurological:     Mental Status: She is alert and oriented to person, place, and time.     Comments: Generalized weakness.  Wiggles toes in feet bilaterally.    Psychiatric:        Behavior: Behavior normal.      ED Treatments / Results  Labs (all labs ordered are listed, but only abnormal results are displayed) Labs Reviewed  CULTURE, BLOOD (ROUTINE X 2)  CULTURE, BLOOD (ROUTINE X 2)  URINE CULTURE  SARS CORONAVIRUS 2  (HOSPITAL ORDER, [facility])  LACTIC ACID, PLASMA  LACTIC ACID, PLASMA  COMPREHENSIVE METABOLIC PANEL  CBC WITH DIFFERENTIAL/PLATELET  URINALYSIS, ROUTINE W REFLEX MICROSCOPIC    EKG None  Radiology No results found.  Procedures Procedures (including critical care time)  Medications Ordered in ED Medications  cefTRIAXone (ROCEPHIN) 1 g in sodium chloride 0.9 % 100 mL IVPB (has no administration in time range)     Initial Impression / Assessment and Plan / ED Course  I have reviewed the triage vital signs and the nursing notes.  Pertinent labs & imaging results that were available during my care of the patient were reviewed by me and considered in my medical decision making (see chart for details).        Pt here for evaluation of urinary frequency, right sided abdominal/flank pain for the last three weeks. She does have generalized abdominal tenderness on examination without peritoneal findings. Initial concern for urinary tract infection and she was treated with antibiotics. Plan to obtain CT abdomen pelvis given her abdominal tenderness. Fluid resuscitation was withheld given her normal blood pressure, normal lactate and history of CHF. Patient care transferred pending CT, UA and CBC.  Final Clinical Impressions(s) / ED Diagnoses   Final diagnoses:  None    ED Discharge Orders    None       Quintella Reichert, MD 01/09/19 2358

## 2019-01-09 NOTE — Telephone Encounter (Signed)
Copied from [place] [id number]. Topic: General - Other >> Jan 09, 2019  1:38 PM Shelita Stare wrote: Jeneen Rinks with [facility] would like pt to be scheduled for an appt to follow up on oxgyen. He will also reach out to the pt >> Jan 09, 2019  2:11 PM Lonia Skinner [place] A wrote: LVM for patient to call and set up an appointment.  Would you like this to be in office to check the O2? >> Jan 09, 2019  3:27 PM Para Skeans A wrote: Patient call back and spoke with her. She states she needs a refill on O2. Jonelle Sidle is going to FU with [employer] on this.   I have talked with james cain/[facility] services---medicare is requiring documentation to prove the need to continue o2---it has to be documented in the patients chart by a doctor within 90 days of original order---original order was 04/29/18 signed by dr jones---patient has discharge summary from hospital stating that she needs to stay on 4L for 90% saturation---james/[facility] will try submitting that paperwork to medicare to get approval for oxygen today----patient advised---james/[facility] to follow up with patient based on what medicare says---if medicare will not accept the current documentation we have, patient will need appt with pcp for that reason, and a new order will need to be given to [employer] in order to continue o2 at home

## 2019-01-09 NOTE — ED Notes (Signed)
Attempted x2 to get IV

## 2019-01-09 NOTE — ED Notes (Addendum)
[facility] reports patient is complaining of pain with urination, frequent urination, foul odor of urine, increased weakness, and diarrhea. She normally walks with a walker but is now too weak to even clean herself. She reports lower back pain and leg pain. Her brother reports she has not been changed since 01/05/2019. [facility] reports that they will follow up with [facility]. She denies fever or taking antibiotics recently. She is normally on 4L of oxygen for her CHF. Her last EMS vitals were 98.1, 156 CBG, 140/98 hr of 106 and respirations of 18.

## 2019-01-10 ENCOUNTER — Emergency Department (HOSPITAL_COMMUNITY): Payer: Medicare Other

## 2019-01-10 ENCOUNTER — Inpatient Hospital Stay (HOSPITAL_COMMUNITY): Payer: Medicare Other

## 2019-01-10 DIAGNOSIS — J9611 Chronic respiratory failure with hypoxia: Secondary | ICD-10-CM | POA: Diagnosis present

## 2019-01-10 DIAGNOSIS — J9811 Atelectasis: Secondary | ICD-10-CM | POA: Diagnosis not present

## 2019-01-10 DIAGNOSIS — W06XXXA Fall from bed, initial encounter: Secondary | ICD-10-CM | POA: Diagnosis not present

## 2019-01-10 DIAGNOSIS — J9809 Other diseases of bronchus, not elsewhere classified: Secondary | ICD-10-CM | POA: Diagnosis not present

## 2019-01-10 DIAGNOSIS — Z20828 Contact with and (suspected) exposure to other viral communicable diseases: Secondary | ICD-10-CM | POA: Diagnosis not present

## 2019-01-10 DIAGNOSIS — B882 Other arthropod infestations: Secondary | ICD-10-CM | POA: Diagnosis not present

## 2019-01-10 DIAGNOSIS — Z9119 Patient's noncompliance with other medical treatment and regimen: Secondary | ICD-10-CM | POA: Diagnosis not present

## 2019-01-10 DIAGNOSIS — I5032 Chronic diastolic (congestive) heart failure: Secondary | ICD-10-CM

## 2019-01-10 DIAGNOSIS — Z743 Need for continuous supervision: Secondary | ICD-10-CM | POA: Diagnosis not present

## 2019-01-10 DIAGNOSIS — Z1159 Encounter for screening for other viral diseases: Secondary | ICD-10-CM | POA: Diagnosis not present

## 2019-01-10 DIAGNOSIS — M48061 Spinal stenosis, lumbar region without neurogenic claudication: Secondary | ICD-10-CM | POA: Diagnosis not present

## 2019-01-10 DIAGNOSIS — Z87442 Personal history of urinary calculi: Secondary | ICD-10-CM | POA: Diagnosis not present

## 2019-01-10 DIAGNOSIS — R2689 Other abnormalities of gait and mobility: Secondary | ICD-10-CM | POA: Diagnosis not present

## 2019-01-10 DIAGNOSIS — I272 Pulmonary hypertension, unspecified: Secondary | ICD-10-CM | POA: Diagnosis present

## 2019-01-10 DIAGNOSIS — R279 Unspecified lack of coordination: Secondary | ICD-10-CM | POA: Diagnosis not present

## 2019-01-10 DIAGNOSIS — Z9989 Dependence on other enabling machines and devices: Secondary | ICD-10-CM

## 2019-01-10 DIAGNOSIS — L89151 Pressure ulcer of sacral region, stage 1: Secondary | ICD-10-CM | POA: Diagnosis present

## 2019-01-10 DIAGNOSIS — N10 Acute tubulo-interstitial nephritis: Secondary | ICD-10-CM

## 2019-01-10 DIAGNOSIS — R262 Difficulty in walking, not elsewhere classified: Secondary | ICD-10-CM | POA: Diagnosis not present

## 2019-01-10 DIAGNOSIS — J418 Mixed simple and mucopurulent chronic bronchitis: Secondary | ICD-10-CM | POA: Diagnosis not present

## 2019-01-10 DIAGNOSIS — Y939 Activity, unspecified: Secondary | ICD-10-CM | POA: Diagnosis not present

## 2019-01-10 DIAGNOSIS — B888 Other specified infestations: Secondary | ICD-10-CM | POA: Diagnosis not present

## 2019-01-10 DIAGNOSIS — R079 Chest pain, unspecified: Secondary | ICD-10-CM | POA: Diagnosis not present

## 2019-01-10 DIAGNOSIS — J449 Chronic obstructive pulmonary disease, unspecified: Secondary | ICD-10-CM | POA: Diagnosis present

## 2019-01-10 DIAGNOSIS — A4151 Sepsis due to Escherichia coli [E. coli]: Secondary | ICD-10-CM | POA: Diagnosis present

## 2019-01-10 DIAGNOSIS — S8991XA Unspecified injury of right lower leg, initial encounter: Secondary | ICD-10-CM | POA: Diagnosis not present

## 2019-01-10 DIAGNOSIS — Z7901 Long term (current) use of anticoagulants: Secondary | ICD-10-CM | POA: Diagnosis not present

## 2019-01-10 DIAGNOSIS — Z86711 Personal history of pulmonary embolism: Secondary | ICD-10-CM | POA: Diagnosis not present

## 2019-01-10 DIAGNOSIS — Y999 Unspecified external cause status: Secondary | ICD-10-CM | POA: Diagnosis not present

## 2019-01-10 DIAGNOSIS — I10 Essential (primary) hypertension: Secondary | ICD-10-CM | POA: Diagnosis not present

## 2019-01-10 DIAGNOSIS — E785 Hyperlipidemia, unspecified: Secondary | ICD-10-CM | POA: Diagnosis not present

## 2019-01-10 DIAGNOSIS — M25561 Pain in right knee: Secondary | ICD-10-CM | POA: Diagnosis not present

## 2019-01-10 DIAGNOSIS — G8929 Other chronic pain: Secondary | ICD-10-CM | POA: Diagnosis present

## 2019-01-10 DIAGNOSIS — L89611 Pressure ulcer of right heel, stage 1: Secondary | ICD-10-CM | POA: Diagnosis present

## 2019-01-10 DIAGNOSIS — S8001XA Contusion of right knee, initial encounter: Secondary | ICD-10-CM | POA: Diagnosis not present

## 2019-01-10 DIAGNOSIS — Z6841 Body Mass Index (BMI) 40.0 and over, adult: Secondary | ICD-10-CM | POA: Diagnosis not present

## 2019-01-10 DIAGNOSIS — I130 Hypertensive heart and chronic kidney disease with heart failure and stage 1 through stage 4 chronic kidney disease, or unspecified chronic kidney disease: Secondary | ICD-10-CM | POA: Diagnosis present

## 2019-01-10 DIAGNOSIS — A419 Sepsis, unspecified organism: Secondary | ICD-10-CM | POA: Diagnosis not present

## 2019-01-10 DIAGNOSIS — K219 Gastro-esophageal reflux disease without esophagitis: Secondary | ICD-10-CM

## 2019-01-10 DIAGNOSIS — R531 Weakness: Secondary | ICD-10-CM | POA: Diagnosis not present

## 2019-01-10 DIAGNOSIS — N183 Chronic kidney disease, stage 3 (moderate): Secondary | ICD-10-CM | POA: Diagnosis present

## 2019-01-10 DIAGNOSIS — M1990 Unspecified osteoarthritis, unspecified site: Secondary | ICD-10-CM | POA: Diagnosis present

## 2019-01-10 DIAGNOSIS — I2699 Other pulmonary embolism without acute cor pulmonale: Secondary | ICD-10-CM | POA: Diagnosis present

## 2019-01-10 DIAGNOSIS — G4733 Obstructive sleep apnea (adult) (pediatric): Secondary | ICD-10-CM | POA: Diagnosis present

## 2019-01-10 DIAGNOSIS — R109 Unspecified abdominal pain: Secondary | ICD-10-CM | POA: Diagnosis present

## 2019-01-10 DIAGNOSIS — M5489 Other dorsalgia: Secondary | ICD-10-CM | POA: Diagnosis not present

## 2019-01-10 DIAGNOSIS — M5126 Other intervertebral disc displacement, lumbar region: Secondary | ICD-10-CM | POA: Diagnosis present

## 2019-01-10 DIAGNOSIS — K828 Other specified diseases of gallbladder: Secondary | ICD-10-CM | POA: Diagnosis not present

## 2019-01-10 DIAGNOSIS — Y9259 Other trade areas as the place of occurrence of the external cause: Secondary | ICD-10-CM | POA: Diagnosis not present

## 2019-01-10 LAB — URINALYSIS, ROUTINE W REFLEX MICROSCOPIC
Bilirubin Urine: NEGATIVE
Glucose, UA: NEGATIVE mg/dL
Ketones, ur: NEGATIVE mg/dL
Nitrite: NEGATIVE
Protein, ur: NEGATIVE mg/dL
Specific Gravity, Urine: 1.042 — ABNORMAL HIGH (ref 1.005–1.030)
pH: 5 (ref 5.0–8.0)

## 2019-01-10 LAB — BASIC METABOLIC PANEL
Anion gap: 11 (ref 5–15)
BUN: 21 mg/dL (ref 8–23)
CO2: 26 mmol/L (ref 22–32)
Calcium: 9.2 mg/dL (ref 8.9–10.3)
Chloride: 101 mmol/L (ref 98–111)
Creatinine, Ser: 1.06 mg/dL — ABNORMAL HIGH (ref 0.44–1.00)
GFR calc Af Amer: >60 mL/min (ref >60)
GFR calc non Af Amer: 53 mL/min — ABNORMAL LOW (ref >60)
Glucose, Bld: 135 mg/dL — ABNORMAL HIGH (ref 70–99)
Potassium: 4.1 mmol/L (ref 3.5–5.1)
Sodium: 138 mmol/L (ref 135–145)

## 2019-01-10 LAB — PROTIME-INR
INR: 1.1 (ref 0.8–1.2)
Prothrombin Time: 14.3 seconds (ref 11.4–15.2)

## 2019-01-10 LAB — CBC WITH DIFFERENTIAL/PLATELET
Abs Immature Granulocytes: 0.07 10*3/uL (ref 0.00–0.07)
Basophils Absolute: 0 10*3/uL (ref 0.0–0.1)
Basophils Relative: 0 %
Eosinophils Absolute: 0 10*3/uL (ref 0.0–0.5)
Eosinophils Relative: 0 %
HCT: 46.7 % — ABNORMAL HIGH (ref 36.0–46.0)
Hemoglobin: 13.9 g/dL (ref 12.0–15.0)
Immature Granulocytes: 1 %
Lymphocytes Relative: 11 %
Lymphs Abs: 1.5 10*3/uL (ref 0.7–4.0)
MCH: 27 pg (ref 26.0–34.0)
MCHC: 29.8 g/dL — ABNORMAL LOW (ref 30.0–36.0)
MCV: 90.9 fL (ref 80.0–100.0)
Monocytes Absolute: 1.5 10*3/uL — ABNORMAL HIGH (ref 0.1–1.0)
Monocytes Relative: 11 %
Neutro Abs: 10.7 10*3/uL — ABNORMAL HIGH (ref 1.7–7.7)
Neutrophils Relative %: 77 %
Platelets: 145 10*3/uL — ABNORMAL LOW (ref 150–400)
RBC: 5.14 MIL/uL — ABNORMAL HIGH (ref 3.87–5.11)
RDW: 15.4 % (ref 11.5–15.5)
WBC: 13.9 10*3/uL — ABNORMAL HIGH (ref 4.0–10.5)
nRBC: 0 % (ref 0.0–0.2)

## 2019-01-10 LAB — TROPONIN I: Troponin I: <0.03 ng/mL (ref <0.03)

## 2019-01-10 LAB — CBC
HCT: 44.8 % (ref 36.0–46.0)
Hemoglobin: 13.2 g/dL (ref 12.0–15.0)
MCH: 26.8 pg (ref 26.0–34.0)
MCHC: 29.5 g/dL — ABNORMAL LOW (ref 30.0–36.0)
MCV: 91.1 fL (ref 80.0–100.0)
Platelets: 149 10*3/uL — ABNORMAL LOW (ref 150–400)
RBC: 4.92 MIL/uL (ref 3.87–5.11)
RDW: 15.6 % — ABNORMAL HIGH (ref 11.5–15.5)
WBC: 13 10*3/uL — ABNORMAL HIGH (ref 4.0–10.5)
nRBC: 0 % (ref 0.0–0.2)

## 2019-01-10 LAB — SARS CORONAVIRUS 2 BY RT PCR (HOSPITAL ORDER, PERFORMED IN ~~LOC~~ HOSPITAL LAB): SARS Coronavirus 2: NEGATIVE

## 2019-01-10 LAB — APTT: aPTT: 26 seconds (ref 24–36)

## 2019-01-10 LAB — LACTIC ACID, PLASMA
Lactic Acid, Venous: 1.7 mmol/L (ref 0.5–1.9)
Lactic Acid, Venous: 0.7 mmol/L (ref 0.5–1.9)

## 2019-01-10 LAB — BRAIN NATRIURETIC PEPTIDE: B Natriuretic Peptide: 97.5 pg/mL (ref 0.0–100.0)

## 2019-01-10 LAB — PROCALCITONIN: Procalcitonin: <0.1 ng/mL

## 2019-01-10 LAB — LIPASE, BLOOD: Lipase: 53 U/L — ABNORMAL HIGH (ref 11–51)

## 2019-01-10 MED ORDER — ACETAMINOPHEN 500 MG PO TABS
1000.0000 mg | ORAL_TABLET | Freq: Once | ORAL | Status: AC
  Administered 2019-01-10: 01:00:00 1000 mg via ORAL
  Filled 2019-01-10: qty 2

## 2019-01-10 MED ORDER — MORPHINE SULFATE (PF) 2 MG/ML IV SOLN
2.0000 mg | INTRAVENOUS | Status: DC | PRN
  Administered 2019-01-10 – 2019-01-14 (×5): 2 mg via INTRAVENOUS
  Filled 2019-01-10 (×5): qty 1

## 2019-01-10 MED ORDER — ORAL CARE MOUTH RINSE
15.0000 mL | Freq: Two times a day (BID) | OROMUCOSAL | Status: DC
  Administered 2019-01-10 – 2019-01-15 (×11): 15 mL via OROMUCOSAL

## 2019-01-10 MED ORDER — HYDRALAZINE HCL 20 MG/ML IJ SOLN: 5.0000 mg | INTRAMUSCULAR | Status: DC | PRN

## 2019-01-10 MED ORDER — LEVALBUTEROL HCL 0.63 MG/3ML IN NEBU: 0.6300 mg | INHALATION_SOLUTION | Freq: Four times a day (QID) | RESPIRATORY_TRACT | Status: DC | PRN

## 2019-01-10 MED ORDER — OXYCODONE-ACETAMINOPHEN 5-325 MG PO TABS
1.0000 | ORAL_TABLET | ORAL | Status: DC | PRN
  Administered 2019-01-10 – 2019-01-15 (×5): 1 via ORAL
  Filled 2019-01-10 (×5): qty 1

## 2019-01-10 MED ORDER — ONDANSETRON HCL 4 MG PO TABS: 4.0000 mg | ORAL_TABLET | Freq: Four times a day (QID) | ORAL | Status: DC | PRN

## 2019-01-10 MED ORDER — HYDROMORPHONE HCL 1 MG/ML IJ SOLN
0.5000 mg | Freq: Once | INTRAMUSCULAR | Status: AC
  Administered 2019-01-10: 04:00:00 0.5 mg via INTRAVENOUS
  Filled 2019-01-10: qty 1

## 2019-01-10 MED ORDER — ACETAMINOPHEN 325 MG PO TABS
650.0000 mg | ORAL_TABLET | Freq: Four times a day (QID) | ORAL | Status: DC | PRN
  Filled 2019-01-10: qty 2

## 2019-01-10 MED ORDER — CARVEDILOL 3.125 MG PO TABS
3.1250 mg | ORAL_TABLET | Freq: Two times a day (BID) | ORAL | Status: DC
  Administered 2019-01-10 – 2019-01-13 (×8): 3.125 mg via ORAL
  Filled 2019-01-10 (×8): qty 1

## 2019-01-10 MED ORDER — FENTANYL CITRATE (PF) 100 MCG/2ML IJ SOLN
75.0000 ug | Freq: Once | INTRAMUSCULAR | Status: AC
  Administered 2019-01-10: 01:00:00 75 ug via INTRAVENOUS
  Filled 2019-01-10: qty 2

## 2019-01-10 MED ORDER — ACETAMINOPHEN 650 MG RE SUPP: 650.0000 mg | Freq: Four times a day (QID) | RECTAL | Status: DC | PRN

## 2019-01-10 MED ORDER — PANTOPRAZOLE SODIUM 20 MG PO TBEC: 20.0000 mg | DELAYED_RELEASE_TABLET | Freq: Every day | ORAL | Status: DC

## 2019-01-10 MED ORDER — GABAPENTIN 400 MG PO CAPS: 400.0000 mg | ORAL_CAPSULE | Freq: Two times a day (BID) | ORAL | Status: DC

## 2019-01-10 MED ORDER — UMECLIDINIUM-VILANTEROL 62.5-25 MCG/INH IN AEPB: 1.0000 | INHALATION_SPRAY | Freq: Every day | RESPIRATORY_TRACT | Status: DC

## 2019-01-10 MED ORDER — GADOBUTROL 1 MMOL/ML IV SOLN
10.0000 mL | Freq: Once | INTRAVENOUS | Status: AC | PRN
  Administered 2019-01-10: 06:00:00 10 mL via INTRAVENOUS

## 2019-01-10 MED ORDER — FUROSEMIDE 10 MG/ML IJ SOLN
20.0000 mg | Freq: Once | INTRAMUSCULAR | Status: AC
  Administered 2019-01-10: 20 mg via INTRAVENOUS
  Filled 2019-01-10: qty 2

## 2019-01-10 MED ORDER — IOHEXOL 300 MG/ML  SOLN
125.0000 mL | Freq: Once | INTRAMUSCULAR | Status: AC | PRN
  Administered 2019-01-10: 125 mL via INTRAVENOUS

## 2019-01-10 MED ORDER — DIAZEPAM 5 MG/ML IJ SOLN
2.5000 mg | Freq: Once | INTRAMUSCULAR | Status: AC
  Administered 2019-01-10: 05:00:00 2.5 mg via INTRAVENOUS
  Filled 2019-01-10: qty 2

## 2019-01-10 MED ORDER — IPRATROPIUM-ALBUTEROL 0.5-2.5 (3) MG/3ML IN SOLN: 3.0000 mL | Freq: Three times a day (TID) | RESPIRATORY_TRACT | Status: DC

## 2019-01-10 MED ORDER — APIXABAN 5 MG PO TABS
5.0000 mg | ORAL_TABLET | Freq: Two times a day (BID) | ORAL | Status: DC
  Administered 2019-01-10 – 2019-01-15 (×11): 5 mg via ORAL
  Filled 2019-01-10 (×12): qty 1

## 2019-01-10 MED ORDER — ONDANSETRON HCL 4 MG/2ML IJ SOLN
4.0000 mg | Freq: Four times a day (QID) | INTRAMUSCULAR | Status: DC | PRN
  Administered 2019-01-10: 4 mg via INTRAVENOUS
  Filled 2019-01-10 (×2): qty 2

## 2019-01-10 NOTE — Progress Notes (Signed)
Notified MD Nevada Crane that pt was c/o chest pain 7/10 aching. MD called back and stated to give pt morphine as ordered PRN and for a 12 lead EKG.   Paulla Fore, RN

## 2019-01-10 NOTE — H&P (Signed)
History and Physical    Janice Morrow FTD:[id number] DOB: 1945/12/09 DOA: 01/09/2019  Referring MD/NP/PA:   PCP: Janith Lima, MD   Patient coming from:  The patient is coming from home.  At baseline, pt is independent for most of ADL.        Chief Complaint: Abdominal pain, dysuria, fever, chills, back pain  HPI: Janice Morrow is a 73 y.o. female with medical history significant of hypertension, hyperlipidemia, COPD on 4 L oxygen, dCHF, GERD, migraine, OSA on CPAP, morbid obesity, kidney stone, CKD 3, who presents with abdominal pain, back pain, fever, chills, dysuria.  Patient is a poor historian, history is limited. Patient states that she has been abdominal pain for almost 3 weeks, which has worsened today.  The abdominal pain is mainly located in the right lower quadrant, also involves whole abdomen.  The pain is constant, sharp, severe, nonradiating.  She also has dysuria, burning on urination and increased urinary frequency.  She has nausea, but no vomiting.  Patient states that she has chronic intermittent diarrhea which has not changed.  She has mild cough and shortness of breath at baseline, which has not changed.  Denies any chest pain.  She states that she has chronic bilateral leg weakness, right leg is worse than the left.  She uses cane for walking at home.  She states that she has chronic back pain, which has worsened today.  ED Course: pt was found to have WBC 13.9, lactic acid 1.7, positive urinalysis (hazy appearance, large amount of leukocyte, rare bacteria, WBC 21-50), normal LFT, negative COVID 19 test, stable renal function, temperature well 1.4, tachycardia, tachypnea, oxygen saturation 92 to 99% on 3 L oxygen in ED.  Chest x-ray showed peribronchial thickening and cardiomegaly.  Patient is admitted to telemetry bed as inpatient.  # CT abdomen/pelvis showed: 1. New atrophy and decreased enhancement of the upper left kidney, with mild perinephric edema. This may be  sequela of prior infection or subacute ischemia/infarct. Calcifications in the upper left kidney likely combination of parenchymal calcifications and nonobstructing stone. 2. Retroperitoneal edema the level of the iliac bifurcation anterior to L5-S1. No definite associated vascular abnormality. This can be seen with retroperitoneal fibrosis. Given reported history of back pain, lumbar spine MRI could be considered to exclude possibility of CT occult discitis/osteomyelitis. 3. Mild gallbladder distention without pericholecystic inflammation. 4. Aortic atherosclerosis (ICD10-I70.0).   Review of Systems:   General: has fevers, chills, no body weight gain, has poor appetite, has fatigue HEENT: no blurry vision, hearing changes or sore throat Respiratory: has dyspnea, coughing, no wheezing CV: no chest pain, no palpitations GI: has nausea, abdominal pain, diarrhea, no constipation, vomiting,  GU: no dysuria, burning on urination, increased urinary frequency, hematuria  Ext: has leg edema Neuro: no unilateral weakness, numbness, or tingling, no vision change or hearing loss. Skin: no rash, no skin tear. MSK: has lower back pain. Has bilateral leg weakness Heme: No easy bruising.  Travel history: No recent long distant travel.  Allergy:  Allergies  Allergen Reactions  . Baclofen Other (See Comments)    Causes seizures  . Imdur [Isosorbide Dinitrate] Other (See Comments)    Severe persistent headache  . Naproxen Palpitations    Past Medical History:  Diagnosis Date  . Acute diastolic CHF (congestive heart failure) ([place]) 08/24/2015  . Cervicalgia   . Chronic back pain   . COPD (chronic obstructive pulmonary disease) ([facility])    on 4L home O2  . Essential  hypertension 08/24/2015  . GERD (gastroesophageal reflux disease)   . Headache(784.0)   . Heart murmur   . History of kidney stones   . Hyperlipidemia   . Migraine without aura, with intractable migraine, so stated, without mention of  status migrainosus   . OSA on CPAP    she no longer wears CPAP  . Osteoarthritis   . Pulmonary embolism ([place])   . PVD (peripheral vascular disease) ([place])   . Seizures ([place]) 07/2014, 10/2014   due to baclofen  . Shortness of breath dyspnea   . Trigeminal neuralgia     Past Surgical History:  Procedure Laterality Date  . LEFT HEART CATH AND CORONARY ANGIOGRAPHY N/A 10/01/2017   Procedure: LEFT HEART CATH AND CORONARY ANGIOGRAPHY;  Surgeon: Leonie Man, MD;  Location: [facility];  Service: Cardiovascular;  Laterality: N/A;  . MULTIPLE TOOTH EXTRACTIONS     "they took out part of my teeth; I took out the rest when they got loose; was suppose to get dentures; never did"  . NO PAST SURGERIES    . URETEROSCOPY WITH HOLMIUM LASER LITHOTRIPSY Left 07/09/2017   Procedure: URETEROSCOPY WITH HOLMIUM LASER LITHOTRIPSY/ STENT;  Surgeon: Festus Aloe, MD;  Location: [facility];  Service: Urology;  Laterality: Left;  . URETEROSCOPY WITH HOLMIUM LASER LITHOTRIPSY Left 07/23/2017   Procedure: URETEROSCOPY WITH HOLMIUM LASER LITHOTRIPSY /STENT;  Surgeon: Festus Aloe, MD;  Location: [facility];  Service: Urology;  Laterality: Left;  NEEDS 60 MINUTES FOR PROCEDURE    Social History:  reports that she quit smoking about 8 years ago. Her smoking use included cigarettes. She has a 9.00 pack-year smoking history. She has never used smokeless tobacco. She reports that she does not drink alcohol or use drugs.  Family History:  Family History  Problem Relation Age of Onset  . Heart attack Father   . Diabetes Mother   . Dementia Mother   . Diabetes Brother   . Heart disease Unknown   . Diabetes Unknown      Prior to Admission medications   Medication Sig Start Date End Date Taking? Authorizing Provider  carvedilol (COREG) 3.125 MG tablet TAKE 1 TABLET BY MOUTH 2 TIMES DAILY WITH A MEAL Patient taking differently: Take 3.125 mg by mouth 2 (two) times daily with a meal.  04/14/18  Yes Janith Lima, MD  ELIQUIS 5 MG TABS tablet TAKE 1 TABLET BY MOUTH TWICE A DAY Patient taking differently: Take 5 mg by mouth 2 (two) times daily.  04/28/18  Yes Janith Lima, MD  furosemide (LASIX) 40 MG tablet TAKE 1 TABLET (40 MG TOTAL) BY MOUTH DAILY. Patient not taking: Reported on 01/10/2019 04/20/18   Janith Lima, MD  gabapentin (NEURONTIN) 400 MG capsule Take 1 capsule (400 mg total) by mouth 2 (two) times daily. Patient not taking: Reported on 01/10/2019 12/20/18   Janith Lima, MD  ipratropium-albuterol (DUONEB) 0.5-2.5 (3) MG/3ML SOLN Take 3 mLs by nebulization 3 (three) times daily. Patient not taking: Reported on 01/10/2019 04/14/18   Janith Lima, MD  OXYGEN Inhale 4 L into the lungs continuous.     [provider]  pantoprazole (PROTONIX) 20 MG tablet TAKE 1 TABLET BY MOUTH EVERY DAY Patient not taking: No sig reported 07/21/18   Lendon Colonel, NP  tiZANidine (ZANAFLEX) 2 MG tablet Take 1 tablet (2 mg total) by mouth every 8 (eight) hours as needed for muscle spasms. For migraine Patient not taking: Reported on  01/10/2019 12/24/18   Janith Lima, MD  umeclidinium-vilanterol (ANORO ELLIPTA) 62.5-25 MCG/INH AEPB Inhale 1 puff into the lungs daily. Patient not taking: Reported on 01/10/2019 08/12/18   Janith Lima, MD    Physical Exam: Vitals:   01/10/19 0030 01/10/19 0210 01/10/19 0230 01/10/19 0416  BP: 140/77  (!) 141/92 137/85  Pulse:   (!) 114 (!) 116  Resp: (!) 24  (!) 23 20  Temp:  100.3 F (37.9 C)  99.7 F (37.6 C)  TempSrc:  Oral  Oral  SpO2:   92% 95%   General: Not in acute distress.  Morbid obesity HEENT:       Eyes: PERRL, EOMI, no scleral icterus.       ENT: No discharge from the ears and nose, no pharynx injection, no tonsillar enlargement.        Neck: No JVD, no bruit, no mass felt. Heme: No neck lymph node enlargement. Cardiac: S1/S2, RRR, No murmurs, No gallops or rubs. Respiratory: No rales, wheezing, rhonchi or rubs. GI: Soft,  nondistended, has tenderness mainly in RLQ, no rebound pain, no organomegaly, BS present. GU: No hematuria Ext: 1+ pitting leg edema bilaterally. 2+DP/PT pulse bilaterally. Musculoskeletal: No joint deformities, No joint redness or warmth, no limitation of ROM in spin. Skin: No rashes.  Neuro: Alert, oriented X3, cranial nerves II-XII grossly intact, moves all extremities.  Psych: Patient is not psychotic, no suicidal or hemocidal ideation.  Labs on Admission: I have personally reviewed following labs and imaging studies  CBC: Recent Labs  Lab 01/10/19 0059  WBC 13.9*  NEUTROABS 10.7*  HGB 13.9  HCT 46.7*  MCV 90.9  PLT 919*   Basic Metabolic Panel: Recent Labs  Lab 01/09/19 2211  NA 137  K 4.5  CL 102  CO2 22  GLUCOSE 125*  BUN 25*  CREATININE 1.16*  CALCIUM 9.4   GFR: CrCl cannot be calculated (Unknown ideal weight.). Liver Function Tests: Recent Labs  Lab 01/09/19 2211  AST 27  ALT 25  ALKPHOS 69  BILITOT 1.1  PROT 8.0  ALBUMIN 3.6   No results for input(s): LIPASE, AMYLASE in the last 168 hours. No results for input(s): AMMONIA in the last 168 hours. Coagulation Profile: No results for input(s): INR, PROTIME in the last 168 hours. Cardiac Enzymes: No results for input(s): CKTOTAL, CKMB, CKMBINDEX, TROPONINI in the last 168 hours. BNP (last 3 results) No results for input(s): PROBNP in the last 8760 hours. HbA1C: No results for input(s): HGBA1C in the last 72 hours. CBG: No results for input(s): GLUCAP in the last 168 hours. Lipid Profile: No results for input(s): CHOL, HDL, LDLCALC, TRIG, CHOLHDL, LDLDIRECT in the last 72 hours. Thyroid Function Tests: No results for input(s): TSH, T4TOTAL, FREET4, T3FREE, THYROIDAB in the last 72 hours. Anemia Panel: No results for input(s): VITAMINB12, FOLATE, FERRITIN, TIBC, IRON, RETICCTPCT in the last 72 hours. Urine analysis:    Component Value Date/Time   COLORURINE YELLOW 01/10/2019 0205   APPEARANCEUR  HAZY (A) 01/10/2019 0205   LABSPEC 1.042 (H) 01/10/2019 0205   PHURINE 5.0 01/10/2019 0205   GLUCOSEU NEGATIVE 01/10/2019 0205   GLUCOSEU NEGATIVE 05/20/2017 1705   HGBUR SMALL (A) 01/10/2019 0205   BILIRUBINUR NEGATIVE 01/10/2019 0205   KETONESUR NEGATIVE 01/10/2019 0205   PROTEINUR NEGATIVE 01/10/2019 0205   UROBILINOGEN 0.2 05/20/2017 1705   NITRITE NEGATIVE 01/10/2019 0205   LEUKOCYTESUR LARGE (A) 01/10/2019 0205   Sepsis Labs: @LABRCNTIP (procalcitonin:4,lacticidven:4) ) Recent Results (from the past 240  hour(s))  SARS Coronavirus 2 (CEPHEID - Performed in [place] hospital lab), Hosp Order     Status: None   Collection Time: 01/09/19 11:36 PM  Result Value Ref Range Status   SARS Coronavirus 2 NEGATIVE NEGATIVE Final    Comment: (NOTE) If result is NEGATIVE SARS-CoV-2 target nucleic acids are NOT DETECTED. The SARS-CoV-2 RNA is generally detectable in upper and lower  respiratory specimens during the acute phase of infection. The lowest  concentration of SARS-CoV-2 viral copies this assay can detect is 250  copies / mL. A negative result does not preclude SARS-CoV-2 infection  and should not be used as the sole basis for treatment or other  patient management decisions.  A negative result may occur with  improper specimen collection / handling, submission of specimen other  than nasopharyngeal swab, presence of viral mutation(s) within the  areas targeted by this assay, and inadequate number of viral copies  (<250 copies / mL). A negative result must be combined with clinical  observations, patient history, and epidemiological information. If result is POSITIVE SARS-CoV-2 target nucleic acids are DETECTED. The SARS-CoV-2 RNA is generally detectable in upper and lower  respiratory specimens dur ing the acute phase of infection.  Positive  results are indicative of active infection with SARS-CoV-2.  Clinical  correlation with patient history and other diagnostic  information is  necessary to determine patient infection status.  Positive results do  not rule out bacterial infection or co-infection with other viruses. If result is PRESUMPTIVE POSTIVE SARS-CoV-2 nucleic acids MAY BE PRESENT.   A presumptive positive result was obtained on the submitted specimen  and confirmed on repeat testing.  While 2019 novel coronavirus  (SARS-CoV-2) nucleic acids may be present in the submitted sample  additional confirmatory testing may be necessary for epidemiological  and / or clinical management purposes  to differentiate between  SARS-CoV-2 and other Sarbecovirus currently known to infect humans.  If clinically indicated additional testing with an alternate test  methodology [phone]) is advised. The SARS-CoV-2 RNA is generally  detectable in upper and lower respiratory sp ecimens during the acute  phase of infection. The expected result is Negative. Fact Sheet for Patients:  StrictlyIdeas.no Fact Sheet for Healthcare Providers: BankingDealers.co.za This test is not yet approved or cleared by the [place] FDA and has been authorized for detection and/or diagnosis of SARS-CoV-2 by FDA under an Emergency Use Authorization (EUA).  This EUA will remain in effect (meaning this test can be used) for the duration of the COVID-19 declaration under Section 564(b)(1) of the Act, 21 U.S.C. section 360bbb-3(b)(1), unless the authorization is terminated or revoked sooner. Performed at [facility], [place] [address], [place], [place] [address]      Radiological Exams on Admission: Ct Abdomen Pelvis W Contrast  Result Date: 01/10/2019 CLINICAL DATA:  Right-sided abdominal pain. Back pain. Fever. Nausea. EXAM: CT ABDOMEN AND PELVIS WITH CONTRAST TECHNIQUE: Multidetector CT imaging of the abdomen and pelvis was performed using the standard protocol following bolus administration of intravenous contrast. CONTRAST:   180mL OMNIPAQUE IOHEXOL 300 MG/ML  SOLN COMPARISON:  CT 12/02/2016 FINDINGS: Lower chest: Cardiomegaly. Linear atelectasis in both lower lobes. Hepatobiliary: No focal hepatic abnormality. Mild gallbladder distention without pericholecystic inflammation. No calcified gallstone. No biliary dilatation. Pancreas: No ductal dilatation or inflammation. Spleen: Normal in size without focal abnormality. Adrenals/Urinary Tract: No adrenal nodule. Decreased enhancement of the left upper kidney with progressive atrophy from prior exam. Mild associated perinephric edema in the  upper pole. Small calcifications in the left upper kidney, likely combination of parenchymal and renal stones. No hydronephrosis. No right hydronephrosis or perinephric edema. There are 2 simple cysts in the right kidney. Urinary bladder is distended without wall thickening. No bladder stone. Stomach/Bowel: Bowel evaluation is limited in the absence of enteric contrast. Stomach physiologically distended. Duodenal diverticulum about the fourth portion without inflammation. No small bowel obstruction or inflammation. Ascending and transverse colon is distended with air and stool without wall thickening or obstruction. The descending colon is decompressed. Vascular/Lymphatic: Retroperitoneal stranding at the iliac bifurcation without definite vessel wall thickening. Normal caliber abdominal aorta. Mild aortic atherosclerosis. Retroaortic left renal vein. Small retroperitoneal lymph nodes. Reproductive: Uterus and bilateral adnexa are unremarkable. Other: Postsurgical change of the anterior abdominal wall. No ascites. No free air. No intra-abdominal abscess. Musculoskeletal: Retroperitoneal edema level of the iliac bifurcation anterior to L5-S1. There is associated degenerative disc disease but no frank bony destructive change. IMPRESSION: 1. New atrophy and decreased enhancement of the upper left kidney, with mild perinephric edema. This may be sequela of  prior infection or subacute ischemia/infarct. Calcifications in the upper left kidney likely combination of parenchymal calcifications and nonobstructing stone. 2. Retroperitoneal edema the level of the iliac bifurcation anterior to L5-S1. No definite associated vascular abnormality. This can be seen with retroperitoneal fibrosis. Given reported history of back pain, lumbar spine MRI could be considered to exclude possibility of CT occult discitis/osteomyelitis. 3. Mild gallbladder distention without pericholecystic inflammation. 4. Aortic atherosclerosis (ICD10-I70.0). Electronically Signed   By: Keith Rake M.D.   On: 01/10/2019 02:27   Dg Chest Port 1 View  Result Date: 01/09/2019 CLINICAL DATA:  Fever. Bilateral leg swelling. Chest pain. Shortness of breath. EXAM: PORTABLE CHEST 1 VIEW COMPARISON:  Chest CT 07/28/2018. Most recent radiograph 03/27/2018 FINDINGS: Unchanged cardiomegaly and mediastinal contours. Unchanged bilateral hilar enlargement consistent with enlarged pulmonary arteries as seen on CT. Increased peribronchial thickening from prior exam. No focal airspace disease. Minimal scarring in the left lung. No pleural fluid or pneumothorax IMPRESSION: 1. Increased peribronchial thickening from prior exam may be pulmonary edema or bronchitis. 2. Stable cardiomegaly and enlarged pulmonary arteries. Electronically Signed   By: Keith Rake M.D.   On: 01/09/2019 22:42        EKG: Independently reviewed.  Sinus rhythm, QTC 442, LAE, LAD, PVC, nonspecific T wave change  Assessment/Plan Principal Problem:   Acute pyelonephritis Active Problems:   GERD   Chronic back pain   OSA on CPAP   COPD (chronic obstructive pulmonary disease) ([facility])   Essential hypertension   Chronic respiratory failure with hypoxia ([facility])   Sepsis ([facility])   Chronic diastolic CHF (congestive heart failure) ([facility])   Abdominal pain   PE (pulmonary thromboembolism) ([facility])   Sepsis due to acute pyelonephritis:  Patient has typical symptoms of urinary tract infection, positive urinalysis.  CT scan showed new atrophy and decreased enhancement of the upper left kidney, with mild perinephric edema. Consistent with pyelonephritis.  Patient meets critical for sepsis with leukocytosis, fever, tachycardia and tachypnea.  Lactic acid is normal.  Currently hemodynamically stable.   - Admit to telemetry bed as inpt -  Ceftriaxone by IV - Follow up results of urine and blood cx and amend antibiotic regimen if needed per sensitivity results - prn Zofran for nausea - will get Procalcitonin and trend lactic acid levels per sepsis protocol. -  IVF: will not give IVF since pt has risk of developing fluid overload and also because  of normal lactic acid level  Abdominal pain: Etiology is not clear.  May be due to pyelonephritis.  -will check lipase -PRN morphine and Percocet for pain -PRN Zofran for nausea  Chronic back pain: Patient has chronic back pain, now pain has worsened per patient. CT-abd/pelvis showed retroperitoneal edema at the level of the iliac bifurcation anterior to L5-S1.  -will get MRI-L spin for further evaluation, and to rule out discitis -Pain control as above  GERD: -Protonix  OSA -on CPAP  Chronic respiratory failure with hypoxia and COPD (chronic obstructive pulmonary disease) ([place]): stable -Continue bronchodilators  Essential hypertension: -Coreg - IV hydralazine.  Chronic diastolic CHF (congestive heart failure) ([place]): 2D echo on 07/29/2018 showed EF 50-60% with grade 1 diastolic dysfunction.  Difficult to assess patient's volume status due to morbid obesity, but the patient states that she has not taken her Lasix for more than 3 weeks.  She has gained 15 pounds recently. Currently patient does not have worsening shortness of breath.  CHF seem to be compensated, but patient is at high risk of developing CHF exacerbation. -Hold Lasix due to sepsis -Check BNP -will need to resume Lasix  as soon as her sepsis is controlled.  Hx of PE: Patient missed dose yesterday.  No worsening shortness of breath or chest pain. -continue Eliquis     Inpatient status:  # Patient requires inpatient status due to high intensity of service, high risk for further deterioration and high frequency of surveillance required.  I certify that at the point of admission it is my clinical judgment that the patient will require inpatient hospital care spanning beyond 2 midnights from the point of admission.  . This patient has multiple chronic comorbidities including hypertension, hyperlipidemia, COPD on 4 L oxygen, dCHF, GERD, migraine, OSA on CPAP, morbid obesity, kidney stone, CKD 3 . Now patient has presenting with sepsis due to acute pyelonephritis.  Patient also has worsening back pain.  . The worrisome physical exam findings include abdominal tenderness, lower back tenderness. . The initial radiographic and laboratory data are worrisome because of leukocytosis, sepsis, positive urinalysis. CT scan showed new atrophy and decreased enhancement of the upper left kidney, with mild perinephric edema; CT scan also showed retroperitoneal edema at the level of the iliac bifurcation anterior to L5-S1.  . Current medical needs: please see my assessment and plan . Predictability of an adverse outcome (risk): Patient has multiple comorbidities as listed above, now presents with several acute severe issues, including sepsis due to pyelonephritis, abdominal pain, worsening back pain with abnormal findings on CT scan, which needs further work-up.  Patient's presentation is highly complicated, is at high risk of deteriorating.  Will need to be treated in hospital for at least 2 days.  DVT ppx: On Eliquis Code Status: Full code (I discussed with patient, and explained the meaning of CODE STATUS. Patient wants to be full code) Family Communication: None at bed side.   Disposition Plan:  Anticipate discharge back to  previous home environment Consults called: None Admission status: Inpatient/tele    Date of Service 01/10/2019    [facility]   If 7PM-7AM, please contact night-coverage [email or web address] Password [facility] 01/10/2019, 5:24 AM

## 2019-01-10 NOTE — ED Notes (Signed)
pts sister, Kennyth Lose, would like pt to call her if available

## 2019-01-10 NOTE — ED Provider Notes (Signed)
I assumed care of this patient from Dr. Ralene Bathe at 0000.  Please see their note for further details of Hx, PE.  Briefly patient is a 73 y.o. female who presented with right-sided pain noted to be febrile.  Source likely urinary.  Patient already given Rocephin.  Currently awaiting labs, urine and CT scan to rule out other intra-abdominal inflammatory/infectious process..   Labs notable for leukocytosis.  Urinary tract infection.  CT with left renal edema concerning for pyelonephritis.  Also noted to have L5-S1 edema.  Patient admitted to medicine for continued IV antibiotics and assess for possible discitis/osteomyelitis with MRI of the lumbar spine.      Fatima Blank, MD 01/10/19 [phone]

## 2019-01-10 NOTE — Progress Notes (Signed)
Patient CPAP setup and tried to place on patient. Patient continually reached up and removed CPAP mask and swatted me away. Patient is on 4L O2 at all times and she is not wanting to wear the CPAP at this time. Will leave machine in room and try again tomorrow night or when we can better talk tot he patient. But not a good idea to place on tonight while she could remove her mask and desat quickly without the O2 she is getting regularly.

## 2019-01-10 NOTE — Progress Notes (Addendum)
Notified MD Nevada Crane via amion text that EKG had been abnormal- it was not able to upload to epic but a copy is in the chart.    Paulla Fore, RN

## 2019-01-10 NOTE — ED Notes (Signed)
Brother called at request of patient aware patient is being admitted and that she is going to 18M-10  [name]) [phone]

## 2019-01-10 NOTE — Progress Notes (Signed)
Janice Morrow is a 73 y.o. female with medical history significant of hypertension, hyperlipidemia, COPD on 4 L oxygen, dCHF, GERD, migraine, OSA on CPAP, morbid obesity, kidney stone, CKD 3, who presents with abdominal pain, back pain, fever, chills, dysuria.  01/10/19: Patient seen and examined at bedside.  This morning she has no new complaints.  Early this afternoon RN reports patient has chest pain.  Stat twelve-lead EKG and stat troponin obtained.  We will continue to monitor closely.    Please refer to H&P dictated by Dr. Blaine Hamper on 01/10/2019 for further details of the assessment and plan.

## 2019-01-10 NOTE — ED Notes (Signed)
Janice Morrow- [phone]

## 2019-01-10 NOTE — ED Notes (Signed)
Transported to MRI on o2 at 4l/[place]

## 2019-01-10 NOTE — ED Notes (Signed)
Patient transported to CT 

## 2019-01-10 NOTE — ED Notes (Signed)
Patient transported to MRI 

## 2019-01-10 NOTE — ED Notes (Signed)
Back from MRI  Transported to 35M-10

## 2019-01-10 NOTE — ED Notes (Signed)
ED TO INPATIENT HANDOFF REPORT  ED Nurse Name and Phone #: Ophelia Charter RN  #[phone]  S Name/Age/Gender Janice Morrow 73 y.o. female Room/Bed: 028C/028C  Code Status   Code Status: Full Code  Home/SNF/Other Home Patient oriented to: self, place, time and situation Is this baseline? yes  Triage Complete: Triage complete  Chief Complaint UTI  Triage Note No notes on file   Allergies Allergies  Allergen Reactions  . Baclofen Other (See Comments)    Causes seizures  . Imdur [Isosorbide Dinitrate] Other (See Comments)    Severe persistent headache  . Naproxen Palpitations    Level of Care/Admitting Diagnosis ED Disposition    ED Disposition Condition [facility]: [place] [[id number]]  Level of Care: Telemetry Medical [104]  Covid Evaluation: N/A  Diagnosis: Acute pyelonephritis [564332]  Admitting Physician: Ivor Costa [[id number]]  Attending Physician: Ivor Costa [phone]  Estimated length of stay: past midnight tomorrow  Certification:: I certify this patient will need inpatient services for at least 2 midnights  PT Class (Do Not Modify): Inpatient [101]  PT Acc Code (Do Not Modify): Private [1]       B Medical/Surgery History Past Medical History:  Diagnosis Date  . Acute diastolic CHF (congestive heart failure) ([place]) 08/24/2015  . Cervicalgia   . Chronic back pain   . COPD (chronic obstructive pulmonary disease) ([facility])    on 4L home O2  . Essential hypertension 08/24/2015  . GERD (gastroesophageal reflux disease)   . Headache(784.0)   . Heart murmur   . History of kidney stones   . Hyperlipidemia   . Migraine without aura, with intractable migraine, so stated, without mention of status migrainosus   . OSA on CPAP    she no longer wears CPAP  . Osteoarthritis   . Pulmonary embolism ([place])   . PVD (peripheral vascular disease) ([place])   . Seizures ([place]) 07/2014, 10/2014   due to baclofen  . Shortness of breath dyspnea    . Trigeminal neuralgia    Past Surgical History:  Procedure Laterality Date  . LEFT HEART CATH AND CORONARY ANGIOGRAPHY N/A 10/01/2017   Procedure: LEFT HEART CATH AND CORONARY ANGIOGRAPHY;  Surgeon: Leonie Man, MD;  Location: [facility];  Service: Cardiovascular;  Laterality: N/A;  . MULTIPLE TOOTH EXTRACTIONS     "they took out part of my teeth; I took out the rest when they got loose; was suppose to get dentures; never did"  . NO PAST SURGERIES    . URETEROSCOPY WITH HOLMIUM LASER LITHOTRIPSY Left 07/09/2017   Procedure: URETEROSCOPY WITH HOLMIUM LASER LITHOTRIPSY/ STENT;  Surgeon: Festus Aloe, MD;  Location: [facility];  Service: Urology;  Laterality: Left;  . URETEROSCOPY WITH HOLMIUM LASER LITHOTRIPSY Left 07/23/2017   Procedure: URETEROSCOPY WITH HOLMIUM LASER LITHOTRIPSY /STENT;  Surgeon: Festus Aloe, MD;  Location: [facility];  Service: Urology;  Laterality: Left;  NEEDS 60 MINUTES FOR PROCEDURE     A IV Location/Drains/Wounds Patient Lines/Drains/Airways Status   Active Line/Drains/Airways    Name:   Placement date:   Placement time:   Site:   Days:   Peripheral IV 01/09/19 Right;Anterior Forearm   01/09/19    2253    Forearm   1          Intake/Output Last 24 hours No intake or output data in the 24 hours ending 01/10/19 0455  Labs/Imaging Results for orders placed or performed during the hospital encounter of  01/09/19 (from the past 48 hour(s))  Lactic acid, plasma     Status: None   Collection Time: 01/09/19 10:11 PM  Result Value Ref Range   Lactic Acid, Venous 0.9 0.5 - 1.9 mmol/L    Comment: Performed at [facility], [address] [place], [place] [address]  Comprehensive metabolic panel     Status: Abnormal   Collection Time: 01/09/19 10:11 PM  Result Value Ref Range   Sodium 137 135 - 145 mmol/L   Potassium 4.5 3.5 - 5.1 mmol/L   Chloride 102 98 - 111 mmol/L   CO2 22 22 - 32 mmol/L   Glucose, Bld 125 (H) 70 - 99 mg/dL   BUN 25  (H) 8 - 23 mg/dL   Creatinine, Ser 1.16 (H) 0.44 - 1.00 mg/dL   Calcium 9.4 8.9 - 10.3 mg/dL   Total Protein 8.0 6.5 - 8.1 g/dL   Albumin 3.6 3.5 - 5.0 g/dL   AST 27 15 - 41 U/L   ALT 25 0 - 44 U/L   Alkaline Phosphatase 69 38 - 126 U/L   Total Bilirubin 1.1 0.3 - 1.2 mg/dL   GFR calc non Af Amer 48 (L) >60 mL/min   GFR calc Af Amer 55 (L) >60 mL/min   Anion gap 13 5 - 15    Comment: Performed at [facility] [address], [place], [place] [address]  SARS Coronavirus 2 (CEPHEID - Performed in [facility] lab), Hosp Order     Status: None   Collection Time: 01/09/19 11:36 PM  Result Value Ref Range   SARS Coronavirus 2 NEGATIVE NEGATIVE    Comment: (NOTE) If result is NEGATIVE SARS-CoV-2 target nucleic acids are NOT DETECTED. The SARS-CoV-2 RNA is generally detectable in upper and lower  respiratory specimens during the acute phase of infection. The lowest  concentration of SARS-CoV-2 viral copies this assay can detect is 250  copies / mL. A negative result does not preclude SARS-CoV-2 infection  and should not be used as the sole basis for treatment or other  patient management decisions.  A negative result may occur with  improper specimen collection / handling, submission of specimen other  than nasopharyngeal swab, presence of viral mutation(s) within the  areas targeted by this assay, and inadequate number of viral copies  (<250 copies / mL). A negative result must be combined with clinical  observations, patient history, and epidemiological information. If result is POSITIVE SARS-CoV-2 target nucleic acids are DETECTED. The SARS-CoV-2 RNA is generally detectable in upper and lower  respiratory specimens dur ing the acute phase of infection.  Positive  results are indicative of active infection with SARS-CoV-2.  Clinical  correlation with patient history and other diagnostic information is  necessary to determine patient infection status.  Positive results  do  not rule out bacterial infection or co-infection with other viruses. If result is PRESUMPTIVE POSTIVE SARS-CoV-2 nucleic acids MAY BE PRESENT.   A presumptive positive result was obtained on the submitted specimen  and confirmed on repeat testing.  While 2019 novel coronavirus  (SARS-CoV-2) nucleic acids may be present in the submitted sample  additional confirmatory testing may be necessary for epidemiological  and / or clinical management purposes  to differentiate between  SARS-CoV-2 and other Sarbecovirus currently known to infect humans.  If clinically indicated additional testing with an alternate test  methodology [id number]) is advised. The SARS-CoV-2 RNA is generally  detectable in upper and lower respiratory sp  ecimens during the acute  phase of infection. The expected result is Negative. Fact Sheet for Patients:  StrictlyIdeas.no Fact Sheet for Healthcare Providers: BankingDealers.co.za This test is not yet approved or cleared by the [place] FDA and has been authorized for detection and/or diagnosis of SARS-CoV-2 by FDA under an Emergency Use Authorization (EUA).  This EUA will remain in effect (meaning this test can be used) for the duration of the COVID-19 declaration under Section 564(b)(1) of the Act, 21 U.S.C. section 360bbb-3(b)(1), unless the authorization is terminated or revoked sooner. Performed at [facility], [place] [address] [place], [place] [address]   Lactic acid, plasma     Status: None   Collection Time: 01/10/19 12:59 AM  Result Value Ref Range   Lactic Acid, Venous 1.7 0.5 - 1.9 mmol/L    Comment: Performed at [facility] [address], [place], [place] [address]  CBC with Differential     Status: Abnormal   Collection Time: 01/10/19 12:59 AM  Result Value Ref Range   WBC 13.9 (H) 4.0 - 10.5 K/uL   RBC 5.14 (H) 3.87 - 5.11 MIL/uL   Hemoglobin 13.9 12.0 - 15.0 g/dL   HCT 46.7 (H)  36.0 - 46.0 %   MCV 90.9 80.0 - 100.0 fL   MCH 27.0 26.0 - 34.0 pg   MCHC 29.8 (L) 30.0 - 36.0 g/dL   RDW 15.4 11.5 - 15.5 %   Platelets 145 (L) 150 - 400 K/uL   nRBC 0.0 0.0 - 0.2 %   Neutrophils Relative % 77 %   Neutro Abs 10.7 (H) 1.7 - 7.7 K/uL   Lymphocytes Relative 11 %   Lymphs Abs 1.5 0.7 - 4.0 K/uL   Monocytes Relative 11 %   Monocytes Absolute 1.5 (H) 0.1 - 1.0 K/uL   Eosinophils Relative 0 %   Eosinophils Absolute 0.0 0.0 - 0.5 K/uL   Basophils Relative 0 %   Basophils Absolute 0.0 0.0 - 0.1 K/uL   Immature Granulocytes 1 %   Abs Immature Granulocytes 0.07 0.00 - 0.07 K/uL    Comment: Performed at [facility], [place] [address], [place], [place] [address]  Urinalysis, Routine w reflex microscopic     Status: Abnormal   Collection Time: 01/10/19  2:05 AM  Result Value Ref Range   Color, Urine YELLOW YELLOW   APPearance HAZY (A) CLEAR   Specific Gravity, Urine 1.042 (H) 1.005 - 1.030   pH 5.0 5.0 - 8.0   Glucose, UA NEGATIVE NEGATIVE mg/dL   Hgb urine dipstick SMALL (A) NEGATIVE   Bilirubin Urine NEGATIVE NEGATIVE   Ketones, ur NEGATIVE NEGATIVE mg/dL   Protein, ur NEGATIVE NEGATIVE mg/dL   Nitrite NEGATIVE NEGATIVE   Leukocytes,Ua LARGE (A) NEGATIVE   RBC / HPF 0-5 0 - 5 RBC/hpf   WBC, UA 21-50 0 - 5 WBC/hpf   Bacteria, UA RARE (A) NONE SEEN   Squamous Epithelial / LPF 0-5 0 - 5    Comment: Performed at [facility], [place] [address], [place], [place] [address]   Ct Abdomen Pelvis W Contrast  Result Date: 01/10/2019 CLINICAL DATA:  Right-sided abdominal pain. Back pain. Fever. Nausea. EXAM: CT ABDOMEN AND PELVIS WITH CONTRAST TECHNIQUE: Multidetector CT imaging of the abdomen and pelvis was performed using the standard protocol following bolus administration of intravenous contrast. CONTRAST:  153mL OMNIPAQUE IOHEXOL 300 MG/ML  SOLN COMPARISON:  CT 12/02/2016 FINDINGS: Lower chest: Cardiomegaly. Linear atelectasis in both  lower lobes. Hepatobiliary: No  focal hepatic abnormality. Mild gallbladder distention without pericholecystic inflammation. No calcified gallstone. No biliary dilatation. Pancreas: No ductal dilatation or inflammation. Spleen: Normal in size without focal abnormality. Adrenals/Urinary Tract: No adrenal nodule. Decreased enhancement of the left upper kidney with progressive atrophy from prior exam. Mild associated perinephric edema in the upper pole. Small calcifications in the left upper kidney, likely combination of parenchymal and renal stones. No hydronephrosis. No right hydronephrosis or perinephric edema. There are 2 simple cysts in the right kidney. Urinary bladder is distended without wall thickening. No bladder stone. Stomach/Bowel: Bowel evaluation is limited in the absence of enteric contrast. Stomach physiologically distended. Duodenal diverticulum about the fourth portion without inflammation. No small bowel obstruction or inflammation. Ascending and transverse colon is distended with air and stool without wall thickening or obstruction. The descending colon is decompressed. Vascular/Lymphatic: Retroperitoneal stranding at the iliac bifurcation without definite vessel wall thickening. Normal caliber abdominal aorta. Mild aortic atherosclerosis. Retroaortic left renal vein. Small retroperitoneal lymph nodes. Reproductive: Uterus and bilateral adnexa are unremarkable. Other: Postsurgical change of the anterior abdominal wall. No ascites. No free air. No intra-abdominal abscess. Musculoskeletal: Retroperitoneal edema level of the iliac bifurcation anterior to L5-S1. There is associated degenerative disc disease but no frank bony destructive change. IMPRESSION: 1. New atrophy and decreased enhancement of the upper left kidney, with mild perinephric edema. This may be sequela of prior infection or subacute ischemia/infarct. Calcifications in the upper left kidney likely combination of parenchymal calcifications and nonobstructing stone. 2.  Retroperitoneal edema the level of the iliac bifurcation anterior to L5-S1. No definite associated vascular abnormality. This can be seen with retroperitoneal fibrosis. Given reported history of back pain, lumbar spine MRI could be considered to exclude possibility of CT occult discitis/osteomyelitis. 3. Mild gallbladder distention without pericholecystic inflammation. 4. Aortic atherosclerosis (ICD10-I70.0). Electronically Signed   By: Keith Rake M.D.   On: 01/10/2019 02:27   Dg Chest Port 1 View  Result Date: 01/09/2019 CLINICAL DATA:  Fever. Bilateral leg swelling. Chest pain. Shortness of breath. EXAM: PORTABLE CHEST 1 VIEW COMPARISON:  Chest CT 07/28/2018. Most recent radiograph 03/27/2018 FINDINGS: Unchanged cardiomegaly and mediastinal contours. Unchanged bilateral hilar enlargement consistent with enlarged pulmonary arteries as seen on CT. Increased peribronchial thickening from prior exam. No focal airspace disease. Minimal scarring in the left lung. No pleural fluid or pneumothorax IMPRESSION: 1. Increased peribronchial thickening from prior exam may be pulmonary edema or bronchitis. 2. Stable cardiomegaly and enlarged pulmonary arteries. Electronically Signed   By: Keith Rake M.D.   On: 01/09/2019 22:42    Pending Labs Unresulted Labs (From admission, onward)    Start     Ordered   01/10/19 7591  Basic metabolic panel  Tomorrow morning,   R     01/10/19 0427   01/10/19 0500  CBC  Tomorrow morning,   R     01/10/19 0427   01/10/19 0427  Protime-INR  Once,   R     01/10/19 0427   01/10/19 0427  APTT  Once,   R     01/10/19 0427   01/10/19 0425  Lipase, blood  Once,   R     01/10/19 0424   01/10/19 0424  Brain natriuretic peptide  Once,   R     01/10/19 0424   01/09/19 2316  CBC with Differential  ONCE - STAT,   STAT     01/09/19 2315   01/09/19 2203  Blood Culture (routine x  2)  BLOOD CULTURE X 2,   STAT     01/09/19 2204   01/09/19 2203  Urine culture  ONCE - STAT,    STAT     01/09/19 2204          Vitals/Pain Today's Vitals   01/10/19 0212 01/10/19 0230 01/10/19 0345 01/10/19 0416  BP:  (!) 141/92  137/85  Pulse:  (!) 114  (!) 116  Resp:  (!) 23  20  Temp:    99.7 F (37.6 C)  TempSrc:    Oral  SpO2:  92%  95%  PainSc: 9   7      Isolation Precautions No active isolations  Medications Medications  cefTRIAXone (ROCEPHIN) 1 g in sodium chloride 0.9 % 100 mL IVPB (0 g Intravenous Stopped 01/10/19 0038)  carvedilol (COREG) tablet 3.125 mg (has no administration in time range)  apixaban (ELIQUIS) tablet 5 mg (has no administration in time range)  levalbuterol (XOPENEX) nebulizer solution 0.63 mg (has no administration in time range)  oxyCODONE-acetaminophen (PERCOCET/ROXICET) 5-325 MG per tablet 1 tablet (has no administration in time range)  morphine 2 MG/ML injection 2 mg (has no administration in time range)  acetaminophen (TYLENOL) tablet 650 mg (has no administration in time range)    Or  acetaminophen (TYLENOL) suppository 650 mg (has no administration in time range)  ondansetron (ZOFRAN) tablet 4 mg (has no administration in time range)    Or  ondansetron (ZOFRAN) injection 4 mg (has no administration in time range)  acetaminophen (TYLENOL) tablet 1,000 mg (1,000 mg Oral Given 01/10/19 0108)  fentaNYL (SUBLIMAZE) injection 75 mcg (75 mcg Intravenous Given 01/10/19 0111)  iohexol (OMNIPAQUE) 300 MG/ML solution 125 mL (125 mLs Intravenous Contrast Given 01/10/19 0127)  HYDROmorphone (DILAUDID) injection 0.5 mg (0.5 mg Intravenous Given 01/10/19 0422)  diazepam (VALIUM) injection 2.5 mg (2.5 mg Intravenous Given 01/10/19 0447)    Mobility walks with device     Focused Assessments    R Recommendations: See Admitting Provider Note  Report given to:   Additional Notes:

## 2019-01-11 MED ORDER — SENNOSIDES-DOCUSATE SODIUM 8.6-50 MG PO TABS
2.0000 | ORAL_TABLET | Freq: Two times a day (BID) | ORAL | Status: DC
  Administered 2019-01-11 – 2019-01-14 (×7): 2 via ORAL
  Filled 2019-01-11 (×9): qty 2

## 2019-01-11 MED ORDER — GABAPENTIN 400 MG PO CAPS
400.0000 mg | ORAL_CAPSULE | Freq: Three times a day (TID) | ORAL | Status: DC
  Administered 2019-01-11 – 2019-01-15 (×13): 400 mg via ORAL
  Filled 2019-01-11 (×13): qty 1

## 2019-01-11 MED ORDER — HYDROCORTISONE NA SUCCINATE PF 100 MG IJ SOLR
50.0000 mg | Freq: Three times a day (TID) | INTRAMUSCULAR | Status: DC
  Administered 2019-01-11 – 2019-01-13 (×8): 50 mg via INTRAVENOUS
  Filled 2019-01-11 (×8): qty 2

## 2019-01-11 MED ORDER — GABAPENTIN 800 MG PO TABS
400.0000 mg | ORAL_TABLET | Freq: Three times a day (TID) | ORAL | Status: DC
  Filled 2019-01-11 (×3): qty 0.5

## 2019-01-11 NOTE — Progress Notes (Signed)
Patient refuse NIV for the night, machine in the room in case patient changes her mind.

## 2019-01-11 NOTE — Progress Notes (Signed)
PT Cancellation Note  Patient Details Name: Janice Morrow MRN: [id number] DOB: [birth year]   Cancelled Treatment:    Reason Eval/Treat Not Completed: Patient at procedure or test/unavailable;Pain limiting ability to participate Attempted to evaluate patient, pt currently with nursing, extensive bathing and appears to be in high levels of pain. Will cont to follow.   Reinaldo Berber, PT, DPT Acute Rehabilitation Services Pager: [phone] Office: [place] 01/11/2019, 2:20 PM

## 2019-01-11 NOTE — Progress Notes (Addendum)
PROGRESS NOTE  Janice Morrow ASN:[id number] DOB: 1945-12-31 DOA: 01/09/2019 PCP: Janice Lima, MD  HPI/Recap of past 24 hours: Janice Morrow a 73 y.o.femalewith medical history significant ofhypertension, hyperlipidemia, COPD on 4 L oxygen,dCHF, GERD, migraine, OSA on CPAP, morbid obesity, kidney stone, CKD 3, who presents with abdominal pain, back pain, fever, chills, dysuria.  Leukocytosis, positive UA and suspected left pyelonephritis on CT scan.  Admitted for acute pyelonephritis.  01/11/19: Patient seen and examined at her bedside reports severe lower back pain and bilateral lower extremity burning.  Viewed her spine MRI which revealed no L4-L5 disc protrusion.  Started on gabapentin and IV steroids.    Assessment/Plan: Principal Problem:   Acute pyelonephritis Active Problems:   GERD   Chronic back pain   OSA on CPAP   COPD (chronic obstructive pulmonary disease) (HCC)   Essential hypertension   Chronic respiratory failure with hypoxia (HCC)   Sepsis (HCC)   Chronic diastolic CHF (congestive heart failure) (HCC)   Abdominal pain   PE (pulmonary thromboembolism) ([place])  Sepsis secondary to acute left pyelonephritis Presented with leukocytosis, positive UA and suspected left pyelonephritis CT scan, tachycardia and tachypnea. Urine culture greater than 100,000 colonies of E. coli Blood cultures x2 negative to date Continue Rocephin Monitor fever curve Monitor WBC Repeat CBC in the morning  Acute on chronic lower back pain MRI spine revealed new L4-L5 central disc protrusion compared to 01/15/2014 narrowing both lateral recesses, unchanged mild to moderate bilateral neuroforaminal stenosis at L5-S1 Started on gabapentin 400 mg 3 times daily and Solu-Cortef 50 mg 3 times daily Continue to monitor symptomatology Pain management in place with IV morphine 2 mg every 4 hours PRN for severe pain and Percocet 1 tablet every 4 hours as needed for moderate pain Senokot  tablet 4 constipation prophylaxis  Polyneuropathy secondary to disc protrusion Started gabapentin 40 mg 3 times daily  Chronic hypoxic respiratory failure O2 nasal cannula at baseline Maintain O2 saturation greater than 90% Continue Xopenex neb  OSA with noncompliance with CPAP Encourage compliance CPAP at night Refused to wear CPAP last night  History of PE on Eliquis Continue Eliquis  Severe morbid obesity BMI 58 Weight loss outpatient with regular physical activity and healthy dieting  Essential hypertension: -Coreg - IV hydralazine.  Chronic diastolic CHF (congestive heart failure) ([place]): 2D echo on 07/29/2018 showed EF 50-60% with grade 1 diastolic dysfunction.  Difficult to assess patient's volume status due to morbid obesity, but the patient states that she has not taken her Lasix for more than 3 weeks.  She has gained 15 pounds recently. Currently patient does not have worsening shortness of breath.  CHF seem to be compensated, but patient is at high risk of developing CHF exacerbation. -Hold Lasix due to sepsis -Check BNP -will need to resume Lasix as soon as her sepsis is controlled.  Risks: High risk for decompensation due to acute pyelonephritis, severe lower back pain requiring IV pain medications, multiple comorbidities and advanced age.  Patient will require least 2 midnights for further assessment and treatment of present condition.   DVT ppx: On Eliquis Code Status: Full code Family Communication: None at bed side.   Disposition Plan:  Anticipate discharge back to previous home environment Consults called: None     Objective: Vitals:   01/10/19 1727 01/10/19 2044 01/11/19 0345 01/11/19 0818  BP:  123/74 (!) 145/77 132/77  Pulse:  (!) 101 100 87  Resp:  18 20 18   Temp:  99.2  F (37.3 C) 99 F (37.2 C) 99.8 F (37.7 C)  TempSrc:  Oral Oral Oral  SpO2: 95% 93% 93% 93%  Weight:    (!) 158.7 kg    Intake/Output Summary (Last 24 hours) at 01/11/2019  1246 Last data filed at 01/11/2019 3244 Gross per 24 hour  Intake 678.55 ml  Output 850 ml  Net -171.45 ml   Filed Weights   01/11/19 0818  Weight: (!) 158.7 kg    Exam:   General: 73 y.o. year-old female morbidly obese in no acute distress.  On nasal cannula.  Alert and oriented x3.  Cardiovascular: Regular rate and rhythm with no rubs or gallops.  No JVD or thyromegaly noted.    Respiratory: Clear to Auscultation with No Wheezes or Rales.  Good Inspiratory Effort.  Abdomen: Soft nontender nondistended with normal bowel sounds x4 quadrants.    Musculoskeletal: Trace edema, morbidly obese, 2 out of 4 pulses in all 4 extremities.  Psychiatry: Mood is appropriate for condition and setting   Data Reviewed: CBC: Recent Labs  Lab 01/10/19 0059 01/10/19 0447  WBC 13.9* 13.0*  NEUTROABS 10.7*  --   HGB 13.9 13.2  HCT 46.7* 44.8  MCV 90.9 91.1  PLT 145* 010*   Basic Metabolic Panel: Recent Labs  Lab 01/09/19 2211 01/10/19 0447  NA 137 138  K 4.5 4.1  CL 102 101  CO2 22 26  GLUCOSE 125* 135*  BUN 25* 21  CREATININE 1.16* 1.06*  CALCIUM 9.4 9.2   GFR: Estimated Creatinine Clearance: 76.2 mL/min (A) (by C-G formula based on SCr of 1.06 mg/dL (H)). Liver Function Tests: Recent Labs  Lab 01/09/19 2211  AST 27  ALT 25  ALKPHOS 69  BILITOT 1.1  PROT 8.0  ALBUMIN 3.6   Recent Labs  Lab 01/10/19 0447  LIPASE 53*   No results for input(s): AMMONIA in the last 168 hours. Coagulation Profile: Recent Labs  Lab 01/10/19 0447  INR 1.1   Cardiac Enzymes: Recent Labs  Lab 01/10/19 1335  TROPONINI <0.03   BNP (last 3 results) No results for input(s): PROBNP in the last 8760 hours. HbA1C: No results for input(s): HGBA1C in the last 72 hours. CBG: No results for input(s): GLUCAP in the last 168 hours. Lipid Profile: No results for input(s): CHOL, HDL, LDLCALC, TRIG, CHOLHDL, LDLDIRECT in the last 72 hours. Thyroid Function Tests: No results for  input(s): TSH, T4TOTAL, FREET4, T3FREE, THYROIDAB in the last 72 hours. Anemia Panel: No results for input(s): VITAMINB12, FOLATE, FERRITIN, TIBC, IRON, RETICCTPCT in the last 72 hours. Urine analysis:    Component Value Date/Time   COLORURINE YELLOW 01/10/2019 0205   APPEARANCEUR HAZY (A) 01/10/2019 0205   LABSPEC 1.042 (H) 01/10/2019 0205   PHURINE 5.0 01/10/2019 0205   GLUCOSEU NEGATIVE 01/10/2019 0205   GLUCOSEU NEGATIVE 05/20/2017 1705   HGBUR SMALL (A) 01/10/2019 0205   BILIRUBINUR NEGATIVE 01/10/2019 0205   KETONESUR NEGATIVE 01/10/2019 0205   PROTEINUR NEGATIVE 01/10/2019 0205   UROBILINOGEN 0.2 05/20/2017 1705   NITRITE NEGATIVE 01/10/2019 0205   LEUKOCYTESUR LARGE (A) 01/10/2019 0205   Sepsis Labs: @LABRCNTIP (procalcitonin:4,lacticidven:4)  ) Recent Results (from the past 240 hour(s))  Blood Culture (routine x 2)     Status: None (Preliminary result)   Collection Time: 01/09/19 10:12 PM  Result Value Ref Range Status   Specimen Description BLOOD BLOOD LEFT FOREARM  Final   Special Requests   Final    BOTTLES DRAWN AEROBIC AND ANAEROBIC Blood Culture adequate  volume   Culture   Final    NO GROWTH < 12 HOURS Performed at [facility], [place] [address] [place], [place] [address]    Report Status PENDING  Incomplete  Blood Culture (routine x 2)     Status: None (Preliminary result)   Collection Time: 01/09/19 10:12 PM  Result Value Ref Range Status   Specimen Description BLOOD RIGHT ANTECUBITAL  Final   Special Requests   Final    BOTTLES DRAWN AEROBIC ONLY Blood Culture results may not be optimal due to an inadequate volume of blood received in culture bottles   Culture   Final    NO GROWTH < 12 HOURS Performed at [facility], [place] [address] [place], [place] [address]    Report Status PENDING  Incomplete  SARS Coronavirus 2 (CEPHEID - Performed in [facility]), Hosp Order     Status: None   Collection Time: 01/09/19 11:36 PM  Result  Value Ref Range Status   SARS Coronavirus 2 NEGATIVE NEGATIVE Final    Comment: (NOTE) If result is NEGATIVE SARS-CoV-2 target nucleic acids are NOT DETECTED. The SARS-CoV-2 RNA is generally detectable in upper and lower  respiratory specimens during the acute phase of infection. The lowest  concentration of SARS-CoV-2 viral copies this assay can detect is 250  copies / mL. A negative result does not preclude SARS-CoV-2 infection  and should not be used as the sole basis for treatment or other  patient management decisions.  A negative result may occur with  improper specimen collection / handling, submission of specimen other  than nasopharyngeal swab, presence of viral mutation(s) within the  areas targeted by this assay, and inadequate number of viral copies  (<250 copies / mL). A negative result must be combined with clinical  observations, patient history, and epidemiological information. If result is POSITIVE SARS-CoV-2 target nucleic acids are DETECTED. The SARS-CoV-2 RNA is generally detectable in upper and lower  respiratory specimens dur ing the acute phase of infection.  Positive  results are indicative of active infection with SARS-CoV-2.  Clinical  correlation with patient history and other diagnostic information is  necessary to determine patient infection status.  Positive results do  not rule out bacterial infection or co-infection with other viruses. If result is PRESUMPTIVE POSTIVE SARS-CoV-2 nucleic acids MAY BE PRESENT.   A presumptive positive result was obtained on the submitted specimen  and confirmed on repeat testing.  While 2019 novel coronavirus  (SARS-CoV-2) nucleic acids may be present in the submitted sample  additional confirmatory testing may be necessary for epidemiological  and / or clinical management purposes  to differentiate between  SARS-CoV-2 and other Sarbecovirus currently known to infect humans.  If clinically indicated additional testing  with an alternate test  methodology [phone]) is advised. The SARS-CoV-2 RNA is generally  detectable in upper and lower respiratory sp ecimens during the acute  phase of infection. The expected result is Negative. Fact Sheet for Patients:  StrictlyIdeas.no Fact Sheet for Healthcare Providers: BankingDealers.co.za This test is not yet approved or cleared by the [place] FDA and has been authorized for detection and/or diagnosis of SARS-CoV-2 by FDA under an Emergency Use Authorization (EUA).  This EUA will remain in effect (meaning this test can be used) for the duration of the COVID-19 declaration under Section 564(b)(1) of the Act, 21 U.S.C. section 360bbb-3(b)(1), unless the authorization is terminated or revoked sooner. Performed at [facility], [place]  [address], [place], [place] [address]   Urine culture     Status: Abnormal (Preliminary result)   Collection Time: 01/10/19  2:05 AM  Result Value Ref Range Status   Specimen Description URINE, CATHETERIZED  Final   Special Requests   Final    NONE Performed at [facility], [place] [address], [place], [place] [address]    Culture >=100,000 COLONIES/mL ESCHERICHIA COLI (A)  Final   Report Status PENDING  Incomplete      Studies: No results found.  Scheduled Meds:  apixaban  5 mg Oral BID   carvedilol  3.125 mg Oral BID WC   gabapentin  400 mg Oral TID   hydrocortisone sod succinate (SOLU-CORTEF) inj  50 mg Intravenous Q8H   mouth rinse  15 mL Mouth Rinse BID    Continuous Infusions:  cefTRIAXone (ROCEPHIN)  IV 1 g (01/10/19 2227)     LOS: 1 day     Kayleen Memos, MD [facility] Pager [phone]  If 7PM-7AM, please contact night-coverage [email or web address] Password [facility] 01/11/2019, 12:46 PM

## 2019-01-12 LAB — URINE CULTURE: Culture: >=100000 — AB

## 2019-01-12 LAB — BASIC METABOLIC PANEL
Anion gap: 9 (ref 5–15)
BUN: 18 mg/dL (ref 8–23)
CO2: 29 mmol/L (ref 22–32)
Calcium: 9.1 mg/dL (ref 8.9–10.3)
Chloride: 100 mmol/L (ref 98–111)
Creatinine, Ser: 0.91 mg/dL (ref 0.44–1.00)
GFR calc Af Amer: >60 mL/min (ref >60)
GFR calc non Af Amer: >60 mL/min (ref >60)
Glucose, Bld: 130 mg/dL — ABNORMAL HIGH (ref 70–99)
Potassium: 4.7 mmol/L (ref 3.5–5.1)
Sodium: 138 mmol/L (ref 135–145)

## 2019-01-12 LAB — CBC WITH DIFFERENTIAL/PLATELET
Abs Immature Granulocytes: 0.07 10*3/uL (ref 0.00–0.07)
Basophils Absolute: 0 10*3/uL (ref 0.0–0.1)
Basophils Relative: 0 %
Eosinophils Absolute: 0 10*3/uL (ref 0.0–0.5)
Eosinophils Relative: 0 %
HCT: 45.1 % (ref 36.0–46.0)
Hemoglobin: 13.3 g/dL (ref 12.0–15.0)
Immature Granulocytes: 1 %
Lymphocytes Relative: 12 %
Lymphs Abs: 1.4 10*3/uL (ref 0.7–4.0)
MCH: 27 pg (ref 26.0–34.0)
MCHC: 29.5 g/dL — ABNORMAL LOW (ref 30.0–36.0)
MCV: 91.5 fL (ref 80.0–100.0)
Monocytes Absolute: 0.8 10*3/uL (ref 0.1–1.0)
Monocytes Relative: 7 %
Neutro Abs: 9.1 10*3/uL — ABNORMAL HIGH (ref 1.7–7.7)
Neutrophils Relative %: 80 %
Platelets: 172 10*3/uL (ref 150–400)
RBC: 4.93 MIL/uL (ref 3.87–5.11)
RDW: 15.1 % (ref 11.5–15.5)
WBC: 11.5 10*3/uL — ABNORMAL HIGH (ref 4.0–10.5)
nRBC: 0 % (ref 0.0–0.2)

## 2019-01-12 MED ORDER — SULFAMETHOXAZOLE-TRIMETHOPRIM 800-160 MG PO TABS
1.0000 | ORAL_TABLET | Freq: Two times a day (BID) | ORAL | Status: DC
  Administered 2019-01-12 – 2019-01-15 (×6): 1 via ORAL
  Filled 2019-01-12 (×6): qty 1

## 2019-01-12 NOTE — Progress Notes (Signed)
PROGRESS NOTE  Janice Morrow VPX:[id number] DOB: 1946/04/05 DOA: 01/09/2019 PCP: Janith Lima, MD  HPI/Recap of past 24 hours: [name] a 73 y.o.femalewith medical history significant ofhypertension, hyperlipidemia, COPD on 4 L oxygen,dCHF, GERD, migraine, OSA on CPAP, morbid obesity, kidney stone, CKD 3, who presents with abdominal pain, back pain, fever, chills, dysuria.  Leukocytosis, positive UA and suspected left pyelonephritis on CT scan.  Admitted for acute pyelonephritis.  01/12/19: Patient seen and examined at her bedside.  Reports her back pain and lower extremities pain are improved with gabapentin and IV steroids.  Refused to wear CPAP overnight.   Assessment/Plan: Principal Problem:   Acute pyelonephritis Active Problems:   GERD   Chronic back pain   OSA on CPAP   COPD (chronic obstructive pulmonary disease) (HCC)   Essential hypertension   Chronic respiratory failure with hypoxia (HCC)   Sepsis (HCC)   Chronic diastolic CHF (congestive heart failure) (HCC)   Abdominal pain   PE (pulmonary thromboembolism) ([place])  Sepsis secondary to acute left pyelonephritis Presented with leukocytosis, positive UA and suspected left pyelonephritis CT scan, tachycardia and tachypnea. Urine culture greater than 100,000 colonies of E. Coli, pansensitive Blood cultures x2 negative to date Completed 2 days of Rocephin Switch to Bactrim on 01/12/2019 Afebrile Leukocytosis is resolving WBC 11 K  Acute on chronic lower back pain MRI spine revealed new L4-L5 central disc protrusion compared to 01/15/2014 narrowing both lateral recesses, unchanged mild to moderate bilateral neuroforaminal stenosis at L5-S1 Started on gabapentin 400 mg 3 times daily and Solu-Cortef 50 mg 3 times daily Continue to monitor symptomatology Pain management in place with IV morphine 2 mg every 4 hours PRN for severe pain and Percocet 1 tablet every 4 hours as needed for moderate pain Gabapentin  was added on 01/11/2019 400 mg 3 times daily Senokot tablet 4 constipation prophylaxis PT OT to assess  Polyneuropathy secondary to disc protrusion Continue gabapentin 40 mg 3 times daily  Chronic hypoxic respiratory failure O2 nasal cannula at baseline Maintain O2 saturation greater than 90% Continue Xopenex neb  OSA with noncompliance with CPAP Encourage compliance CPAP at night Refused to wear CPAP last night  History of PE on Eliquis Continue Eliquis  Severe morbid obesity BMI 58 Weight loss outpatient with regular physical activity and healthy dieting  Essential hypertension: -Coreg - IV hydralazine.  Chronic diastolic CHF (congestive heart failure) ([place]): 2D echo on 07/29/2018 showed EF 50-60% with grade 1 diastolic dysfunction.  Difficult to assess patient's volume status due to morbid obesity, but the patient states that she has not taken her Lasix for more than 3 weeks.  She has gained 15 pounds recently. Currently patient does not have worsening shortness of breath.  CHF seem to be compensated, but patient is at high risk of developing CHF exacerbation. -Hold Lasix due to sepsis -Check BNP -will need to resume Lasix as soon as her sepsis is controlled.  Physical debility/ambulatory dysfunction Uses a walker at baseline PT OT to assess Fall precautions   DVT ppx: On Eliquis Code Status: Full code Family Communication: None at bed side.   Disposition Plan:   Home versus SNF in 1 to 2 days. Consults called: None     Objective: Vitals:   01/11/19 0818 01/11/19 1652 01/11/19 2134 01/12/19 0840  BP: 132/77 (!) 147/83 (!) 154/99 (!) 149/81  Pulse: 87 93 99 85  Resp: 18  18 18   Temp: 99.8 F (37.7 C) 98.9 F (37.2 C) 99.1  F (37.3 C) 99 F (37.2 C)  TempSrc: Oral Oral Oral Oral  SpO2: 93% 100% 92% 93%  Weight: (!) 158.7 kg       Intake/Output Summary (Last 24 hours) at 01/12/2019 1334 Last data filed at 01/12/2019 1100 Gross per 24 hour  Intake 445 ml   Output 500 ml  Net -55 ml   Filed Weights   01/11/19 0818  Weight: (!) 158.7 kg    Exam:  . General: 73 y.o. year-old female morbidly obese in no acute distress.  Alert oriented x3.   . Cardiovascular: Regular rate and rhythm with no rubs or gallops.  No JVD or thyromegaly. [name] Respiratory: Clear to auscultation with no wheezes or RALES.  Poor inspiratory effort.   . Abdomen: Morbidly obese with no tenderness on palpation.  Musculoskeletal: Trace edema in lower extremities bilaterally. [name] Psychiatry: Mood is appropriate for condition and setting.   Data Reviewed: CBC: Recent Labs  Lab 01/10/19 0059 01/10/19 0447 01/12/19 0311  WBC 13.9* 13.0* 11.5*  NEUTROABS 10.7*  --  9.1*  HGB 13.9 13.2 13.3  HCT 46.7* 44.8 45.1  MCV 90.9 91.1 91.5  PLT 145* 149* 749   Basic Metabolic Panel: Recent Labs  Lab 01/09/19 2211 01/10/19 0447 01/12/19 0311  NA 137 138 138  K 4.5 4.1 4.7  CL 102 101 100  CO2 22 26 29   GLUCOSE 125* 135* 130*  BUN 25* 21 18  CREATININE 1.16* 1.06* 0.91  CALCIUM 9.4 9.2 9.1   GFR: Estimated Creatinine Clearance: 88.7 mL/min (by C-G formula based on SCr of 0.91 mg/dL). Liver Function Tests: Recent Labs  Lab 01/09/19 2211  AST 27  ALT 25  ALKPHOS 69  BILITOT 1.1  PROT 8.0  ALBUMIN 3.6   Recent Labs  Lab 01/10/19 0447  LIPASE 53*   No results for input(s): AMMONIA in the last 168 hours. Coagulation Profile: Recent Labs  Lab 01/10/19 0447  INR 1.1   Cardiac Enzymes: Recent Labs  Lab 01/10/19 1335  TROPONINI <0.03   BNP (last 3 results) No results for input(s): PROBNP in the last 8760 hours. HbA1C: No results for input(s): HGBA1C in the last 72 hours. CBG: No results for input(s): GLUCAP in the last 168 hours. Lipid Profile: No results for input(s): CHOL, HDL, LDLCALC, TRIG, CHOLHDL, LDLDIRECT in the last 72 hours. Thyroid Function Tests: No results for input(s): TSH, T4TOTAL, FREET4, T3FREE, THYROIDAB in the last 72 hours.  Anemia Panel: No results for input(s): VITAMINB12, FOLATE, FERRITIN, TIBC, IRON, RETICCTPCT in the last 72 hours. Urine analysis:    Component Value Date/Time   COLORURINE YELLOW 01/10/2019 0205   APPEARANCEUR HAZY (A) 01/10/2019 0205   LABSPEC 1.042 (H) 01/10/2019 0205   PHURINE 5.0 01/10/2019 0205   GLUCOSEU NEGATIVE 01/10/2019 0205   GLUCOSEU NEGATIVE 05/20/2017 1705   HGBUR SMALL (A) 01/10/2019 0205   BILIRUBINUR NEGATIVE 01/10/2019 0205   KETONESUR NEGATIVE 01/10/2019 0205   PROTEINUR NEGATIVE 01/10/2019 0205   UROBILINOGEN 0.2 05/20/2017 1705   NITRITE NEGATIVE 01/10/2019 0205   LEUKOCYTESUR LARGE (A) 01/10/2019 0205   Sepsis Labs: @LABRCNTIP (procalcitonin:4,lacticidven:4)  ) Recent Results (from the past 240 hour(s))  Blood Culture (routine x 2)     Status: None (Preliminary result)   Collection Time: 01/09/19 10:12 PM  Result Value Ref Range Status   Specimen Description BLOOD BLOOD LEFT FOREARM  Final   Special Requests   Final    BOTTLES DRAWN AEROBIC AND ANAEROBIC Blood Culture adequate volume  Culture   Final    NO GROWTH 3 DAYS Performed at [facility], [place] [address], [place], [place] [address]    Report Status PENDING  Incomplete  Blood Culture (routine x 2)     Status: None (Preliminary result)   Collection Time: 01/09/19 10:12 PM  Result Value Ref Range Status   Specimen Description BLOOD RIGHT ANTECUBITAL  Final   Special Requests   Final    BOTTLES DRAWN AEROBIC ONLY Blood Culture results may not be optimal due to an inadequate volume of blood received in culture bottles   Culture   Final    NO GROWTH 3 DAYS Performed at [facility], [place] [address], [place], [place] [address]    Report Status PENDING  Incomplete  SARS Coronavirus 2 (CEPHEID - Performed in [facility]), Hosp Order     Status: None   Collection Time: 01/09/19 11:36 PM  Result Value Ref Range Status   SARS Coronavirus 2 NEGATIVE NEGATIVE Final    Comment:  (NOTE) If result is NEGATIVE SARS-CoV-2 target nucleic acids are NOT DETECTED. The SARS-CoV-2 RNA is generally detectable in upper and lower  respiratory specimens during the acute phase of infection. The lowest  concentration of SARS-CoV-2 viral copies this assay can detect is 250  copies / mL. A negative result does not preclude SARS-CoV-2 infection  and should not be used as the sole basis for treatment or other  patient management decisions.  A negative result may occur with  improper specimen collection / handling, submission of specimen other  than nasopharyngeal swab, presence of viral mutation(s) within the  areas targeted by this assay, and inadequate number of viral copies  (<250 copies / mL). A negative result must be combined with clinical  observations, patient history, and epidemiological information. If result is POSITIVE SARS-CoV-2 target nucleic acids are DETECTED. The SARS-CoV-2 RNA is generally detectable in upper and lower  respiratory specimens dur ing the acute phase of infection.  Positive  results are indicative of active infection with SARS-CoV-2.  Clinical  correlation with patient history and other diagnostic information is  necessary to determine patient infection status.  Positive results do  not rule out bacterial infection or co-infection with other viruses. If result is PRESUMPTIVE POSTIVE SARS-CoV-2 nucleic acids MAY BE PRESENT.   A presumptive positive result was obtained on the submitted specimen  and confirmed on repeat testing.  While 2019 novel coronavirus  (SARS-CoV-2) nucleic acids may be present in the submitted sample  additional confirmatory testing may be necessary for epidemiological  and / or clinical management purposes  to differentiate between  SARS-CoV-2 and other Sarbecovirus currently known to infect humans.  If clinically indicated additional testing with an alternate test  methodology [phone]) is advised. The SARS-CoV-2 RNA is  generally  detectable in upper and lower respiratory sp ecimens during the acute  phase of infection. The expected result is Negative. Fact Sheet for Patients:  StrictlyIdeas.no Fact Sheet for Healthcare Providers: BankingDealers.co.za This test is not yet approved or cleared by the [place] FDA and has been authorized for detection and/or diagnosis of SARS-CoV-2 by FDA under an Emergency Use Authorization (EUA).  This EUA will remain in effect (meaning this test can be used) for the duration of the COVID-19 declaration under Section 564(b)(1) of the Act, 21 U.S.C. section 360bbb-3(b)(1), unless the authorization is terminated or revoked sooner. Performed at [facility], [place] [address], [place], [place] [address]  Urine culture     Status: Abnormal   Collection Time: 01/10/19  2:05 AM  Result Value Ref Range Status   Specimen Description URINE, CATHETERIZED  Final   Special Requests   Final    NONE Performed at [facility], [address], [place], [place] [address]    Culture >=100,000 COLONIES/mL ESCHERICHIA COLI (A)  Final   Report Status 01/12/2019 FINAL  Final   Organism ID, Bacteria ESCHERICHIA COLI (A)  Final      Susceptibility   Escherichia coli - MIC*    AMPICILLIN 8 SENSITIVE Sensitive     CEFAZOLIN <=4 SENSITIVE Sensitive     CEFTRIAXONE <=1 SENSITIVE Sensitive     CIPROFLOXACIN <=0.25 SENSITIVE Sensitive     GENTAMICIN <=1 SENSITIVE Sensitive     IMIPENEM <=0.25 SENSITIVE Sensitive     NITROFURANTOIN 32 SENSITIVE Sensitive     TRIMETH/SULFA <=20 SENSITIVE Sensitive     AMPICILLIN/SULBACTAM 4 SENSITIVE Sensitive     PIP/TAZO <=4 SENSITIVE Sensitive     Extended ESBL NEGATIVE Sensitive     * >=100,000 COLONIES/mL ESCHERICHIA COLI      Studies: No results found.  Scheduled Meds: . apixaban  5 mg Oral BID  . carvedilol  3.125 mg Oral BID WC  . gabapentin  400 mg Oral TID  . hydrocortisone  sod succinate (SOLU-CORTEF) inj  50 mg Intravenous Q8H  . mouth rinse  15 mL Mouth Rinse BID  . senna-docusate  2 tablet Oral BID  . sulfamethoxazole-trimethoprim  1 tablet Oral Q12H    Continuous Infusions:    LOS: 2 days     Kayleen Memos, MD [facility] Pager [phone]  If 7PM-7AM, please contact night-coverage [email or web address] Password [email or web address] 01/12/2019, 1:34 PM

## 2019-01-12 NOTE — Evaluation (Signed)
Physical Therapy Evaluation Patient Details Name: CECYLIA BRAZILL MRN: [id number] DOB: [birth year] Today's Date: 01/12/2019   History of Present Illness  AYELET GRUENEWALD is a 73 y.o. female with medical history significant of hypertension, hyperlipidemia, COPD on 4 L oxygen, dCHF, GERD, migraine, OSA on CPAP, morbid obesity, kidney stone, CKD 3, who presents with abdominal pain, back pain, fever, chills, dysuria.  Leukocytosis, positive UA and suspected left pyelonephritis on CT scan.  Admitted for acute pyelonephritis.  Clinical Impression   Pt admitted with above diagnosis. Pt currently with functional limitations due to the deficits listed below (see PT Problem List). Unsure of PLOF, as Ms. Dunk was unable to convey to me during PT eval; Per chart review, as of December of last year, she was independent with assistive devices, and occasionally used an office chair to move around in her home; Presents with profoundly decr functional mobility and decr activity tolerance; REcommend post-acute rehab to maximize independence and safety with mobility;  Pt will benefit from skilled PT to increase their independence and safety with mobility to allow discharge to the venue listed below.       Follow Up Recommendations SNF;Supervision/Assistance - 24 hour    Equipment Recommendations  Rolling walker with 5" wheels;3in1 (PT);Wheelchair (measurements PT);Other (comment)(Bariatric)    Recommendations for Other Services       Precautions / Restrictions Precautions Precautions: Fall      Mobility  Bed Mobility Overal bed mobility: Needs Assistance Bed Mobility: Rolling Rolling: +2 for physical assistance;Max assist         General bed mobility comments: Rolled R and L with Max assist fo 2 people; cues for reaching across body for rail  Transfers                    Ambulation/Gait                Stairs            Wheelchair Mobility    Modified Rankin (Stroke  Patients Only)       Balance                                             Pertinent Vitals/Pain Pain Assessment: Faces Faces Pain Scale: Hurts whole lot Pain Location: Back pain and generalized pain with movement Pain Descriptors / Indicators: Grimacing;Crying Pain Intervention(s): Monitored during session;Repositioned    Home Living Family/patient expects to be discharged to:: Private residence Living Arrangements: Other relatives Available Help at Discharge: Family;Available 24 hours/day(brother lives with pt) Type of Home: Apartment Home Access: Level entry     Home Layout: One level   Additional Comments: Pt in a lot of pain during PT eval on 01/12/19; very internally distracted, and unable to answer questions re: home setup, available assist, and PLOF; Much of above was gleaned from chart review; Ms. Skorupski did verify that her brother does the grocery shopping    Prior Function           Comments: Difficult discerning prior level of function     Hand Dominance   Dominant Hand: Right    Extremity/Trunk Assessment   Upper Extremity Assessment Upper Extremity Assessment: Generalized weakness(and movement grossly limited by body habitus)    Lower Extremity Assessment Lower Extremity Assessment: Generalized weakness(and movement grossly limited by body habitus)  Communication   Communication: Other (comment)(Very internally distracted by pain)  Cognition Arousal/Alertness: Awake/alert Behavior During Therapy: WFL for tasks assessed/performed;Anxious Overall Cognitive Status: Difficult to assess                                 General Comments: Very internally distracted by pain, and having difficulty answering questions re: PLOF      General Comments      Exercises     Assessment/Plan    PT Assessment Patient needs continued PT services  PT Problem List Decreased strength;Decreased range of motion;Decreased  activity tolerance;Decreased balance;Decreased mobility;Decreased coordination;Decreased cognition;Decreased knowledge of use of DME;Decreased safety awareness;Decreased knowledge of precautions;Pain       PT Treatment Interventions Functional mobility training;Therapeutic activities;Therapeutic exercise;Balance training;Cognitive remediation;Patient/family education    PT Goals (Current goals can be found in the Care Plan section)  Acute Rehab PT Goals Patient Stated Goal: Did not state PT Goal Formulation: Patient unable to participate in goal setting Time For Goal Achievement: 01/26/19 Potential to Achieve Goals: Fair    Frequency Min 2X/week   Barriers to discharge Other (comment) Need more information re: available home assist    Co-evaluation               AM-PAC PT "6 Clicks" Mobility  Outcome Measure Help needed turning from your back to your side while in a flat bed without using bedrails?: A Lot Help needed moving from lying on your back to sitting on the side of a flat bed without using bedrails?: A Lot Help needed moving to and from a bed to a chair (including a wheelchair)?: Total Help needed standing up from a chair using your arms (e.g., wheelchair or bedside chair)?: Total Help needed to walk in hospital room?: Total Help needed climbing 3-5 steps with a railing? : Total 6 Click Score: 8    End of Session   Activity Tolerance: Patient limited by pain Patient left: in bed;with call bell/phone within reach;with nursing/sitter in room;Other (comment)(bed in chair position) Nurse Communication: Mobility status;Need for lift equipment PT Visit Diagnosis: Other abnormalities of gait and mobility (R26.89);Muscle weakness (generalized) (M62.81);Pain Pain - Right/Left: (back) Pain - part of body: (back)    Time: 1351-1404 PT Time Calculation (min) (ACUTE ONLY): 13 min   Charges:   PT Evaluation $PT Eval Moderate Complexity: [place], PT  Acute Rehabilitation Services Pager [phone] Office [place] 01/12/2019, 2:26 PM

## 2019-01-13 LAB — GLUCOSE, CAPILLARY: Glucose-Capillary: 151 mg/dL — ABNORMAL HIGH (ref 70–99)

## 2019-01-13 MED ORDER — FUROSEMIDE 40 MG PO TABS
40.0000 mg | ORAL_TABLET | Freq: Every day | ORAL | Status: DC
  Administered 2019-01-13 – 2019-01-15 (×3): 40 mg via ORAL
  Filled 2019-01-13 (×3): qty 1

## 2019-01-13 NOTE — Progress Notes (Signed)
Pt refusing cpap for the night. ?

## 2019-01-13 NOTE — TOC Initial Note (Addendum)
Transition of Care [facility]) - Initial/Assessment Note    Patient Details  Name: Janice Morrow MRN: [id number] Date of Birth: November 01, 1945  [facility]) CM/SW Contact:    Janice Feil, LCSW Phone Number: 01/13/2019, 5:56 PM  Clinical Narrative: Initially talked with brother as patient was asleep and per chart, patient oriented only to person, place and time. Janice Morrow was in agreement with ST rehab and provided history regarding patient physical and medical decline and increasing inability to ambulate and take care of herself. Per brother, he and his sister live at the [facility] (have been there since December 2019), and his sister has declined in her ability to care for herself. Janice Morrow reported that before coming to the hospital this time, she went to the bathroom and he woke up during the night and she was still in the bathroom. She finally consented to an ambulance being called and per brother, it took 5 people to get her out the bathroom and on the stretcher. Janice Morrow added that later he went to clean her side of the bed and found many soiled adult diapers.  Brother concerned that his sister does not eat right-"eats to much". He added that she does [name] for herself, but lately he has had to warm her food and make sandwiches for her. When asked, brother responded that his sister uses a walker and cane. Janice Morrow reported that he gets their groceries and he banks for her. He added that she had been cooking for herself. Brother added that his sister does not leave the room and had not been out of the room for 3 months prior to coming to the hospital this time. CSW informed that Janice Morrow had a daughter that was adopted by their sister Janice Morrow.  CSW visited room and talked with patient and was informed of purpose for visit. Janice Morrow was advised that brother had been contacted regarding recommendation of SNF and was in agreement. Patient expressed that she did not mind that her  brother had been contacted and strongly conveyed that she was not going to a nursing home. CSW explained the benefits of rehab and that it was temporary, however patient continued to adamantly refuse, stating that she was not going to a nursing home and was going home. Patient reported that she can take care of herself and walks with her can or walker.  MD contacted (text page) and informed of patient's decision to discharge home. Nurse case manager informed and initiated process for patient to receive [facility] through [facility]. Patient's brother also contacted and informed of patient's decision to discharge home.    Expected Discharge Plan: [place] - changed to home. Barriers to Discharge: SNF Pending bed offer   Patient Goals and CMS Choice Patient states their goals for this hospitalization and ongoing recovery are:: Janice Morrow reported that she had a UTI and is now better and can go home and take care of herself CMS Medicare.gov Compare Post Acute Care list provided to:: (Not provided as patient refused SNF) Choice offered to / list presented to : NA(Patient refused SNF)  Expected Discharge Plan and Services Expected Discharge Plan: [name] In-house Referral: Clinical Social Work Discharge Planning Services: CM Consult Post Acute Care Choice: [place] arrangements for the past 2 months: Hotel/Motel([facility] [place])  HH Arranged: RN, Refused SNF, PT, OT(Patient will also get a SW and Aide) [place]: [place] Date [place]: 01/13/19 Time [place]: [place] Representative spoke with at [place] Arrangements/Services Living arrangements for the past 2 months: Hotel/Motel([facility] [place]) Lives with:: Siblings(Brother Janice Morrow) Patient language and need for interpreter reviewed:: No Do you feel safe going back to the place where you live?: Yes   Per brother,  patient needs rehab and skilled nursing care  Need for Family Participation in Patient Care: Yes (Comment) Care giver support system in place?: No (comment)(Brother reports that he can no longer assist patient physically due to his health issues. Brother does assist by shopping and banking for patient.) Current home services: Other (comment)(Patient did not receive any HH services prior to admission ) Criminal Activity/Legal Involvement Pertinent to Current Situation/Hospitalization: No - Comment as needed  Activities of Daily Living      Permission Sought/Granted Permission sought to share information with : Family Supports(Brother Janice Morrow) Permission granted to share information with : Yes, Verbal Permission Granted(Patient advised that brother had been called before coming to see her and why and patient expressed no concern regarding this)  Share Information with NAME: Janice Morrow     Permission granted to share info w Relationship: Brother  Permission granted to share info w Contact Information: [phone]  Emotional Assessment Appearance:: Appears younger than stated age Attitude/Demeanor/Rapport: Reactive, Engaged(Patient strongly reacted to recommendation of ST and brother's agreement that SNF for ST rehab needed) Affect (typically observed): Appropriate, Pleasant, Other (comment), Defensive(Initially patient was angry and defensive regarding brother's agreement that SNF needed and this emotion was appropriate for the situatuion. As patient and CSW continued to talk, she was pleasant and open.) Orientation: : Oriented to Self, Oriented to Situation, Oriented to Place, Oriented to  Time Alcohol / Substance Use: Tobacco Use, Alcohol Use, Illicit Drugs(Patient quit smoking, and does not drink or use illicit drugs ) Psych Involvement: No (comment)  Admission diagnosis:  UTI Patient Active Problem List   Diagnosis Date Noted  . Acute pyelonephritis 01/10/2019  . Sepsis ([place])  01/10/2019  . Chronic diastolic CHF (congestive heart failure) ([place]) 01/10/2019  . Abdominal pain 01/10/2019  . PE (pulmonary thromboembolism) ([place]) 01/10/2019  . Mild renal insufficiency 07/28/2018  . Chronic respiratory failure with hypoxia ([place])   . Insomnia secondary to chronic pain 02/03/2018  . Cervical disc disease with myelopathy 01/28/2018  . Primary osteoarthritis of both knees 10/14/2017  . Type 2 diabetes mellitus with complication, without long-term current use of insulin ([place]) 05/20/2017  . Kidney stone on left side   . COPD (chronic obstructive pulmonary disease) ([place]) 08/24/2015  . Essential hypertension 08/24/2015  . Seizures ([place]) 08/24/2015  . Obesity hypoventilation syndrome ([place]) 08/24/2015  . Migraine without aura and with status migrainosus, not intractable 07/12/2015  . OSA on CPAP 07/12/2015  . Left ventricular diastolic dysfunction, NYHA class 1 04/11/2015  . Visit for screening mammogram 03/08/2015  . Trigeminal neuralgia of right side of face 11/23/2014  . Chronic back pain 11/26/2013  . Hyperlipidemia with target LDL less than 130 09/01/2010  . Severe obesity (BMI >= 40) ([place]) 09/01/2010  . GERD 09/01/2010   PCP:  Janith Lima, MD Pharmacy:   [id number] [place], [address] [place] [address] Phone: [phone] Fax: [phone]  [facility], [address] [place]  [place] [address] Phone: [phone] Fax: [phone]  [id number] - [place], [address] [place] [address] Phone: [phone] Fax: [phone]     Social Determinants of Health (SDOH) Interventions  Per brother, patient is physically inactive and mainly stays in bed and does not leave their hotel room.   Readmission Risk Interventions No flowsheet data found.

## 2019-01-13 NOTE — TOC Progression Note (Signed)
Transition of Care [facility]) - Progression Note    Patient Details  Name: Janice Morrow MRN: [id number] Date of Birth: 1946/04/21  Transition of Care [facility]) CM/SW Contact  Bartholomew Crews, RN Phone Number: [phone] 01/13/2019, 5:11 PM  Clinical Narrative:    Spoke with CSW about patient adamantly declining SNF. Referral made to [place] program. Will need [place] orders for RN, PT, OT with face to face. CM to follow for transition of care needs.   Expected Discharge Plan: Skilled Nursing Facility Barriers to Discharge: Continued Medical Work up(Per MD, SNF possibly 5./20)  Expected Discharge Plan and Services Expected Discharge Plan: [place] In-house Referral: Clinical Social Work Discharge Planning Services: NA Post Acute Care Choice: [place] Living arrangements for the past 2 months: Hotel/Motel([facility] [place])                                       Social Determinants of Health (SDOH) Interventions    Readmission Risk Interventions No flowsheet data found.

## 2019-01-13 NOTE — Progress Notes (Signed)
PROGRESS NOTE  Janice Morrow LOV:[id number] DOB: [birth year] DOA: 01/09/2019 PCP: Janith Lima, MD  HPI/Recap of past 24 hours: Janice Morrow a 73 y.o.femalewith medical history significant ofhypertension, hyperlipidemia, COPD on 4 L oxygen,dCHF, GERD, migraine, OSA on CPAP, morbid obesity, kidney stone, CKD 3, who presents with abdominal pain, back pain, fever, chills, dysuria.  Leukocytosis, positive UA and suspected left pyelonephritis on CT scan.  Admitted for acute pyelonephritis.  01/13/19: Patient seen and examined at bedside.  She states her back pain is better with current pain management.  She is on gabapentin and Solu-Cortef.  PT assessed and recommended SNF with 24 hours assistance.  CSW consulted to assist with bed placement.   Assessment/Plan: Principal Problem:   Acute pyelonephritis Active Problems:   GERD   Chronic back pain   OSA on CPAP   COPD (chronic obstructive pulmonary disease) (HCC)   Essential hypertension   Chronic respiratory failure with hypoxia (HCC)   Sepsis (HCC)   Chronic diastolic CHF (congestive heart failure) (HCC)   Abdominal pain   PE (pulmonary thromboembolism) ([place])  Resolving sepsis secondary to acute left pyelonephritis Presented with leukocytosis WBC 13.9 K, tachycardia, tachypnea, positive UA and suspicion for left pyelonephritis on CT scan. Urine culture greater than 100,000 colonies of E. Coli, pansensitive Blood cultures x2 negative to date Completed 2 days of Rocephin Switch to Bactrim on 01/12/2019 Afebrile. Leukocytosis is resolving WBC 11 K Repeat CBC in the morning  Acute on chronic lower back pain MRI spine revealed new L4-L5 central disc protrusion compared to 01/15/2014 narrowing both lateral recesses, unchanged mild to moderate bilateral neuroforaminal stenosis at L5-S1 Symptoms improved on gabapentin 400 mg 3 times daily and Solu-Cortef 50 mg 3 times daily Continue to monitor symptomatology PT assessed and  recommended SNF [place] precautions  Polyneuropathy secondary to disc protrusion Continue gabapentin 400 mg 3 times daily  Chronic hypoxic respiratory failure on 4L O2 [place] at baseline Maintain O2 saturation greater than 90% Continue Xopenex neb  OSA with noncompliance with CPAP Encourage compliance CPAP at night Refused to wear CPAP last night  History of PE on Eliquis Continue Eliquis  Severe morbid obesity BMI 58 Weight loss outpatient with regular physical activity and healthy dieting  Essential hypertension: Blood pressure is normotensive -Coreg - IV hydralazine PRN.  Chronic diastolic CHF (congestive heart failure) ([place]): 2D echo on 07/29/2018 showed EF 50-60% with grade 1 diastolic dysfunction.  Difficult to assess patient's volume status due to morbid obesity, but the patient states that she has not taken her Lasix for more than 3 weeks.  She has gained 15 pounds recently. Currently patient does not have worsening shortness of breath.  CHF seem to be compensated, but patient is at high risk of developing CHF exacerbation. BNP 97 Resume home dose Lasix  Physical debility/ambulatory dysfunction Uses a walker at baseline PT OT recommended SNF CSW consulted to assist with bed placement Fall precautions   DVT ppx: On Eliquis Code Status: Full code Family Communication: None at bed side.   Disposition Plan:   SNF possibly tomorrow when bed is available Consults called: None     Objective: Vitals:   01/12/19 2201 01/13/19 0433 01/13/19 0535 01/13/19 0835  BP: 105/66 121/75  121/69  Pulse: (!) 102 (!) 107 (!) 101 94  Resp: 20 19    Temp: 98.3 F (36.8 C) 98.5 F (36.9 C)  99.2 F (37.3 C)  TempSrc: Oral Oral  Oral  SpO2: 92% (!) 86% 95%  100%  Weight: (!) 157.1 kg       Intake/Output Summary (Last 24 hours) at 01/13/2019 1215 Last data filed at 01/13/2019 0500 Gross per 24 hour  Intake 720 ml  Output 1900 ml  Net -1180 ml   Filed Weights   01/11/19 0818  01/12/19 2201  Weight: (!) 158.7 kg (!) 157.1 kg    Exam:  . General: 73 y.o. year-old female morbidly obese in no acute distress.  Alert and oriented x3.   . Cardiovascular: Regular rate and rhythm with no rubs or gallops.  No JVD or thyromegaly noted.   [name] Respiratory: Clear to auscultation with no wheezes or rales.  Poor inspiratory effort. . Abdomen: Morbidly obese with no tenderness on palpation.  Normal bowel sounds x4 quadrants.  .  Musculoskeletal: Trace edema in lower extremities bilaterally. [name] Psychiatry: Mood is appropriate for condition and setting.   Data Reviewed: CBC: Recent Labs  Lab 01/10/19 0059 01/10/19 0447 01/12/19 0311  WBC 13.9* 13.0* 11.5*  NEUTROABS 10.7*  --  9.1*  HGB 13.9 13.2 13.3  HCT 46.7* 44.8 45.1  MCV 90.9 91.1 91.5  PLT 145* 149* 233   Basic Metabolic Panel: Recent Labs  Lab 01/09/19 2211 01/10/19 0447 01/12/19 0311  NA 137 138 138  K 4.5 4.1 4.7  CL 102 101 100  CO2 22 26 29   GLUCOSE 125* 135* 130*  BUN 25* 21 18  CREATININE 1.16* 1.06* 0.91  CALCIUM 9.4 9.2 9.1   GFR: Estimated Creatinine Clearance: 88.1 mL/min (by C-G formula based on SCr of 0.91 mg/dL). Liver Function Tests: Recent Labs  Lab 01/09/19 2211  AST 27  ALT 25  ALKPHOS 69  BILITOT 1.1  PROT 8.0  ALBUMIN 3.6   Recent Labs  Lab 01/10/19 0447  LIPASE 53*   No results for input(s): AMMONIA in the last 168 hours. Coagulation Profile: Recent Labs  Lab 01/10/19 0447  INR 1.1   Cardiac Enzymes: Recent Labs  Lab 01/10/19 1335  TROPONINI <0.03   BNP (last 3 results) No results for input(s): PROBNP in the last 8760 hours. HbA1C: No results for input(s): HGBA1C in the last 72 hours. CBG: Recent Labs  Lab 01/13/19 0724  GLUCAP 151*   Lipid Profile: No results for input(s): CHOL, HDL, LDLCALC, TRIG, CHOLHDL, LDLDIRECT in the last 72 hours. Thyroid Function Tests: No results for input(s): TSH, T4TOTAL, FREET4, T3FREE, THYROIDAB in the last 72  hours. Anemia Panel: No results for input(s): VITAMINB12, FOLATE, FERRITIN, TIBC, IRON, RETICCTPCT in the last 72 hours. Urine analysis:    Component Value Date/Time   COLORURINE YELLOW 01/10/2019 0205   APPEARANCEUR HAZY (A) 01/10/2019 0205   LABSPEC 1.042 (H) 01/10/2019 0205   PHURINE 5.0 01/10/2019 0205   GLUCOSEU NEGATIVE 01/10/2019 0205   GLUCOSEU NEGATIVE 05/20/2017 1705   HGBUR SMALL (A) 01/10/2019 0205   BILIRUBINUR NEGATIVE 01/10/2019 0205   KETONESUR NEGATIVE 01/10/2019 0205   PROTEINUR NEGATIVE 01/10/2019 0205   UROBILINOGEN 0.2 05/20/2017 1705   NITRITE NEGATIVE 01/10/2019 0205   LEUKOCYTESUR LARGE (A) 01/10/2019 0205   Sepsis Labs: @LABRCNTIP (procalcitonin:4,lacticidven:4)  ) Recent Results (from the past 240 hour(s))  Blood Culture (routine x 2)     Status: None (Preliminary result)   Collection Time: 01/09/19 10:12 PM  Result Value Ref Range Status   Specimen Description BLOOD BLOOD LEFT FOREARM  Final   Special Requests   Final    BOTTLES DRAWN AEROBIC AND ANAEROBIC Blood Culture adequate volume   Culture  Final    NO GROWTH 4 DAYS Performed at [facility], [place] [address] [place], [place] [address]    Report Status PENDING  Incomplete  Blood Culture (routine x 2)     Status: None (Preliminary result)   Collection Time: 01/09/19 10:12 PM  Result Value Ref Range Status   Specimen Description BLOOD RIGHT ANTECUBITAL  Final   Special Requests   Final    BOTTLES DRAWN AEROBIC ONLY Blood Culture results may not be optimal due to an inadequate volume of blood received in culture bottles   Culture   Final    NO GROWTH 4 DAYS Performed at [facility], [place] [address], [place], [place] [address]    Report Status PENDING  Incomplete  SARS Coronavirus 2 (CEPHEID - Performed in [place] hospital lab), Hosp Order     Status: None   Collection Time: 01/09/19 11:36 PM  Result Value Ref Range Status   SARS Coronavirus 2 NEGATIVE NEGATIVE Final     Comment: (NOTE) If result is NEGATIVE SARS-CoV-2 target nucleic acids are NOT DETECTED. The SARS-CoV-2 RNA is generally detectable in upper and lower  respiratory specimens during the acute phase of infection. The lowest  concentration of SARS-CoV-2 viral copies this assay can detect is 250  copies / mL. A negative result does not preclude SARS-CoV-2 infection  and should not be used as the sole basis for treatment or other  patient management decisions.  A negative result may occur with  improper specimen collection / handling, submission of specimen other  than nasopharyngeal swab, presence of viral mutation(s) within the  areas targeted by this assay, and inadequate number of viral copies  (<250 copies / mL). A negative result must be combined with clinical  observations, patient history, and epidemiological information. If result is POSITIVE SARS-CoV-2 target nucleic acids are DETECTED. The SARS-CoV-2 RNA is generally detectable in upper and lower  respiratory specimens dur ing the acute phase of infection.  Positive  results are indicative of active infection with SARS-CoV-2.  Clinical  correlation with patient history and other diagnostic information is  necessary to determine patient infection status.  Positive results do  not rule out bacterial infection or co-infection with other viruses. If result is PRESUMPTIVE POSTIVE SARS-CoV-2 nucleic acids MAY BE PRESENT.   A presumptive positive result was obtained on the submitted specimen  and confirmed on repeat testing.  While 2019 novel coronavirus  (SARS-CoV-2) nucleic acids may be present in the submitted sample  additional confirmatory testing may be necessary for epidemiological  and / or clinical management purposes  to differentiate between  SARS-CoV-2 and other Sarbecovirus currently known to infect humans.  If clinically indicated additional testing with an alternate test  methodology [phone]) is advised. The SARS-CoV-2  RNA is generally  detectable in upper and lower respiratory sp ecimens during the acute  phase of infection. The expected result is Negative. Fact Sheet for Patients:  StrictlyIdeas.no Fact Sheet for Healthcare Providers: BankingDealers.co.za This test is not yet approved or cleared by the [place] FDA and has been authorized for detection and/or diagnosis of SARS-CoV-2 by FDA under an Emergency Use Authorization (EUA).  This EUA will remain in effect (meaning this test can be used) for the duration of the COVID-19 declaration under Section 564(b)(1) of the Act, 21 U.S.C. section 360bbb-3(b)(1), unless the authorization is terminated or revoked sooner. Performed at [facility], [place] [address], [place], [place] [address]   Urine  culture     Status: Abnormal   Collection Time: 01/10/19  2:05 AM  Result Value Ref Range Status   Specimen Description URINE, CATHETERIZED  Final   Special Requests   Final    NONE Performed at [facility], [address] [place],  [address]    Culture >=100,000 COLONIES/mL ESCHERICHIA COLI (A)  Final   Report Status 01/12/2019 FINAL  Final   Organism ID, Bacteria ESCHERICHIA COLI (A)  Final      Susceptibility   Escherichia coli - MIC*    AMPICILLIN 8 SENSITIVE Sensitive     CEFAZOLIN <=4 SENSITIVE Sensitive     CEFTRIAXONE <=1 SENSITIVE Sensitive     CIPROFLOXACIN <=0.25 SENSITIVE Sensitive     GENTAMICIN <=1 SENSITIVE Sensitive     IMIPENEM <=0.25 SENSITIVE Sensitive     NITROFURANTOIN 32 SENSITIVE Sensitive     TRIMETH/SULFA <=20 SENSITIVE Sensitive     AMPICILLIN/SULBACTAM 4 SENSITIVE Sensitive     PIP/TAZO <=4 SENSITIVE Sensitive     Extended ESBL NEGATIVE Sensitive     * >=100,000 COLONIES/mL ESCHERICHIA COLI      Studies: No results found.  Scheduled Meds: . apixaban  5 mg Oral BID  . carvedilol  3.125 mg Oral BID WC  . gabapentin  400 mg Oral TID  .  hydrocortisone sod succinate (SOLU-CORTEF) inj  50 mg Intravenous Q8H  . mouth rinse  15 mL Mouth Rinse BID  . senna-docusate  2 tablet Oral BID  . sulfamethoxazole-trimethoprim  1 tablet Oral Q12H    Continuous Infusions:    LOS: 3 days     Kayleen Memos, MD [facility] Pager [phone]  If 7PM-7AM, please contact night-coverage [email or web address] Password [facility] 01/13/2019, 12:15 PM

## 2019-01-13 NOTE — NC FL2 (Signed)
[place] LEVEL OF CARE SCREENING TOOL     IDENTIFICATION  Patient Name: Janice Morrow Birthdate: [birth year] Sex: female Admission Date (Current Location): 01/09/2019  [place] and [place] Number:  Kathleen Argue [id number] [place] and Address:  [facility] [place], [place] [address] [place], [place] [address]      Provider Number: [id number]  Attending Physician Name and Address:  Kayleen Memos, DO  Relative Name and Phone Number:  Sherrell Weir - brother, [phone]    Current Level of Care: Hospital Recommended Level of Care: [place] Prior Approval Number:    Date Approved/Denied:   PASRR Number: [id number](MUST ID# [id number])  Discharge Plan: SNF    Current Diagnoses: Patient Active Problem List   Diagnosis Date Noted  . Acute pyelonephritis 01/10/2019  . Sepsis ([place]) 01/10/2019  . Chronic diastolic CHF (congestive heart failure) ([place]) 01/10/2019  . Abdominal pain 01/10/2019  . PE (pulmonary thromboembolism) ([place]) 01/10/2019  . Mild renal insufficiency 07/28/2018  . Chronic respiratory failure with hypoxia ([place])   . Insomnia secondary to chronic pain 02/03/2018  . Cervical disc disease with myelopathy 01/28/2018  . Primary osteoarthritis of both knees 10/14/2017  . Type 2 diabetes mellitus with complication, without long-term current use of insulin ([place]) 05/20/2017  . Kidney stone on left side   . COPD (chronic obstructive pulmonary disease) ([place]) 08/24/2015  . Essential hypertension 08/24/2015  . Seizures ([place]) 08/24/2015  . Obesity hypoventilation syndrome ([place]) 08/24/2015  . Migraine without aura and with status migrainosus, not intractable 07/12/2015  . OSA on CPAP 07/12/2015  . Left ventricular diastolic dysfunction, NYHA class 1 04/11/2015  . Visit for screening mammogram 03/08/2015  . Trigeminal neuralgia of right side of face 11/23/2014  . Chronic back pain 11/26/2013  . Hyperlipidemia with target LDL less  than 130 09/01/2010  . Severe obesity (BMI >= 40) ([place]) 09/01/2010  . GERD 09/01/2010    Orientation RESPIRATION BLADDER Height & Weight     Self, Time, Place  O2(4 Liters Oxygen; patient has OSA and no longer uses a CPAP per H&P) Continent Weight: (!) 346 lb 5.5 oz (157.1 kg) Height:     BEHAVIORAL SYMPTOMS/MOOD NEUROLOGICAL BOWEL NUTRITION STATUS    Convulsions/Seizures(History of seizures) Continent(patient reported that she was too weak to clean herself) Diet(Heart healthy)  AMBULATORY STATUS COMMUNICATION OF NEEDS Skin   Total Care(Patient was unable to ambulate with PT during evaluation) Verbally Other (Comment)(Ecchymosis-right/left breast; Cracking-right/left foot; Serous blister-right heel with foam dressing; Stg 2 pressure injury to buttocks; Stg 2 pressure injury right posterior proximal thigh)                       Personal Care Assistance Level of Assistance  Bathing, Feeding, Dressing Bathing Assistance: Maximum assistance Feeding assistance: Independent Dressing Assistance: Maximum assistance     Functional Limitations Info  Sight, Hearing, Speech Sight Info: Adequate Hearing Info: Adequate Speech Info: Adequate    SPECIAL CARE FACTORS FREQUENCY  PT (By licensed PT)     PT Frequency: Evaluated 5/18 at hospital. PT at [facility] Eval and Treat; a minimum of 5 days per week               Contractures Contractures Info: Not present    Additional Factors Info  Code Status, Allergies Code Status Info: Full Allergies Info: Baclofen, Indur, Naproxen            Current Medications (01/13/2019):  This is the current hospital active  medication list Current Facility-Administered Medications  Medication Dose Route Frequency Provider Last Rate Last Dose  . acetaminophen (TYLENOL) tablet 650 mg  650 mg Oral Q6H PRN Ivor Costa, MD       Or  . acetaminophen (TYLENOL) suppository 650 mg  650 mg Rectal Q6H PRN Ivor Costa, MD      . apixaban Arne [place]) tablet 5 mg  5  mg Oral BID Ivor Costa, MD   5 mg at 01/13/19 0840  . carvedilol (COREG) tablet 3.125 mg  3.125 mg Oral BID WC Ivor Costa, MD   3.125 mg at 01/13/19 1610  . furosemide (LASIX) tablet 40 mg  40 mg Oral Daily Hall, Carole N, DO      . gabapentin (NEURONTIN) capsule 400 mg  400 mg Oral TID Irene Pap N, DO   400 mg at 01/13/19 9604  . hydrALAZINE (APRESOLINE) injection 5 mg  5 mg Intravenous Q2H PRN Ivor Costa, MD      . hydrocortisone sodium succinate (SOLU-CORTEF) 100 MG injection 50 mg  50 mg Intravenous Q8H Hall, Carole N, DO   50 mg at 01/13/19 0837  . levalbuterol (XOPENEX) nebulizer solution 0.63 mg  0.63 mg Inhalation Q6H PRN Ivor Costa, MD      . MEDLINE mouth rinse  15 mL Mouth Rinse BID Irene Pap N, DO   15 mL at 01/13/19 0844  . morphine 2 MG/ML injection 2 mg  2 mg Intravenous Q4H PRN Ivor Costa, MD   2 mg at 01/11/19 1825  . ondansetron (ZOFRAN) tablet 4 mg  4 mg Oral Q6H PRN Ivor Costa, MD       Or  . ondansetron [facility]) injection 4 mg  4 mg Intravenous Q6H PRN Ivor Costa, MD   4 mg at 01/10/19 1649  . oxyCODONE-acetaminophen (PERCOCET/ROXICET) 5-325 MG per tablet 1 tablet  1 tablet Oral Q4H PRN Ivor Costa, MD   1 tablet at 01/11/19 0815  . senna-docusate (Senokot-S) tablet 2 tablet  2 tablet Oral BID Irene Pap N, DO   2 tablet at 01/13/19 0840  . sulfamethoxazole-trimethoprim (BACTRIM DS) 800-160 MG per tablet 1 tablet  1 tablet Oral Q12H Irene Pap N, DO   1 tablet at 01/13/19 0840     Discharge Medications: Please see discharge summary for a list of discharge medications.  Relevant Imaging Results:  Relevant Lab Results:   Additional Information ss#[id number]; 346 pounds  Sharlet Salina, [place]a [place], [place]

## 2019-01-14 ENCOUNTER — Inpatient Hospital Stay (HOSPITAL_COMMUNITY): Payer: Medicare Other

## 2019-01-14 DIAGNOSIS — L8990 Pressure ulcer of unspecified site, unspecified stage: Secondary | ICD-10-CM

## 2019-01-14 LAB — TROPONIN I
Troponin I: <0.03 ng/mL (ref <0.03)
Troponin I: <0.03 ng/mL (ref <0.03)

## 2019-01-14 LAB — CULTURE, BLOOD (ROUTINE X 2)
Culture: NO GROWTH
Culture: NO GROWTH
Special Requests: ADEQUATE

## 2019-01-14 LAB — GLUCOSE, CAPILLARY: Glucose-Capillary: 177 mg/dL — ABNORMAL HIGH (ref 70–99)

## 2019-01-14 MED ORDER — CARVEDILOL 6.25 MG PO TABS
6.2500 mg | ORAL_TABLET | Freq: Two times a day (BID) | ORAL | Status: DC
  Administered 2019-01-14 – 2019-01-15 (×3): 6.25 mg via ORAL
  Filled 2019-01-14 (×3): qty 1

## 2019-01-14 MED ORDER — PREDNISONE 20 MG PO TABS
40.0000 mg | ORAL_TABLET | Freq: Every day | ORAL | Status: DC
  Administered 2019-01-14 – 2019-01-15 (×2): 40 mg via ORAL
  Filled 2019-01-14 (×2): qty 2

## 2019-01-14 MED ORDER — MORPHINE SULFATE (PF) 2 MG/ML IV SOLN: 1.0000 mg | INTRAVENOUS | Status: DC | PRN

## 2019-01-14 NOTE — Progress Notes (Signed)
[facility] PROGRESS NOTE  Janice Morrow GGE:[id number] DOB: [birth year] DOA: 01/09/2019 PCP: Janith Lima, MD   Narrative: 73 year old obese African-American female, BMI 58 COPD on 4 L of oxygen at baseline, OSA noncompliant on CPAP-uses oxygen at home-->?  Pulmonary HTN dyspnea Prior kidney stone Cardiac cath EF 40-45% with global hypokinesis-seen on cardiac cath 2 519-30% stenosis visualized at that time Hyperlipidemia, HTN, CKD 3, osteoarthritis, prior pulmonary embolism  Patient admitted 5/16 signs of sepsis fever chills back pain-eventually grew E. coli in urine Her back pain work-up revealed new L4-5 central disc protrusion and gabapentin Solu-Cortef seem to help  She started to complain of chest pain overnight 5/19  A & Plan Chest pain EKG my over read = sinus with PACs PR interval 0.12 inversions which are new and V2 3-I will get another troponin as the first 1 was 0.03 She states that she was sweating when she woke up in the morning at 134 this morning Her last cardiac cath is as above If her troponin bumps we will need to speak to cardiology but I will let her eat at this time as she has atypical as well as typical features Did increase to 6.25 from Coreg 3.125 twice daily, Lasix 40 daily  Sepsis secondary to E. coli pyelonephritis Patient was treated since 5/15 with ceftriaxone and transitioned on 5/19 to Bactrim-she will complete a 7-day course on 5/21 Her blood cultures are negative  Acute back pain MRI spine showed disc protrusion and foraminal stenosis-continuing gabapentin 400 3 times daily taper Cortef to prednisone 40 daily Will need significant increase in mobility to prevent further degeneration and will need skilled facility Morning.  will minimize IV morphine and place only on Percocet at this time  OSA, chronic restrictive respiratory failure secondary to morbid obesity habitus, COPD Encouraged the patient to use the BiPAP machine-she only uses  oxygen at 4 L at home  Prior pulmonary embolism-continue Eliquis  Super obesity Life-threatening  Sacral decubitus and right heel decubitus ulcers-turn as able-patient to ambulate and sit at side of bed for meals  DVT Eliquis code Status: Full code communication: No family present at this time disposition Plan: Inpatient pending resolution of chest pain likely skilled facility in the next 24 to 48 hours of no recurrence   Janice Neville, MD  [facility] [address] [place] [email or web address] 7PM-7AM contact night coverage as above 01/14/2019, 8:16 AM  LOS: 4 days   Consultants:  None  Procedures:  EKGs  Antimicrobials:  Ceftriaxone and then Bactrim  Interval history/Subjective: Awake alert pleasant not having chest pain at this time no nausea no vomiting Tells me she woke up at 134 this morning with discomfort and was sweating Tells me that she is seen a doctor by the name of Dr. Stanford Breed in the past with regards to her chest pain She lives with her brother at home and gives herself her meds At baseline she seems to have poor functional status  Objective:  Vitals:  Vitals:   01/14/19 0157 01/14/19 0334  BP: (!) 156/78 (!) 152/91  Pulse: 91 85  Resp:  20  Temp: 98.6 F (37 C) 98.2 F (36.8 C)  SpO2: 94% 95%    Exam:  Awake oriented [place]atti 4, EOMI NCAT, GCS 15 no pallor no icterus Cannot appreciate JVD secondary to her obese habitus Chest clinically clear no added sound Abdomen is soft no rebound no guarding she does have an increase in lumbar lordosis She does have a stage I  decubitus in her natal cleft and on her right heel She has xerosis of lower extremities S1-S2 no murmur Neurologically intact   I have personally reviewed the following:  DATA   Labs:  Troponins negative so far no other labs today   Scheduled Meds: . apixaban  5 mg Oral BID  . carvedilol  6.25 mg Oral BID WC  . furosemide  40 mg Oral Daily  . gabapentin  400 mg Oral TID  .  mouth rinse  15 mL Mouth Rinse BID  . predniSONE  40 mg Oral QAC breakfast  . senna-docusate  2 tablet Oral BID  . sulfamethoxazole-trimethoprim  1 tablet Oral Q12H   Continuous Infusions:  Principal Problem:   Acute pyelonephritis Active Problems:   GERD   Chronic back pain   OSA on CPAP   COPD (chronic obstructive pulmonary disease) (HCC)   Essential hypertension   Chronic respiratory failure with hypoxia (HCC)   Sepsis (HCC)   Chronic diastolic CHF (congestive heart failure) (HCC)   Abdominal pain   PE (pulmonary thromboembolism) (HCC)   Pressure injury of skin   LOS: 4 days

## 2019-01-14 NOTE — Consult Note (Signed)
   [facility] Inpatient Consult   01/14/2019  Janice Morrow [birth year] [id number]  Patient screened for  potential [place] Management services needed. Patient is in the Medicare [place].  Patient has a prior [place] Management out reach in the distant past.   Review of patient's medical record reveals patient is [per MD notes 01/10/2019 Dr. Hall]: Janice Morrow a 73 y.o.femalewith medical history significant ofhypertension, hyperlipidemia, COPD on 4 L oxygen,dCHF, GERD, migraine, OSA on CPAP, morbid obesity, kidney stone, CKD 3, who presents with abdominal pain, back pain, fever, chills, dysuria.  Primary Care Provider is: Dr. [name], [place]  Transportation to provider: Mainly inactive, lives with brother at [facility] room.  Review of  inpatient [facility] team notes that the patient is adamently refusing a skilled nursing stay as recommended by the therapy team..   Plan:  Continue to follow progress and disposition to assess for post hospital care management needs and check on disposition with [facility] program.   Please place a [facility] Care Management consult as appropriate and for questions contact:   Natividad Brood, RN BSN [facility] Liaison  [phone] business mobile phone Toll free office [phone]  Fax number: [phone] [email or web address]

## 2019-01-14 NOTE — Progress Notes (Signed)
Reviewed patient ~ 0915 No CP now Would only monitor as 2nd troponin neg If further recurrence, patient to alert staff  Verneita Griffes, MD [facility] 9:39 AM

## 2019-01-14 NOTE — Progress Notes (Signed)
Pt c/o chest discomfort. Per pt 'It feels like an elephant is sitting on my chest". On call provider Jeannette Corpus made made aware. Will continue to monitor.

## 2019-01-14 NOTE — TOC Progression Note (Signed)
Transition of Care [facility]) - Progression Note    Patient Details  Name: Janice Morrow MRN: [id number] Date of Birth: 17-Feb-1946  Transition of Care [facility]) CM/SW Contact  Bartholomew Crews, RN Phone Number: [phone] 01/14/2019, 1:46 PM  Clinical Narrative:    Telephone to Tommi Rumps at [place] to f/u with Home First referral. Patient is a good candidate for the program. Discussed brother's concerns - Tommi Rumps to f/u with brother. [facility]N aware. Patient will need [place] orders for RN, PT, and OT when ready for discharge. CM to continue to follow for transition of care needs.   Expected Discharge Plan: [place] Barriers to Discharge: SNF Pending bed offer  Expected Discharge Plan and Services Expected Discharge Plan: [place] In-house Referral: Clinical Social Work Discharge Planning Services: CM Consult Post Acute Care Choice: [place] arrangements for the past 2 months: Hotel/Motel([place])                           [place] Arranged: RN, Refused SNF, PT, OT(Patient will also get a SW and Aide) [place]: [place] Date [facility] Agency Contacted: 01/13/19 Time [place]: [place] Representative spoke with at [place]: [place] ([place]) Interventions    Readmission Risk Interventions No flowsheet data found.

## 2019-01-14 NOTE — Progress Notes (Signed)
Patient refused CPAP HS tonight 

## 2019-01-15 ENCOUNTER — Other Ambulatory Visit: Payer: Self-pay

## 2019-01-15 ENCOUNTER — Emergency Department (HOSPITAL_COMMUNITY): Payer: Medicare Other

## 2019-01-15 ENCOUNTER — Encounter (HOSPITAL_COMMUNITY): Payer: Self-pay | Admitting: Emergency Medicine

## 2019-01-15 ENCOUNTER — Emergency Department (HOSPITAL_COMMUNITY)
Admission: EM | Admit: 2019-01-15 | Discharge: 2019-01-16 | Disposition: A | Discharge status: Home / Self Care | Payer: Medicare Other | Attending: Emergency Medicine | Admitting: Emergency Medicine

## 2019-01-15 DIAGNOSIS — W06XXXA Fall from bed, initial encounter: Secondary | ICD-10-CM | POA: Insufficient documentation

## 2019-01-15 DIAGNOSIS — Y9259 Other trade areas as the place of occurrence of the external cause: Secondary | ICD-10-CM | POA: Insufficient documentation

## 2019-01-15 DIAGNOSIS — Z20828 Contact with and (suspected) exposure to other viral communicable diseases: Secondary | ICD-10-CM | POA: Diagnosis not present

## 2019-01-15 DIAGNOSIS — B888 Other specified infestations: Secondary | ICD-10-CM | POA: Insufficient documentation

## 2019-01-15 DIAGNOSIS — Y999 Unspecified external cause status: Secondary | ICD-10-CM | POA: Insufficient documentation

## 2019-01-15 DIAGNOSIS — S8001XA Contusion of right knee, initial encounter: Secondary | ICD-10-CM | POA: Insufficient documentation

## 2019-01-15 DIAGNOSIS — R262 Difficulty in walking, not elsewhere classified: Secondary | ICD-10-CM

## 2019-01-15 DIAGNOSIS — M25561 Pain in right knee: Secondary | ICD-10-CM | POA: Diagnosis not present

## 2019-01-15 DIAGNOSIS — R2689 Other abnormalities of gait and mobility: Secondary | ICD-10-CM | POA: Diagnosis not present

## 2019-01-15 DIAGNOSIS — Z1159 Encounter for screening for other viral diseases: Secondary | ICD-10-CM | POA: Insufficient documentation

## 2019-01-15 DIAGNOSIS — B882 Other arthropod infestations: Secondary | ICD-10-CM | POA: Diagnosis not present

## 2019-01-15 DIAGNOSIS — Y939 Activity, unspecified: Secondary | ICD-10-CM | POA: Insufficient documentation

## 2019-01-15 DIAGNOSIS — S8991XA Unspecified injury of right lower leg, initial encounter: Secondary | ICD-10-CM | POA: Diagnosis not present

## 2019-01-15 LAB — BASIC METABOLIC PANEL
Anion gap: 4 — ABNORMAL LOW (ref 5–15)
Anion gap: 9 (ref 5–15)
BUN: 22 mg/dL (ref 8–23)
BUN: 21 mg/dL (ref 8–23)
CO2: 33 mmol/L — ABNORMAL HIGH (ref 22–32)
CO2: 33 mmol/L — ABNORMAL HIGH (ref 22–32)
Calcium: 9.1 mg/dL (ref 8.9–10.3)
Calcium: 9.5 mg/dL (ref 8.9–10.3)
Chloride: 102 mmol/L (ref 98–111)
Chloride: 93 mmol/L — ABNORMAL LOW (ref 98–111)
Creatinine, Ser: 1.01 mg/dL — ABNORMAL HIGH (ref 0.44–1.00)
Creatinine, Ser: 1.17 mg/dL — ABNORMAL HIGH (ref 0.44–1.00)
GFR calc Af Amer: >60 mL/min (ref >60)
GFR calc Af Amer: 55 mL/min — ABNORMAL LOW (ref >60)
GFR calc non Af Amer: 56 mL/min — ABNORMAL LOW (ref >60)
GFR calc non Af Amer: 47 mL/min — ABNORMAL LOW (ref >60)
Glucose, Bld: 96 mg/dL (ref 70–99)
Glucose, Bld: 178 mg/dL — ABNORMAL HIGH (ref 70–99)
Potassium: 4.8 mmol/L (ref 3.5–5.1)
Potassium: 5.4 mmol/L — ABNORMAL HIGH (ref 3.5–5.1)
Sodium: 139 mmol/L (ref 135–145)
Sodium: 135 mmol/L (ref 135–145)

## 2019-01-15 LAB — URINALYSIS, ROUTINE W REFLEX MICROSCOPIC
Bilirubin Urine: NEGATIVE
Glucose, UA: NEGATIVE mg/dL
Ketones, ur: NEGATIVE mg/dL
Leukocytes,Ua: NEGATIVE
Nitrite: NEGATIVE
Protein, ur: NEGATIVE mg/dL
Specific Gravity, Urine: 1.01 (ref 1.005–1.030)
pH: 6 (ref 5.0–8.0)

## 2019-01-15 LAB — CBC WITH DIFFERENTIAL/PLATELET
Abs Immature Granulocytes: 0.06 10*3/uL (ref 0.00–0.07)
Abs Immature Granulocytes: 0.11 10*3/uL — ABNORMAL HIGH (ref 0.00–0.07)
Basophils Absolute: 0 10*3/uL (ref 0.0–0.1)
Basophils Absolute: 0 10*3/uL (ref 0.0–0.1)
Basophils Relative: 0 %
Basophils Relative: 0 %
Eosinophils Absolute: 0.1 10*3/uL (ref 0.0–0.5)
Eosinophils Absolute: 0 10*3/uL (ref 0.0–0.5)
Eosinophils Relative: 1 %
Eosinophils Relative: 0 %
HCT: 45.4 % (ref 36.0–46.0)
HCT: 48.3 % — ABNORMAL HIGH (ref 36.0–46.0)
Hemoglobin: 13.2 g/dL (ref 12.0–15.0)
Hemoglobin: 14.2 g/dL (ref 12.0–15.0)
Immature Granulocytes: 1 %
Immature Granulocytes: 1 %
Lymphocytes Relative: 22 %
Lymphocytes Relative: 11 %
Lymphs Abs: 2.1 10*3/uL (ref 0.7–4.0)
Lymphs Abs: 0.9 10*3/uL (ref 0.7–4.0)
MCH: 26.8 pg (ref 26.0–34.0)
MCH: 26.9 pg (ref 26.0–34.0)
MCHC: 29.1 g/dL — ABNORMAL LOW (ref 30.0–36.0)
MCHC: 29.4 g/dL — ABNORMAL LOW (ref 30.0–36.0)
MCV: 92.3 fL (ref 80.0–100.0)
MCV: 91.5 fL (ref 80.0–100.0)
Monocytes Absolute: 0.9 10*3/uL (ref 0.1–1.0)
Monocytes Absolute: 0.2 10*3/uL (ref 0.1–1.0)
Monocytes Relative: 9 %
Monocytes Relative: 2 %
Neutro Abs: 6.4 10*3/uL (ref 1.7–7.7)
Neutro Abs: 7.5 10*3/uL (ref 1.7–7.7)
Neutrophils Relative %: 67 %
Neutrophils Relative %: 86 %
Platelets: 199 10*3/uL (ref 150–400)
Platelets: 231 10*3/uL (ref 150–400)
RBC: 4.92 MIL/uL (ref 3.87–5.11)
RBC: 5.28 MIL/uL — ABNORMAL HIGH (ref 3.87–5.11)
RDW: 14.9 % (ref 11.5–15.5)
RDW: 14.8 % (ref 11.5–15.5)
WBC: 9.6 10*3/uL (ref 4.0–10.5)
WBC: 8.7 10*3/uL (ref 4.0–10.5)
nRBC: 0 % (ref 0.0–0.2)
nRBC: 0 % (ref 0.0–0.2)

## 2019-01-15 MED ORDER — OXYCODONE-ACETAMINOPHEN 5-325 MG PO TABS
1.0000 | ORAL_TABLET | ORAL | Status: DC | PRN
  Administered 2019-01-16 (×2): 1 via ORAL
  Filled 2019-01-15 (×2): qty 1

## 2019-01-15 MED ORDER — CARVEDILOL 12.5 MG PO TABS
6.2500 mg | ORAL_TABLET | Freq: Two times a day (BID) | ORAL | Status: DC
  Administered 2019-01-16: 6.25 mg via ORAL
  Filled 2019-01-15: qty 1

## 2019-01-15 MED ORDER — OXYCODONE-ACETAMINOPHEN 5-325 MG PO TABS: 1.0000 | ORAL_TABLET | ORAL | 0 refills | Status: AC | PRN

## 2019-01-15 MED ORDER — CARVEDILOL 6.25 MG PO TABS: 6.2500 mg | ORAL_TABLET | Freq: Two times a day (BID) | ORAL | 3 refills | Status: DC

## 2019-01-15 MED ORDER — APIXABAN 5 MG PO TABS
5.0000 mg | ORAL_TABLET | Freq: Two times a day (BID) | ORAL | Status: DC
  Administered 2019-01-16: 5 mg via ORAL
  Filled 2019-01-15 (×5): qty 1

## 2019-01-15 MED ORDER — GABAPENTIN 400 MG PO CAPS
400.0000 mg | ORAL_CAPSULE | Freq: Three times a day (TID) | ORAL | Status: DC
  Administered 2019-01-16 (×2): 400 mg via ORAL
  Filled 2019-01-15 (×2): qty 1

## 2019-01-15 MED ORDER — GABAPENTIN 400 MG PO CAPS: 400.0000 mg | ORAL_CAPSULE | Freq: Three times a day (TID) | ORAL | 0 refills | Status: DC

## 2019-01-15 NOTE — TOC Initial Note (Signed)
Transition of Care [facility]) - Initial/Assessment Note    Patient Details  Name: Janice Morrow MRN: [id number] Date of Birth: September 12, 1945  Transition of Care [facility]) CM/SW Contact:    Oretha Milch, LCSW Phone Number: 01/15/2019, 7:52 PM  Clinical Narrative:  CSW spoke with patient regarding patient being discharged for less than 5 hours. CSW noted patient reported she had misunderstood her insurance coverage for SNF and when speaking with her brother decided she was open to it now. CSW reflected on fall and noted patient's report of being unable to get out of the bed. CSW spoke with patient's brother and received similar collateral. Patient is open to SNF referral to the [facility] area and reports she has no preference at this time.          Expected Discharge Plan: Skilled Nursing Facility Barriers to Discharge: Insurance Authorization   Patient Goals and CMS Choice Patient states their goals for this hospitalization and ongoing recovery are:: "After I talked with my brother I realize I need rehab now." CMS Medicare.gov Compare Post Acute Care list provided to:: Patient Choice offered to / list presented to : Patient  Expected Discharge Plan and Services Expected Discharge Plan: [place] Choice: [place]                                        Prior Living Arrangements/Services       Do you feel safe going back to the place where you live?: No               Activities of Daily Living      Permission Sought/Granted         Permission granted to share info w AGENCY: SNF facilities for referral        Emotional Assessment     Affect (typically observed): Adaptable, Appropriate, Calm   Alcohol / Substance Use: Not Applicable    Admission diagnosis:  fall Patient Active Problem List   Diagnosis Date Noted  . Pressure injury of skin 01/14/2019  . Acute pyelonephritis 01/10/2019  . Sepsis ([place])  01/10/2019  . Chronic diastolic CHF (congestive heart failure) ([place]) 01/10/2019  . Abdominal pain 01/10/2019  . PE (pulmonary thromboembolism) ([place]) 01/10/2019  . Mild renal insufficiency 07/28/2018  . Chronic respiratory failure with hypoxia ([place])   . Insomnia secondary to chronic pain 02/03/2018  . Cervical disc disease with myelopathy 01/28/2018  . Primary osteoarthritis of both knees 10/14/2017  . Type 2 diabetes mellitus with complication, without long-term current use of insulin ([place]) 05/20/2017  . Kidney stone on left side   . COPD (chronic obstructive pulmonary disease) ([place]) 08/24/2015  . Essential hypertension 08/24/2015  . Seizures ([place]) 08/24/2015  . Obesity hypoventilation syndrome ([place]) 08/24/2015  . Migraine without aura and with status migrainosus, not intractable 07/12/2015  . OSA on CPAP 07/12/2015  . Left ventricular diastolic dysfunction, NYHA class 1 04/11/2015  . Visit for screening mammogram 03/08/2015  . Trigeminal neuralgia of right side of face 11/23/2014  . Chronic back pain 11/26/2013  . Hyperlipidemia with target LDL less than 130 09/01/2010  . Severe obesity (BMI >= 40) ([place]) 09/01/2010  . GERD 09/01/2010   PCP:  Janith Lima, MD Pharmacy:   [id number] - [place], [place]  [address] Phone: [phone] Fax: [phone]  [place], [place] - [address] [place] [address] Phone: [phone] Fax: [phone]  [id number] - [place] [place] [address] Phone: [phone] Fax: [phone]     Social Determinants of Health (SDOH) Interventions    Readmission Risk Interventions No flowsheet data found.

## 2019-01-15 NOTE — Progress Notes (Signed)
DISCHARGE NOTE HOME Janice Morrow to be discharged Home per MD order. Discussed prescriptions and follow up appointments with the patient. Prescriptions given to patient; medication list explained in detail. Patient verbalized understanding.  Skin clean, dry and intact without evidence of skin break down, no evidence of skin tears noted. IV catheter discontinued intact. Site without signs and symptoms of complications. Dressing and pressure applied. Pt denies pain at the site currently. No complaints noted.  Patient free of lines, drains, and wounds.   An After Visit Summary (AVS) was printed and given to the patient. Patient escorted via wheelchair, and discharged home via private auto.  Paulla Fore, RN

## 2019-01-15 NOTE — TOC Transition Note (Addendum)
Transition of Care [facility]) - CM/SW Discharge Note   Patient Details  Name: Janice Morrow MRN: [id number] Date of Birth: 10/21/1945  [facility] CM/SW Contact:  Carles Collet, RN Phone Number: 01/15/2019, 10:53 AM   Clinical Narrative:    Verified with Tommi Rumps at [place] that patient accepted into home first program. She has [facility] for either tonight or first thing in the AM. She has WC in storage (hotel room small enough she does not need it) RE and oxygen at home. Oxygen through [employer]. Patient states she knows how to use concentrator. PTAR papers printed out to chart. Spoke w nurse requesting transport at 1:00, PTAR called and transport scheduled. Patient updated. Confirmed her brother would be available to let her in.    Final next level of care: [place] Barriers to Discharge: No Barriers Identified   Patient Goals and CMS Choice Patient states their goals for this hospitalization and ongoing recovery are:: Janice Morrow reported that she had a UTI and is now better and can go home and take care of herself CMS Medicare.gov Compare Post Acute Care list provided to:: (Not provided as patient refused SNF) Choice offered to / list presented to : NA(Patient refused SNF)  Discharge Placement                       Discharge Plan and Services In-house Referral: Clinical Social Work Discharge Planning Services: CM Consult Post Acute Care Choice: Home Health                    HH Arranged: PT, RN, OT, Social Work [place] Agency: [place] Date [facility] Agency Contacted: 01/15/19 Time [place]: 1053 Representative spoke with at [place]: cory  Social Determinants of Health ([place]) Interventions     Readmission Risk Interventions No flowsheet data found.

## 2019-01-15 NOTE — ED Provider Notes (Signed)
[facility] EMERGENCY DEPARTMENT Provider Note   CSN: [id number] Arrival date & time: 01/15/19  1854    History   Chief Complaint Chief Complaint  Patient presents with  . Fall    HPI Janice Morrow is a 73 y.o. female.     Pt presents to the ED today as a fall.  Pt was admitted from 5/15 to today due to sepsis from an UTI.  SNF was recommended at d/c, but pt refused.  She thought she would have to pay out of pocket and did not realize that medicare would help her.  The pt was just released about 5 hours ago.  She slipped out of bed and could not get up.  EMS said pt's hotel room had bedbugs and she has stool all over herself.  Pt c/o right knee pain from fall.     Past Medical History:  Diagnosis Date  . Acute diastolic CHF (congestive heart failure) ([place]) 08/24/2015  . Cervicalgia   . Chronic back pain   . COPD (chronic obstructive pulmonary disease) (HCC)    on 4L home O2  . Essential hypertension 08/24/2015  . GERD (gastroesophageal reflux disease)   . Headache(784.0)   . Heart murmur   . History of kidney stones   . Hyperlipidemia   . Migraine without aura, with intractable migraine, so stated, without mention of status migrainosus   . OSA on CPAP    she no longer wears CPAP  . Osteoarthritis   . Pulmonary embolism ([place])   . PVD (peripheral vascular disease) ([place])   . Seizures ([place]) 07/2014, 10/2014   due to baclofen  . Shortness of breath dyspnea   . Trigeminal neuralgia     Patient Active Problem List   Diagnosis Date Noted  . Pressure injury of skin 01/14/2019  . Acute pyelonephritis 01/10/2019  . Sepsis ([place]) 01/10/2019  . Chronic diastolic CHF (congestive heart failure) ([place]) 01/10/2019  . Abdominal pain 01/10/2019  . PE (pulmonary thromboembolism) ([place]) 01/10/2019  . Mild renal insufficiency 07/28/2018  . Chronic respiratory failure with hypoxia ([place])   . Insomnia secondary to chronic pain 02/03/2018  . Cervical disc disease  with myelopathy 01/28/2018  . Primary osteoarthritis of both knees 10/14/2017  . Type 2 diabetes mellitus with complication, without long-term current use of insulin ([place]) 05/20/2017  . Kidney stone on left side   . COPD (chronic obstructive pulmonary disease) ([place]) 08/24/2015  . Essential hypertension 08/24/2015  . Seizures ([place]) 08/24/2015  . Obesity hypoventilation syndrome ([place]) 08/24/2015  . Migraine without aura and with status migrainosus, not intractable 07/12/2015  . OSA on CPAP 07/12/2015  . Left ventricular diastolic dysfunction, NYHA class 1 04/11/2015  . Visit for screening mammogram 03/08/2015  . Trigeminal neuralgia of right side of face 11/23/2014  . Chronic back pain 11/26/2013  . Hyperlipidemia with target LDL less than 130 09/01/2010  . Severe obesity (BMI >= 40) ([place]) 09/01/2010  . GERD 09/01/2010    Past Surgical History:  Procedure Laterality Date  . LEFT HEART CATH AND CORONARY ANGIOGRAPHY N/A 10/01/2017   Procedure: LEFT HEART CATH AND CORONARY ANGIOGRAPHY;  Surgeon: Leonie Man, MD;  Location: [facility] CV LAB;  Service: Cardiovascular;  Laterality: N/A;  . MULTIPLE TOOTH EXTRACTIONS     "they took out part of my teeth; I took out the rest when they got loose; was suppose to get dentures; never did"  . NO PAST SURGERIES    . URETEROSCOPY WITH  HOLMIUM LASER LITHOTRIPSY Left 07/09/2017   Procedure: URETEROSCOPY WITH HOLMIUM LASER LITHOTRIPSY/ STENT;  Surgeon: Festus Aloe, MD;  Location: [facility];  Service: Urology;  Laterality: Left;  . URETEROSCOPY WITH HOLMIUM LASER LITHOTRIPSY Left 07/23/2017   Procedure: URETEROSCOPY WITH HOLMIUM LASER LITHOTRIPSY /STENT;  Surgeon: Festus Aloe, MD;  Location: [facility];  Service: Urology;  Laterality: Left;  NEEDS 60 MINUTES FOR PROCEDURE     OB History    Gravida  4   Para  1   Term  1   Preterm      AB  3   Living  0     SAB  3   TAB      Ectopic      Multiple      Live Births            Obstetric Comments  Dec in [place] Medications    Prior to Admission medications   Medication Sig Start Date End Date Taking? Authorizing Provider  carvedilol (COREG) 6.25 MG tablet Take 1 tablet (6.25 mg total) by mouth 2 (two) times daily with a meal. 01/15/19   Samtani, Jai-Gurmukh, MD  ELIQUIS 5 MG TABS tablet TAKE 1 TABLET BY MOUTH TWICE A DAY Patient taking differently: Take 5 mg by mouth 2 (two) times daily.  04/28/18   Janith Lima, MD  furosemide (LASIX) 40 MG tablet TAKE 1 TABLET (40 MG TOTAL) BY MOUTH DAILY. Patient not taking: Reported on 01/10/2019 04/20/18   Janith Lima, MD  gabapentin (NEURONTIN) 400 MG capsule Take 1 capsule (400 mg total) by mouth 3 (three) times daily. 01/15/19   Nita Sells, MD  ipratropium-albuterol (DUONEB) 0.5-2.5 (3) MG/3ML SOLN Take 3 mLs by nebulization 3 (three) times daily. Patient not taking: Reported on 01/10/2019 04/14/18   Janith Lima, MD  oxyCODONE-acetaminophen (PERCOCET/ROXICET) 5-325 MG tablet Take 1 tablet by mouth every 4 (four) hours as needed for up to 5 days for moderate pain. 01/15/19 01/20/19  Nita Sells, MD  OXYGEN Inhale 4 L into the lungs continuous.     [provider]  umeclidinium-vilanterol (ANORO ELLIPTA) 62.5-25 MCG/INH AEPB Inhale 1 puff into the lungs daily. Patient not taking: Reported on 01/10/2019 08/12/18   Janith Lima, MD    Family History Family History  Problem Relation Age of Onset  . Heart attack Father   . Diabetes Mother   . Dementia Mother   . Diabetes Brother   . Heart disease Other   . Diabetes Other     Social History Social History   Tobacco Use  . Smoking status: Former Smoker    Packs/day: 0.30    Years: 30.00    Pack years: 9.00    Types: Cigarettes    Last attempt to quit: 08/22/2010    Years since quitting: 8.4  . Smokeless tobacco: Never Used  Substance Use Topics  . Alcohol use: No    Alcohol/week: 0.0 standard drinks  . Drug  use: No     Allergies   Baclofen; Imdur [isosorbide dinitrate]; and Naproxen   Review of Systems Review of Systems  Musculoskeletal:       Right knee pain  Neurological: Positive for weakness.  All other systems reviewed and are negative.    Physical Exam Updated Vital Signs BP (!) 150/85 (BP Location: Right Arm)   Pulse 80   Temp 98.3 F (36.8 C) (Oral)   Resp 18  LMP 08/27/1993 (Approximate)   SpO2 94%   Physical Exam Vitals signs and nursing note reviewed.  Constitutional:      Appearance: Normal appearance. She is obese.  HENT:     Head: Normocephalic and atraumatic.     Right Ear: External ear normal.     Left Ear: External ear normal.     Nose: Nose normal.     Mouth/Throat:     Mouth: Mucous membranes are moist.     Pharynx: Oropharynx is clear.  Eyes:     Extraocular Movements: Extraocular movements intact.     Conjunctiva/sclera: Conjunctivae normal.     Pupils: Pupils are equal, round, and reactive to light.  Neck:     Musculoskeletal: Normal range of motion and neck supple.  Cardiovascular:     Rate and Rhythm: Normal rate and regular rhythm.     Pulses: Normal pulses.     Heart sounds: Normal heart sounds.  Pulmonary:     Effort: Pulmonary effort is normal.     Breath sounds: Normal breath sounds.  Abdominal:     General: Abdomen is flat. Bowel sounds are normal.     Palpations: Abdomen is soft.  Musculoskeletal:     Right knee: Tenderness found.  Skin:    General: Skin is warm.     Capillary Refill: Capillary refill takes less than 2 seconds.  Neurological:     General: No focal deficit present.     Mental Status: She is alert and oriented to person, place, and time.  Psychiatric:        Mood and Affect: Mood normal.        Behavior: Behavior normal.      ED Treatments / Results  Labs (all labs ordered are listed, but only abnormal results are displayed) Labs Reviewed  BASIC METABOLIC PANEL - Abnormal; Notable for the following  components:      Result Value   Potassium 5.4 (*)    Chloride 93 (*)    CO2 33 (*)    Glucose, Bld 178 (*)    Creatinine, Ser 1.17 (*)    GFR calc non Af Amer 47 (*)    GFR calc Af Amer 55 (*)    All other components within normal limits  CBC WITH DIFFERENTIAL/PLATELET - Abnormal; Notable for the following components:   RBC 5.28 (*)    HCT 48.3 (*)    MCHC 29.4 (*)    Abs Immature Granulocytes 0.11 (*)    All other components within normal limits  URINALYSIS, ROUTINE W REFLEX MICROSCOPIC - Abnormal; Notable for the following components:   Hgb urine dipstick SMALL (*)    Bacteria, UA RARE (*)    All other components within normal limits    EKG None  Radiology Dg Chest Port 1 View  Result Date: 01/14/2019 CLINICAL DATA:  Chest pain EXAM: PORTABLE CHEST 1 VIEW COMPARISON:  01/09/2019 FINDINGS: Cardiac shadow is enlarged. The lungs are well aerated bilaterally. Mild bibasilar atelectasis is seen. No significant interstitial edema is noted. No pleural effusions are seen. No bony abnormality is noted. IMPRESSION: Bibasilar atelectasis stable from the prior exam. Electronically Signed   By: Inez Catalina M.D.   On: 01/14/2019 07:57    Procedures Procedures (including critical care time)  Medications Ordered in ED Medications  carvedilol (COREG) tablet 6.25 mg (has no administration in time range)  apixaban (ELIQUIS) tablet 5 mg (has no administration in time range)  gabapentin (NEURONTIN) capsule 400 mg (has no  administration in time range)  oxyCODONE-acetaminophen (PERCOCET/ROXICET) 5-325 MG per tablet 1 tablet (has no administration in time range)     Initial Impression / Assessment and Plan / ED Course  I have reviewed the triage vital signs and the nursing notes.  Pertinent labs & imaging results that were available during my care of the patient were reviewed by me and considered in my medical decision making (see chart for details).     SW consulted upon pt's arrival to  work on SNF placement.  Pt still needs her xray of her knee.   Final Clinical Impressions(s) / ED Diagnoses   Final diagnoses:  Ambulatory dysfunction  Infestation by cimex  Contusion of right knee, initial encounter    ED Discharge Orders    None       Isla Pence, MD 01/15/19 2250

## 2019-01-15 NOTE — Progress Notes (Addendum)
8:00PM: Patient open to SNF referral. CSW faxed out referrals to [facility] SNFs per patient's request and provided patient with Medicare Nursing Home Compare list. CSW following for bed offers.  CSW received social work consult. It appears patient was recommended SNF with her most recent hospital visit but refused SNF placement. Patient returned to her residence a motel and discharged earlier this afternoon. Patient has returned within 5 hours of discharge. CSW will follow up with patient to come up with appropriate discharge plan.  Lamonte Richer, LCSW, [place] Worker II [phone]

## 2019-01-15 NOTE — Discharge Summary (Signed)
Physician Discharge Summary  Janice Morrow GEX:[id number] DOB: 1946-07-25 DOA: 01/09/2019  PCP: Janith Lima, MD  Admit date: 01/09/2019 Discharge date: 01/15/2019  Time spent: 35 minutes  Recommendations for Outpatient Follow-up:  1. Patient will need Chem-12 TSH as well as CBC in about 1 week 2. Patient will get max home health management to help facilitate her rehabilitation-I am hopeful she will be able to participate meaningfully 3. Patient will need follow-up with cardiology in the outpatient setting-I will CC Dr. Stanford Breed her cardiologist as she did have 2 episodes of what seemed like noncardiogenic chest pain-see below 4. Patient will need weight loss program and serious consideration for bariatric surgery  Discharge Diagnoses:  Principal Problem:   Acute pyelonephritis Active Problems:   GERD   Chronic back pain   OSA on CPAP   COPD (chronic obstructive pulmonary disease) (HCC)   Essential hypertension   Chronic respiratory failure with hypoxia (HCC)   Sepsis (HCC)   Chronic diastolic CHF (congestive heart failure) (HCC)   Abdominal pain   PE (pulmonary thromboembolism) ([place])   Pressure injury of skin   Discharge Condition: Guarded  Diet recommendation: Heart healthy  Filed Weights   01/12/19 2201 01/13/19 2048 01/14/19 2023  Weight: (!) 157.1 kg (!) 158.6 kg (!) 158.6 kg    History of present illness:  73 year old obese African-American female, BMI 58 COPD on 4 L of oxygen at baseline, OSA noncompliant on CPAP-uses oxygen at home-->?  Pulmonary HTN dyspnea Prior kidney stone Cardiac cath EF 71-45% with global hypokinesis-seen on cardiac cath 2 519-30% stenosis visualized at that time Hyperlipidemia, HTN, CKD 3, osteoarthritis, prior pulmonary embolism  Patient admitted 5/16 signs of sepsis fever chills back pain-eventually grew E. coli in urine Her back pain work-up revealed new L4-5 central disc protrusion and gabapentin Solu-Cortef seem to help  She  started to complain of chest pain overnight 5/19 and on 5/20 1 AM however it appears that this is not cardiogenic and she has been stabilized for discharge  Hospital Course:  Chest pain patient had 1-2 episodes of what seemed like nonspecific chest pain 5/20 a.m. and 5/20 1 AM I discussed presentation with Dr. Angelena Form and we both agreed that this seemed atypical for angina type pain-I will CC however Dr. Stanford Breed her primary cardiologist to ensure that close follow-up is obtained Did increase to 6.25 from Coreg 3.125 twice daily, Lasix 40 daily   Sepsis secondary to E. coli pyelonephritis Patient was treated since 5/15 with ceftriaxone and transitioned on 5/19 to Bactrim-she will complete a 7-day course on 5/21 Her blood cultures are negative  Acute back pain MRI spine showed disc protrusion and foraminal stenosis-continuing gabapentin 400 3 times daily taper Cortef to prednisone 40 daily Will need significant increase in mobility to prevent further degeneration and will need skilled facility Morning.  will minimize IV morphine and place only on Percocet at this time If she can lose a significant amount of weight this would probably decrease the lordosis from her pendulous breasts and large abdomen and assist with back pain-I would advocate that she is a candidate for bariatric surgery and significant weight loss if she is mentally prepared for the same  OSA, chronic restrictive respiratory failure secondary to morbid obesity habitus, COPD Encouraged the patient to use the BiPAP machine-she only uses oxygen at 4 L at home  Prior pulmonary embolism-continue Eliquis  Super obesity Life-threatening  Sacral decubitus and right heel decubitus ulcers-turn as able-patient to ambulate and sit at  side of bed for meals   Consultations:  Telephone consulted Dr. Angelena Form on 5/21  Discharge Exam: Vitals:   01/15/19 0523 01/15/19 0900  BP: 135/88 (!) 144/69  Pulse: 82 88  Resp: 19  18  Temp: 98 F (36.7 C) 98 F (36.7 C)  SpO2: 91% 93%    General: Awake alert pleasant states that she had some back pain overnight and some chest pain which was nonspecific she said it was in the center of her chest Cardiovascular: S1-S2 no murmur rub or gallop telemetry was reviewed-EKG my read on discharge PR interval 0.12 QRS axis -30 no ST-T wave changes that are new from prior EKGs done PVCs noted sinus rhythm Respiratory: Clinically clear no added sound no rales no rhonchi No lower extremity edema Neurologically intact Musculoskeletal intact  Discharge Instructions   Discharge Instructions    Diet - low sodium heart healthy   Complete by:  As directed    Discharge instructions   Complete by:  As directed    Please look at dosage changes of some of your medications in particular some of your blood pressure medications have increased in dose Because you are having some pain we will prescribe a limited amount of Percocet 5 to 6 tablets but after this is through you will need to contact your primary care physician Noticed that we have increased her gabapentin to help with your musculoskeletal pains   Increase activity slowly   Complete by:  As directed      Allergies as of 01/15/2019      Reactions   Baclofen Other (See Comments)   Causes seizures   Imdur [isosorbide Dinitrate] Other (See Comments)   Severe persistent headache   Naproxen Palpitations      Medication List    STOP taking these medications   pantoprazole 20 MG tablet Commonly known as:  PROTONIX   tiZANidine 2 MG tablet Commonly known as:  ZANAFLEX     TAKE these medications   carvedilol 6.25 MG tablet Commonly known as:  COREG Take 1 tablet (6.25 mg total) by mouth 2 (two) times daily with a meal. What changed:    medication strength  how much to take  how to take this  when to take this  additional instructions   Eliquis 5 MG Tabs tablet Generic drug:  apixaban TAKE 1 TABLET BY  MOUTH TWICE A DAY What changed:  how much to take   furosemide 40 MG tablet Commonly known as:  LASIX TAKE 1 TABLET (40 MG TOTAL) BY MOUTH DAILY.   gabapentin 400 MG capsule Commonly known as:  NEURONTIN Take 1 capsule (400 mg total) by mouth 3 (three) times daily. What changed:  when to take this   ipratropium-albuterol 0.5-2.5 (3) MG/3ML Soln Commonly known as:  DUONEB Take 3 mLs by nebulization 3 (three) times daily.   oxyCODONE-acetaminophen 5-325 MG tablet Commonly known as:  PERCOCET/ROXICET Take 1 tablet by mouth every 4 (four) hours as needed for up to 5 days for moderate pain.   OXYGEN Inhale 4 L into the lungs continuous.   umeclidinium-vilanterol 62.5-25 MCG/INH Aepb Commonly known as:  Anoro Ellipta Inhale 1 puff into the lungs daily.      Allergies  Allergen Reactions  . Baclofen Other (See Comments)    Causes seizures  . Imdur [Isosorbide Dinitrate] Other (See Comments)    Severe persistent headache  . Naproxen Palpitations      The results of significant diagnostics from this hospitalization (including  imaging, microbiology, ancillary and laboratory) are listed below for reference.    Significant Diagnostic Studies: Mr Lumbar Spine W Wo Contrast  Result Date: 01/10/2019 CLINICAL DATA:  Low back pain and lower extremity pain for 3 days EXAM: MRI LUMBAR SPINE WITHOUT AND WITH CONTRAST TECHNIQUE: Multiplanar and multiecho pulse sequences of the lumbar spine were obtained without and with intravenous contrast. CONTRAST:  10 mL Gadavist COMPARISON:  None. FINDINGS: Segmentation: Normal. The lowest disc space is considered to be L5-S1. Alignment:  Normal Vertebrae: No acute compression fracture, discitis-osteomyelitis of focal marrow lesion. Conus medullaris and cauda equina: The conus medullaris terminates at the L2 level. The cauda equina and conus medullaris are both normal. Paraspinal and other soft tissues: The visualized retroperitoneal organs and  paraspinal soft tissues are normal. Disc levels: Sagittal plane imaging includes the T11-12 disc level through the upper sacrum, with axial imaging of the L1-2 to L5-S1 disc levels. T11-L3 levels are normal. L3-4: Right eccentric disc bulge with mild narrowing of the right lateral recess. No central spinal canal stenosis or neural foraminal stenosis. L4-5: Small central disc protrusion superimposed on diffuse mild bulge. Both lateral recesses are narrowed. Mild spinal canal stenosis, worsened since 2015. No foraminal stenosis. L5-S1: Disc space edema. Endplate ridging causes mild-to-moderate bilateral neural foraminal stenosis. No spinal canal stenosis. This level is unchanged. There is no abnormal contrast enhancement. The visualized portion of the sacrum is normal. IMPRESSION: 1. New L4-5 central disc protrusion compared to 01/15/2014, narrowing both lateral recesses. This could contribute to bilateral L5 distribution radiculopathy. 2. Unchanged mild-to-moderate bilateral neural foraminal stenosis at L5-S1, which also could contribute to L5 distribution symptoms. Electronically Signed   By: Ulyses Jarred M.D.   On: 01/10/2019 06:30   Ct Abdomen Pelvis W Contrast  Result Date: 01/10/2019 CLINICAL DATA:  Right-sided abdominal pain. Back pain. Fever. Nausea. EXAM: CT ABDOMEN AND PELVIS WITH CONTRAST TECHNIQUE: Multidetector CT imaging of the abdomen and pelvis was performed using the standard protocol following bolus administration of intravenous contrast. CONTRAST:  135mL OMNIPAQUE IOHEXOL 300 MG/ML  SOLN COMPARISON:  CT 12/02/2016 FINDINGS: Lower chest: Cardiomegaly. Linear atelectasis in both lower lobes. Hepatobiliary: No focal hepatic abnormality. Mild gallbladder distention without pericholecystic inflammation. No calcified gallstone. No biliary dilatation. Pancreas: No ductal dilatation or inflammation. Spleen: Normal in size without focal abnormality. Adrenals/Urinary Tract: No adrenal nodule. Decreased  enhancement of the left upper kidney with progressive atrophy from prior exam. Mild associated perinephric edema in the upper pole. Small calcifications in the left upper kidney, likely combination of parenchymal and renal stones. No hydronephrosis. No right hydronephrosis or perinephric edema. There are 2 simple cysts in the right kidney. Urinary bladder is distended without wall thickening. No bladder stone. Stomach/Bowel: Bowel evaluation is limited in the absence of enteric contrast. Stomach physiologically distended. Duodenal diverticulum about the fourth portion without inflammation. No small bowel obstruction or inflammation. Ascending and transverse colon is distended with air and stool without wall thickening or obstruction. The descending colon is decompressed. Vascular/Lymphatic: Retroperitoneal stranding at the iliac bifurcation without definite vessel wall thickening. Normal caliber abdominal aorta. Mild aortic atherosclerosis. Retroaortic left renal vein. Small retroperitoneal lymph nodes. Reproductive: Uterus and bilateral adnexa are unremarkable. Other: Postsurgical change of the anterior abdominal wall. No ascites. No free air. No intra-abdominal abscess. Musculoskeletal: Retroperitoneal edema level of the iliac bifurcation anterior to L5-S1. There is associated degenerative disc disease but no frank bony destructive change. IMPRESSION: 1. New atrophy and decreased enhancement of the upper left kidney,  with mild perinephric edema. This may be sequela of prior infection or subacute ischemia/infarct. Calcifications in the upper left kidney likely combination of parenchymal calcifications and nonobstructing stone. 2. Retroperitoneal edema the level of the iliac bifurcation anterior to L5-S1. No definite associated vascular abnormality. This can be seen with retroperitoneal fibrosis. Given reported history of back pain, lumbar spine MRI could be considered to exclude possibility of CT occult  discitis/osteomyelitis. 3. Mild gallbladder distention without pericholecystic inflammation. 4. Aortic atherosclerosis (ICD10-I70.0). Electronically Signed   By: Keith Rake M.D.   On: 01/10/2019 02:27   Dg Chest Port 1 View  Result Date: 01/14/2019 CLINICAL DATA:  Chest pain EXAM: PORTABLE CHEST 1 VIEW COMPARISON:  01/09/2019 FINDINGS: Cardiac shadow is enlarged. The lungs are well aerated bilaterally. Mild bibasilar atelectasis is seen. No significant interstitial edema is noted. No pleural effusions are seen. No bony abnormality is noted. IMPRESSION: Bibasilar atelectasis stable from the prior exam. Electronically Signed   By: Inez Catalina M.D.   On: 01/14/2019 07:57   Dg Chest Port 1 View  Result Date: 01/09/2019 CLINICAL DATA:  Fever. Bilateral leg swelling. Chest pain. Shortness of breath. EXAM: PORTABLE CHEST 1 VIEW COMPARISON:  Chest CT 07/28/2018. Most recent radiograph 03/27/2018 FINDINGS: Unchanged cardiomegaly and mediastinal contours. Unchanged bilateral hilar enlargement consistent with enlarged pulmonary arteries as seen on CT. Increased peribronchial thickening from prior exam. No focal airspace disease. Minimal scarring in the left lung. No pleural fluid or pneumothorax IMPRESSION: 1. Increased peribronchial thickening from prior exam may be pulmonary edema or bronchitis. 2. Stable cardiomegaly and enlarged pulmonary arteries. Electronically Signed   By: Keith Rake M.D.   On: 01/09/2019 22:42    Microbiology: Recent Results (from the past 240 hour(s))  Blood Culture (routine x 2)     Status: None   Collection Time: 01/09/19 10:12 PM  Result Value Ref Range Status   Specimen Description BLOOD BLOOD LEFT FOREARM  Final   Special Requests   Final    BOTTLES DRAWN AEROBIC AND ANAEROBIC Blood Culture adequate volume   Culture   Final    NO GROWTH 5 DAYS Performed at [facility], [address], [place], [place] [address]    Report Status 01/14/2019 FINAL  Final   Blood Culture (routine x 2)     Status: None   Collection Time: 01/09/19 10:12 PM  Result Value Ref Range Status   Specimen Description BLOOD RIGHT ANTECUBITAL  Final   Special Requests   Final    BOTTLES DRAWN AEROBIC ONLY Blood Culture results may not be optimal due to an inadequate volume of blood received in culture bottles   Culture   Final    NO GROWTH 5 DAYS Performed at [facility], [place] [address] [place],  [address]    Report Status 01/14/2019 FINAL  Final  SARS Coronavirus 2 (CEPHEID - Performed in [place] hospital lab), Hosp Order     Status: None   Collection Time: 01/09/19 11:36 PM  Result Value Ref Range Status   SARS Coronavirus 2 NEGATIVE NEGATIVE Final    Comment: (NOTE) If result is NEGATIVE SARS-CoV-2 target nucleic acids are NOT DETECTED. The SARS-CoV-2 RNA is generally detectable in upper and lower  respiratory specimens during the acute phase of infection. The lowest  concentration of SARS-CoV-2 viral copies this assay can detect is 250  copies / mL. A negative result does not preclude SARS-CoV-2 infection  and should not be used as the sole  basis for treatment or other  patient management decisions.  A negative result may occur with  improper specimen collection / handling, submission of specimen other  than nasopharyngeal swab, presence of viral mutation(s) within the  areas targeted by this assay, and inadequate number of viral copies  (<250 copies / mL). A negative result must be combined with clinical  observations, patient history, and epidemiological information. If result is POSITIVE SARS-CoV-2 target nucleic acids are DETECTED. The SARS-CoV-2 RNA is generally detectable in upper and lower  respiratory specimens dur ing the acute phase of infection.  Positive  results are indicative of active infection with SARS-CoV-2.  Clinical  correlation with patient history and other diagnostic information is  necessary to determine patient  infection status.  Positive results do  not rule out bacterial infection or co-infection with other viruses. If result is PRESUMPTIVE POSTIVE SARS-CoV-2 nucleic acids MAY BE PRESENT.   A presumptive positive result was obtained on the submitted specimen  and confirmed on repeat testing.  While 2019 novel coronavirus  (SARS-CoV-2) nucleic acids may be present in the submitted sample  additional confirmatory testing may be necessary for epidemiological  and / or clinical management purposes  to differentiate between  SARS-CoV-2 and other Sarbecovirus currently known to infect humans.  If clinically indicated additional testing with an alternate test  methodology [phone]) is advised. The SARS-CoV-2 RNA is generally  detectable in upper and lower respiratory sp ecimens during the acute  phase of infection. The expected result is Negative. Fact Sheet for Patients:  StrictlyIdeas.no Fact Sheet for Healthcare Providers: BankingDealers.co.za This test is not yet approved or cleared by the [place] FDA and has been authorized for detection and/or diagnosis of SARS-CoV-2 by FDA under an Emergency Use Authorization (EUA).  This EUA will remain in effect (meaning this test can be used) for the duration of the COVID-19 declaration under Section 564(b)(1) of the Act, 21 U.S.C. section 360bbb-3(b)(1), unless the authorization is terminated or revoked sooner. Performed at [facility], [place] [address], [place], [place] [address]   Urine culture     Status: Abnormal   Collection Time: 01/10/19  2:05 AM  Result Value Ref Range Status   Specimen Description URINE, CATHETERIZED  Final   Special Requests   Final    NONE Performed at [facility], [place] [address], [place],  [address]    Culture >=100,000 COLONIES/mL ESCHERICHIA COLI (A)  Final   Report Status 01/12/2019 FINAL  Final   Organism ID, Bacteria ESCHERICHIA COLI (A)   Final      Susceptibility   Escherichia coli - MIC*    AMPICILLIN 8 SENSITIVE Sensitive     CEFAZOLIN <=4 SENSITIVE Sensitive     CEFTRIAXONE <=1 SENSITIVE Sensitive     CIPROFLOXACIN <=0.25 SENSITIVE Sensitive     GENTAMICIN <=1 SENSITIVE Sensitive     IMIPENEM <=0.25 SENSITIVE Sensitive     NITROFURANTOIN 32 SENSITIVE Sensitive     TRIMETH/SULFA <=20 SENSITIVE Sensitive     AMPICILLIN/SULBACTAM 4 SENSITIVE Sensitive     PIP/TAZO <=4 SENSITIVE Sensitive     Extended ESBL NEGATIVE Sensitive     * >=100,000 COLONIES/mL ESCHERICHIA COLI     Labs: Basic Metabolic Panel: Recent Labs  Lab 01/09/19 2211 01/10/19 0447 01/12/19 0311 01/15/19 0401  NA 137 138 138 139  K 4.5 4.1 4.7 4.8  CL 102 101 100 102  CO2 22 26 29  33*  GLUCOSE 125* 135* 130* 96  BUN  25* 21 18 22   CREATININE 1.16* 1.06* 0.91 1.01*  CALCIUM 9.4 9.2 9.1 9.1   Liver Function Tests: Recent Labs  Lab 01/09/19 2211  AST 27  ALT 25  ALKPHOS 69  BILITOT 1.1  PROT 8.0  ALBUMIN 3.6   Recent Labs  Lab 01/10/19 0447  LIPASE 53*   No results for input(s): AMMONIA in the last 168 hours. CBC: Recent Labs  Lab 01/10/19 0059 01/10/19 0447 01/12/19 0311 01/15/19 0401  WBC 13.9* 13.0* 11.5* 9.6  NEUTROABS 10.7*  --  9.1* 6.4  HGB 13.9 13.2 13.3 13.2  HCT 46.7* 44.8 45.1 45.4  MCV 90.9 91.1 91.5 92.3  PLT 145* 149* 172 199   Cardiac Enzymes: Recent Labs  Lab 01/10/19 1335 01/14/19 0330 01/14/19 0804  TROPONINI <0.03 <0.03 <0.03   BNP: BNP (last 3 results) Recent Labs    03/27/18 1138 07/28/18 1619 01/10/19 0447  BNP 44.8 43.5 97.5    ProBNP (last 3 results) No results for input(s): PROBNP in the last 8760 hours.  CBG: Recent Labs  Lab 01/13/19 0724 01/14/19 0647  GLUCAP 151* 177*       Signed:  Nita Sells MD   [facility] 01/15/2019, 10:23 AM

## 2019-01-15 NOTE — ED Triage Notes (Signed)
Pt arrives via EMS from home (hotel) with reports of slipped out of bed and caught herself. Pt was discharged home from here today and taken home by [facility]. Pt was supposed to go to rehab. Hx of chronic back and leg pain. Pt has bedbugs

## 2019-01-15 NOTE — Progress Notes (Signed)
Physical Therapy Treatment Patient Details Name: Janice Morrow MRN: [id number] DOB: 1946/08/02 Today's Date: 01/15/2019    History of Present Illness Janice Morrow is a 73 y.o. female with medical history significant of hypertension, hyperlipidemia, COPD on 4 L oxygen, dCHF, GERD, migraine, OSA on CPAP, morbid obesity, kidney stone, CKD 3, who presents with abdominal pain, back pain, fever, chills, dysuria.  Leukocytosis, positive UA and suspected left pyelonephritis on CT scan.  Admitted for acute pyelonephritis.    PT Comments    Patient seen today, improved bed mobility to min A to come to sitting, max A to bring back to bed and use of [place] to position back in bed. Came to sitting and c/o of dizziness with posterior lean back onto bed. VSS on 4L. Unsafe to attempt standing this session.  Pt adamantly denying SNF although that is still the recommendation of this physical therapist. Attempted to educate her on benefits however, unsuccessful. Noted in chart patient will have home first program.    Follow Up Recommendations  SNF;Supervision/Assistance - 24 hour     Equipment Recommendations  Rolling walker with 5" wheels;3in1 (PT);Wheelchair (measurements PT);Other (comment)(Bariatric)    Recommendations for Other Services       Precautions / Restrictions Precautions Precautions: Fall    Mobility  Bed Mobility Overal bed mobility: Needs Assistance Bed Mobility: Supine to Sit;Sit to Supine     Supine to sit: Min assist;+2 for physical assistance Sit to supine: Max assist;+2 for physical assistance   General bed mobility comments: coming to sitting wtih min A, fatigued by effort. reports dizziness. VSS. unable to attempt standing safely. max A to bring legs back into bed  Transfers                    Ambulation/Gait                 Stairs             Wheelchair Mobility    Modified Rankin (Stroke Patients Only)       Balance                                             Cognition Arousal/Alertness: Awake/alert Behavior During Therapy: WFL for tasks assessed/performed;Anxious Overall Cognitive Status: Difficult to assess                                 General Comments: Very internally distracted by pain, and having difficulty answering questions re: PLOF      Exercises      General Comments        Pertinent Vitals/Pain Pain Assessment: Faces Faces Pain Scale: Hurts even more Pain Location: Back pain and generalized pain with movement Pain Descriptors / Indicators: Grimacing;Crying    Home Living                      Prior Function            PT Goals (current goals can now be found in the care plan section) Acute Rehab PT Goals Patient Stated Goal: Did not state PT Goal Formulation: Patient unable to participate in goal setting Time For Goal Achievement: 01/26/19 Potential to Achieve Goals: Fair    Frequency    Min 2X/week  PT Plan Current plan remains appropriate    Co-evaluation              AM-PAC PT "6 Clicks" Mobility   Outcome Measure  Help needed turning from your back to your side while in a flat bed without using bedrails?: A Lot Help needed moving from lying on your back to sitting on the side of a flat bed without using bedrails?: A Lot Help needed moving to and from a bed to a chair (including a wheelchair)?: Total Help needed standing up from a chair using your arms (e.g., wheelchair or bedside chair)?: Total Help needed to walk in hospital room?: Total Help needed climbing 3-5 steps with a railing? : Total 6 Click Score: 8    End of Session Equipment Utilized During Treatment: Oxygen Activity Tolerance: Patient limited by pain Patient left: in bed;with call bell/phone within reach;with nursing/sitter in room;Other (comment)(bed in chair position) Nurse Communication: Mobility status;Need for lift equipment PT Visit  Diagnosis: Other abnormalities of gait and mobility (R26.89);Muscle weakness (generalized) (M62.81);Pain Pain - Right/Left: (back) Pain - part of body: (back)     Time: 1105-1120 PT Time Calculation (min) (ACUTE ONLY): 15 min  Charges:  $Therapeutic Activity: 8-22 mins                     Reinaldo Berber, PT, DPT Acute Rehabilitation Services Pager: [phone] Office: [phone]     Reinaldo Berber 01/15/2019, 11:32 AM

## 2019-01-16 ENCOUNTER — Ambulatory Visit: Payer: Medicare Other

## 2019-01-16 ENCOUNTER — Ambulatory Visit: Payer: Medicare Other | Admitting: Physical Medicine & Rehabilitation

## 2019-01-16 ENCOUNTER — Telehealth: Payer: Self-pay

## 2019-01-16 DIAGNOSIS — I10 Essential (primary) hypertension: Secondary | ICD-10-CM | POA: Diagnosis not present

## 2019-01-16 DIAGNOSIS — R0789 Other chest pain: Secondary | ICD-10-CM | POA: Diagnosis not present

## 2019-01-16 DIAGNOSIS — G894 Chronic pain syndrome: Secondary | ICD-10-CM | POA: Diagnosis not present

## 2019-01-16 DIAGNOSIS — I5032 Chronic diastolic (congestive) heart failure: Secondary | ICD-10-CM | POA: Diagnosis not present

## 2019-01-16 DIAGNOSIS — R569 Unspecified convulsions: Secondary | ICD-10-CM | POA: Diagnosis not present

## 2019-01-16 DIAGNOSIS — Z9989 Dependence on other enabling machines and devices: Secondary | ICD-10-CM | POA: Diagnosis not present

## 2019-01-16 DIAGNOSIS — M6281 Muscle weakness (generalized): Secondary | ICD-10-CM | POA: Diagnosis not present

## 2019-01-16 DIAGNOSIS — M25561 Pain in right knee: Secondary | ICD-10-CM | POA: Diagnosis not present

## 2019-01-16 DIAGNOSIS — M79662 Pain in left lower leg: Secondary | ICD-10-CM | POA: Diagnosis not present

## 2019-01-16 DIAGNOSIS — N10 Acute tubulo-interstitial nephritis: Secondary | ICD-10-CM | POA: Diagnosis not present

## 2019-01-16 DIAGNOSIS — S8001XA Contusion of right knee, initial encounter: Secondary | ICD-10-CM | POA: Diagnosis not present

## 2019-01-16 DIAGNOSIS — M170 Bilateral primary osteoarthritis of knee: Secondary | ICD-10-CM | POA: Diagnosis not present

## 2019-01-16 DIAGNOSIS — W19XXXA Unspecified fall, initial encounter: Secondary | ICD-10-CM | POA: Diagnosis not present

## 2019-01-16 DIAGNOSIS — R52 Pain, unspecified: Secondary | ICD-10-CM | POA: Diagnosis not present

## 2019-01-16 DIAGNOSIS — R0902 Hypoxemia: Secondary | ICD-10-CM | POA: Diagnosis not present

## 2019-01-16 DIAGNOSIS — W19XXXS Unspecified fall, sequela: Secondary | ICD-10-CM | POA: Diagnosis not present

## 2019-01-16 DIAGNOSIS — R5381 Other malaise: Secondary | ICD-10-CM | POA: Diagnosis not present

## 2019-01-16 DIAGNOSIS — M79605 Pain in left leg: Secondary | ICD-10-CM | POA: Diagnosis not present

## 2019-01-16 DIAGNOSIS — G4733 Obstructive sleep apnea (adult) (pediatric): Secondary | ICD-10-CM | POA: Diagnosis not present

## 2019-01-16 DIAGNOSIS — I5033 Acute on chronic diastolic (congestive) heart failure: Secondary | ICD-10-CM | POA: Diagnosis not present

## 2019-01-16 DIAGNOSIS — M545 Low back pain: Secondary | ICD-10-CM | POA: Diagnosis not present

## 2019-01-16 DIAGNOSIS — B888 Other specified infestations: Secondary | ICD-10-CM | POA: Diagnosis not present

## 2019-01-16 DIAGNOSIS — Z1159 Encounter for screening for other viral diseases: Secondary | ICD-10-CM | POA: Diagnosis not present

## 2019-01-16 DIAGNOSIS — Z7401 Bed confinement status: Secondary | ICD-10-CM | POA: Diagnosis not present

## 2019-01-16 DIAGNOSIS — J9611 Chronic respiratory failure with hypoxia: Secondary | ICD-10-CM | POA: Diagnosis not present

## 2019-01-16 DIAGNOSIS — E785 Hyperlipidemia, unspecified: Secondary | ICD-10-CM | POA: Diagnosis not present

## 2019-01-16 DIAGNOSIS — M1990 Unspecified osteoarthritis, unspecified site: Secondary | ICD-10-CM | POA: Diagnosis not present

## 2019-01-16 DIAGNOSIS — R601 Generalized edema: Secondary | ICD-10-CM | POA: Diagnosis not present

## 2019-01-16 DIAGNOSIS — M2550 Pain in unspecified joint: Secondary | ICD-10-CM | POA: Diagnosis not present

## 2019-01-16 DIAGNOSIS — K219 Gastro-esophageal reflux disease without esophagitis: Secondary | ICD-10-CM | POA: Diagnosis not present

## 2019-01-16 DIAGNOSIS — E118 Type 2 diabetes mellitus with unspecified complications: Secondary | ICD-10-CM | POA: Diagnosis not present

## 2019-01-16 DIAGNOSIS — R0989 Other specified symptoms and signs involving the circulatory and respiratory systems: Secondary | ICD-10-CM | POA: Diagnosis not present

## 2019-01-16 DIAGNOSIS — G8929 Other chronic pain: Secondary | ICD-10-CM | POA: Diagnosis not present

## 2019-01-16 DIAGNOSIS — A419 Sepsis, unspecified organism: Secondary | ICD-10-CM | POA: Diagnosis not present

## 2019-01-16 DIAGNOSIS — Z6841 Body Mass Index (BMI) 40.0 and over, adult: Secondary | ICD-10-CM | POA: Diagnosis not present

## 2019-01-16 DIAGNOSIS — R5383 Other fatigue: Secondary | ICD-10-CM | POA: Diagnosis not present

## 2019-01-16 DIAGNOSIS — I2782 Chronic pulmonary embolism: Secondary | ICD-10-CM | POA: Diagnosis not present

## 2019-01-16 DIAGNOSIS — M62838 Other muscle spasm: Secondary | ICD-10-CM | POA: Diagnosis not present

## 2019-01-16 DIAGNOSIS — M549 Dorsalgia, unspecified: Secondary | ICD-10-CM | POA: Diagnosis not present

## 2019-01-16 DIAGNOSIS — R262 Difficulty in walking, not elsewhere classified: Secondary | ICD-10-CM | POA: Diagnosis not present

## 2019-01-16 DIAGNOSIS — F419 Anxiety disorder, unspecified: Secondary | ICD-10-CM | POA: Diagnosis not present

## 2019-01-16 DIAGNOSIS — J449 Chronic obstructive pulmonary disease, unspecified: Secondary | ICD-10-CM | POA: Diagnosis not present

## 2019-01-16 DIAGNOSIS — M79652 Pain in left thigh: Secondary | ICD-10-CM | POA: Diagnosis not present

## 2019-01-16 DIAGNOSIS — G4700 Insomnia, unspecified: Secondary | ICD-10-CM | POA: Diagnosis not present

## 2019-01-16 DIAGNOSIS — M9963 Osseous and subluxation stenosis of intervertebral foramina of lumbar region: Secondary | ICD-10-CM | POA: Diagnosis not present

## 2019-01-16 DIAGNOSIS — R05 Cough: Secondary | ICD-10-CM | POA: Diagnosis not present

## 2019-01-16 DIAGNOSIS — M25552 Pain in left hip: Secondary | ICD-10-CM | POA: Diagnosis not present

## 2019-01-16 DIAGNOSIS — M25562 Pain in left knee: Secondary | ICD-10-CM | POA: Diagnosis not present

## 2019-01-16 DIAGNOSIS — R0602 Shortness of breath: Secondary | ICD-10-CM | POA: Diagnosis not present

## 2019-01-16 LAB — SARS CORONAVIRUS 2 BY RT PCR (HOSPITAL ORDER, PERFORMED IN ~~LOC~~ HOSPITAL LAB): SARS Coronavirus 2: NEGATIVE

## 2019-01-16 NOTE — ED Notes (Signed)
Report attempted x2 to [facility].  Not able to give verbal report.  Facility is aware of pt coming to them.

## 2019-01-16 NOTE — ED Notes (Signed)
RN attempted to call [facility] a third time, answered by receptionist, again no RN answered phone to take repot. Per CSW note, facility aware pt is coming.

## 2019-01-16 NOTE — Progress Notes (Addendum)
1:25pm: CSW spoke with patient's RN to discuss a time for discharge. RN and CSW agreed upon 2:30pm for transport. CSW scheduled for PTAR to arrive at [facility] ED at 2:30pm for transportation to [facility].  12:50pm: CSW received confirmation from [place] at [facility] that the facility has a bed that will accommodate the patient's size. CSW awaiting COVID results and will initiate transfer to [facility] at that time. CSW updated patient's brother [name] of plan.  11am: CSW spoke with Juliann Pulse at [facility] and the patient has been accepted into that facility. Facility is requesting a recent COVID result and documented temperatures for this patient prior to her being transferred there. Juliann Pulse provided CSW with contact information for the admissions coverage person today which is Blanch Media at [phone]. CSW spoke with Blanch Media at [facility] to discuss patient. Blanch Media will obtain bariatric bed for patient and will return CSW call whenever bed is secured.  CSW notified RN of request and she is agreeable and will complete steps needed for placement.   Madilyn Fireman, MSW, LCSW-A Clinical Social Worker [facility] [phone]

## 2019-01-16 NOTE — ED Notes (Signed)
Patient verbalizes understanding of discharge instructions. Opportunity for questioning and answers were provided. Armband removed by staff, pt discharged from ED.  

## 2019-01-16 NOTE — NC FL2 (Addendum)
[place] LEVEL OF CARE SCREENING TOOL     IDENTIFICATION  Patient Name: Janice Morrow Birthdate: 10-31-1945 Sex: female Admission Date (Current Location): 01/15/2019  [place] and [place] Number:  Janice Morrow [id number] [place] and Address:  [facility] [place] [place] [address] [place], [place] [address]      Provider Number: [id number]  Attending Physician Name and Address:  Default, Provider, MD  Relative Name and Phone Number:  [name] [phone]    Current Level of Care: Hospital Recommended Level of Care: [place] Prior Approval Number:    Date Approved/Denied:   PASRR Number: [id number] (MUST ID #[id number])  Discharge Plan: SNF    Current Diagnoses: Patient Active Problem List   Diagnosis Date Noted  . Pressure injury of skin 01/14/2019  . Acute pyelonephritis 01/10/2019  . Sepsis ([place]) 01/10/2019  . Chronic diastolic CHF (congestive heart failure) ([place]) 01/10/2019  . Abdominal pain 01/10/2019  . PE (pulmonary thromboembolism) ([place]) 01/10/2019  . Mild renal insufficiency 07/28/2018  . Chronic respiratory failure with hypoxia ([place])   . Insomnia secondary to chronic pain 02/03/2018  . Cervical disc disease with myelopathy 01/28/2018  . Primary osteoarthritis of both knees 10/14/2017  . Type 2 diabetes mellitus with complication, without long-term current use of insulin ([place]) 05/20/2017  . Kidney stone on left side   . COPD (chronic obstructive pulmonary disease) ([place]) 08/24/2015  . Essential hypertension 08/24/2015  . Seizures ([place]) 08/24/2015  . Obesity hypoventilation syndrome ([place]) 08/24/2015  . Migraine without aura and with status migrainosus, not intractable 07/12/2015  . OSA on CPAP 07/12/2015  . Left ventricular diastolic dysfunction, NYHA class 1 04/11/2015  . Visit for screening mammogram 03/08/2015  . Trigeminal neuralgia of right side of face 11/23/2014  . Chronic back pain 11/26/2013  .  Hyperlipidemia with target LDL less than 130 09/01/2010  . Severe obesity (BMI >= 40) ([place]) 09/01/2010  . GERD 09/01/2010    Orientation RESPIRATION BLADDER Height & Weight     Self, Time, Place  O2(4 liters oxygen, patient has OSA and no longer uses CPAP per H & P) Continent Weight:   Height:     BEHAVIORAL SYMPTOMS/MOOD NEUROLOGICAL BOWEL NUTRITION STATUS    Convulsions/Seizures Continent Diet(Heart Healthy)  AMBULATORY STATUS COMMUNICATION OF NEEDS Skin   Total Care Verbally Other (Comment)(Ecchymosis\-right/left breast, cracking right/left foot, serous blister-right heel with foam dressing; stage 2 pressure injury to buttocks, stage 2 pressure injury right posterior proximal thigh)                       Personal Care Assistance Level of Assistance  Bathing, Feeding, Dressing Bathing Assistance: Maximum assistance Feeding assistance: Independent Dressing Assistance: Maximum assistance     Functional Limitations Info  Sight, Hearing, Speech Sight Info: Adequate Hearing Info: Adequate Speech Info: Adequate    SPECIAL CARE FACTORS FREQUENCY  OT (By licensed OT), PT (By licensed PT)     PT Frequency: 5x weekly OT Frequency: 5x weekly            Contractures Contractures Info: Not present    Additional Factors Info  Code Status Code Status Info: Full Allergies Info: Baclofen, Indur, Naproxen           Current Medications (01/16/2019):  This is the current hospital active medication list Current Facility-Administered Medications  Medication Dose Route Frequency Provider Last Rate Last Dose  . apixaban (ELIQUIS) tablet 5 mg  5 mg Oral BID Isla Pence,  MD   5 mg at 01/16/19 0753  . carvedilol (COREG) tablet 6.25 mg  6.25 mg Oral BID WC Isla Pence, MD   6.25 mg at 01/16/19 0755  . gabapentin (NEURONTIN) capsule 400 mg  400 mg Oral TID Isla Pence, MD   400 mg at 01/16/19 0223  . oxyCODONE-acetaminophen (PERCOCET/ROXICET) 5-325 MG per tablet 1 tablet   1 tablet Oral Q4H PRN Isla Pence, MD   1 tablet at 01/16/19 7893   Current Outpatient Medications  Medication Sig Dispense Refill  . carvedilol (COREG) 6.25 MG tablet Take 1 tablet (6.25 mg total) by mouth 2 (two) times daily with a meal. 60 tablet 3  . ELIQUIS 5 MG TABS tablet TAKE 1 TABLET BY MOUTH TWICE A DAY (Patient taking differently: Take 5 mg by mouth 2 (two) times daily. ) 60 tablet 3  . furosemide (LASIX) 40 MG tablet TAKE 1 TABLET (40 MG TOTAL) BY MOUTH DAILY. (Patient not taking: Reported on 01/10/2019) 90 tablet 1  . gabapentin (NEURONTIN) 400 MG capsule Take 1 capsule (400 mg total) by mouth 3 (three) times daily. 30 capsule 0  . ipratropium-albuterol (DUONEB) 0.5-2.5 (3) MG/3ML SOLN Take 3 mLs by nebulization 3 (three) times daily. (Patient not taking: Reported on 01/10/2019) 360 mL 3  . oxyCODONE-acetaminophen (PERCOCET/ROXICET) 5-325 MG tablet Take 1 tablet by mouth every 4 (four) hours as needed for up to 5 days for moderate pain. 10 tablet 0  . OXYGEN Inhale 4 L into the lungs continuous.     [name] umeclidinium-vilanterol (ANORO ELLIPTA) 62.5-25 MCG/INH AEPB Inhale 1 puff into the lungs daily. (Patient not taking: Reported on 01/10/2019) 90 each 1     Discharge Medications: Please see discharge summary for a list of discharge medications.  Relevant Imaging Results:  Relevant Lab Results:   Additional Information SSN: [id number], patient weighs 346lbs.   Patient is on contact precautions due to bed bugs.  Archie Endo, LCSW

## 2019-01-16 NOTE — TOC Initial Note (Addendum)
Transition of Care [facility]) - Initial/Assessment Note    Patient Details  Name: Janice Morrow MRN: [id number] Date of Birth: 01/15/1946  Transition [facility] Care [facility]) CM/SW Contact:    Archie Endo, LCSW Phone Number: 01/16/2019, 10:14 AM  Clinical Narrative:                  CSW attempted to meet with patient at bedside however patient is on droplet precautions and there is a barrier at the door to prevent the spread of bed bugs from the patient's room to others. CSW attempted to call into patient's room three times without an answer. CSW spoke with patient's brother Janice Morrow to complete assessment. CSW presented Janice Morrow with the two bed offers that the patient has received including [place] and [facility]. Janice Morrow chose to accept a bed at [facility].  CSW left message with Janice Morrow at [facility] requesting a return call to discuss bed availability.  CSW spoke with patient's RN Janice Morrow [phone]) to inform her of plan, RN agreeable and CSW will keep her updated with new information.  Expected Discharge Plan: Skilled Nursing Facility Barriers to Discharge: No Barriers Identified   Patient Goals and CMS Choice Patient states their goals for this hospitalization and ongoing recovery are:: Get rehab CMS Medicare.gov Compare Post Acute Care list provided to:: Other (Comment Required)(Patient's brother Janice Morrow) Choice offered to / list presented to : Sibling  Expected Discharge Plan and Services Expected Discharge Plan: [place] Choice: [place] arrangements for the past 2 months: Single Family Home                                      Prior Living Arrangements/Services Living arrangements for the past 2 months: Single Family Home Lives with:: Self Patient language and need for interpreter reviewed:: No Do you feel safe going back to the place where you live?: Yes      Need for Family  Participation in Patient Care: No (Comment) Care giver support system in place?: Yes (comment)   Criminal Activity/Legal Involvement Pertinent to Current Situation/Hospitalization: No - Comment as needed  Activities of Daily Living      Permission Sought/Granted Permission sought to share information with : Family Supports Permission granted to share information with : Yes, Verbal Permission Granted  Share Information with NAME: Janice Morrow  Permission granted to share info w AGENCY: SNF facilities for referral  Permission granted to share info w Relationship: Brother  Permission granted to share info w Contact Information: [phone]  Emotional Assessment     Affect (typically observed): Accepting Orientation: : Oriented to Self, Oriented to Place, Oriented to  Time, Oriented to Situation Alcohol / Substance Use: Not Applicable Psych Involvement: No (comment)  Admission diagnosis:  fall Patient Active Problem List   Diagnosis Date Noted  . Pressure injury of skin 01/14/2019  . Acute pyelonephritis 01/10/2019  . Sepsis ([place]) 01/10/2019  . Chronic diastolic CHF (congestive heart failure) ([place]) 01/10/2019  . Abdominal pain 01/10/2019  . PE (pulmonary thromboembolism) ([place]) 01/10/2019  . Mild renal insufficiency 07/28/2018  . Chronic respiratory failure with hypoxia ([place])   . Insomnia secondary to chronic pain 02/03/2018  . Cervical disc disease with myelopathy 01/28/2018  . Primary osteoarthritis of both knees 10/14/2017  . Type 2 diabetes mellitus with complication, without long-term current use of insulin ([place])  05/20/2017  . Kidney stone on left side   . COPD (chronic obstructive pulmonary disease) ([place]) 08/24/2015  . Essential hypertension 08/24/2015  . Seizures ([place]) 08/24/2015  . Obesity hypoventilation syndrome ([place]) 08/24/2015  . Migraine without aura and with status migrainosus, not intractable 07/12/2015  . OSA on CPAP 07/12/2015  . Left ventricular diastolic  dysfunction, NYHA class 1 04/11/2015  . Visit for screening mammogram 03/08/2015  . Trigeminal neuralgia of right side of face 11/23/2014  . Chronic back pain 11/26/2013  . Hyperlipidemia with target LDL less than 130 09/01/2010  . Severe obesity (BMI >= 40) ([place]) 09/01/2010  . GERD 09/01/2010   PCP:  Janith Lima, MD Pharmacy:   [id number] [place], [place] [address] Phone: [phone] Fax: [phone]  [facility] [place] [place] - [address] [place] [address] Phone: [phone] Fax: [phone]  [id number] [place], [place] [address] Phone: [phone] Fax: [phone]     Social Determinants of Health (SDOH) Interventions    Readmission Risk Interventions No flowsheet data found.

## 2019-01-16 NOTE — Telephone Encounter (Signed)
Called pt and spoke to pt brother Ashonte Angelucci [facility]).  He stated that pt did agree to go to a Rehab facility.

## 2019-01-16 NOTE — ED Notes (Signed)
Heart healthy lunch tray ordered 

## 2019-01-16 NOTE — ED Provider Notes (Signed)
X-ray negative.  Labs reviewed.  Patient will wait overnight for SNF placement Already seen by social work.   Ripley Fraise, MD 01/16/19 Dyann Kief

## 2019-01-16 NOTE — ED Notes (Signed)
PTAR given all paperwork. Pt to be transported by PTAR to [facility].

## 2019-01-16 NOTE — ED Notes (Signed)
Ordered breakfast 

## 2019-01-16 NOTE — TOC Transition Note (Signed)
Transition of Care [facility]) - CM/SW Discharge Note   Patient Details  Name: Janice Morrow MRN: [id number] Date of Birth: 19-Nov-1945  Transition of Care [facility]) CM/SW Contact:  Archie Endo, LCSW Phone Number: 01/16/2019, 1:42 PM   Clinical Narrative:     Patient is going to room 130B at [facility]. The number to call for report is [phone]. PTAR was arranged for transport at 2:30pm. Patient's brother made aware of plan.  Final next level of care: Skilled Nursing Facility Barriers to Discharge: No Barriers Identified   Patient Goals and CMS Choice Patient states their goals for this hospitalization and ongoing recovery are:: Get rehab CMS Medicare.gov Compare Post Acute Care list provided to:: Other (Comment Required)(Patient's brother [name]) Choice offered to / list presented to : Sibling  Discharge Placement              Patient chooses bed at: [facility] Patient to be transferred to facility by: [place] Name of family member notified: Tereasa Coop Patient and family notified of of transfer: 01/16/19  Discharge Plan and Services     Post Acute Care Choice: [place]                               Social Determinants of Health (SDOH) Interventions     Readmission Risk Interventions No flowsheet data found.

## 2019-01-16 NOTE — ED Notes (Addendum)
Per social work, pt needs temperature taken and rapid coronavirus test before pt can be placed. Vanita Panda, MD notified.

## 2019-01-22 ENCOUNTER — Other Ambulatory Visit: Payer: Self-pay | Admitting: *Deleted

## 2019-01-22 DIAGNOSIS — M25562 Pain in left knee: Secondary | ICD-10-CM | POA: Diagnosis not present

## 2019-01-22 DIAGNOSIS — M62838 Other muscle spasm: Secondary | ICD-10-CM | POA: Diagnosis not present

## 2019-01-22 DIAGNOSIS — M25561 Pain in right knee: Secondary | ICD-10-CM | POA: Diagnosis not present

## 2019-01-22 DIAGNOSIS — G8929 Other chronic pain: Secondary | ICD-10-CM | POA: Diagnosis not present

## 2019-01-22 NOTE — Patient Outreach (Signed)
Writer attended telephonic IDT meeting with [facility] SN[facility] facility staff, [facility] UM RN, and [facility] UM MD Director.   Ms. Myer is currently at [facility] SNF receiving rehab therapy. She is being assessed for [facility] Care Management needs due to her [employer] Medicare insurance.   Discussed that returning to the [facility] is not a safe discharge plan for member. Facility dc planner aware of member's living situation.  Discussed that it was documented in hospital records that her brother is unable to care for her. Facility discharge planner confirms member also has Medicaid. Discussed that the safest option for member is stay at a facility for long term care.   Writer plans to follow up with member/brother regarding disposition plans. Discussed that there is a mobile number listed in chart records for contact.     [name], MSN-Ed, RN,BSN [place] Acute Care Coordinator [phone]

## 2019-01-28 DIAGNOSIS — M25562 Pain in left knee: Secondary | ICD-10-CM | POA: Diagnosis not present

## 2019-01-28 DIAGNOSIS — R52 Pain, unspecified: Secondary | ICD-10-CM | POA: Diagnosis not present

## 2019-01-28 DIAGNOSIS — M25561 Pain in right knee: Secondary | ICD-10-CM | POA: Diagnosis not present

## 2019-01-28 DIAGNOSIS — M62838 Other muscle spasm: Secondary | ICD-10-CM | POA: Diagnosis not present

## 2019-01-28 DIAGNOSIS — G8929 Other chronic pain: Secondary | ICD-10-CM | POA: Diagnosis not present

## 2019-01-30 ENCOUNTER — Other Ambulatory Visit: Payer: Self-pay | Admitting: *Deleted

## 2019-01-30 NOTE — Patient Outreach (Addendum)
Writer continues to follow member while at [facility] as a benefit of her [employer].  Member remains at [facility] for rehab therapy.   Discussed in telephonic in IDT meeting on yesterday that writer will outreach to Janice Morrow while in SNF to discuss safer discharge plan is stay in a facility for long term. Discharge planner agreeable to this. However, member does not have a cell phone on her. Telephone number in system appears to be her brother's number. Will coordinate with facility staff to speak with member on one of their phones at the facility. Due to covid restrictions, Probation officer is unable to meet member at bedside at facility.  Notification sent to discharge planner to request date and time that this can be arranged.   Will continue to collaborate with [facility] UM team and facility staff.   Will plan outreach to member once information requested information received from discharge planner.    [name], MSN-Ed, RN,BSN [facility] Liaison [phone]

## 2019-02-02 DIAGNOSIS — R601 Generalized edema: Secondary | ICD-10-CM | POA: Diagnosis not present

## 2019-02-02 DIAGNOSIS — R5383 Other fatigue: Secondary | ICD-10-CM | POA: Diagnosis not present

## 2019-02-02 DIAGNOSIS — I5033 Acute on chronic diastolic (congestive) heart failure: Secondary | ICD-10-CM | POA: Diagnosis not present

## 2019-02-02 DIAGNOSIS — R0602 Shortness of breath: Secondary | ICD-10-CM | POA: Diagnosis not present

## 2019-02-02 DIAGNOSIS — J9611 Chronic respiratory failure with hypoxia: Secondary | ICD-10-CM | POA: Diagnosis not present

## 2019-02-03 DIAGNOSIS — M549 Dorsalgia, unspecified: Secondary | ICD-10-CM | POA: Diagnosis not present

## 2019-02-03 DIAGNOSIS — I10 Essential (primary) hypertension: Secondary | ICD-10-CM | POA: Diagnosis not present

## 2019-02-03 DIAGNOSIS — I5032 Chronic diastolic (congestive) heart failure: Secondary | ICD-10-CM | POA: Diagnosis not present

## 2019-02-04 DIAGNOSIS — M25552 Pain in left hip: Secondary | ICD-10-CM | POA: Diagnosis not present

## 2019-02-04 DIAGNOSIS — M25562 Pain in left knee: Secondary | ICD-10-CM | POA: Diagnosis not present

## 2019-02-04 DIAGNOSIS — G4700 Insomnia, unspecified: Secondary | ICD-10-CM | POA: Diagnosis not present

## 2019-02-04 DIAGNOSIS — W19XXXS Unspecified fall, sequela: Secondary | ICD-10-CM | POA: Diagnosis not present

## 2019-02-04 DIAGNOSIS — I5033 Acute on chronic diastolic (congestive) heart failure: Secondary | ICD-10-CM | POA: Diagnosis not present

## 2019-02-05 ENCOUNTER — Other Ambulatory Visit: Payer: Self-pay | Admitting: *Deleted

## 2019-02-05 DIAGNOSIS — R601 Generalized edema: Secondary | ICD-10-CM | POA: Diagnosis not present

## 2019-02-05 DIAGNOSIS — M25562 Pain in left knee: Secondary | ICD-10-CM | POA: Diagnosis not present

## 2019-02-05 DIAGNOSIS — M1990 Unspecified osteoarthritis, unspecified site: Secondary | ICD-10-CM | POA: Diagnosis not present

## 2019-02-05 DIAGNOSIS — M25552 Pain in left hip: Secondary | ICD-10-CM | POA: Diagnosis not present

## 2019-02-05 DIAGNOSIS — G8929 Other chronic pain: Secondary | ICD-10-CM | POA: Diagnosis not present

## 2019-02-05 NOTE — Patient Outreach (Signed)
Member discussed in telephonic IDT meeting with facility staff, [facility] UM team. Ms. Wordell remains at [facility] SNF.  Facility discharge planner endorses that member has not been able to speak with Probation officer as of late due to difficulty in understanding her due to most recent bout of CHF requiring medication changes and the like.   Facility reports that member is coming to the realization that returning to her prior living environment is not feasible. Most realistic and safest disposition plan will be for long term care.  Will continue to collaborate with facility staff and [facility] UM team on member.  Will continue to follow.  [name], MSN-Ed, RN,BSN [place] Acute Care Coordinator [phone]

## 2019-02-11 ENCOUNTER — Ambulatory Visit: Payer: Medicare Other | Admitting: Internal Medicine

## 2019-02-12 ENCOUNTER — Other Ambulatory Visit: Payer: Self-pay | Admitting: *Deleted

## 2019-02-12 NOTE — Patient Outreach (Signed)
Member discussed in telephonic IDT meeting with facility staff and [facility] UM Janice Morrow.   Janice Morrow remains at [facility] SNF for rehab therapy.  Rehab director reports member has had some agitation today. Facility staff is uncertain whether member is consistently agreeing to LTC placement. Therapy director is agreeable to coordinating telephone call with Probation officer and member so Probation officer can also encourage long term care placement in a facility. Rehab Director reports [facility] does not currently have a long tem bed available however.   [facility] UM Janice Morrow also suggested palliative services. Rehab Director states he will follow up on request as well.  Writer to outreach to [employer] tomorrow to coordinate telephone call with JaniceGreenman. Writer not meeting member at bedside due to current pandemic restrictions in rehab facilities.    Janice Rolling, MSN-Ed, Janice Morrow,Janice Morrow [place] Acute Care Coordinator [phone]

## 2019-02-13 ENCOUNTER — Other Ambulatory Visit: Payer: Self-pay | Admitting: *Deleted

## 2019-02-13 NOTE — Patient Outreach (Signed)
Telephone call received from [facility] facility staff for writer to speak with Janice Morrow.  Had lengthy conversation with Janice Morrow to encourage her to consider long term care placement. Discussed that due to her increased level of care needs that is not safe for her to return to her prior living condition in a hotel with her brother.   Janice Morrow was initially upset with writer's conversation. However, she came around to understand that the call was simply to help encourage safe discharge planning.   Janice Morrow eventually agreed to strongly considering long term care placement post SNF stay. Confirmed with her that she does have Medicaid which will aide in her long term care placement coverage.  Also discussed that her brother mentioned, per hospital records, that he was unable to care for her any longer. Janice Morrow asked that writer call her brother as well about this.   Writer contacted [name] (brother) at [phone] per [facility] request. Patient identifiers confirmed. [name] indicates he can no longer care for Janice Morrow. He states " I told her that and she knows it. I am on disability myself and I can not help her. I have told her not to worry about me. I am fine."  He endorses that facility has made him aware that member can barely stand and that she has to use the bedpan. [name] states " I just can't handle that." Writer asked [name] to please have this conversation again with his sister. He said he tried to call her but cannot reach her.   [name] states that their sister provided member with a cell phone last weekend and the number is [phone]. Writer attempted to call it on a 3-way with [name]. The call went straight to message saying voicemail not set up. [name] states member doesn't know how to operate the cell phone.   Nonetheless, Probation officer encouraged [name] to call facility to speak with member so she can hear him say that he wants her to be in long term care instead of returning to the hotel  with him.  [name] was very pleasant and honest during the call. Thanked him for taking the time to speak with Probation officer.  Will pass the above information along to facility staff and [facility] UM team.  Will continue to follow and collaborate with facility staff and [name] UM team on member.   [name], MSN-Ed, RN,BSN [place] Acute Care Coordinator [phone] [facility]) [phone]  (Toll free office)

## 2019-02-17 ENCOUNTER — Other Ambulatory Visit: Payer: Self-pay | Admitting: *Deleted

## 2019-02-17 DIAGNOSIS — R262 Difficulty in walking, not elsewhere classified: Secondary | ICD-10-CM | POA: Diagnosis not present

## 2019-02-17 DIAGNOSIS — M25552 Pain in left hip: Secondary | ICD-10-CM | POA: Diagnosis not present

## 2019-02-17 DIAGNOSIS — M9963 Osseous and subluxation stenosis of intervertebral foramina of lumbar region: Secondary | ICD-10-CM | POA: Diagnosis not present

## 2019-02-17 DIAGNOSIS — R601 Generalized edema: Secondary | ICD-10-CM | POA: Diagnosis not present

## 2019-02-17 DIAGNOSIS — M1990 Unspecified osteoarthritis, unspecified site: Secondary | ICD-10-CM | POA: Diagnosis not present

## 2019-02-17 DIAGNOSIS — M79605 Pain in left leg: Secondary | ICD-10-CM | POA: Diagnosis not present

## 2019-02-17 DIAGNOSIS — M545 Low back pain: Secondary | ICD-10-CM | POA: Diagnosis not present

## 2019-02-17 DIAGNOSIS — M25562 Pain in left knee: Secondary | ICD-10-CM | POA: Diagnosis not present

## 2019-02-17 DIAGNOSIS — M6281 Muscle weakness (generalized): Secondary | ICD-10-CM | POA: Diagnosis not present

## 2019-02-17 DIAGNOSIS — G8929 Other chronic pain: Secondary | ICD-10-CM | POA: Diagnosis not present

## 2019-02-17 NOTE — Patient Outreach (Signed)
Received telephone call from [place], discharge planner at [facility]. Marita [place] reports she spoke with member regarding long term care placement in detail. Marita [place] reports member is not agreeable to any associated costs with going to a facility for long term care placement. States member does not want to give up her social security check if that is what she has to do. States Ms. Garofano doesn't think her brother will be able to live without her finances.  Discussed with Marita [place] that both member and brother need to speak with one another. Discussed that meeting/conference call needed with Ms. Mcpeters and her brother so member can hear what the brother has been saying regarding his inability to care for her any longer.   Writer attempted to reach member's brother Mairen Wallenstein without success. HIPPA compliant voicemail message left. Also attempted to reach member on her cell phone. No answer.  Writer will continue to try to reach brother so he can connect with Ms. Flamm as she is now declining long term placement.   [name], MSN-Ed, RN,BSN [place] Acute Care Coordinator [phone] [facility]) [phone]  (Toll free office)

## 2019-02-19 ENCOUNTER — Other Ambulatory Visit: Payer: Self-pay | Admitting: *Deleted

## 2019-02-19 DIAGNOSIS — F419 Anxiety disorder, unspecified: Secondary | ICD-10-CM | POA: Diagnosis not present

## 2019-02-19 DIAGNOSIS — M9963 Osseous and subluxation stenosis of intervertebral foramina of lumbar region: Secondary | ICD-10-CM | POA: Diagnosis not present

## 2019-02-19 DIAGNOSIS — G4700 Insomnia, unspecified: Secondary | ICD-10-CM | POA: Diagnosis not present

## 2019-02-19 DIAGNOSIS — M545 Low back pain: Secondary | ICD-10-CM | POA: Diagnosis not present

## 2019-02-19 DIAGNOSIS — M6281 Muscle weakness (generalized): Secondary | ICD-10-CM | POA: Diagnosis not present

## 2019-02-19 NOTE — Patient Outreach (Signed)
Member assessed for potential [facility] Care Management needs as a benefit of her [place] Medicare.  [facility].  [name] was discussed in weekly telephonic IDT meeting with facility staff, writer, and [facility] UM team.   Per Janice Morrow discharge planner, [name] apparently spoke with her brother. She is now agreeable to stay in a facility for long term. However, member wants to explore having back surgery first. Unclear of details surrounding this.   Writer will continue to follow for disposition plans and collaborate with [facility] UM team and facility staff.  [name], MSN-Ed, RN,BSN [place] Acute Care Coordinator [phone] [facility]) [phone]  (Toll free office)

## 2019-02-20 DIAGNOSIS — M545 Low back pain: Secondary | ICD-10-CM | POA: Diagnosis not present

## 2019-02-20 DIAGNOSIS — M6281 Muscle weakness (generalized): Secondary | ICD-10-CM | POA: Diagnosis not present

## 2019-02-20 DIAGNOSIS — M79605 Pain in left leg: Secondary | ICD-10-CM | POA: Diagnosis not present

## 2019-02-20 DIAGNOSIS — R262 Difficulty in walking, not elsewhere classified: Secondary | ICD-10-CM | POA: Diagnosis not present

## 2019-02-23 ENCOUNTER — Ambulatory Visit: Payer: Medicare Other | Admitting: Physical Medicine & Rehabilitation

## 2019-02-24 DIAGNOSIS — M9963 Osseous and subluxation stenosis of intervertebral foramina of lumbar region: Secondary | ICD-10-CM | POA: Diagnosis not present

## 2019-02-24 DIAGNOSIS — F419 Anxiety disorder, unspecified: Secondary | ICD-10-CM | POA: Diagnosis not present

## 2019-02-24 DIAGNOSIS — M545 Low back pain: Secondary | ICD-10-CM | POA: Diagnosis not present

## 2019-02-24 DIAGNOSIS — M6281 Muscle weakness (generalized): Secondary | ICD-10-CM | POA: Diagnosis not present

## 2019-02-24 DIAGNOSIS — G4700 Insomnia, unspecified: Secondary | ICD-10-CM | POA: Diagnosis not present

## 2019-02-26 ENCOUNTER — Other Ambulatory Visit: Payer: Self-pay | Admitting: *Deleted

## 2019-02-26 NOTE — Patient Outreach (Signed)
Member assessed for potential [facility] Care Management needs as a benefit of [place] Medicare.  Member is currently at [facility] SNF receiving rehab therapy.   Member discussed in weekly telephonic IDT meeting with [facility] facility staff, [facility] UM team, and writer.  Facility reports Ms. Coppin still wants a referral for back surgery. She has been advised that back surgery is not recommended.   Ms. Simien appealed her discharge and won. Facility reports that they have found a long term bed at their sister facility in [place], [facility] when discharging from SNF.  Will continue to follow for disposition plans. Will continue to collaborate with facility and [facility] UM team on member.   [name], MSN-Ed, RN,BSN [place] Acute Care Coordinator [phone] [facility]) [phone]  (Toll free office)

## 2019-03-02 DIAGNOSIS — M9963 Osseous and subluxation stenosis of intervertebral foramina of lumbar region: Secondary | ICD-10-CM | POA: Diagnosis not present

## 2019-03-02 DIAGNOSIS — M545 Low back pain: Secondary | ICD-10-CM | POA: Diagnosis not present

## 2019-03-02 DIAGNOSIS — F419 Anxiety disorder, unspecified: Secondary | ICD-10-CM | POA: Diagnosis not present

## 2019-03-02 DIAGNOSIS — M79605 Pain in left leg: Secondary | ICD-10-CM | POA: Diagnosis not present

## 2019-03-02 DIAGNOSIS — M6281 Muscle weakness (generalized): Secondary | ICD-10-CM | POA: Diagnosis not present

## 2019-03-03 DIAGNOSIS — R05 Cough: Secondary | ICD-10-CM | POA: Diagnosis not present

## 2019-03-03 DIAGNOSIS — M79605 Pain in left leg: Secondary | ICD-10-CM | POA: Diagnosis not present

## 2019-03-03 DIAGNOSIS — R262 Difficulty in walking, not elsewhere classified: Secondary | ICD-10-CM | POA: Diagnosis not present

## 2019-03-03 DIAGNOSIS — J449 Chronic obstructive pulmonary disease, unspecified: Secondary | ICD-10-CM | POA: Diagnosis not present

## 2019-03-03 DIAGNOSIS — M545 Low back pain: Secondary | ICD-10-CM | POA: Diagnosis not present

## 2019-03-03 DIAGNOSIS — M9963 Osseous and subluxation stenosis of intervertebral foramina of lumbar region: Secondary | ICD-10-CM | POA: Diagnosis not present

## 2019-03-03 DIAGNOSIS — R0989 Other specified symptoms and signs involving the circulatory and respiratory systems: Secondary | ICD-10-CM | POA: Diagnosis not present

## 2019-03-03 DIAGNOSIS — M6281 Muscle weakness (generalized): Secondary | ICD-10-CM | POA: Diagnosis not present

## 2019-03-04 DIAGNOSIS — R0989 Other specified symptoms and signs involving the circulatory and respiratory systems: Secondary | ICD-10-CM | POA: Diagnosis not present

## 2019-03-04 DIAGNOSIS — M545 Low back pain: Secondary | ICD-10-CM | POA: Diagnosis not present

## 2019-03-04 DIAGNOSIS — M9963 Osseous and subluxation stenosis of intervertebral foramina of lumbar region: Secondary | ICD-10-CM | POA: Diagnosis not present

## 2019-03-04 DIAGNOSIS — J449 Chronic obstructive pulmonary disease, unspecified: Secondary | ICD-10-CM | POA: Diagnosis not present

## 2019-03-04 DIAGNOSIS — R05 Cough: Secondary | ICD-10-CM | POA: Diagnosis not present

## 2019-03-04 DIAGNOSIS — I5032 Chronic diastolic (congestive) heart failure: Secondary | ICD-10-CM | POA: Diagnosis not present

## 2019-03-04 DIAGNOSIS — M6281 Muscle weakness (generalized): Secondary | ICD-10-CM | POA: Diagnosis not present

## 2019-03-05 DIAGNOSIS — M9963 Osseous and subluxation stenosis of intervertebral foramina of lumbar region: Secondary | ICD-10-CM | POA: Diagnosis not present

## 2019-03-05 DIAGNOSIS — R5381 Other malaise: Secondary | ICD-10-CM | POA: Diagnosis not present

## 2019-03-05 DIAGNOSIS — M6281 Muscle weakness (generalized): Secondary | ICD-10-CM | POA: Diagnosis not present

## 2019-03-05 DIAGNOSIS — R05 Cough: Secondary | ICD-10-CM | POA: Diagnosis not present

## 2019-03-05 DIAGNOSIS — M545 Low back pain: Secondary | ICD-10-CM | POA: Diagnosis not present

## 2019-03-05 DIAGNOSIS — R0989 Other specified symptoms and signs involving the circulatory and respiratory systems: Secondary | ICD-10-CM | POA: Diagnosis not present

## 2019-03-09 DIAGNOSIS — M9963 Osseous and subluxation stenosis of intervertebral foramina of lumbar region: Secondary | ICD-10-CM | POA: Diagnosis not present

## 2019-03-09 DIAGNOSIS — R5381 Other malaise: Secondary | ICD-10-CM | POA: Diagnosis not present

## 2019-03-09 DIAGNOSIS — M6281 Muscle weakness (generalized): Secondary | ICD-10-CM | POA: Diagnosis not present

## 2019-03-09 DIAGNOSIS — M545 Low back pain: Secondary | ICD-10-CM | POA: Diagnosis not present

## 2019-03-12 ENCOUNTER — Other Ambulatory Visit: Payer: Self-pay | Admitting: *Deleted

## 2019-03-12 NOTE — Patient Outreach (Signed)
Member assessed for potential [facility] Care Management needs as a benefit of [place] [employer].  Confirmed with [facility] UM RN that Ms. Sanderlin transitioned to long term care at [facility].  Writer to sign off. No further identifiable [facility] Care Management need at this time.   [name], MSN-Ed, RN,BSN [place] Acute Care Coordinator [phone] [facility]) [phone]  (Toll free office)

## 2019-03-18 ENCOUNTER — Telehealth: Payer: Self-pay | Admitting: Internal Medicine

## 2019-03-18 NOTE — Telephone Encounter (Signed)
Tried contacting pt, unable to LVM 

## 2019-03-18 NOTE — Telephone Encounter (Signed)
Pt calling from [facility] and asking for Dr Ronnald Ramp to refer her to a back surgeon.  Pt states dr knows she has a bad disk in her back and cannot walk.  Pt states she wants to take care of this so she can walk again.  Pt states she cannot move her left leg at all.  Pt states she cannot get out of bed without a lift, and sometimes tries to get into a wheelchair.  Pt states Ms Martinique at [facility] can be notified at front desk with any questions /concerns.  Pt not seen Dr Ronnald Ramp since 08/12/18.  Advised pt dr Lu Duffel need to see her for referral since it has been over 6 months.  Pt states that will be fine, please call to make appt on her cell number.

## 2019-03-18 NOTE — Telephone Encounter (Signed)
yes

## 2019-03-18 NOTE — Telephone Encounter (Signed)
Are you okay with virtual?

## 2019-03-20 DIAGNOSIS — Z6841 Body Mass Index (BMI) 40.0 and over, adult: Secondary | ICD-10-CM | POA: Diagnosis not present

## 2019-03-20 DIAGNOSIS — M6281 Muscle weakness (generalized): Secondary | ICD-10-CM | POA: Diagnosis not present

## 2019-03-20 DIAGNOSIS — J449 Chronic obstructive pulmonary disease, unspecified: Secondary | ICD-10-CM | POA: Diagnosis not present

## 2019-03-20 DIAGNOSIS — J9611 Chronic respiratory failure with hypoxia: Secondary | ICD-10-CM | POA: Diagnosis not present

## 2019-03-20 DIAGNOSIS — K219 Gastro-esophageal reflux disease without esophagitis: Secondary | ICD-10-CM | POA: Diagnosis not present

## 2019-03-20 DIAGNOSIS — G894 Chronic pain syndrome: Secondary | ICD-10-CM | POA: Diagnosis not present

## 2019-03-20 DIAGNOSIS — I5032 Chronic diastolic (congestive) heart failure: Secondary | ICD-10-CM | POA: Diagnosis not present

## 2019-03-20 DIAGNOSIS — M170 Bilateral primary osteoarthritis of knee: Secondary | ICD-10-CM | POA: Diagnosis not present

## 2019-03-20 DIAGNOSIS — I10 Essential (primary) hypertension: Secondary | ICD-10-CM | POA: Diagnosis not present

## 2019-03-23 DIAGNOSIS — D649 Anemia, unspecified: Secondary | ICD-10-CM | POA: Diagnosis not present

## 2019-03-23 DIAGNOSIS — E559 Vitamin D deficiency, unspecified: Secondary | ICD-10-CM | POA: Diagnosis not present

## 2019-03-23 DIAGNOSIS — Z139 Encounter for screening, unspecified: Secondary | ICD-10-CM | POA: Diagnosis not present

## 2019-03-23 DIAGNOSIS — E119 Type 2 diabetes mellitus without complications: Secondary | ICD-10-CM | POA: Diagnosis not present

## 2019-03-23 DIAGNOSIS — E785 Hyperlipidemia, unspecified: Secondary | ICD-10-CM | POA: Diagnosis not present

## 2019-03-23 DIAGNOSIS — E039 Hypothyroidism, unspecified: Secondary | ICD-10-CM | POA: Diagnosis not present

## 2019-03-23 DIAGNOSIS — R799 Abnormal finding of blood chemistry, unspecified: Secondary | ICD-10-CM | POA: Diagnosis not present

## 2019-03-24 ENCOUNTER — Ambulatory Visit: Payer: Medicare Other | Admitting: Internal Medicine

## 2019-03-25 NOTE — Telephone Encounter (Signed)
Pt has appt scheduled for 7/30

## 2019-03-26 ENCOUNTER — Other Ambulatory Visit: Payer: Self-pay

## 2019-03-26 ENCOUNTER — Ambulatory Visit: Payer: Medicare Other | Admitting: Internal Medicine

## 2019-03-30 ENCOUNTER — Ambulatory Visit: Payer: Medicare Other | Admitting: Internal Medicine

## 2019-03-31 DIAGNOSIS — M25561 Pain in right knee: Secondary | ICD-10-CM | POA: Diagnosis not present

## 2019-03-31 DIAGNOSIS — M170 Bilateral primary osteoarthritis of knee: Secondary | ICD-10-CM | POA: Diagnosis not present

## 2019-03-31 DIAGNOSIS — M25562 Pain in left knee: Secondary | ICD-10-CM | POA: Diagnosis not present

## 2019-03-31 DIAGNOSIS — M9963 Osseous and subluxation stenosis of intervertebral foramina of lumbar region: Secondary | ICD-10-CM | POA: Diagnosis not present

## 2019-03-31 DIAGNOSIS — M545 Low back pain: Secondary | ICD-10-CM | POA: Diagnosis not present

## 2019-04-01 DIAGNOSIS — Z20828 Contact with and (suspected) exposure to other viral communicable diseases: Secondary | ICD-10-CM | POA: Diagnosis not present

## 2019-04-03 DIAGNOSIS — M2550 Pain in unspecified joint: Secondary | ICD-10-CM | POA: Diagnosis not present

## 2019-04-03 DIAGNOSIS — J449 Chronic obstructive pulmonary disease, unspecified: Secondary | ICD-10-CM | POA: Diagnosis not present

## 2019-04-03 DIAGNOSIS — R531 Weakness: Secondary | ICD-10-CM | POA: Diagnosis not present

## 2019-04-03 DIAGNOSIS — G894 Chronic pain syndrome: Secondary | ICD-10-CM | POA: Diagnosis not present

## 2019-04-03 DIAGNOSIS — E118 Type 2 diabetes mellitus with unspecified complications: Secondary | ICD-10-CM | POA: Diagnosis not present

## 2019-04-03 DIAGNOSIS — R52 Pain, unspecified: Secondary | ICD-10-CM | POA: Diagnosis not present

## 2019-04-03 DIAGNOSIS — I2782 Chronic pulmonary embolism: Secondary | ICD-10-CM | POA: Diagnosis not present

## 2019-04-03 DIAGNOSIS — I959 Hypotension, unspecified: Secondary | ICD-10-CM | POA: Diagnosis not present

## 2019-04-03 DIAGNOSIS — J9610 Chronic respiratory failure, unspecified whether with hypoxia or hypercapnia: Secondary | ICD-10-CM | POA: Diagnosis not present

## 2019-04-03 DIAGNOSIS — M6281 Muscle weakness (generalized): Secondary | ICD-10-CM | POA: Diagnosis not present

## 2019-04-03 DIAGNOSIS — I5032 Chronic diastolic (congestive) heart failure: Secondary | ICD-10-CM | POA: Diagnosis not present

## 2019-04-03 DIAGNOSIS — M170 Bilateral primary osteoarthritis of knee: Secondary | ICD-10-CM | POA: Diagnosis not present

## 2019-04-03 DIAGNOSIS — J9611 Chronic respiratory failure with hypoxia: Secondary | ICD-10-CM | POA: Diagnosis not present

## 2019-04-03 DIAGNOSIS — Z7401 Bed confinement status: Secondary | ICD-10-CM | POA: Diagnosis not present

## 2019-04-07 DIAGNOSIS — I5032 Chronic diastolic (congestive) heart failure: Secondary | ICD-10-CM | POA: Diagnosis not present

## 2019-04-07 DIAGNOSIS — G894 Chronic pain syndrome: Secondary | ICD-10-CM | POA: Diagnosis not present

## 2019-04-08 DIAGNOSIS — I10 Essential (primary) hypertension: Secondary | ICD-10-CM | POA: Diagnosis not present

## 2019-04-08 DIAGNOSIS — D649 Anemia, unspecified: Secondary | ICD-10-CM | POA: Diagnosis not present

## 2019-04-10 DIAGNOSIS — E119 Type 2 diabetes mellitus without complications: Secondary | ICD-10-CM | POA: Diagnosis not present

## 2019-04-10 DIAGNOSIS — D649 Anemia, unspecified: Secondary | ICD-10-CM | POA: Diagnosis not present

## 2019-04-10 DIAGNOSIS — G894 Chronic pain syndrome: Secondary | ICD-10-CM | POA: Diagnosis not present

## 2019-04-10 DIAGNOSIS — M170 Bilateral primary osteoarthritis of knee: Secondary | ICD-10-CM | POA: Diagnosis not present

## 2019-04-10 DIAGNOSIS — I5032 Chronic diastolic (congestive) heart failure: Secondary | ICD-10-CM | POA: Diagnosis not present

## 2019-04-16 DIAGNOSIS — I5032 Chronic diastolic (congestive) heart failure: Secondary | ICD-10-CM | POA: Diagnosis not present

## 2019-04-16 DIAGNOSIS — Z9981 Dependence on supplemental oxygen: Secondary | ICD-10-CM | POA: Diagnosis not present

## 2019-04-16 DIAGNOSIS — G894 Chronic pain syndrome: Secondary | ICD-10-CM | POA: Diagnosis not present

## 2019-04-16 DIAGNOSIS — R531 Weakness: Secondary | ICD-10-CM | POA: Diagnosis not present

## 2019-04-18 DIAGNOSIS — R319 Hematuria, unspecified: Secondary | ICD-10-CM | POA: Diagnosis not present

## 2019-04-18 DIAGNOSIS — Z79899 Other long term (current) drug therapy: Secondary | ICD-10-CM | POA: Diagnosis not present

## 2019-04-18 DIAGNOSIS — N390 Urinary tract infection, site not specified: Secondary | ICD-10-CM | POA: Diagnosis not present

## 2019-04-18 DIAGNOSIS — I10 Essential (primary) hypertension: Secondary | ICD-10-CM | POA: Diagnosis not present

## 2019-05-07 DIAGNOSIS — J449 Chronic obstructive pulmonary disease, unspecified: Secondary | ICD-10-CM | POA: Diagnosis not present

## 2019-05-15 DIAGNOSIS — L89320 Pressure ulcer of left buttock, unstageable: Secondary | ICD-10-CM | POA: Diagnosis not present

## 2019-05-15 DIAGNOSIS — L89310 Pressure ulcer of right buttock, unstageable: Secondary | ICD-10-CM | POA: Diagnosis not present

## 2019-05-25 ENCOUNTER — Emergency Department: Payer: Medicare Other

## 2019-05-25 ENCOUNTER — Encounter: Payer: Self-pay | Admitting: Emergency Medicine

## 2019-05-25 ENCOUNTER — Emergency Department
Admission: EM | Admit: 2019-05-25 | Discharge: 2019-05-26 | Disposition: A | Discharge status: Other Acute Inpatient Hospital | Payer: Medicare Other | Attending: Student | Admitting: Student

## 2019-05-25 ENCOUNTER — Other Ambulatory Visit: Payer: Self-pay

## 2019-05-25 DIAGNOSIS — Z8249 Family history of ischemic heart disease and other diseases of the circulatory system: Secondary | ICD-10-CM | POA: Diagnosis not present

## 2019-05-25 DIAGNOSIS — A419 Sepsis, unspecified organism: Secondary | ICD-10-CM | POA: Diagnosis not present

## 2019-05-25 DIAGNOSIS — Z794 Long term (current) use of insulin: Secondary | ICD-10-CM | POA: Diagnosis not present

## 2019-05-25 DIAGNOSIS — E119 Type 2 diabetes mellitus without complications: Secondary | ICD-10-CM | POA: Diagnosis not present

## 2019-05-25 DIAGNOSIS — E1151 Type 2 diabetes mellitus with diabetic peripheral angiopathy without gangrene: Secondary | ICD-10-CM | POA: Diagnosis not present

## 2019-05-25 DIAGNOSIS — N390 Urinary tract infection, site not specified: Secondary | ICD-10-CM | POA: Diagnosis not present

## 2019-05-25 DIAGNOSIS — K8020 Calculus of gallbladder without cholecystitis without obstruction: Secondary | ICD-10-CM | POA: Insufficient documentation

## 2019-05-25 DIAGNOSIS — E785 Hyperlipidemia, unspecified: Secondary | ICD-10-CM | POA: Diagnosis not present

## 2019-05-25 DIAGNOSIS — J449 Chronic obstructive pulmonary disease, unspecified: Secondary | ICD-10-CM | POA: Diagnosis not present

## 2019-05-25 DIAGNOSIS — R4182 Altered mental status, unspecified: Secondary | ICD-10-CM

## 2019-05-25 DIAGNOSIS — Z886 Allergy status to analgesic agent status: Secondary | ICD-10-CM | POA: Diagnosis not present

## 2019-05-25 DIAGNOSIS — Z79899 Other long term (current) drug therapy: Secondary | ICD-10-CM | POA: Insufficient documentation

## 2019-05-25 DIAGNOSIS — R0902 Hypoxemia: Secondary | ICD-10-CM | POA: Diagnosis not present

## 2019-05-25 DIAGNOSIS — N189 Chronic kidney disease, unspecified: Secondary | ICD-10-CM | POA: Insufficient documentation

## 2019-05-25 DIAGNOSIS — I5032 Chronic diastolic (congestive) heart failure: Secondary | ICD-10-CM | POA: Diagnosis not present

## 2019-05-25 DIAGNOSIS — R0689 Other abnormalities of breathing: Secondary | ICD-10-CM | POA: Diagnosis not present

## 2019-05-25 DIAGNOSIS — E1122 Type 2 diabetes mellitus with diabetic chronic kidney disease: Secondary | ICD-10-CM | POA: Insufficient documentation

## 2019-05-25 DIAGNOSIS — Z86711 Personal history of pulmonary embolism: Secondary | ICD-10-CM | POA: Diagnosis not present

## 2019-05-25 DIAGNOSIS — L89159 Pressure ulcer of sacral region, unspecified stage: Secondary | ICD-10-CM | POA: Diagnosis not present

## 2019-05-25 DIAGNOSIS — Z87891 Personal history of nicotine dependence: Secondary | ICD-10-CM | POA: Insufficient documentation

## 2019-05-25 DIAGNOSIS — L89150 Pressure ulcer of sacral region, unstageable: Secondary | ICD-10-CM | POA: Insufficient documentation

## 2019-05-25 DIAGNOSIS — N200 Calculus of kidney: Secondary | ICD-10-CM | POA: Diagnosis not present

## 2019-05-25 DIAGNOSIS — R4020 Unspecified coma: Secondary | ICD-10-CM | POA: Diagnosis not present

## 2019-05-25 DIAGNOSIS — R404 Transient alteration of awareness: Secondary | ICD-10-CM | POA: Diagnosis not present

## 2019-05-25 DIAGNOSIS — Z7901 Long term (current) use of anticoagulants: Secondary | ICD-10-CM | POA: Diagnosis not present

## 2019-05-25 DIAGNOSIS — L89310 Pressure ulcer of right buttock, unstageable: Secondary | ICD-10-CM | POA: Diagnosis not present

## 2019-05-25 DIAGNOSIS — I130 Hypertensive heart and chronic kidney disease with heart failure and stage 1 through stage 4 chronic kidney disease, or unspecified chronic kidney disease: Secondary | ICD-10-CM | POA: Diagnosis not present

## 2019-05-25 DIAGNOSIS — U071 COVID-19: Secondary | ICD-10-CM | POA: Insufficient documentation

## 2019-05-25 DIAGNOSIS — R6520 Severe sepsis without septic shock: Secondary | ICD-10-CM | POA: Diagnosis not present

## 2019-05-25 DIAGNOSIS — N179 Acute kidney failure, unspecified: Secondary | ICD-10-CM | POA: Insufficient documentation

## 2019-05-25 DIAGNOSIS — L89320 Pressure ulcer of left buttock, unstageable: Secondary | ICD-10-CM | POA: Diagnosis not present

## 2019-05-25 LAB — COMPREHENSIVE METABOLIC PANEL
ALT: 12 U/L (ref 0–44)
AST: 19 U/L (ref 15–41)
Albumin: 2.5 g/dL — ABNORMAL LOW (ref 3.5–5.0)
Alkaline Phosphatase: 83 U/L (ref 38–126)
Anion gap: 16 — ABNORMAL HIGH (ref 5–15)
BUN: 169 mg/dL — ABNORMAL HIGH (ref 8–23)
CO2: 26 mmol/L (ref 22–32)
Calcium: 9.6 mg/dL (ref 8.9–10.3)
Chloride: 94 mmol/L — ABNORMAL LOW (ref 98–111)
Creatinine, Ser: 3.69 mg/dL — ABNORMAL HIGH (ref 0.44–1.00)
GFR calc Af Amer: 14 mL/min — ABNORMAL LOW (ref >60)
GFR calc non Af Amer: 12 mL/min — ABNORMAL LOW (ref >60)
Glucose, Bld: 133 mg/dL — ABNORMAL HIGH (ref 70–99)
Potassium: 4.9 mmol/L (ref 3.5–5.1)
Sodium: 136 mmol/L (ref 135–145)
Total Bilirubin: 1.5 mg/dL — ABNORMAL HIGH (ref 0.3–1.2)
Total Protein: 6.8 g/dL (ref 6.5–8.1)

## 2019-05-25 LAB — CBC WITH DIFFERENTIAL/PLATELET
Abs Immature Granulocytes: 0.56 10*3/uL — ABNORMAL HIGH (ref 0.00–0.07)
Basophils Absolute: 0.1 10*3/uL (ref 0.0–0.1)
Basophils Relative: 1 %
Eosinophils Absolute: 0 10*3/uL (ref 0.0–0.5)
Eosinophils Relative: 0 %
HCT: 38.9 % (ref 36.0–46.0)
Hemoglobin: 12 g/dL (ref 12.0–15.0)
Immature Granulocytes: 5 %
Lymphocytes Relative: 10 %
Lymphs Abs: 1.2 10*3/uL (ref 0.7–4.0)
MCH: 27.3 pg (ref 26.0–34.0)
MCHC: 30.8 g/dL (ref 30.0–36.0)
MCV: 88.6 fL (ref 80.0–100.0)
Monocytes Absolute: 0.2 10*3/uL (ref 0.1–1.0)
Monocytes Relative: 2 %
Neutro Abs: 10 10*3/uL — ABNORMAL HIGH (ref 1.7–7.7)
Neutrophils Relative %: 82 %
Platelets: 86 10*3/uL — ABNORMAL LOW (ref 150–400)
RBC: 4.39 MIL/uL (ref 3.87–5.11)
RDW: 16.5 % — ABNORMAL HIGH (ref 11.5–15.5)
WBC: 12 10*3/uL — ABNORMAL HIGH (ref 4.0–10.5)
nRBC: 0.5 % — ABNORMAL HIGH (ref 0.0–0.2)

## 2019-05-25 LAB — URINE DRUG SCREEN, QUALITATIVE (ARMC ONLY)
Amphetamines, Ur Screen: NOT DETECTED
Barbiturates, Ur Screen: NOT DETECTED
Benzodiazepine, Ur Scrn: NOT DETECTED
Cannabinoid 50 Ng, Ur ~~LOC~~: NOT DETECTED
Cocaine Metabolite,Ur ~~LOC~~: NOT DETECTED
MDMA (Ecstasy)Ur Screen: NOT DETECTED
Methadone Scn, Ur: NOT DETECTED
Opiate, Ur Screen: NOT DETECTED
Phencyclidine (PCP) Ur S: NOT DETECTED
Tricyclic, Ur Screen: NOT DETECTED

## 2019-05-25 LAB — BLOOD GAS, ARTERIAL
Acid-base deficit: 1.5 mmol/L (ref 0.0–2.0)
Bicarbonate: 24.8 mmol/L (ref 20.0–28.0)
FIO2: 0.28
O2 Saturation: 96.5 %
Patient temperature: 35.6
pCO2 arterial: 44 mmHg (ref 32.0–48.0)
pH, Arterial: 7.35 (ref 7.350–7.450)
pO2, Arterial: 84 mmHg (ref 83.0–108.0)

## 2019-05-25 LAB — URINALYSIS, COMPLETE (UACMP) WITH MICROSCOPIC
Bilirubin Urine: NEGATIVE
Glucose, UA: NEGATIVE mg/dL
Ketones, ur: 5 mg/dL — AB
Nitrite: NEGATIVE
Protein, ur: >=300 mg/dL — AB
RBC / HPF: >50 RBC/hpf — ABNORMAL HIGH (ref 0–5)
Specific Gravity, Urine: 1.024 (ref 1.005–1.030)
Squamous Epithelial / HPF: NONE SEEN (ref 0–5)
WBC, UA: >50 WBC/hpf — ABNORMAL HIGH (ref 0–5)
pH: 7 (ref 5.0–8.0)

## 2019-05-25 LAB — ETHANOL: Alcohol, Ethyl (B): <10 mg/dL (ref <10)

## 2019-05-25 LAB — SARS CORONAVIRUS 2 BY RT PCR (HOSPITAL ORDER, PERFORMED IN ~~LOC~~ HOSPITAL LAB): SARS Coronavirus 2: POSITIVE — AB

## 2019-05-25 LAB — PROTIME-INR
INR: 1.5 — ABNORMAL HIGH (ref 0.8–1.2)
Prothrombin Time: 18.1 seconds — ABNORMAL HIGH (ref 11.4–15.2)

## 2019-05-25 LAB — LACTIC ACID, PLASMA: Lactic Acid, Venous: 2.1 mmol/L (ref 0.5–1.9)

## 2019-05-25 LAB — ACETAMINOPHEN LEVEL: Acetaminophen (Tylenol), Serum: <10 ug/mL — ABNORMAL LOW (ref 10–30)

## 2019-05-25 LAB — APTT: aPTT: 27 seconds (ref 24–36)

## 2019-05-25 LAB — BRAIN NATRIURETIC PEPTIDE: B Natriuretic Peptide: 58 pg/mL (ref 0.0–100.0)

## 2019-05-25 LAB — SALICYLATE LEVEL: Salicylate Lvl: <7 mg/dL (ref 2.8–30.0)

## 2019-05-25 MED ORDER — VASOPRESSIN 20 UNIT/ML IV SOLN
0.0300 [IU]/min | INTRAVENOUS | Status: DC
  Administered 2019-05-25: 22:00:00 0.03 [IU]/min via INTRAVENOUS
  Filled 2019-05-25: qty 2

## 2019-05-25 MED ORDER — VANCOMYCIN HCL IN DEXTROSE 1-5 GM/200ML-% IV SOLN
1000.0000 mg | Freq: Once | INTRAVENOUS | Status: AC
  Administered 2019-05-25: 1000 mg via INTRAVENOUS
  Filled 2019-05-25: qty 200

## 2019-05-25 MED ORDER — SODIUM CHLORIDE 0.9 % IV BOLUS
1000.0000 mL | Freq: Once | INTRAVENOUS | Status: AC
  Administered 2019-05-25: 1000 mL via INTRAVENOUS

## 2019-05-25 MED ORDER — LORAZEPAM 2 MG/ML IJ SOLN
1.0000 mg | Freq: Once | INTRAMUSCULAR | Status: AC
  Administered 2019-05-25: 20:00:00 1 mg via INTRAVENOUS

## 2019-05-25 MED ORDER — SODIUM CHLORIDE 0.9 % IV SOLN
2.0000 g | Freq: Once | INTRAVENOUS | Status: AC
  Administered 2019-05-25: 2 g via INTRAVENOUS
  Filled 2019-05-25: qty 2

## 2019-05-25 MED ORDER — LORAZEPAM 2 MG/ML IJ SOLN
INTRAMUSCULAR | Status: AC
  Administered 2019-05-25: 1 mg via INTRAVENOUS
  Filled 2019-05-25: qty 1

## 2019-05-25 MED ORDER — SODIUM CHLORIDE 0.9 % IV BOLUS
1000.0000 mL | Freq: Once | INTRAVENOUS | Status: AC
  Administered 2019-05-25: 21:00:00 1000 mL via INTRAVENOUS

## 2019-05-25 MED ORDER — NOREPINEPHRINE 4 MG/250ML-% IV SOLN
0.0000 ug/min | INTRAVENOUS | Status: DC
  Administered 2019-05-25: 2 ug/min via INTRAVENOUS
  Filled 2019-05-25 (×2): qty 250

## 2019-05-25 MED ORDER — SODIUM CHLORIDE 0.9 % IV BOLUS
1000.0000 mL | Freq: Once | INTRAVENOUS | Status: AC
  Administered 2019-05-26: 1000 mL via INTRAVENOUS

## 2019-05-25 MED ORDER — METRONIDAZOLE IN NACL 5-0.79 MG/ML-% IV SOLN
500.0000 mg | Freq: Once | INTRAVENOUS | Status: AC
  Administered 2019-05-25: 500 mg via INTRAVENOUS
  Filled 2019-05-25: qty 100

## 2019-05-25 NOTE — ED Notes (Signed)
MD at bedside to place central line.

## 2019-05-25 NOTE — ED Notes (Signed)
This RN has called lab 3 times for blood culture draws

## 2019-05-25 NOTE — ED Notes (Signed)
2005 MD notified of seizure like activity affecting L arm and eyebrows w rhythmic movement  2006 1mg  ativan given  2008-2012 IV team at bedside unable to start peripheral IV, MD to place central line

## 2019-05-25 NOTE — ED Notes (Signed)
Switched to warm saline to help bring up temp

## 2019-05-25 NOTE — ED Notes (Signed)
Triple lumen central line placed by Joan Mayans, MD. 1 set cultures obtained from CVL, MD ok with starting abx. Warm blankets applied to raise temp. Will do bair hugger if no response.

## 2019-05-25 NOTE — ED Notes (Signed)
RN and MD have tried to contact patient's family without response

## 2019-05-25 NOTE — ED Notes (Signed)
Patient placed on bair hugger at 2114

## 2019-05-25 NOTE — ED Notes (Signed)
IV team at bedside to place possible midline and draw cultures

## 2019-05-25 NOTE — ED Notes (Signed)
Attempted to reposition patient; patient moaning when touched. Propped arms on pillow

## 2019-05-25 NOTE — ED Provider Notes (Addendum)
[facility] Emergency Department Provider Note  ____________________________________________   First MD Initiated Contact with Patient 05/25/19 1727     (approximate)  I have reviewed the triage vital signs and the nursing notes.  History  Chief Complaint No chief complaint on file.    HPI Janice Morrow is a 73 y.o. female with a history of hypertension, hyperlipidemia, COPD on nasal cannula, PE, diastolic heart failure, CKD, morbid obesity who presents from her living facility [facility]) for reported altered mental status and decline.  Per EMS, her living facility reported that she was altered above baseline, but provided no other history.  Was apparently hypotensive at her living facility, but normotensive with EMS.  On the patient's arrival, she moans out when being moved, but otherwise does not provide any other history.  History obtained from chart review and EMS, as patient is altered.   Past Medical Hx Past Medical History:  Diagnosis Date  . Acute diastolic CHF (congestive heart failure) ([place]) 08/24/2015  . Cervicalgia   . Chronic back pain   . COPD (chronic obstructive pulmonary disease) (HCC)    on 4L home O2  . Essential hypertension 08/24/2015  . GERD (gastroesophageal reflux disease)   . Headache(784.0)   . Heart murmur   . History of kidney stones   . Hyperlipidemia   . Migraine without aura, with intractable migraine, so stated, without mention of status migrainosus   . OSA on CPAP    she no longer wears CPAP  . Osteoarthritis   . Pulmonary embolism ([place])   . PVD (peripheral vascular disease) ([place])   . Seizures ([place]) 07/2014, 10/2014   due to baclofen  . Shortness of breath dyspnea   . Trigeminal neuralgia     Problem List Patient Active Problem List   Diagnosis Date Noted  . Pressure injury of skin 01/14/2019  . Acute pyelonephritis 01/10/2019  . Sepsis ([place]) 01/10/2019  . Chronic diastolic CHF  (congestive heart failure) ([place]) 01/10/2019  . Abdominal pain 01/10/2019  . PE (pulmonary thromboembolism) ([place]) 01/10/2019  . Mild renal insufficiency 07/28/2018  . Chronic respiratory failure with hypoxia ([place])   . Insomnia secondary to chronic pain 02/03/2018  . Cervical disc disease with myelopathy 01/28/2018  . Primary osteoarthritis of both knees 10/14/2017  . Type 2 diabetes mellitus with complication, without long-term current use of insulin ([place]) 05/20/2017  . Kidney stone on left side   . COPD (chronic obstructive pulmonary disease) ([place]) 08/24/2015  . Essential hypertension 08/24/2015  . Seizures ([place]) 08/24/2015  . Obesity hypoventilation syndrome ([place]) 08/24/2015  . Migraine without aura and with status migrainosus, not intractable 07/12/2015  . OSA on CPAP 07/12/2015  . Left ventricular diastolic dysfunction, NYHA class 1 04/11/2015  . Visit for screening mammogram 03/08/2015  . Trigeminal neuralgia of right side of face 11/23/2014  . Chronic back pain 11/26/2013  . Hyperlipidemia with target LDL less than 130 09/01/2010  . Severe obesity (BMI >= 40) ([place]) 09/01/2010  . GERD 09/01/2010    Past Surgical Hx Past Surgical History:  Procedure Laterality Date  . LEFT HEART CATH AND CORONARY ANGIOGRAPHY N/A 10/01/2017   Procedure: LEFT HEART CATH AND CORONARY ANGIOGRAPHY;  Surgeon: Leonie Man, MD;  Location: [facility];  Service: Cardiovascular;  Laterality: N/A;  . MULTIPLE TOOTH EXTRACTIONS     "they took out part of my teeth; I took out the rest when they got loose; was suppose to get dentures;  never did"  . NO PAST SURGERIES    . URETEROSCOPY WITH HOLMIUM LASER LITHOTRIPSY Left 07/09/2017   Procedure: URETEROSCOPY WITH HOLMIUM LASER LITHOTRIPSY/ STENT;  Surgeon: Festus Aloe, MD;  Location: [facility];  Service: Urology;  Laterality: Left;  . URETEROSCOPY WITH HOLMIUM LASER LITHOTRIPSY Left 07/23/2017   Procedure: URETEROSCOPY WITH HOLMIUM LASER  LITHOTRIPSY /STENT;  Surgeon: Festus Aloe, MD;  Location: [facility];  Service: Urology;  Laterality: Left;  NEEDS 60 MINUTES FOR PROCEDURE    Medications Prior to Admission medications   Medication Sig Start Date End Date Taking? Authorizing Provider  carvedilol (COREG) 6.25 MG tablet Take 1 tablet (6.25 mg total) by mouth 2 (two) times daily with a meal. 01/15/19   Samtani, Jai-Gurmukh, MD  ELIQUIS 5 MG TABS tablet TAKE 1 TABLET BY MOUTH TWICE A DAY Patient taking differently: Take 5 mg by mouth 2 (two) times daily.  04/28/18   Janith Lima, MD  furosemide (LASIX) 40 MG tablet TAKE 1 TABLET (40 MG TOTAL) BY MOUTH DAILY. Patient not taking: Reported on 01/10/2019 04/20/18   Janith Lima, MD  gabapentin (NEURONTIN) 400 MG capsule Take 1 capsule (400 mg total) by mouth 3 (three) times daily. 01/15/19   Nita Sells, MD  ipratropium-albuterol (DUONEB) 0.5-2.5 (3) MG/3ML SOLN Take 3 mLs by nebulization 3 (three) times daily. Patient not taking: Reported on 01/10/2019 04/14/18   Janith Lima, MD  OXYGEN Inhale 4 L into the lungs continuous.     [provider]  umeclidinium-vilanterol (ANORO ELLIPTA) 62.5-25 MCG/INH AEPB Inhale 1 puff into the lungs daily. Patient not taking: Reported on 01/10/2019 08/12/18   Janith Lima, MD    Allergies Baclofen, Imdur [isosorbide dinitrate], and Naproxen  Family Hx Family History  Problem Relation Age of Onset  . Heart attack Father   . Diabetes Mother   . Dementia Mother   . Diabetes Brother   . Heart disease Other   . Diabetes Other     Social Hx Social History   Tobacco Use  . Smoking status: Former Smoker    Packs/day: 0.30    Years: 30.00    Pack years: 9.00    Types: Cigarettes    Quit date: 08/22/2010    Years since quitting: 8.7  . Smokeless tobacco: Never Used  Substance Use Topics  . Alcohol use: No    Alcohol/week: 0.0 standard drinks  . Drug use: No     Review of Systems Unable to obtain due to  patient's altered mental status.   Physical Exam ED Triage Vitals  Enc Vitals Group     BP 05/25/19 1833 (!) 132/96     Pulse Rate 05/25/19 1904 78     Resp 05/25/19 1833 (!) 31     Temp 05/25/19 1904 (!) 97.2 F (36.2 C)     Temp Source 05/25/19 1928 Rectal     SpO2 05/25/19 1904 94 %     Weight 05/25/19 1931 (!) 349 lb 10.4 oz (158.6 kg)     Height 05/25/19 1931 5\' 3"  (1.6 m)     Head Circumference --      Peak Flow --      Pain Score --      Pain Loc --      Pain Edu? --      Excl. in [place]? --      Constitutional: Altered, moans with positional changes and IV, but does not provide history. Obese.  Head: Normocephalic. Atraumatic. Eyes: Conjunctivae  clear. Sclera anicteric. Nose: No congestion. No rhinorrhea. Mouth/Throat: Mucous membranes are dry. Appears to have dried blood on lips. Neck: No stridor.   Cardiovascular: Normal rate, regular rhythm. Extremities well perfused. Respiratory: Normal respiratory effort.  Lungs CTAB. Gastrointestinal: Soft. Non-tender. Non-distended. Questionable vaginal bleeding.  Musculoskeletal: No lower extremity edema. No deformities. Neurologic:  Normal speech and language. No gross focal neurologic deficits are appreciated.  Skin: Large, deep, unstageable sacral decubitus ulcer with purulent packing in place, markedly malodorous, with concern for bony exposure.  Psychiatric: Unable to obtain due to AMS.  EKG  Personally reviewed.   Rate: 79 Rhythm: sinus Axis: LAD Intervals: WNL Significant baseline artifact No clear STEMI    Radiology  CT head: IMPRESSION:  1. No acute intracranial abnormality. Negative for age non contrast  CT appearance of the brain.  2. Mild sphenoid sinus inflammation is new since 2019.   CT A/P: IMPRESSION: 1. Large sacral decubitus wound/ulcer, extends down to the bony surface of the sacrum. When compared to the most recent prior CT, coccygeal bones are no longer visualized, presumably due to  osteomyelitis. No gross focal fluid collection to suggest abscess 2. Gallstone 3. Perinephric edema and scarring/atrophy upper pole left kidney as before. Multiple stones and calcifications in the upper pole of the left kidney  CXR: IMPRESSION:  No new focal pulmonary opacity. Prominent bilateral interstitium  could represent trace edema or bronchitis in the appropriate  clinical setting.    Procedures  Procedure(s) performed (including critical care):  .Critical Care Performed by: Lilia Pro., MD Authorized by: Lilia Pro., MD   Critical care provider statement:    Critical care time (minutes):  75   Critical care was necessary to treat or prevent imminent or life-threatening deterioration of the following conditions:  Sepsis   Critical care was time spent personally by me on the following activities:  Discussions with consultants, evaluation of patient's response to treatment, examination of patient, ordering and performing treatments and interventions, ordering and review of laboratory studies, ordering and review of radiographic studies, pulse oximetry, re-evaluation of patient's condition, obtaining history from patient or surrogate and review of old charts .Central Line  Date/Time: 05/25/2019 9:08 PM Performed by: Lilia Pro., MD Authorized by: Lilia Pro., MD   Consent:    Consent obtained:  Emergent situation Pre-procedure details:    Hand hygiene: Hand hygiene performed prior to insertion     Sterile barrier technique: All elements of maximal sterile technique followed     Skin preparation:  2% chlorhexidine   Skin preparation agent: Skin preparation agent completely dried prior to procedure   Anesthesia (see MAR for exact dosages):    Anesthesia method:  Local infiltration   Local anesthetic:  Lidocaine 1% w/o epi Procedure details:    Location:  L femoral   Patient position:  Flat   Procedural supplies:  Triple lumen   Catheter size:  7 Fr    Landmarks identified: yes     Number of attempts:  1   Successful placement: yes   Post-procedure details:    Post-procedure:  Dressing applied and line sutured   Assessment:  Blood return through all ports   Patient tolerance of procedure:  Tolerated well, no immediate complications     Initial Impression / Assessment and Plan / ED Course  73 y.o. female who presents to the ED for AMS.  On exam, she moans in pain but does not provide any meaningful history.  She has a  large, deep, unstageable sacral decubitus ulcer with likely bony exposure and therefore concern for possible underlying osteomyelitis.  There is purulent packing in place, significantly malodorous.  On Foley insertion, there is concern for stool like appearance, raising concern for potential fistula anatomy versus significant UTI.  Obtaining broad-spectrum labs, including cultures.  Will obtain CT imaging of the head given AMS and abdomen/pelvis for further characterization of her ulcer.  Will initiate fluids, broad-spectrum antibiotics.  Repeat vitals reveal hypothermia, as well as hypotension.  Despite multiple attempts at IV access by RN, IV team, unable to obtain adequate access for fluids, antibiotics, and pressor support.  Central line placed for access, as above.  Work-up reveals: urinalysis overwhelmingly positive for UTI.  Leukocytosis to 12.  Lactic acid 2.1.  Significant AKI, creatinine 3.69 from ~1 previously, BUN 169.  CT abdomen/pelvis scan concerning for osteomyelitis. Receiving fluids, antibiotics, pressor support. Patient also had an episode of somewhat rhythmic bilateral arm twitching, and eyebrow twitching.  Given Ativan in case of seizure-like activity, with seemingly good response.  Attempted to call patient's points of contact per paperwork from her facility, her brother and her sister, to update on patient's clinical status, however unable to reach either.  Discussed with hospitalist for admission.     Final Clinical Impression(s) / ED Diagnosis  Final diagnoses:  Pressure injury of sacral region, unstageable ([place])  Altered mental status, unspecified altered mental status type  Sepsis, due to unspecified organism, unspecified whether acute organ dysfunction present ([place])  AKI (acute kidney injury) ([place])  Urinary tract infection in elderly patient       Note:  This document was prepared using Dragon voice recognition software and may include unintentional dictation errors.     Lilia Pro., MD 05/25/19 2130    Lilia Pro., MD 05/25/19 2200

## 2019-05-25 NOTE — ED Triage Notes (Signed)
Pt arrives from [facility] with c/o altered mental status and possible dehyrdation. Pamala Hurry from [facility] stated that pt has "huge" decubitis ulcer and that she has not been eating or drinking; has been pocketing meds and is not swallowing well. Pamala Hurry stated that pt did not have blood in her mouth when she left the facility.  Pt was seen by PA this morning, who said pt needed to be sent out because she "looks dehydrated."   Pt arrived with bloody mouth (she has no teeth), and blood that appeared to come from her vagina. Pt was turned for exam. Decubitis ulcer is unstageable. It was bandaged and filled with packing, which was pulled out by Dr. Joan Mayans for assessment.   Temp foley applied per Dr. Joan Mayans; urine returned was brown and appeared to have chunks in it.

## 2019-05-25 NOTE — ED Notes (Signed)
Date and time results received: 05/25/19 2228 (use smartphrase ".now" to insert current time)  Test: COVID Critical Value: positive  Name of Provider Notified: Joan Mayans, MD  Orders Received? Or Actions Taken?: informed

## 2019-05-25 NOTE — ED Notes (Signed)
Patient transported to CT 

## 2019-05-25 NOTE — ED Notes (Signed)
Unable to get blood cultures at this time, consulted IV team for access and will call phlebotomy for rest of labs

## 2019-05-26 ENCOUNTER — Inpatient Hospital Stay (HOSPITAL_COMMUNITY)
Admission: EM | Admit: 2019-05-26 | Discharge: 2019-05-30 | DRG: 871 | Disposition: A | Discharge status: Hospice | Payer: Medicare Other | Source: Other Acute Inpatient Hospital | Attending: Internal Medicine | Admitting: Internal Medicine

## 2019-05-26 DIAGNOSIS — L89150 Pressure ulcer of sacral region, unstageable: Secondary | ICD-10-CM | POA: Diagnosis not present

## 2019-05-26 DIAGNOSIS — R569 Unspecified convulsions: Secondary | ICD-10-CM | POA: Diagnosis present

## 2019-05-26 DIAGNOSIS — M1990 Unspecified osteoarthritis, unspecified site: Secondary | ICD-10-CM | POA: Diagnosis present

## 2019-05-26 DIAGNOSIS — K219 Gastro-esophageal reflux disease without esophagitis: Secondary | ICD-10-CM | POA: Diagnosis present

## 2019-05-26 DIAGNOSIS — I110 Hypertensive heart disease with heart failure: Secondary | ICD-10-CM | POA: Diagnosis not present

## 2019-05-26 DIAGNOSIS — N183 Chronic kidney disease, stage 3 unspecified: Secondary | ICD-10-CM | POA: Diagnosis present

## 2019-05-26 DIAGNOSIS — E860 Dehydration: Secondary | ICD-10-CM | POA: Diagnosis present

## 2019-05-26 DIAGNOSIS — Z6841 Body Mass Index (BMI) 40.0 and over, adult: Secondary | ICD-10-CM | POA: Diagnosis not present

## 2019-05-26 DIAGNOSIS — I130 Hypertensive heart and chronic kidney disease with heart failure and stage 1 through stage 4 chronic kidney disease, or unspecified chronic kidney disease: Secondary | ICD-10-CM | POA: Diagnosis present

## 2019-05-26 DIAGNOSIS — J9611 Chronic respiratory failure with hypoxia: Secondary | ICD-10-CM | POA: Diagnosis present

## 2019-05-26 DIAGNOSIS — Z7901 Long term (current) use of anticoagulants: Secondary | ICD-10-CM | POA: Diagnosis not present

## 2019-05-26 DIAGNOSIS — Z9981 Dependence on supplemental oxygen: Secondary | ICD-10-CM | POA: Diagnosis not present

## 2019-05-26 DIAGNOSIS — J449 Chronic obstructive pulmonary disease, unspecified: Secondary | ICD-10-CM | POA: Diagnosis present

## 2019-05-26 DIAGNOSIS — Z66 Do not resuscitate: Secondary | ICD-10-CM | POA: Diagnosis present

## 2019-05-26 DIAGNOSIS — A419 Sepsis, unspecified organism: Secondary | ICD-10-CM | POA: Diagnosis present

## 2019-05-26 DIAGNOSIS — R579 Shock, unspecified: Secondary | ICD-10-CM | POA: Diagnosis present

## 2019-05-26 DIAGNOSIS — Z515 Encounter for palliative care: Secondary | ICD-10-CM | POA: Diagnosis not present

## 2019-05-26 DIAGNOSIS — E119 Type 2 diabetes mellitus without complications: Secondary | ICD-10-CM | POA: Diagnosis not present

## 2019-05-26 DIAGNOSIS — G9340 Encephalopathy, unspecified: Secondary | ICD-10-CM | POA: Diagnosis present

## 2019-05-26 DIAGNOSIS — R680 Hypothermia, not associated with low environmental temperature: Secondary | ICD-10-CM | POA: Diagnosis present

## 2019-05-26 DIAGNOSIS — I5032 Chronic diastolic (congestive) heart failure: Secondary | ICD-10-CM | POA: Diagnosis present

## 2019-05-26 DIAGNOSIS — Z451 Encounter for adjustment and management of infusion pump: Secondary | ICD-10-CM | POA: Diagnosis not present

## 2019-05-26 DIAGNOSIS — Z833 Family history of diabetes mellitus: Secondary | ICD-10-CM

## 2019-05-26 DIAGNOSIS — E861 Hypovolemia: Secondary | ICD-10-CM | POA: Diagnosis present

## 2019-05-26 DIAGNOSIS — G4733 Obstructive sleep apnea (adult) (pediatric): Secondary | ICD-10-CM | POA: Diagnosis present

## 2019-05-26 DIAGNOSIS — U071 COVID-19: Secondary | ICD-10-CM | POA: Diagnosis present

## 2019-05-26 DIAGNOSIS — R6521 Severe sepsis with septic shock: Secondary | ICD-10-CM | POA: Diagnosis present

## 2019-05-26 DIAGNOSIS — J9612 Chronic respiratory failure with hypercapnia: Secondary | ICD-10-CM | POA: Diagnosis present

## 2019-05-26 DIAGNOSIS — M2550 Pain in unspecified joint: Secondary | ICD-10-CM | POA: Diagnosis not present

## 2019-05-26 DIAGNOSIS — E669 Obesity, unspecified: Secondary | ICD-10-CM | POA: Diagnosis present

## 2019-05-26 DIAGNOSIS — Z7401 Bed confinement status: Secondary | ICD-10-CM | POA: Diagnosis not present

## 2019-05-26 DIAGNOSIS — I5020 Unspecified systolic (congestive) heart failure: Secondary | ICD-10-CM | POA: Diagnosis not present

## 2019-05-26 DIAGNOSIS — Z881 Allergy status to other antibiotic agents status: Secondary | ICD-10-CM

## 2019-05-26 DIAGNOSIS — L89154 Pressure ulcer of sacral region, stage 4: Secondary | ICD-10-CM | POA: Diagnosis present

## 2019-05-26 DIAGNOSIS — Z86711 Personal history of pulmonary embolism: Secondary | ICD-10-CM

## 2019-05-26 DIAGNOSIS — N179 Acute kidney failure, unspecified: Secondary | ICD-10-CM | POA: Diagnosis present

## 2019-05-26 DIAGNOSIS — E872 Acidosis: Secondary | ICD-10-CM | POA: Diagnosis present

## 2019-05-26 DIAGNOSIS — E785 Hyperlipidemia, unspecified: Secondary | ICD-10-CM | POA: Diagnosis present

## 2019-05-26 DIAGNOSIS — Z8249 Family history of ischemic heart disease and other diseases of the circulatory system: Secondary | ICD-10-CM

## 2019-05-26 DIAGNOSIS — N390 Urinary tract infection, site not specified: Secondary | ICD-10-CM | POA: Diagnosis not present

## 2019-05-26 DIAGNOSIS — Z87891 Personal history of nicotine dependence: Secondary | ICD-10-CM

## 2019-05-26 DIAGNOSIS — M869 Osteomyelitis, unspecified: Secondary | ICD-10-CM | POA: Diagnosis present

## 2019-05-26 DIAGNOSIS — Z818 Family history of other mental and behavioral disorders: Secondary | ICD-10-CM

## 2019-05-26 DIAGNOSIS — Z452 Encounter for adjustment and management of vascular access device: Secondary | ICD-10-CM | POA: Diagnosis not present

## 2019-05-26 DIAGNOSIS — I739 Peripheral vascular disease, unspecified: Secondary | ICD-10-CM | POA: Diagnosis present

## 2019-05-26 DIAGNOSIS — M4628 Osteomyelitis of vertebra, sacral and sacrococcygeal region: Secondary | ICD-10-CM | POA: Diagnosis not present

## 2019-05-26 DIAGNOSIS — Z888 Allergy status to other drugs, medicaments and biological substances status: Secondary | ICD-10-CM

## 2019-05-26 DIAGNOSIS — Z886 Allergy status to analgesic agent status: Secondary | ICD-10-CM

## 2019-05-26 DIAGNOSIS — G8929 Other chronic pain: Secondary | ICD-10-CM | POA: Diagnosis present

## 2019-05-26 DIAGNOSIS — G43909 Migraine, unspecified, not intractable, without status migrainosus: Secondary | ICD-10-CM | POA: Diagnosis present

## 2019-05-26 LAB — COMPREHENSIVE METABOLIC PANEL
ALT: 11 U/L (ref 0–44)
AST: 13 U/L — ABNORMAL LOW (ref 15–41)
Albumin: 1.8 g/dL — ABNORMAL LOW (ref 3.5–5.0)
Alkaline Phosphatase: 77 U/L (ref 38–126)
Anion gap: 14 (ref 5–15)
BUN: 145 mg/dL — ABNORMAL HIGH (ref 8–23)
CO2: 20 mmol/L — ABNORMAL LOW (ref 22–32)
Calcium: 8.1 mg/dL — ABNORMAL LOW (ref 8.9–10.3)
Chloride: 105 mmol/L (ref 98–111)
Creatinine, Ser: 3.07 mg/dL — ABNORMAL HIGH (ref 0.44–1.00)
GFR calc Af Amer: 17 mL/min — ABNORMAL LOW (ref >60)
GFR calc non Af Amer: 15 mL/min — ABNORMAL LOW (ref >60)
Glucose, Bld: 174 mg/dL — ABNORMAL HIGH (ref 70–99)
Potassium: 3.9 mmol/L (ref 3.5–5.1)
Sodium: 139 mmol/L (ref 135–145)
Total Bilirubin: 1.2 mg/dL (ref 0.3–1.2)
Total Protein: 5.8 g/dL — ABNORMAL LOW (ref 6.5–8.1)

## 2019-05-26 LAB — CBC
HCT: 34.3 % — ABNORMAL LOW (ref 36.0–46.0)
Hemoglobin: 10.8 g/dL — ABNORMAL LOW (ref 12.0–15.0)
MCH: 28.9 pg (ref 26.0–34.0)
MCHC: 31.5 g/dL (ref 30.0–36.0)
MCV: 91.7 fL (ref 80.0–100.0)
Platelets: 33 10*3/uL — ABNORMAL LOW (ref 150–400)
RBC: 3.74 MIL/uL — ABNORMAL LOW (ref 3.87–5.11)
RDW: 16.7 % — ABNORMAL HIGH (ref 11.5–15.5)
WBC: 14.2 10*3/uL — ABNORMAL HIGH (ref 4.0–10.5)
nRBC: 0.1 % (ref 0.0–0.2)

## 2019-05-26 LAB — SEDIMENTATION RATE: Sed Rate: 79 mm/hr — ABNORMAL HIGH (ref 0–30)

## 2019-05-26 LAB — MRSA PCR SCREENING: MRSA by PCR: NEGATIVE

## 2019-05-26 LAB — GLUCOSE, CAPILLARY
Glucose-Capillary: 156 mg/dL — ABNORMAL HIGH (ref 70–99)
Glucose-Capillary: 182 mg/dL — ABNORMAL HIGH (ref 70–99)
Glucose-Capillary: 187 mg/dL — ABNORMAL HIGH (ref 70–99)

## 2019-05-26 LAB — MAGNESIUM: Magnesium: 2.9 mg/dL — ABNORMAL HIGH (ref 1.7–2.4)

## 2019-05-26 LAB — LACTIC ACID, PLASMA
Lactic Acid, Venous: 1 mmol/L (ref 0.5–1.9)
Lactic Acid, Venous: 0.9 mmol/L (ref 0.5–1.9)

## 2019-05-26 LAB — C-REACTIVE PROTEIN: CRP: 16 mg/dL — ABNORMAL HIGH (ref <1.0)

## 2019-05-26 LAB — PHOSPHORUS: Phosphorus: 4.8 mg/dL — ABNORMAL HIGH (ref 2.5–4.6)

## 2019-05-26 LAB — CK: Total CK: 78 U/L (ref 38–234)

## 2019-05-26 LAB — HIV ANTIBODY (ROUTINE TESTING W REFLEX): HIV Screen 4th Generation wRfx: NONREACTIVE

## 2019-05-26 MED ORDER — SODIUM CHLORIDE 0.9 % IV SOLN
INTRAVENOUS | Status: DC | PRN
  Administered 2019-05-27: 06:00:00 via INTRA_ARTERIAL

## 2019-05-26 MED ORDER — IPRATROPIUM-ALBUTEROL 0.5-2.5 (3) MG/3ML IN SOLN: 3.0000 mL | Freq: Four times a day (QID) | RESPIRATORY_TRACT | Status: DC | PRN

## 2019-05-26 MED ORDER — VANCOMYCIN HCL 10 G IV SOLR
1500.0000 mg | Freq: Once | INTRAVENOUS | Status: AC
  Administered 2019-05-26: 1500 mg via INTRAVENOUS
  Filled 2019-05-26: qty 1500

## 2019-05-26 MED ORDER — SODIUM CHLORIDE 0.9 % IV BOLUS
1000.0000 mL | Freq: Once | INTRAVENOUS | Status: AC
  Administered 2019-05-26: 1000 mL via INTRAVENOUS

## 2019-05-26 MED ORDER — BUDESONIDE 0.5 MG/2ML IN SUSP: 0.5000 mg | Freq: Two times a day (BID) | RESPIRATORY_TRACT | Status: DC

## 2019-05-26 MED ORDER — HEPARIN SODIUM (PORCINE) 5000 UNIT/ML IJ SOLN: 5000.0000 [IU] | Freq: Three times a day (TID) | INTRAMUSCULAR | Status: DC

## 2019-05-26 MED ORDER — SODIUM CHLORIDE 0.9 % IV BOLUS
500.0000 mL | Freq: Once | INTRAVENOUS | Status: AC
  Administered 2019-05-26: 500 mL via INTRAVENOUS

## 2019-05-26 MED ORDER — ALBUMIN HUMAN 25 % IV SOLN
25.0000 g | Freq: Four times a day (QID) | INTRAVENOUS | Status: AC
  Administered 2019-05-26 – 2019-05-27 (×4): 25 g via INTRAVENOUS
  Filled 2019-05-26: qty 100
  Filled 2019-05-26: qty 50
  Filled 2019-05-26: qty 100
  Filled 2019-05-26 (×2): qty 50

## 2019-05-26 MED ORDER — COLLAGENASE 250 UNIT/GM EX OINT
1.0000 "application " | TOPICAL_OINTMENT | Freq: Every day | CUTANEOUS | Status: DC
  Filled 2019-05-26: qty 30

## 2019-05-26 MED ORDER — ORAL CARE MOUTH RINSE
15.0000 mL | Freq: Two times a day (BID) | OROMUCOSAL | Status: DC
  Administered 2019-05-26 – 2019-05-30 (×7): 15 mL via OROMUCOSAL

## 2019-05-26 MED ORDER — FLUTICASONE FUROATE-VILANTEROL 100-25 MCG/INH IN AEPB: 1.0000 | INHALATION_SPRAY | Freq: Every day | RESPIRATORY_TRACT | Status: DC

## 2019-05-26 MED ORDER — LACTATED RINGERS IV SOLN
INTRAVENOUS | Status: AC
  Administered 2019-05-26: 17:00:00 via INTRAVENOUS

## 2019-05-26 MED ORDER — VANCOMYCIN VARIABLE DOSE PER UNSTABLE RENAL FUNCTION (PHARMACIST DOSING): Status: DC

## 2019-05-26 MED ORDER — NOREPINEPHRINE 16 MG/250ML-% IV SOLN
0.0000 ug/min | INTRAVENOUS | Status: DC
  Administered 2019-05-26: 40 ug/min via INTRAVENOUS
  Administered 2019-05-27: 01:00:00 13 ug/min via INTRAVENOUS
  Filled 2019-05-26 (×2): qty 250

## 2019-05-26 MED ORDER — LORAZEPAM 2 MG/ML IJ SOLN: 2.0000 mg | INTRAMUSCULAR | Status: DC | PRN

## 2019-05-26 MED ORDER — ARFORMOTEROL TARTRATE 15 MCG/2ML IN NEBU
15.0000 ug | INHALATION_SOLUTION | Freq: Two times a day (BID) | RESPIRATORY_TRACT | Status: DC
  Filled 2019-05-26 (×4): qty 2

## 2019-05-26 MED ORDER — CHLORHEXIDINE GLUCONATE CLOTH 2 % EX PADS
6.0000 | MEDICATED_PAD | Freq: Every day | CUTANEOUS | Status: DC
  Administered 2019-05-26: 6 via TOPICAL

## 2019-05-26 MED ORDER — MORPHINE SULFATE (PF) 2 MG/ML IV SOLN
2.0000 mg | INTRAVENOUS | Status: DC | PRN
  Administered 2019-05-26: 2 mg via INTRAVENOUS
  Filled 2019-05-26: qty 1

## 2019-05-26 MED ORDER — SODIUM CHLORIDE 0.9 % IV SOLN
INTRAVENOUS | Status: DC
  Administered 2019-05-26: 07:00:00 via INTRAVENOUS

## 2019-05-26 MED ORDER — FENTANYL CITRATE (PF) 100 MCG/2ML IJ SOLN
25.0000 ug | INTRAMUSCULAR | Status: DC | PRN
  Administered 2019-05-26: 25 ug via INTRAVENOUS
  Filled 2019-05-26: qty 2

## 2019-05-26 MED ORDER — HYDROMORPHONE HCL 1 MG/ML IJ SOLN
0.5000 mg | INTRAMUSCULAR | Status: DC | PRN
  Administered 2019-05-26 – 2019-05-28 (×6): 0.5 mg via INTRAVENOUS
  Filled 2019-05-26 (×6): qty 0.5

## 2019-05-26 MED ORDER — HEPARIN SODIUM (PORCINE) 5000 UNIT/ML IJ SOLN
10000.0000 [IU] | Freq: Three times a day (TID) | INTRAMUSCULAR | Status: DC
  Filled 2019-05-26: qty 2

## 2019-05-26 MED ORDER — PIPERACILLIN-TAZOBACTAM 3.375 G IVPB
3.3750 g | Freq: Three times a day (TID) | INTRAVENOUS | Status: DC
  Administered 2019-05-26 – 2019-05-28 (×7): 3.375 g via INTRAVENOUS
  Filled 2019-05-26 (×8): qty 50

## 2019-05-26 MED ORDER — MORPHINE SULFATE (PF) 2 MG/ML IV SOLN: 2.0000 mg | INTRAVENOUS | Status: DC | PRN

## 2019-05-26 MED ORDER — ARFORMOTEROL TARTRATE 15 MCG/2ML IN NEBU
15.0000 ug | INHALATION_SOLUTION | Freq: Two times a day (BID) | RESPIRATORY_TRACT | Status: DC
  Filled 2019-05-26: qty 2

## 2019-05-26 MED ORDER — NOREPINEPHRINE 4 MG/250ML-% IV SOLN
0.0000 ug/min | INTRAVENOUS | Status: DC
  Administered 2019-05-26: 25 ug/min via INTRAVENOUS
  Administered 2019-05-26: 6 ug/min via INTRAVENOUS
  Administered 2019-05-26: 5 ug/min via INTRAVENOUS
  Administered 2019-05-26: 21 ug/min via INTRAVENOUS
  Filled 2019-05-26 (×2): qty 250

## 2019-05-26 MED ORDER — HYDROCORTISONE NA SUCCINATE PF 100 MG IJ SOLR
50.0000 mg | Freq: Four times a day (QID) | INTRAMUSCULAR | Status: DC
  Administered 2019-05-26 – 2019-05-28 (×7): 50 mg via INTRAVENOUS
  Filled 2019-05-26 (×7): qty 2

## 2019-05-26 MED ORDER — VASOPRESSIN 20 UNIT/ML IV SOLN
0.0300 [IU]/min | INTRAVENOUS | Status: DC
  Administered 2019-05-26: 0.04 [IU]/min via INTRAVENOUS
  Filled 2019-05-26: qty 2

## 2019-05-26 NOTE — ED Notes (Signed)
Patient unable to sign for transfer consent due to altered mental status, no family could be reached

## 2019-05-26 NOTE — H&P (Addendum)
NAME:  Janice Morrow, MRN:  [id number], DOB:  15-Oct-1945, LOS: 0 ADMISSION DATE:  05/26/2019, CONSULTATION DATE:  05/26/2019 REFERRING MD:  Dr. Joan Mayans, CHIEF COMPLAINT:  Encephalopathy, Septic Shock    History of present illness   73 year old female presents from SNF with AMS and hypotension. Per report patient appears dehydrated and has not been eating or drinking, along with pocketing medications. On arrival to ED patient is moaning, alert, but unable to provide history. On exam has a large, deep, unstageable sacral decubitus ulcer with possible bony exposure concerning for osteomyelitis. PAN Cultured. U/A concerning for UTI. Given Rocephin/Flagyl/Vancomycin. CT Head Negative for acute. Hypotension despite 5L Bolus. Started on Levophed/Vasopressin. LA 2.1. WBC 12. Crt 3.69. BUN 169. CTA A/P concerning for Osteomyelitis. While in ED had a witnessed episode of rhythmic bilateral arm twitching, and eyebrow twitching. Given Ativan with good response. Tested positive for COVID. Due to acute seizure accepted to Critical Care Service at [facility].   Of note admission May 2020 patient admitted with E.Coli UTI. Case management involved as patient lives at hotel with brother. Discharged back to hotel with home health care as patient refused SNF. Days later Brother called back days later and reported that he could not care for her. Patient was sent to [facility] for rehab. Transferred to [facility] for SNF 02/2019.    Past Medical History  May 2020 with E.Coli UTI secondary Pyelonephritis, OSA, COPD on 4L [place], Prior PE on Eliquis, HTN, HLD, CKD, Grade 1 Diastolic HF with EF [id number]    [facility] Events   9/28 > Presents to ED   Consults:  PCCM  Procedures:  Left Femoral CVC (Placed OSH) 9/28 >>   Significant Diagnostic Tests:  CT Head 9/28 > 1. No acute intracranial abnormality. Negative for age non contrast CT appearance of the brain. 2. Mild sphenoid sinus inflammation is new  since 2019. CT A/P 9/28 > 1. Large sacral decubitus wound/ulcer, extends down to the bony surface of the sacrum. When compared to the most recent prior CT, coccygeal bones are no longer visualized, presumably due to osteomyelitis. No gross focal fluid collection to suggest abscess 2. Gallstone 3. Perinephric edema and scarring/atrophy upper pole left kidney as before. Multiple stones and calcifications in the upper pole of the left kidney CXR 9/28 > Stable cardiomediastinal contours with enlarged heart size. Unchanged bilateral hilar enlargement consistent with enlarged pulmonary arteries. There are prominent bilateral interstitial markings without a new focal pulmonary opacity. No pneumothorax or large pleural effusion. No acute findings in the visualized skeleton.  Micro Data:  Blood 9/28 >> Urine 9/28 >> U/A 9/28 > Turbid, Brown, Many Bacteria, Small Leukocytes, Negative Nitrites   Antimicrobials:  Cefepime 9/28  Flagyl 9/28  Vancomycin 9/28    Interim history/subjective:  As above   Objective   Last menstrual period 08/27/1993.       No intake or output data in the 24 hours ending 05/26/19 0529 There were no vitals filed for this visit.  Examination: General: Obese adult female, lying in bed  HENT: Dry MM  Lungs: Rhonchi, no wheeze/crackles  Cardiovascular: RRR, no MRG Abdomen: ,  Extremities: Chronic Lymphedema Bilateral LE >> painful to touch  Neuro: Alert, confused, incomprehensible speech  GU: Foley in place  Wound: Noted large [facility] Problem list    Assessment & Plan:   Encephalopathy presumed secondary to uremia -Noted Seizure in ED at OSH >> ?? secondary to uremia  -  Head CT negative acute -UDS/ETOH negative Plan -Frequent Neuro Exams  -Ativan PRN for Seizures >>> no further episodes documented or witnessed   -Would Consider Neurology consult if another episode occurs   Hypotension in setting of Hypovolemia and Septic  Shock Diastolic Heart Failure (EF 40-45)  H/O HTN, HLD, Pulmonary Embolism on Eliquis  Plan  -Cardiac Monitoring  -Currently on Levophed/Vasopressin, Titrate for MAP goal >65 -Hold Coreg and lasix at this time due to AKI and hypotension   Chronic Hypoxic/Hypercarbic Respiratory Failure in setting of COPD (on 4L [place] at baseline) and OSA (reported back in May 2020 not wearing CPAP) -ABG 7.35/44/84  COVID +  Plan  -Titrate Supplemental Oxygen to Maintain Saturation >88 -Scheduled and PRN Nebs -Pulmonary Hygiene   Anion Gap Metabolic Acidosis, Lactic Acidosis  Acute on Chronic Renal Diease (Baseline Crt 09-1.1) -CK 75  -LA 2.1  Plan -S/P 5L Bolus >> Still Appears Volume Down  >> Send Repeat Labs now  -Trend LA  -Trend BMP -Replace electrolytes   Hypothermia, Concern for sepsis secondary to UTI vs Decub with Osteo -CTA with multiple stones and calcifications in upper pole of left kidney  -H/O E.Coli UTI  Plan  -Follow Culture Data -Zosyn/Vancomycin for now to cover Osteo and UTI  -Needs Surgical Consult for possible debridement   -Wound Care Consult   Best practice:  Diet: NPO DVT prophylaxis: Heparin SQ  GI prophylaxis: PPI Glucose control: Trend Glucose  Mobility: Bedrest  Code Status: FC  Family Communication: Unable to contact family  Disposition:   Labs   CBC: Recent Labs  Lab 05/25/19 1747  WBC 12.0*  NEUTROABS 10.0*  HGB 12.0  HCT 38.9  MCV 88.6  PLT 86*    Basic Metabolic Panel: Recent Labs  Lab 05/25/19 1747  NA 136  K 4.9  CL 94*  CO2 26  GLUCOSE 133*  BUN 169*  CREATININE 3.69*  CALCIUM 9.6   GFR: Estimated Creatinine Clearance: 21.3 mL/min (A) (by C-G formula based on SCr of 3.69 mg/dL (H)). Recent Labs  Lab 05/25/19 1747 05/25/19 1831  WBC 12.0*  --   LATICACIDVEN  --  2.1*    Liver Function Tests: Recent Labs  Lab 05/25/19 1747  AST 19  ALT 12  ALKPHOS 83  BILITOT 1.5*  PROT 6.8  ALBUMIN 2.5*   No results for  input(s): LIPASE, AMYLASE in the last 168 hours. No results for input(s): AMMONIA in the last 168 hours.  ABG    Component Value Date/Time   PHART 7.35 05/25/2019 2338   PCO2ART 44 05/25/2019 2338   PO2ART 84 05/25/2019 2338   HCO3 24.8 05/25/2019 2338   TCO2 30 09/30/2017 2312   ACIDBASEDEF 1.5 05/25/2019 2338   O2SAT 96.5 05/25/2019 2338     Coagulation Profile: Recent Labs  Lab 05/25/19 1747  INR 1.5*    Cardiac Enzymes: Recent Labs  Lab 05/25/19 1747  CKTOTAL 78    HbA1C: Hemoglobin A1C  Date/Time Value Ref Range Status  08/12/2018 02:09 PM 6.1 (A) 4.0 - 5.6 % Final  01/28/2018 02:23 PM 5.8 (A) 4.0 - 5.6 % Final   Hgb A1c MFr Bld  Date/Time Value Ref Range Status  05/20/2017 05:05 PM 6.1 4.6 - 6.5 % Final    Comment:    Glycemic Control Guidelines for People with Diabetes:Non Diabetic:  <6%Goal of Therapy: <7%Additional Action Suggested:  >8%   01/30/2017 04:29 PM 6.5 4.6 - 6.5 % Final    Comment:  Glycemic Control Guidelines for People with Diabetes:Non Diabetic:  <6%Goal of Therapy: <7%Additional Action Suggested:  >8%     CBG: Recent Labs  Lab 05/26/19 0350  GLUCAP 156*    Review of Systems:   Unable to review as patient is encephalopathic   Past Medical History  She,  has a past medical history of Acute diastolic CHF (congestive heart failure) ([place]) (08/24/2015), Cervicalgia, Chronic back pain, COPD (chronic obstructive pulmonary disease) ([place]), Essential hypertension (08/24/2015), GERD (gastroesophageal reflux disease), Headache(784.0), Heart murmur, History of kidney stones, Hyperlipidemia, Migraine without aura, with intractable migraine, so stated, without mention of status migrainosus, OSA on CPAP, Osteoarthritis, Pulmonary embolism ([place]), PVD (peripheral vascular disease) ([place]), Seizures ([place]) (07/2014, 10/2014), Shortness of breath dyspnea, and Trigeminal neuralgia.   Surgical History    Past Surgical History:  Procedure Laterality Date  .  LEFT HEART CATH AND CORONARY ANGIOGRAPHY N/A 10/01/2017   Procedure: LEFT HEART CATH AND CORONARY ANGIOGRAPHY;  Surgeon: Leonie Man, MD;  Location: [facility];  Service: Cardiovascular;  Laterality: N/A;  . MULTIPLE TOOTH EXTRACTIONS     "they took out part of my teeth; I took out the rest when they got loose; was suppose to get dentures; never did"  . NO PAST SURGERIES    . URETEROSCOPY WITH HOLMIUM LASER LITHOTRIPSY Left 07/09/2017   Procedure: URETEROSCOPY WITH HOLMIUM LASER LITHOTRIPSY/ STENT;  Surgeon: Festus Aloe, MD;  Location: [facility];  Service: Urology;  Laterality: Left;  . URETEROSCOPY WITH HOLMIUM LASER LITHOTRIPSY Left 07/23/2017   Procedure: URETEROSCOPY WITH HOLMIUM LASER LITHOTRIPSY /STENT;  Surgeon: Festus Aloe, MD;  Location: [facility];  Service: Urology;  Laterality: Left;  NEEDS 60 MINUTES FOR PROCEDURE     Social History   reports that she quit smoking about 8 years ago. Her smoking use included cigarettes. She has a 9.00 pack-year smoking history. She has never used smokeless tobacco. She reports that she does not drink alcohol or use drugs.   Family History   Her family history includes Dementia in her mother; Diabetes in her brother, mother, and another family member; Heart attack in her father; Heart disease in an other family member.   Allergies Allergies  Allergen Reactions  . Baclofen Other (See Comments)    Causes seizures  . Imdur [Isosorbide Dinitrate] Other (See Comments)    Severe persistent headache  . Naproxen Palpitations     Home Medications  Prior to Admission medications   Medication Sig Start Date End Date Taking? Authorizing Provider  carvedilol (COREG) 6.25 MG tablet Take 1 tablet (6.25 mg total) by mouth 2 (two) times daily with a meal. 01/15/19  Yes Samtani, Jai-Gurmukh, MD  ELIQUIS 5 MG TABS tablet TAKE 1 TABLET BY MOUTH TWICE A DAY Patient taking differently: Take 5 mg by mouth 2 (two) times daily.  04/28/18  Yes Janith Lima, MD  gabapentin (NEURONTIN) 400 MG capsule Take 1 capsule (400 mg total) by mouth 3 (three) times daily. 01/15/19  Yes Nita Sells, MD  furosemide (LASIX) 40 MG tablet TAKE 1 TABLET (40 MG TOTAL) BY MOUTH DAILY. Patient not taking: Reported on 01/10/2019 04/20/18   Janith Lima, MD  ipratropium-albuterol (DUONEB) 0.5-2.5 (3) MG/3ML SOLN Take 3 mLs by nebulization 3 (three) times daily. Patient not taking: Reported on 01/10/2019 04/14/18   Janith Lima, MD  OXYGEN Inhale 4 L into the lungs continuous.     [provider]  umeclidinium-vilanterol (ANORO ELLIPTA) 62.5-25 MCG/INH AEPB Inhale 1 puff  into the lungs daily. Patient not taking: Reported on 01/10/2019 08/12/18   Janith Lima, MD     Critical care time: 62 minutes    Patient seen at the bedside, patient discussed at length with nurse practitioner Michaele Offer reviewed her assessment and plan above and agree.  Over 62 minutes was spent in evaluation and critical care planning.  Will change cefepime to Zosyn ask for surgical evaluation of wound along with wound care and await blood cultures. Dr Laurelyn Sickle MD  Hayden Pedro, AGACNP-BC [facility] Pgr: [phone]

## 2019-05-26 NOTE — Consult Note (Signed)
[facility] Nurse wound consult note Reason for Consult: [facility] team is working remotely today; reviewed progress notes. Consult requested for buttocks/sacrum.  Pt is noted to have an extensive unstageable pressure injury to buttocks with probable osteomyelitis.  This complex medical condition is beyond the scope of practice for [facility] nursing.  Please request a surgical consult for debridement and further plan of care.  Dressing procedure/placement/frequency: Topical treatment orders placed for bedside nurses to apply moist gauze dressings until further orders are available.  Pt is on a low airloss mattress in ICU. Please re-consult if further assistance is needed.  Thank-you,  Julien Girt MSN, [place], [place], [place], [place]

## 2019-05-26 NOTE — Progress Notes (Signed)
Pharmacy Antibiotic Note  Janice Morrow is a 73 y.o. female admitted on 05/26/2019 with AMS/hypotension/sepsis and likely osteomyelitis .  Pharmacy has been consulted for Vancomycin and Zosyn dosing. Vancomycin 1 g IV given in ED at  2100 Plan: Vancomycin 1500 mg IV now for total of 2500 mg tonight, and redose as indicated by renal fxn Zosyn 3.375 g IV q8h      Temp (24hrs), Avg:96 F (35.6 C), Min:95.1 F (35.1 C), Max:97.5 F (36.4 C)  Recent Labs  Lab 05/25/19 1747 05/25/19 1831  WBC 12.0*  --   CREATININE 3.69*  --   LATICACIDVEN  --  2.1*    Estimated Creatinine Clearance: 21.3 mL/min (A) (by C-G formula based on SCr of 3.69 mg/dL (H)).    Allergies  Allergen Reactions  . Baclofen Other (See Comments)    Causes seizures  . Imdur [Isosorbide Dinitrate] Other (See Comments)    Severe persistent headache  . Naproxen Palpitations    [name] 05/26/2019 5:46 AM

## 2019-05-26 NOTE — Progress Notes (Signed)
eLink Physician-Brief Progress Note Patient Name: Janice Morrow DOB: February 02, 1946 MRN: [id number]   Date of Service  05/26/2019  HPI/Events of Note  22 F history of hypertension, dyslipidemia, combined HF EF 40-45%, COPD on home O2, CKD. She was brought to [facility] ED from her assisted living center due to altered sensorium. Hypotensive and tachycardic.  CT scan showed the large decubitus ulcer with osteomyelitis with coccygeal bone no longer identfied. Also with left perinephric edema.  She is COVID positive. O2 requirement at baseline.  eICU Interventions  On vancomycin and piperacillin-tazobactam Acute on CKD, fluids given Will need surgical evaluation regarding decubitus/osteomyelitis     Intervention Category Major Interventions: Sepsis - evaluation and management;Change in mental status - evaluation and management;Shock - evaluation and management;Seizures - evaluation and management;Infection - evaluation and management Evaluation Type: New Patient Evaluation  Judd Lien 05/26/2019, 5:35 AM

## 2019-05-26 NOTE — Progress Notes (Signed)
Assisted tele visit to patient with provider - Raymond Gurney from [facility].  Adella Hare RN.

## 2019-05-26 NOTE — ED Notes (Signed)
This RN used warm wipes to clean patient, MASD noted under bilateral breasts with significant cracking under L breast. Patient slightly more responsive than prior, with eye opening to speech and withdrawal from pain

## 2019-05-26 NOTE — ED Notes (Signed)
[facility] here to transport patient, report given, Patent examiner notified of transport arrival

## 2019-05-26 NOTE — Consult Note (Signed)
[facility] Surgery Consult Note  [name] [birth year]  [id number].    Requesting MD: Laurelyn Sickle Chief Complaint/Reason for Consult: sacral wound  HPI:  Janice Morrow is a 73yo female PMH COPD on 4L [place] at baseline, OSA, diastolic heart failure (EF 40-45%), HTN, HLD, PE on Eliquis (unknown last dose, and COVID+, who presented to [facility] last night from SNF with AMS and hypotension. On exam she was found to have a large, deep, unstageable sacral decubitus ulcer with possible bony exposure concerning for osteomyelitis. PAN Cultured. U/A concerning for UTI. CT abdomen/pelvis shows large sacral decubitus wound extending down to bone with suspected osteomyelitis but no signs of abscess. She was started on Rocephin/Flagyl/Vancomycin and admitted to CCM. She is currently requiring levo for pressure support, oxygenating well on 2L [place]. General surgery asked to evaluate sacral wound.   ROS: Review of Systems  Unable to perform ROS: Mental acuity    Family History  Problem Relation Age of Onset   Heart attack Father    Diabetes Mother    Dementia Mother    Diabetes Brother    Heart disease Other    Diabetes Other     Past Medical History:  Diagnosis Date   Acute diastolic CHF (congestive heart failure) ([place]) 08/24/2015   Cervicalgia    Chronic back pain    COPD (chronic obstructive pulmonary disease) ([place])    on 4L home O2   Essential hypertension 08/24/2015   GERD (gastroesophageal reflux disease)    Headache(784.0)    Heart murmur    History of kidney stones    Hyperlipidemia    Migraine without aura, with intractable migraine, so stated, without mention of status migrainosus    OSA on CPAP    she no longer wears CPAP   Osteoarthritis    Pulmonary embolism (HCC)    PVD (peripheral vascular disease) ([place])    Seizures ([place]) 07/2014, 10/2014   due to baclofen   Shortness of breath dyspnea    Trigeminal neuralgia     Past Surgical History:    Procedure Laterality Date   LEFT HEART CATH AND CORONARY ANGIOGRAPHY N/A 10/01/2017   Procedure: LEFT HEART CATH AND CORONARY ANGIOGRAPHY;  Surgeon: Leonie Man, MD;  Location: [place];  Service: Cardiovascular;  Laterality: N/A;   MULTIPLE TOOTH EXTRACTIONS     "they took out part of my teeth; I took out the rest when they got loose; was suppose to get dentures; never did"   NO PAST SURGERIES     URETEROSCOPY WITH HOLMIUM LASER LITHOTRIPSY Left 07/09/2017   Procedure: URETEROSCOPY WITH HOLMIUM LASER LITHOTRIPSY/ STENT;  Surgeon: Festus Aloe, MD;  Location: [facility];  Service: Urology;  Laterality: Left;   URETEROSCOPY WITH HOLMIUM LASER LITHOTRIPSY Left 07/23/2017   Procedure: URETEROSCOPY WITH HOLMIUM LASER LITHOTRIPSY /STENT;  Surgeon: Festus Aloe, MD;  Location: [facility];  Service: Urology;  Laterality: Left;  NEEDS 60 MINUTES FOR PROCEDURE    Social History:  reports that she quit smoking about 8 years ago. Her smoking use included cigarettes. She has a 9.00 pack-year smoking history. She has never used smokeless tobacco. She reports that she does not drink alcohol or use drugs.  Allergies:  Allergies  Allergen Reactions   Baclofen Other (See Comments)    Causes seizures   Imdur [Isosorbide Dinitrate] Other (See Comments)    Severe persistent headache   Naproxen Palpitations    Medications Prior to Admission  Medication Sig Dispense Refill  carvedilol (COREG) 6.25 MG tablet Take 1 tablet (6.25 mg total) by mouth 2 (two) times daily with a meal. 60 tablet 3   ELIQUIS 5 MG TABS tablet TAKE 1 TABLET BY MOUTH TWICE A DAY (Patient taking differently: Take 5 mg by mouth 2 (two) times daily. ) 60 tablet 3   furosemide (LASIX) 40 MG tablet TAKE 1 TABLET (40 MG TOTAL) BY MOUTH DAILY. (Patient not taking: Reported on 01/10/2019) 90 tablet 1   gabapentin (NEURONTIN) 400 MG capsule Take 1 capsule (400 mg total) by mouth 3 (three) times daily. 30 capsule 0    ipratropium-albuterol (DUONEB) 0.5-2.5 (3) MG/3ML SOLN Take 3 mLs by nebulization 3 (three) times daily. (Patient not taking: Reported on 01/10/2019) 360 mL 3   OXYGEN Inhale 4 L into the lungs continuous.      umeclidinium-vilanterol (ANORO ELLIPTA) 62.5-25 MCG/INH AEPB Inhale 1 puff into the lungs daily. (Patient not taking: Reported on 01/10/2019) 90 each 1    Prior to Admission medications   Medication Sig Start Date End Date Taking? Authorizing Provider  carvedilol (COREG) 6.25 MG tablet Take 1 tablet (6.25 mg total) by mouth 2 (two) times daily with a meal. 01/15/19   Samtani, Jai-Gurmukh, MD  ELIQUIS 5 MG TABS tablet TAKE 1 TABLET BY MOUTH TWICE A DAY Patient taking differently: Take 5 mg by mouth 2 (two) times daily.  04/28/18   Janith Lima, MD  furosemide (LASIX) 40 MG tablet TAKE 1 TABLET (40 MG TOTAL) BY MOUTH DAILY. Patient not taking: Reported on 01/10/2019 04/20/18   Janith Lima, MD  gabapentin (NEURONTIN) 400 MG capsule Take 1 capsule (400 mg total) by mouth 3 (three) times daily. 01/15/19   Nita Sells, MD  ipratropium-albuterol (DUONEB) 0.5-2.5 (3) MG/3ML SOLN Take 3 mLs by nebulization 3 (three) times daily. Patient not taking: Reported on 01/10/2019 04/14/18   Janith Lima, MD  OXYGEN Inhale 4 L into the lungs continuous.     [provider]  umeclidinium-vilanterol (ANORO ELLIPTA) 62.5-25 MCG/INH AEPB Inhale 1 puff into the lungs daily. Patient not taking: Reported on 01/10/2019 08/12/18   Janith Lima, MD    Blood pressure (!) 150/103, pulse 80, temperature 97.9 F (36.6 C), temperature source Axillary, resp. rate 16, last menstrual period 08/27/1993, SpO2 94 %. Physical Exam: General: obese female who is laying in bed moaning in pain HEENT: head is normocephalic, atraumatic.  Sclera are noninjected.  Pupils equal and round.  Ears and nose without any masses or lesions.  Mouth is dry. Dentition poor Heart: Palpable pedal pulses bilaterally Lungs:  rate and effort normal Abd: obese, soft, NT/ND, no masses, hernias, or organomegaly MS: edema noted BLE. Calves soft and nontender Skin: warm and dry  Psych: Alert but only moans GU: large sacral wound that extends down to bone, necrotic foul smelling tissue noted mostly on left lateral aspect of wound, there is some undermining but I do not feel any pockets and do not see any purulent drainage     Results for orders placed or performed during the hospital encounter of 05/26/19 (from the past 48 hour(s))  CBC     Status: Abnormal   Collection Time: 05/26/19  5:33 AM  Result Value Ref Range   WBC 14.2 (H) 4.0 - 10.5 K/uL   RBC 3.74 (L) 3.87 - 5.11 MIL/uL   Hemoglobin 10.8 (L) 12.0 - 15.0 g/dL   HCT 34.3 (L) 36.0 - 46.0 %   MCV 91.7 80.0 - 100.0 fL  MCH 28.9 26.0 - 34.0 pg   MCHC 31.5 30.0 - 36.0 g/dL   RDW 16.7 (H) 11.5 - 15.5 %   Platelets 33 (L) 150 - 400 K/uL    Comment: REPEATED TO VERIFY PLATELET COUNT CONFIRMED BY SMEAR SPECIMEN CHECKED FOR CLOTS Immature Platelet Fraction may be clinically indicated, consider ordering this additional test [id number]    nRBC 0.1 0.0 - 0.2 %    Comment: Performed at [facility], [place] [address] [place], [place] [address]  Magnesium     Status: Abnormal   Collection Time: 05/26/19  5:33 AM  Result Value Ref Range   Magnesium 2.9 (H) 1.7 - 2.4 mg/dL    Comment: Performed at [facility] [address] [place], [place] [address]  Phosphorus     Status: Abnormal   Collection Time: 05/26/19  5:33 AM  Result Value Ref Range   Phosphorus 4.8 (H) 2.5 - 4.6 mg/dL    Comment: Performed at [facility] [address] [place], [place] [address]  Comprehensive metabolic panel     Status: Abnormal   Collection Time: 05/26/19  5:33 AM  Result Value Ref Range   Sodium 139 135 - 145 mmol/L   Potassium 3.9 3.5 - 5.1 mmol/L   Chloride 105 98 - 111 mmol/L   CO2 20 (L) 22 - 32 mmol/L   Glucose, Bld 174 (H) 70 - 99 mg/dL   BUN  145 (H) 8 - 23 mg/dL   Creatinine, Ser 3.07 (H) 0.44 - 1.00 mg/dL   Calcium 8.1 (L) 8.9 - 10.3 mg/dL   Total Protein 5.8 (L) 6.5 - 8.1 g/dL   Albumin 1.8 (L) 3.5 - 5.0 g/dL   AST 13 (L) 15 - 41 U/L   ALT 11 0 - 44 U/L   Alkaline Phosphatase 77 38 - 126 U/L   Total Bilirubin 1.2 0.3 - 1.2 mg/dL   GFR calc non Af Amer 15 (L) >60 mL/min   GFR calc Af Amer 17 (L) >60 mL/min   Anion gap 14 5 - 15    Comment: Performed at [facility], [place] [address], [place], [place] [address]  MRSA PCR Screening     Status: None   Collection Time: 05/26/19  5:41 AM   Specimen: Nasal Mucosa; Nasopharyngeal  Result Value Ref Range   MRSA by PCR NEGATIVE NEGATIVE    Comment:        The GeneXpert MRSA Assay (FDA approved for NASAL specimens only), is one component of a comprehensive MRSA colonization surveillance program. It is not intended to diagnose MRSA infection nor to guide or monitor treatment for MRSA infections. Performed at [facility], [place] [address] [place], [place] [address]   Lactic acid, plasma     Status: None   Collection Time: 05/26/19  7:25 AM  Result Value Ref Range   Lactic Acid, Venous 1.0 0.5 - 1.9 mmol/L    Comment: Performed at [facility] [address], [place], [place] [address]  HIV Antibody (routine testing w rflx)     Status: None   Collection Time: 05/26/19  7:51 AM  Result Value Ref Range   HIV Screen 4th Generation wRfx NON REACTIVE NON REACTIVE    Comment: Performed at [facility] [address], [place], [place] [address]  Lactic acid, plasma     Status: None   Collection Time: 05/26/19  7:55 AM  Result Value Ref  Range   Lactic Acid, Venous 0.9 0.5 - 1.9 mmol/L    Comment: Performed at [facility] [address], [place], [place] [address]   Ct Abdomen Pelvis Wo Contrast  Result Date: 05/25/2019 CLINICAL DATA:  Large decubitus ulcer EXAM: CT ABDOMEN AND PELVIS WITHOUT CONTRAST TECHNIQUE: Multidetector CT imaging of the  abdomen and pelvis was performed following the standard protocol without IV contrast. COMPARISON:  01/10/2019 FINDINGS: Lower chest: Lung bases demonstrate no acute consolidation or pleural effusion. Heart size upper limits of normal. Hepatobiliary: Gallstone. No focal hepatic abnormality or biliary dilatation. Pancreas: Unremarkable. No pancreatic ductal dilatation or surrounding inflammatory changes. Spleen: Normal in size without focal abnormality. Adrenals/Urinary Tract: Adrenal glands are normal. Probable cyst in the right kidney. Left perinephric edema and upper pole atrophy. No hydronephrosis. Parenchymal and intrarenal calcifications on the left. Urinary bladder contains Foley catheter. Stomach/Bowel: Stomach is within normal limits. Appendix appears normal. No evidence of bowel wall thickening, distention, or inflammatory changes. Vascular/Lymphatic: Mild aortic atherosclerosis. No aneurysmal dilatation. No significantly enlarged lymph nodes. Reproductive: Uterus and bilateral adnexa are unremarkable. Other: No free air.  Prior ventral/umbilical hernia repair Musculoskeletal: Diffuse fatty atrophy of the pelvic, gluteal and thigh musculature. Large decubitus wound/ulcer spanning from L5-S1 level to the coccyx. Wound extends to the bony surface of the sacrum. When compared with prior CT, coccygeal bones are no longer identified. No focal fluid collections to suggest abscess. IMPRESSION: 1. Large sacral decubitus wound/ulcer, extends down to the bony surface of the sacrum. When compared to the most recent prior CT, coccygeal bones are no longer visualized, presumably due to osteomyelitis. No gross focal fluid collection to suggest abscess 2. Gallstone 3. Perinephric edema and scarring/atrophy upper pole left kidney as before. Multiple stones and calcifications in the upper pole of the left kidney Electronically Signed   By: Donavan Foil M.D.   On: 05/25/2019 20:00   Ct Head Wo Contrast  Result Date:  05/25/2019 CLINICAL DATA:  73 year old female with altered mental status. Decreased p.o. Large decubitus ulcer. EXAM: CT HEAD WITHOUT CONTRAST TECHNIQUE: Contiguous axial images were obtained from the base of the skull through the vertex without intravenous contrast. COMPARISON:  Brain MRI 10/28/2017, head CT 11/20/2014. FINDINGS: Brain: Cerebral volume is only mildly decreased since 2016, within normal limits for age. Gray-white matter differentiation is within normal limits for age. No midline shift, ventriculomegaly, mass effect, evidence of mass lesion, intracranial hemorrhage or evidence of cortically based acute infarction. Vascular: Calcified atherosclerosis at the skull base. No suspicious intracranial vascular hyperdensity. Skull: No acute osseous abnormality identified. Sinuses/Orbits: Mild bubbly opacity in both sphenoid sinuses is new. Other Visualized paranasal sinuses and mastoids are stable and well pneumatized. Other: No acute orbit or scalp soft tissue finding. IMPRESSION: 1. No acute intracranial abnormality. Negative for age non contrast CT appearance of the brain. 2. Mild sphenoid sinus inflammation is new since 2019. Electronically Signed   By: Genevie Ann M.D.   On: 05/25/2019 19:47   Dg Chest Port 1 View  Result Date: 05/25/2019 CLINICAL DATA:  AMS EXAM: PORTABLE CHEST 1 VIEW COMPARISON:  Chest radiograph 01/14/2019, 01/09/2019 FINDINGS: Stable cardiomediastinal contours with enlarged heart size. Unchanged bilateral hilar enlargement consistent with enlarged pulmonary arteries. There are prominent bilateral interstitial markings without a new focal pulmonary opacity. No pneumothorax or large pleural effusion. No acute findings in the visualized skeleton. IMPRESSION: No new focal pulmonary opacity. Prominent bilateral interstitium could represent trace edema or bronchitis in the appropriate clinical setting.  Electronically Signed   By: Audie Pinto M.D.   On: 05/25/2019 18:33    Anti-infectives (From admission, onward)   Start     Dose/Rate Route Frequency Ordered Stop   05/26/19 0630  vancomycin (VANCOCIN) 1,500 mg in sodium chloride 0.9 % 500 mL IVPB     1,500 mg 250 mL/hr over 120 Minutes Intravenous  Once 05/26/19 0552 05/26/19 0958   05/26/19 0600  piperacillin-tazobactam (ZOSYN) IVPB 3.375 g     3.375 g 12.5 mL/hr over 240 Minutes Intravenous Every 8 hours 05/26/19 0552     05/26/19 0553  vancomycin variable dose per unstable renal function (pharmacist dosing)      Does not apply See admin instructions 05/26/19 0553          Assessment/Plan COPD on 4L [place] at baseline OSA Diastolic heart failure (EF 40-45%) HTN HLD PE on Eliquis COVID+  Unstageable sacral wound with probable underlying osteomyelitis - Unfortunately I do not feel that surgical intervention would help to heal this wound. I do not see any purulent drainage and CT scan shows no drainable abscesses.  Patient would also be very high risk to put to sleep in the operating room due to her MMP and she is covid positive.  Recommend palliative care consult for goals of care discussions.  If family desires full scope of care could consider hydrotherapy. For now I would recommend daily wet to dry dressing changes, add santyl to necrotic areas.   ID - vancomycin/zosyn 9/29>> VTE - SCDs FEN - IVF, NPO Foley - in place Follow up - TBD  [place], [facility] Surgery 05/26/2019, 1:26 PM Pager: [phone] Mon-Thurs 7:00 am-4:30 pm Fri 7:00 am -11:30 AM Sat-Sun 7:00 am-11:30 am

## 2019-05-26 NOTE — Progress Notes (Addendum)
S: Patient arrived to [facility]M around 5 am. Remains on Levo for pressure support. Oxygenating well on 2L [place]. Continues to be encephalopathic and moans with any manipulation on exam. Does not answer questions.   O:  General: awake, disoriented, moaning in bed HEENT: dry mucous membranes Pulm: normal work of breathing; decrease breath sounds but overall clear  CV: RRR Abd: Soft, non-distended Ext: Chronic lymphedema in BLE GU: foley in place  A/P Septic shock - possible sources include urinary vs sacral decub with osteo  - continue vanc/zosyn until further cx data - titrate Levo for MAP > 65 - still volume down; continue IVF at 125 cc/hr  - trend fever curve, WBC count  - consult general surgery for sacral wound per WOC recommendations   AG Metabolic acidosis Acte on chronic renal failure - renal function improving with fluids - continue to trend BMPs  - replace lytes PRN   Acute encephalopathy - likely in the setting of septic shock and hyperuremia  - continue management as above - change pain control to fentanyl as needed - minimize centrally acting medications  GOC - will need to have discussion with family regarding [place] and overall health trajectory  - may get palliative care involved after initial discussions regarding her care have taken place    Delice Bison, DO PGY-2 05/25/28, 11:20 am

## 2019-05-26 NOTE — ED Notes (Addendum)
Report given to [facility] for [phone]

## 2019-05-26 NOTE — ED Notes (Signed)
Report given to [facility]. 

## 2019-05-26 NOTE — Progress Notes (Signed)
Spoke with Lynnann's sister, Kennyth Lose, regarding patient's condition and answered her questions. We discussed her overall poor health trajectory and prognosis.  Kennyth Lose was able to call their brother to discuss patient's wishes and code status and called me back. I explained that even if we were able to get her heart to start beating again with CPR, she would likely not do well long-term. Based on previous discussions with patient, they said she would not want to be revived if her heart were to stop and would want to pass in peace. I reiterated that this means she will be changed to a DNR which sister acknowledged and is in agreement with.   [place], [place], DO PGY-2 05/26/19, 3:47 pm

## 2019-05-26 NOTE — Procedures (Signed)
Arterial Catheter Insertion Procedure Note Janice Morrow [id number] 1946/03/08  Procedure: Insertion of Arterial Catheter  Indications: Blood pressure monitoring and Frequent blood sampling  Procedure Details Consent: Unable to obtain consent because of altered level of consciousness. Time Out: Verified patient identification, verified procedure, site/side was marked, verified correct patient position, special equipment/implants available, medications/allergies/relevent history reviewed, required imaging and test results available.  Performed  Maximum sterile technique was used including antiseptics, cap, gloves, gown, hand hygiene, mask and sheet. Skin prep: Chlorhexidine; local anesthetic administered 20 gauge catheter was inserted into left radial artery using the Seldinger technique. ULTRASOUND GUIDANCE USED: NO Evaluation Blood flow good; BP tracing good. Complications: No apparent complications.   Virgilio Frees 05/26/2019

## 2019-05-27 ENCOUNTER — Encounter (HOSPITAL_COMMUNITY): Payer: Self-pay | Admitting: Pulmonary Disease

## 2019-05-27 DIAGNOSIS — U071 COVID-19: Secondary | ICD-10-CM

## 2019-05-27 DIAGNOSIS — L89154 Pressure ulcer of sacral region, stage 4: Secondary | ICD-10-CM

## 2019-05-27 HISTORY — DX: Pressure ulcer of sacral region, stage 4: L89.154

## 2019-05-27 LAB — BASIC METABOLIC PANEL
Anion gap: 16 — ABNORMAL HIGH (ref 5–15)
BUN: 97 mg/dL — ABNORMAL HIGH (ref 8–23)
CO2: 20 mmol/L — ABNORMAL LOW (ref 22–32)
Calcium: 8 mg/dL — ABNORMAL LOW (ref 8.9–10.3)
Chloride: 110 mmol/L (ref 98–111)
Creatinine, Ser: 1.92 mg/dL — ABNORMAL HIGH (ref 0.44–1.00)
GFR calc Af Amer: 30 mL/min — ABNORMAL LOW (ref >60)
GFR calc non Af Amer: 26 mL/min — ABNORMAL LOW (ref >60)
Glucose, Bld: 196 mg/dL — ABNORMAL HIGH (ref 70–99)
Potassium: 3.1 mmol/L — ABNORMAL LOW (ref 3.5–5.1)
Sodium: 146 mmol/L — ABNORMAL HIGH (ref 135–145)

## 2019-05-27 LAB — MAGNESIUM: Magnesium: 2.4 mg/dL (ref 1.7–2.4)

## 2019-05-27 LAB — CBC
HCT: 28.1 % — ABNORMAL LOW (ref 36.0–46.0)
Hemoglobin: 8.2 g/dL — ABNORMAL LOW (ref 12.0–15.0)
MCH: 27.2 pg (ref 26.0–34.0)
MCHC: 29.2 g/dL — ABNORMAL LOW (ref 30.0–36.0)
MCV: 93.4 fL (ref 80.0–100.0)
Platelets: 25 10*3/uL — CL (ref 150–400)
RBC: 3.01 MIL/uL — ABNORMAL LOW (ref 3.87–5.11)
RDW: 16.8 % — ABNORMAL HIGH (ref 11.5–15.5)
WBC: 11.4 10*3/uL — ABNORMAL HIGH (ref 4.0–10.5)
nRBC: 0 % (ref 0.0–0.2)

## 2019-05-27 LAB — PHOSPHORUS: Phosphorus: 3.4 mg/dL (ref 2.5–4.6)

## 2019-05-27 LAB — GLUCOSE, CAPILLARY: Glucose-Capillary: 189 mg/dL — ABNORMAL HIGH (ref 70–99)

## 2019-05-27 LAB — VANCOMYCIN, RANDOM: Vancomycin Rm: 20

## 2019-05-27 MED ORDER — VANCOMYCIN HCL 10 G IV SOLR
1750.0000 mg | Freq: Once | INTRAVENOUS | Status: AC
  Administered 2019-05-27: 1750 mg via INTRAVENOUS
  Filled 2019-05-27: qty 1750

## 2019-05-27 MED ORDER — LACTATED RINGERS IV SOLN
INTRAVENOUS | Status: DC
  Administered 2019-05-28: 04:00:00 via INTRAVENOUS

## 2019-05-27 NOTE — H&P (Signed)
NAME:  Janice Morrow, MRN:  [id number], DOB:  [birth year], LOS: 1 ADMISSION DATE:  05/26/2019, CONSULTATION DATE:  05/26/2019 REFERRING MD:  Dr. Joan Mayans, CHIEF COMPLAINT:  Encephalopathy, Septic Shock    History of present illness   73 year old female presents from SNF with AMS and hypotension. Per report patient appears dehydrated and has not been eating or drinking, along with pocketing medications. On arrival to ED patient is moaning, alert, but unable to provide history. On exam has a large, deep, unstageable sacral decubitus ulcer with possible bony exposure concerning for osteomyelitis. PAN Cultured. U/A concerning for UTI. Given Rocephin/Flagyl/Vancomycin. CT Head Negative for acute. Hypotension despite 5L [place]. Started on Levophed/Vasopressin. LA 2.1. WBC 12. Crt 3.69. BUN 169. CTA A/P concerning for Osteomyelitis. While in ED had a witnessed episode of rhythmic bilateral arm twitching, and eyebrow twitching. Given Ativan with good response. Tested positive for COVID. Due to acute seizure accepted to Critical Care Service at [facility].   Of note admission May 2020 patient admitted with E.Coli UTI. Case management involved as patient lives at hotel with brother. Discharged back to hotel with home health care as patient refused SNF. Days later Brother called back days later and reported that he could not care for her. Patient was sent to [facility] for rehab. Transferred to [facility] for SNF 02/2019.    Past Medical History  May 2020 with E.Coli UTI secondary Pyelonephritis, OSA, COPD on 4L [place], Prior PE on Eliquis, HTN, HLD, CKD, Grade 1 Diastolic HF with EF [id number]    [facility] Events   9/28 > Presents to ED   Consults:  PCCM CCS  Procedures:  Left Femoral CVC (Placed OSH) 9/28 >>   Significant Diagnostic Tests:  CT Head 9/28 > 1. No acute intracranial abnormality. Negative for age non contrast CT appearance of the brain. 2. Mild sphenoid sinus inflammation  is new since 2019. CT A/P 9/28 > 1. Large sacral decubitus wound/ulcer, extends down to the bony surface of the sacrum. When compared to the most recent prior CT, coccygeal bones are no longer visualized, presumably due to osteomyelitis. No gross focal fluid collection to suggest abscess 2. Gallstone 3. Perinephric edema and scarring/atrophy upper pole left kidney as before. Multiple stones and calcifications in the upper pole of the left kidney CXR 9/28 > Stable cardiomediastinal contours with enlarged heart size. Unchanged bilateral hilar enlargement consistent with enlarged pulmonary arteries. There are prominent bilateral interstitial markings without a new focal pulmonary opacity. No pneumothorax or large pleural effusion. No acute findings in the visualized skeleton.  Micro Data:  Blood 9/28 >> Urine 9/28 >> U/A 9/28 > Turbid, Brown, Many Bacteria, Small Leukocytes, Negative Nitrites   Antimicrobials:  Cefepime 9/28  Flagyl 9/28  Vancomycin 9/28    Interim history/subjective:  Patient's condition discussed with her sister, who agreed to DNR status.  S/B CCS yesterday - no operative options. Hydrotherapy would be most that could be offered.   Objective   Blood pressure (!) 141/65, pulse 79, temperature 98.5 F (36.9 C), temperature source Axillary, resp. rate 13, weight (!) 160 kg, last menstrual period 08/27/1993, SpO2 97 %.        Intake/Output Summary (Last 24 hours) at 05/27/2019 0931 Last data filed at 05/27/2019 0900 Gross per 24 hour  Intake 4586.95 ml  Output 4245 ml  Net 341.95 ml   Filed Weights   05/27/19 0445  Weight: (!) 160 kg    Examination: General: Obese adult female,  lying in bed, moaning with minimal stimulation. HENT: Dry MM  Lungs: clear, no wheeze/crackles  Cardiovascular: RRR, no MRG Abdomen: soft. Extremities: Chronic Lymphedema Bilateral LE >> painful to touch  Neuro: Alert, confused, incomprehensible speech  GU: Foley in place   Wound: Noted large [facility] Problem list    Assessment & Plan:    Critically ill due to hypotension in setting of Hypovolemia and Septic Shock Diastolic Heart Failure (EF 40-45)  H/O HTN, HLD, Pulmonary Embolism on Eliquis  -Currently on Levophed/Vasopressin, Titrate for MAP goal >65 -Hold Coreg and lasix at this time due to AKI and hypotension   Chronic Hypoxic/Hypercarbic Respiratory Failure in setting of COPD (on 4L [place] at baseline) and OSA (reported back in May 2020 not wearing CPAP) COVID +  -Titrate Supplemental Oxygen to Maintain Saturation >88 -Scheduled and PRN Nebs -Pulmonary Hygiene   Improving AKI - Decrease IV fluids.  Infected large decubitus ulcer Uncurable. Significant pain - Continue current antibiotics and wound care - Goals of care discussion with family as ulcer is uncurable and patient's has very poor quality of life. - Marginal BP is limiting ability to give sufficient analgesia  - Mental status is limiting oral intake and patient would require nasogastric feeding tube.  Best practice:  Diet: NPO DVT prophylaxis: Heparin SQ  GI prophylaxis: PPI Glucose control: Trend Glucose  Mobility: Bedrest  Code Status: DNR Family Communication: sister updated by Dr Koleen Distance: patient is now DNR Disposition: ICU, poor prognosis, Palliative care to see  Labs   CBC: Recent Labs  Lab 05/25/19 1747 05/26/19 0533 05/27/19 0513  WBC 12.0* 14.2* 11.4*  NEUTROABS 10.0*  --   --   HGB 12.0 10.8* 8.2*  HCT 38.9 34.3* 28.1*  MCV 88.6 91.7 93.4  PLT 86* 33* 25*    Basic Metabolic Panel: Recent Labs  Lab 05/25/19 1747 05/26/19 0533 05/27/19 0513  NA 136 139 146*  K 4.9 3.9 3.1*  CL 94* 105 110  CO2 26 20* 20*  GLUCOSE 133* 174* 196*  BUN 169* 145* 97*  CREATININE 3.69* 3.07* 1.92*  CALCIUM 9.6 8.1* 8.0*  MG  --  2.9* 2.4  PHOS  --  4.8* 3.4   GFR: Estimated Creatinine Clearance: 41.1 mL/min (A) (by C-G formula based on SCr  of 1.92 mg/dL (H)). Recent Labs  Lab 05/25/19 1747 05/25/19 1831 05/26/19 0533 05/26/19 0725 05/26/19 0755 05/27/19 0513  WBC 12.0*  --  14.2*  --   --  11.4*  LATICACIDVEN  --  2.1*  --  1.0 0.9  --     Liver Function Tests: Recent Labs  Lab 05/25/19 1747 05/26/19 0533  AST 19 13*  ALT 12 11  ALKPHOS 83 77  BILITOT 1.5* 1.2  PROT 6.8 5.8*  ALBUMIN 2.5* 1.8*   No results for input(s): LIPASE, AMYLASE in the last 168 hours. No results for input(s): AMMONIA in the last 168 hours.  ABG    Component Value Date/Time   PHART 7.35 05/25/2019 2338   PCO2ART 44 05/25/2019 2338   PO2ART 84 05/25/2019 2338   HCO3 24.8 05/25/2019 2338   TCO2 30 09/30/2017 2312   ACIDBASEDEF 1.5 05/25/2019 2338   O2SAT 96.5 05/25/2019 2338     Coagulation Profile: Recent Labs  Lab 05/25/19 1747  INR 1.5*    Cardiac Enzymes: Recent Labs  Lab 05/25/19 1747  CKTOTAL 78    HbA1C: Hemoglobin A1C  Date/Time Value Ref Range Status  08/12/2018 02:09  PM 6.1 (A) 4.0 - 5.6 % Final  01/28/2018 02:23 PM 5.8 (A) 4.0 - 5.6 % Final   Hgb A1c MFr Bld  Date/Time Value Ref Range Status  05/20/2017 05:05 PM 6.1 4.6 - 6.5 % Final    Comment:    Glycemic Control Guidelines for People with Diabetes:Non Diabetic:  <6%Goal of Therapy: <7%Additional Action Suggested:  >8%   01/30/2017 04:29 PM 6.5 4.6 - 6.5 % Final    Comment:    Glycemic Control Guidelines for People with Diabetes:Non Diabetic:  <6%Goal of Therapy: <7%Additional Action Suggested:  >8%     CBG: Recent Labs  Lab 05/26/19 0350 05/26/19 2052 05/26/19 2342 05/27/19 0344  GLUCAP 156* 182* 187* 189*   CRITICAL CARE Performed by: Kipp Brood   Total critical care time: 35 minutes  Critical care time was exclusive of separately billable procedures and treating other patients.  Critical care was necessary to treat or prevent imminent or life-threatening deterioration.  Critical care was time spent personally by me on the  following activities: development of treatment plan with patient and/or surrogate as well as nursing, discussions with consultants, evaluation of patient's response to treatment, examination of patient, obtaining history from patient or surrogate, ordering and performing treatments and interventions, ordering and review of laboratory studies, ordering and review of radiographic studies, pulse oximetry, re-evaluation of patient's condition and participation in multidisciplinary rounds.  Kipp Brood, MD [facility] ICU Physician [place]  Pager: [phone] Mobile: [phone] After hours: [phone].

## 2019-05-27 NOTE — Progress Notes (Signed)
CRITICAL VALUE ALERT  Critical Value: Platelets 25  Date & Time Notied: 05/27/2019 @ 0608  Provider Notified: Karene Fry, RN  Orders Received/Actions taken: See orders

## 2019-05-27 NOTE — Progress Notes (Addendum)
[place] Progress Note Patient Name: ANJULIE PEELER DOB: Oct 14, 1945 MRN: [id number]   Date of Service  05/27/2019  HPI/Events of Note  Progressive thrombocytosis of unclear etiology.  eICU Interventions  Hematology consultation requested, ? Discontinue Zosyn        Frederik Pear 05/27/2019, 6:23 AM

## 2019-05-27 NOTE — Progress Notes (Signed)
Did not d/c arterial  line patient cuff b/p on lower forearm. Patient remains on levophed.

## 2019-05-27 NOTE — Progress Notes (Signed)
Dressing changed patient crying out entire time unable to measure wound. Wound on left buttock black in color and wound on right buttock bleeding wounds packed as per order.

## 2019-05-27 NOTE — Progress Notes (Signed)
Pharmacy Antibiotic Note  Janice Morrow is a 73 y.o. female admitted on 05/26/2019 with osteomyelitis from decubitus ulcer/UTI. Pt has hx of E. Coli pyelonephritis in May 2020. Pharmacy has been consulted for vancomycin/Zosyn dosing.  Vanc random level this AM was 20 mcg/ml after receiving 1 gm IV on 9/28 and 1500 mg IV on 9/29. AKI has improved, Scr 3.69>>3.07>>1.92, BL ~1.0. UOP improved to 1.2 ml/kg/hr. Pt remains afebrile, WBC trending down to 11.4.   Plan: No standing vanc order due to variable renal fxn Vancomycin 1750 mg IV once this AM If SCr continues to trend down tomorrow, can go to AUC dosing. If worsens, continue to dose by level. Continue Zosyn 3.375 gm IV q8h F/U cxs, abx de-escalation   Weight: (!) 352 lb 11.8 oz (160 kg)  Temp (24hrs), Avg:98.5 F (36.9 C), Min:98.4 F (36.9 C), Max:98.6 F (37 C)  Recent Labs  Lab 05/25/19 1747 05/25/19 1831 05/26/19 0533 05/26/19 0725 05/26/19 0755 05/27/19 0513  WBC 12.0*  --  14.2*  --   --  11.4*  CREATININE 3.69*  --  3.07*  --   --  1.92*  LATICACIDVEN  --  2.1*  --  1.0 0.9  --   VANCORANDOM  --   --   --   --   --  20    Estimated Creatinine Clearance: 41.1 mL/min (A) (by C-G formula based on SCr of 1.92 mg/dL (H)).    Allergies  Allergen Reactions  . Baclofen Other (See Comments)    Causes seizures  . Imdur [Isosorbide Dinitrate] Other (See Comments)    Severe persistent headache  . Naproxen Palpitations    Antimicrobials this admission: 9/29 Vanc >> 9/29 Zosyn >> 9/29 Cefepime x1 9/29 Flagyl x1  Microbiology results: 9/28 COVID positive 9/28 BCx: GPR in 1/2 bottles>> 9/29 MRSA PCR negative  Thank you for allowing pharmacy to be a part of this patient's care.  Berenice Bouton, PharmD PGY1 Pharmacy Resident Office phone: [phone] 05/27/2019 8:43 AM

## 2019-05-27 NOTE — Progress Notes (Signed)
Palliative:  Consult received and chart reviewed. Spoke with RN regarding patient's status. Attempted to call sister - no answer, left voicemail with call back number. Will continue to make attempts to discuss [place] with sister.  Thank you for involving the palliative medicine team in this patient's care.  Juel Burrow, DNP, AGNP-C Palliative Medicine Team Team Phone # [phone]  Pager # [phone]  NO CHARGE

## 2019-05-27 NOTE — Progress Notes (Signed)
Inpatient Diabetes Program Recommendations  AACE/ADA: New Consensus Statement on Inpatient Glycemic Control (2015)  Target Ranges:  Prepandial:   less than 140 mg/dL      Peak postprandial:   less than 180 mg/dL (1-2 hours)      Critically ill patients:  140 - 180 mg/dL   Lab Results  Component Value Date   GLUCAP 189 (H) 05/27/2019   HGBA1C 6.1 (A) 08/12/2018    Review of Glycemic Control Results for SOFIYA, ASHBRIDGE (MRN [id number]) as of 05/27/2019 10:05  Ref. Range 05/26/2019 03:50 05/26/2019 20:52 05/26/2019 23:42 05/27/2019 03:44  Glucose-Capillary Latest Ref Range: 70 - 99 mg/dL 156 (H) 182 (H) 187 (H) 189 (H)   Diabetes history: DM 2 Outpatient Diabetes medications: None Current orders for Inpatient glycemic control: None  Inpatient Diabetes Program Recommendations:    Glucose tends trending slightly above 180 mg/dl.  May consider Custom Novolog Correction scale starting at 150 mg/dl Q6 hours due to elevated renal function.  Thanks,  Tama Headings RN, MSN, BC-ADM Inpatient Diabetes Coordinator Team Pager [phone] (8a-5p)

## 2019-05-28 ENCOUNTER — Encounter (HOSPITAL_COMMUNITY): Payer: Self-pay

## 2019-05-28 ENCOUNTER — Other Ambulatory Visit: Payer: Self-pay

## 2019-05-28 DIAGNOSIS — R579 Shock, unspecified: Secondary | ICD-10-CM

## 2019-05-28 DIAGNOSIS — Z515 Encounter for palliative care: Secondary | ICD-10-CM

## 2019-05-28 DIAGNOSIS — L89154 Pressure ulcer of sacral region, stage 4: Secondary | ICD-10-CM

## 2019-05-28 LAB — URINE CULTURE: Culture: >=100000 — AB

## 2019-05-28 LAB — CULTURE, BLOOD (ROUTINE X 2): Special Requests: ADEQUATE

## 2019-05-28 MED ORDER — GLYCOPYRROLATE 0.2 MG/ML IJ SOLN: 0.2000 mg | INTRAMUSCULAR | Status: DC | PRN

## 2019-05-28 MED ORDER — HALOPERIDOL LACTATE 5 MG/ML IJ SOLN: 0.5000 mg | INTRAMUSCULAR | Status: DC | PRN

## 2019-05-28 MED ORDER — GLYCOPYRROLATE 1 MG PO TABS
1.0000 mg | ORAL_TABLET | ORAL | Status: DC | PRN
  Filled 2019-05-28: qty 1

## 2019-05-28 MED ORDER — HALOPERIDOL 0.5 MG PO TABS
0.5000 mg | ORAL_TABLET | ORAL | Status: DC | PRN
  Filled 2019-05-28: qty 1

## 2019-05-28 MED ORDER — MORPHINE 100MG IN NS 100ML (1MG/ML) PREMIX INFUSION
1.0000 mg/h | INTRAVENOUS | Status: DC
  Administered 2019-05-28 – 2019-05-30 (×5): 10 mg/h via INTRAVENOUS
  Filled 2019-05-28 (×7): qty 100

## 2019-05-28 MED ORDER — HALOPERIDOL LACTATE 2 MG/ML PO CONC
0.5000 mg | ORAL | Status: DC | PRN
  Filled 2019-05-28: qty 0.3

## 2019-05-28 MED ORDER — MORPHINE BOLUS VIA INFUSION
4.0000 mg | INTRAVENOUS | Status: DC | PRN
  Administered 2019-05-29: 4 mg via INTRAVENOUS
  Filled 2019-05-28: qty 4

## 2019-05-28 NOTE — Progress Notes (Signed)
CSW spoke with Darol Destine, NP of palliative care who requested that CSW become involved to assist with possible sending this patient to the [facility] versus [facility]. CSW spoke with Benancio Deeds at [facility] who agreed to review this patient's information in [place]. CSW provided Shiree with patient's sister and [facility] unit contact information for follow up.   Madilyn Fireman, MSW, LCSW-A Clinical Social Worker   Transitions of [facility]   Medical ICU [phone]

## 2019-05-28 NOTE — Progress Notes (Signed)
Patient moaning continuously, starts screaming when touched for any reason. Frightened look on patient's face.  Urine increasingly blood colored, continuing to have blood from vagina. No apparent improvement in comfort with ordered 0.5 dilaudid every 3 hours.   Milford Cage, RN

## 2019-05-28 NOTE — Progress Notes (Signed)
Palliative:  Attempted yesterday to reach sister by phone with no answer. She returned my call this morning; however, no answer when I called her back. Have made multiple attempts to reach her with no answer - voicemail left. Noted that patient has been transitioned to comfort care by CCM and is being transferred to [facility]. PMT will be available for needs - please call.   Juel Burrow, DNP, AGNP-C Palliative Medicine Team Team Phone # [phone]  Pager # [phone]  NO CHARGE

## 2019-05-28 NOTE — Consult Note (Signed)
Consultation Note Date: 05/28/2019   Patient Name: Janice Morrow  DOB: Nov 16, 1945  MRN: [id number]  Age / Sex: 73 y.o., female  PCP: Janice Lima, MD Referring Physician: Kipp Brood, MD  Reason for Consultation: Establishing goals of care  HPI/Patient Profile: 73 y.o. female  with past medical history of HTN, HLD, COPD, PE, dHF, CKD, and obesity admitted on 05/26/2019 with AMS and no PO intake. Found to have unstagable pressure ulcer with possible osteomyelitis. UA concerning for UTI. Required pressors d/t hypotension. Found to be COVID positive.  PMT consulted for Janice Morrow.   Clinical Assessment and Goals of Care: I have reviewed medical records including EPIC notes, and labs and imaging.   After many attempts to make contact I was able to speak with patient's sister, Janice Morrow, to discuss diagnosis prognosis, [place], EOL wishes, disposition and options.  I introduced Palliative Medicine as specialized medical care for people living with serious illness. It focuses on providing relief from the symptoms and stress of a serious illness. The goal is to improve quality of life for both the patient and the family.  Janice Morrow shares that no one has seen the patient since May because she was placed in a LTC facility and visitation was restricted d/t COVID. She is unsure how she has been doing but does share that the patient has been nonambulatory since placement d/t problems with her back. She believes patient has rapidly declined over the last month d/t her mother passing away in mid- August.    We discussed her current illness and what it means in the larger context of her on-going co-morbidities.  Natural disease trajectory and expectations at EOL were discussed. Janice Morrow shares her conversation with critical care team this morning and decision for full comfort care. She continues to share that her only wish is the patient not suffer.   We discussed  care moving forward - Janice Morrow is very sad that more family members cannot come to the hospital to visit. We discussed an alternative of transferring the patient to a hospice facility where additional visitor are allowed. Janice Morrow is interested in this and requests we make a referral to [facility]. I have discussed case with hospice liaison.   Questions and concerns were addressed. The family was encouraged to call with questions or concerns.   Primary Decision Maker NEXT OF KIN - siblings    SUMMARY OF RECOMMENDATIONS   Comfort care orders placed by CCM - have added PRN boluses of morphine infusion and PRN haldol Discussed transfer to hospice facility with hospice liaison and social work - not sure when this can be arranged - may be its possible to hold off on [place] transfer  Code Status/Advance Care Planning:  DNR  Additional Recommendations (Limitations, Scope, Preferences):  Full Comfort Care  Psycho-social/Spiritual:   Desire for further Chaplaincy support:no  Additional Recommendations: Education on Hospice  Prognosis:   Hours - Days  Discharge Planning: Hospice facility - [facility]      Primary Diagnoses: Present on Admission: . Shock ([place])   I have reviewed the medical record, interviewed the patient and family, and examined the patient. The following aspects are pertinent.  Past Medical History:  Diagnosis Date  . Acute diastolic CHF (congestive heart failure) ([place]) 08/24/2015  . Cervicalgia   . Chronic back pain   . COPD (chronic obstructive pulmonary disease) (HCC)    on 4L home O2  . Essential hypertension 08/24/2015  . GERD (gastroesophageal reflux disease)   . Headache(784.0)   .  Heart murmur   . History of kidney stones   . Hyperlipidemia   . Migraine without aura, with intractable migraine, so stated, without mention of status migrainosus   . OSA on CPAP    she no longer wears CPAP  . Osteoarthritis   . Pulmonary embolism ([place])   . PVD  (peripheral vascular disease) ([place])   . Seizures ([place]) 07/2014, 10/2014   due to baclofen  . Shortness of breath dyspnea   . Stage IV pressure ulcer of sacral region ([place]) 05/27/2019  . Trigeminal neuralgia    Social History   Socioeconomic History  . Marital status: Single    Spouse name: Not on file  . Number of children: 1  . Years of education: 12th  . Highest education level: Not on file  Occupational History  . Occupation: DISABLED    Employer: UNEMPLOYED  Social Needs  . Financial resource strain: Not very hard  . Food insecurity    Worry: Never true    Inability: Never true  . Transportation needs    Medical: No    Non-medical: No  Tobacco Use  . Smoking status: Former Smoker    Packs/day: 0.30    Years: 30.00    Pack years: 9.00    Types: Cigarettes    Quit date: 08/22/2010    Years since quitting: 8.7  . Smokeless tobacco: Never Used  Substance and Sexual Activity  . Alcohol use: No    Alcohol/week: 0.0 standard drinks  . Drug use: No  . Sexual activity: Never  Lifestyle  . Physical activity    Days per week: 0 days    Minutes per session: 0 min  . Stress: Only a little  Relationships  . Social connections    Talks on phone: More than three times a week    Gets together: More than three times a week    Attends religious service: More than 4 times per year    Active member of club or organization: No    Attends meetings of clubs or organizations: Never    Relationship status: Patient refused  Other Topics Concern  . Not on file  Social History Narrative   Patient lives at home with her brother.    Disabled.   Education high school.   Right handed.   Caffeine Use: 5-6 20oz bottle sodas daily   Family History  Problem Relation Age of Onset  . Heart attack Father   . Diabetes Mother   . Dementia Mother   . Diabetes Brother   . Heart disease Other   . Diabetes Other    Scheduled Meds: . Chlorhexidine Gluconate Cloth  6 each Topical Daily  .  collagenase  1 application Topical Daily  . mouth rinse  15 mL Mouth Rinse BID   Continuous Infusions: . morphine 10 mg/hr (05/28/19 0923)   PRN Meds:.ipratropium-albuterol, LORazepam Allergies  Allergen Reactions  . Baclofen Other (See Comments)    Causes seizures  . Imdur [Isosorbide Dinitrate] Other (See Comments)    Severe persistent headache  . Naproxen Palpitations    Vital Signs: BP 102/62   Pulse 92   Temp 99.6 F (37.6 C) (Axillary)   Resp 14   Wt (!) 170 kg   LMP 08/27/1993 (Approximate)   SpO2 96%   BMI 66.39 kg/m  Pain Scale: CPOT   Pain Score: 5    SpO2: SpO2: 96 % O2 Device:SpO2: 96 % O2 Flow Rate: .O2 Flow Rate (L/min): 2 L/min  IO: Intake/output summary:   Intake/Output Summary (Last 24 hours) at 05/28/2019 1358 Last data filed at 05/28/2019 0900 Gross per 24 hour  Intake 1295.05 ml  Output 2085 ml  Net -789.95 ml    LBM: Last BM Date: 05/27/19 Baseline Weight: Weight: (!) 160 kg Most recent weight: Weight: (!) 170 kg     Palliative Assessment/Data: PPS 10%    The above conversation was completed via telephone due to the visitor restrictions during the COVID-19 pandemic. Thorough chart review and discussion with necessary members of the care team was completed as part of assessment. All issues were discussed and addressed but no physical exam was performed.  Time Total: 50 minutes Greater than 50%  of this time was spent counseling and coordinating care related to the above assessment and plan.  Juel Burrow, DNP, AGNP-C Palliative Medicine Team [phone] Pager: [phone]

## 2019-05-28 NOTE — Progress Notes (Signed)
[facility]:  Received referral from Madilyn Fireman for Hospice services at [facility] [place] due to COVID + diagnosis.   Multiple attempts to speak to the pt's sister Kennyth Lose with no return call to discuss hospice services and goals.  Webb Silversmith RN [phone]

## 2019-05-28 NOTE — Progress Notes (Signed)
NAME:  ALEC DIPERNA, MRN:  [id number], DOB:  1945-11-05, LOS: 2 ADMISSION DATE:  05/26/2019, CONSULTATION DATE:  05/26/2019 REFERRING MD:  Dr. Joan Mayans, CHIEF COMPLAINT:  Encephalopathy, Septic Shock    History of present illness   73 year old female presents from SNF with AMS and hypotension. Per report patient appears dehydrated and has not been eating or drinking, along with pocketing medications. On arrival to ED patient is moaning, alert, but unable to provide history. On exam has a large, deep, unstageable sacral decubitus ulcer with possible bony exposure concerning for osteomyelitis. PAN Cultured. U/A concerning for UTI. Given Rocephin/Flagyl/Vancomycin. CT Head Negative for acute. Hypotension despite 5L [place]. Started on Levophed/Vasopressin. LA 2.1. WBC 12. Crt 3.69. BUN 169. CTA A/P concerning for Osteomyelitis. While in ED had a witnessed episode of rhythmic bilateral arm twitching, and eyebrow twitching. Given Ativan with good response. Tested positive for COVID. Due to acute seizure accepted to Critical Care Service at [facility].   Of note admission May 2020 patient admitted with E.Coli UTI. Case management involved as patient lives at hotel with brother. Discharged back to hotel with home health care as patient refused SNF. Days later Brother called back days later and reported that he could not care for her. Patient was sent to [facility] for rehab. Transferred to [facility] for SNF 02/2019.    Past Medical History  May 2020 with E.Coli UTI secondary Pyelonephritis, OSA, COPD on 4L [place], Prior PE on Eliquis, HTN, HLD, CKD, Grade 1 Diastolic HF with EF [id number]    [facility] Events   9/28 > Presents to ED   Consults:  PCCM CCS  Procedures:  Left Femoral CVC (Placed OSH) 9/28 >>   Significant Diagnostic Tests:  CT Head 9/28 > 1. No acute intracranial abnormality. Negative for age non contrast CT appearance of the brain. 2. Mild sphenoid sinus inflammation  is new since 2019. CT A/P 9/28 > 1. Large sacral decubitus wound/ulcer, extends down to the bony surface of the sacrum. When compared to the most recent prior CT, coccygeal bones are no longer visualized, presumably due to osteomyelitis. No gross focal fluid collection to suggest abscess 2. Gallstone 3. Perinephric edema and scarring/atrophy upper pole left kidney as before. Multiple stones and calcifications in the upper pole of the left kidney CXR 9/28 > Stable cardiomediastinal contours with enlarged heart size. Unchanged bilateral hilar enlargement consistent with enlarged pulmonary arteries. There are prominent bilateral interstitial markings without a new focal pulmonary opacity. No pneumothorax or large pleural effusion. No acute findings in the visualized skeleton.  Micro Data:  Blood 9/28 >> Urine 9/28 >> U/A 9/28 > Turbid, Brown, Many Bacteria, Small Leukocytes, Negative Nitrites   Antimicrobials:  Cefepime 9/28  Flagyl 9/28  Vancomycin 9/28    Interim history/subjective:  Patient's condition discussed with her sister, who agreed to DNR status.  S/B CCS yesterday - no operative options. Hydrotherapy would be most that could be offered.  Patient continues to only moan loudly in pain.  Objective   Blood pressure 102/62, pulse 92, temperature 99.6 F (37.6 C), temperature source Axillary, resp. rate 14, weight (!) 170 kg, last menstrual period 08/27/1993, SpO2 96 %.        Intake/Output Summary (Last 24 hours) at 05/28/2019 0839 Last data filed at 05/28/2019 0700 Gross per 24 hour  Intake 1869.75 ml  Output 2985 ml  Net -1115.25 ml   Filed Weights   05/27/19 0445 05/28/19 0323  Weight: (!) 160  kg (!) 170 kg    Examination: General: Obese adult female, lying in bed, moaning with minimal stimulation. HENT: Dry MM  Lungs: clear, no wheeze/crackles  Cardiovascular: RRR, no MRG Abdomen: soft. Extremities: Chronic Lymphedema Bilateral LE >> painful to touch   Neuro: Alert, confused, incomprehensible speech  GU: Foley in place  Wound: Noted large [facility] Problem list    Assessment & Plan:    Was Critically ill due to hypotension in setting of Hypovolemia and Septic Shock - now off vasopressors. Diastolic Heart Failure (EF 40-45)  H/O HTN, HLD, Pulmonary Embolism on Eliquis  -Hold Coreg and lasix at this time due to AKI and hypotension   Chronic Hypoxic/Hypercarbic Respiratory Failure in setting of COPD (on 4L [place] at baseline) and OSA (reported back in May 2020 not wearing CPAP) COVID +  -Titrate Supplemental Oxygen to Maintain Saturation >88 -Scheduled and PRN Nebs -Pulmonary Hygiene   Improving AKI - Decrease IV fluids.  Infected large decubitus ulcer Uncurable. Significant pain - Continue current antibiotics and wound care - Goals of care discussion with family as ulcer is uncurable and patient's has very poor quality of life. - Marginal BP is limiting ability to give sufficient analgesia  - Mental status is limiting oral intake and patient would require nasogastric feeding tube.  Best practice:  Diet: NPO DVT prophylaxis: Heparin SQ  GI prophylaxis: PPI Glucose control: Trend Glucose  Mobility: Bedrest  Code Status: DNR Family Communication: I spoke with sister and explained that sacral ulcer is large and uncurable.  She is extremely debilitated and will eventually succumb to infections related to her decubitus ulcer.  Her sister acknowledged her decline and agreed that she has suffered enough.  We have agreed to a transition to comfort care. Disposition: ICU, poor prognosis, given that she is COVID positive she will be transferred to the [facility] for ongoing hospice care.  Labs   CBC: Recent Labs  Lab 05/25/19 1747 05/26/19 0533 05/27/19 0513  WBC 12.0* 14.2* 11.4*  NEUTROABS 10.0*  --   --   HGB 12.0 10.8* 8.2*  HCT 38.9 34.3* 28.1*  MCV 88.6 91.7 93.4  PLT 86* 33* 25*     Basic Metabolic Panel: Recent Labs  Lab 05/25/19 1747 05/26/19 0533 05/27/19 0513  NA 136 139 146*  K 4.9 3.9 3.1*  CL 94* 105 110  CO2 26 20* 20*  GLUCOSE 133* 174* 196*  BUN 169* 145* 97*  CREATININE 3.69* 3.07* 1.92*  CALCIUM 9.6 8.1* 8.0*  MG  --  2.9* 2.4  PHOS  --  4.8* 3.4   GFR: Estimated Creatinine Clearance: 42.8 mL/min (A) (by C-G formula based on SCr of 1.92 mg/dL (H)). Recent Labs  Lab 05/25/19 1747 05/25/19 1831 05/26/19 0533 05/26/19 0725 05/26/19 0755 05/27/19 0513  WBC 12.0*  --  14.2*  --   --  11.4*  LATICACIDVEN  --  2.1*  --  1.0 0.9  --     Liver Function Tests: Recent Labs  Lab 05/25/19 1747 05/26/19 0533  AST 19 13*  ALT 12 11  ALKPHOS 83 77  BILITOT 1.5* 1.2  PROT 6.8 5.8*  ALBUMIN 2.5* 1.8*   No results for input(s): LIPASE, AMYLASE in the last 168 hours. No results for input(s): AMMONIA in the last 168 hours.  ABG    Component Value Date/Time   PHART 7.35 05/25/2019 2338   PCO2ART 44 05/25/2019 2338   PO2ART 84 05/25/2019 2338  HCO3 24.8 05/25/2019 2338   TCO2 30 09/30/2017 2312   ACIDBASEDEF 1.5 05/25/2019 2338   O2SAT 96.5 05/25/2019 2338     Coagulation Profile: Recent Labs  Lab 05/25/19 1747  INR 1.5*    Cardiac Enzymes: Recent Labs  Lab 05/25/19 1747  CKTOTAL 78    HbA1C: Hemoglobin A1C  Date/Time Value Ref Range Status  08/12/2018 02:09 PM 6.1 (A) 4.0 - 5.6 % Final  01/28/2018 02:23 PM 5.8 (A) 4.0 - 5.6 % Final   Hgb A1c MFr Bld  Date/Time Value Ref Range Status  05/20/2017 05:05 PM 6.1 4.6 - 6.5 % Final    Comment:    Glycemic Control Guidelines for People with Diabetes:Non Diabetic:  <6%Goal of Therapy: <7%Additional Action Suggested:  >8%   01/30/2017 04:29 PM 6.5 4.6 - 6.5 % Final    Comment:    Glycemic Control Guidelines for People with Diabetes:Non Diabetic:  <6%Goal of Therapy: <7%Additional Action Suggested:  >8%     CBG: Recent Labs  Lab 05/26/19 0350 05/26/19 2052 05/26/19 2342  05/27/19 0344  GLUCAP 156* 182* 187* 189*   35 minutes including greater than 50% of time in counseling and coordination of care.  Kipp Brood, MD [facility] ICU Physician [place]  Pager: [phone] Mobile: [phone] After hours: [phone].

## 2019-05-29 ENCOUNTER — Ambulatory Visit: Payer: Medicare Other | Admitting: Physician Assistant

## 2019-05-29 MED ORDER — COLLAGENASE 250 UNIT/GM EX OINT
TOPICAL_OINTMENT | Freq: Every day | CUTANEOUS | Status: DC
  Filled 2019-05-29: qty 30

## 2019-05-29 NOTE — Progress Notes (Signed)
[facility]:  Spoke to the pt's sister Kennyth Lose. She confirms interest in hospice bed at [facility]. She has approved to transfer to the [facility]. We will need to order a bariatric bed for the pt which can be delivered today. Kennyth Lose has scheduled test today at [facility] and will not be able to sign paperwork until tomorrow morning at the facility. TC to the SW Arron Braxton  to update her and see if pt will be ready for discharge tomorrow morning to transfer to the [facility]. Left VM for return call.  Webb Silversmith RN [phone]

## 2019-05-29 NOTE — Progress Notes (Signed)
PROGRESS NOTE                                                                                                                                                                                                             Patient Demographics:    Janice Morrow, is a 73 y.o. female, DOB - October 12, 1945, [id number]  Outpatient Primary MD for the patient is Janith Lima, MD    LOS - 3  Admit date - 05/26/2019    CC-AMS     Brief Narrative 73 year old African-American female who lives at an SNF with past medical history of OSA, bedbound state, COPD on 4 L nasal cannula, history of PE on Eliquis, hypertension dyslipidemia, CKD 3, grade 1 chronic diastolic CHF with EF 55 to 60%, chronic decubitus ulcer who presented to [facility], ICU with chief complaints of acute mental status change, further work-up showed large stage IV decubitus ulcer with infection and concerns for osteomyelitis.  CT head was nonacute.  Was in septic shock, due to her poor baseline PCCM physicians involved palliative care and family and it was decided that patient will be full comfort care.  She was transitioned to full comfort care with IV morphine drip, incidentally she was COVID 19+ hence she was sent to [facility] for further care.   Subjective:    Janice Morrow today remains obtunded in bed unable to answer questions but appears to be in no discomfort   Assessment  & Plan :      73 year old African-American female who lives at an SNF with past medical history of OSA, bedbound state, COPD on 4 L nasal cannula, history of PE on Eliquis, hypertension dyslipidemia, CKD 3, grade 1 chronic diastolic CHF with EF 55 to 60%, chronic decubitus ulcer who presented to [facility], ICU with chief complaints of acute mental status change, further work-up showed large stage IV decubitus ulcer with infection and concerns for osteomyelitis.  CT head was nonacute.  Was in septic shock,  due to her poor baseline PCCM physicians involved palliative care and family and it was decided that patient will be full comfort care.  She was transitioned to full comfort care with IV morphine drip, incidentally she was COVID 19+ hence she was sent to [facility] for further care.   Incidental COVID-19, full comfort care, on morphine drip, DNR goal  of care is comfort only likely to pass in the next few days, underlying cause is septic shock due to chronic decubitus ulcer infection with incidental COVID-19 infection.    Condition - Extremely Guarded  Family Communication  : None present  Code Status :  DNR  Diet :   Diet Order    None       Disposition Plan  : Residential hospice once we have a bed  Consults  : PCCM,  Pall Care  Procedures  :    PUD Prophylaxis :    DVT Prophylaxis  : Full comfort measures on morphine drip  Lab Results  Component Value Date   PLT 25 (LL) 05/27/2019    Inpatient Medications  Scheduled Meds:  collagenase  1 application Topical Daily   mouth rinse  15 mL Mouth Rinse BID   Continuous Infusions:  morphine 10 mg/hr (05/29/19 0307)   PRN Meds:.glycopyrrolate **OR** glycopyrrolate **OR** glycopyrrolate, haloperidol **OR** haloperidol **OR** haloperidol lactate, LORazepam, morphine  Antibiotics  :    Anti-infectives (From admission, onward)   Start     Dose/Rate Route Frequency Ordered Stop   05/27/19 1000  vancomycin (VANCOCIN) 1,750 mg in sodium chloride 0.9 % 500 mL IVPB     1,750 mg 250 mL/hr over 120 Minutes Intravenous  Once 05/27/19 0854 05/27/19 1313   05/26/19 0630  vancomycin (VANCOCIN) 1,500 mg in sodium chloride 0.9 % 500 mL IVPB     1,500 mg 250 mL/hr over 120 Minutes Intravenous  Once 05/26/19 0552 05/26/19 0958   05/26/19 0600  piperacillin-tazobactam (ZOSYN) IVPB 3.375 g  Status:  Discontinued     3.375 g 12.5 mL/hr over 240 Minutes Intravenous Every 8 hours 05/26/19 0552 05/28/19 0825   05/26/19 0553  vancomycin variable  dose per unstable renal function (pharmacist dosing)  Status:  Discontinued      Does not apply See admin instructions 05/26/19 0553 05/28/19 0825       Time Spent in minutes  30   Lala Lund M.D on 05/29/2019 at 9:49 AM  To page go to [email or web address] - password [facility]  [facility] -  Office  [phone]   See all Orders from today for further details    Objective:   Vitals:   05/28/19 0700 05/28/19 1500 05/28/19 1956 05/29/19 0749  BP:  (!) 104/55 (!) 49/38 (!) 65/36  Pulse: 92 74 63 76  Resp: 14 10 (!) 8 10  Temp:  (!) 97.5 F (36.4 C) 97.7 F (36.5 C) (!) 97.4 F (36.3 C)  TempSrc:  Axillary  Axillary  SpO2: 96% 100% 93% 98%  Weight:  (!) 169.3 kg    Height:  5\' 3"  (1.6 m)      Wt Readings from Last 3 Encounters:  05/28/19 (!) 169.3 kg  05/25/19 (!) 158.6 kg  01/14/19 (!) 158.6 kg     Intake/Output Summary (Last 24 hours) at 05/29/2019 0949 Last data filed at 05/29/2019 0800 Gross per 24 hour  Intake 457.21 ml  Output 100 ml  Net 357.21 ml     Physical Exam   Full exam deferred.  Morbidly obese middle-aged African-American female lying in hospital bed in no discomfort on morphine drip    Data Review:    CBC Recent Labs  Lab 05/25/19 1747 05/26/19 0533 05/27/19 0513  WBC 12.0* 14.2* 11.4*  HGB 12.0 10.8* 8.2*  HCT 38.9 34.3* 28.1*  PLT 86* 33* 25*  MCV 88.6 91.7 93.4  MCH 27.3 28.9 27.2  MCHC 30.8 31.5 29.2*  RDW 16.5* 16.7* 16.8*  LYMPHSABS 1.2  --   --   MONOABS 0.2  --   --   EOSABS 0.0  --   --   BASOSABS 0.1  --   --     Chemistries  Recent Labs  Lab 05/25/19 1747 05/26/19 0533 05/27/19 0513  NA 136 139 146*  K 4.9 3.9 3.1*  CL 94* 105 110  CO2 26 20* 20*  GLUCOSE 133* 174* 196*  BUN 169* 145* 97*  CREATININE 3.69* 3.07* 1.92*  CALCIUM 9.6 8.1* 8.0*  MG  --  2.9* 2.4  AST 19 13*  --   ALT 12 11  --   ALKPHOS 83 77  --   BILITOT 1.5* 1.2  --     ------------------------------------------------------------------------------------------------------------------ No results for input(s): CHOL, HDL, LDLCALC, TRIG, CHOLHDL, LDLDIRECT in the last 72 hours.  Lab Results  Component Value Date   HGBA1C 6.1 (A) 08/12/2018   ------------------------------------------------------------------------------------------------------------------ No results for input(s): TSH, T4TOTAL, T3FREE, THYROIDAB in the last 72 hours.  Invalid input(s): FREET3  Cardiac Enzymes No results for input(s): CKMB, TROPONINI, MYOGLOBIN in the last 168 hours.  Invalid input(s): CK ------------------------------------------------------------------------------------------------------------------    Component Value Date/Time   BNP 58.0 05/25/2019 1747    Micro Results Recent Results (from the past 240 hour(s))  Urine culture     Status: Abnormal   Collection Time: 05/25/19  5:47 PM   Specimen: Urine, Random  Result Value Ref Range Status   Specimen Description   Final    URINE, RANDOM Performed at [facility], [address], [place], [place] [address]    Special Requests   Final    NONE Performed at [facility], [address] [place], [place] [address]    Culture >=100,000 COLONIES/mL KLEBSIELLA PNEUMONIAE (A)  Final   Report Status 05/28/2019 FINAL  Final   Organism ID, Bacteria KLEBSIELLA PNEUMONIAE (A)  Final      Susceptibility   Klebsiella pneumoniae - MIC*    AMPICILLIN RESISTANT Resistant     CEFAZOLIN <=4 SENSITIVE Sensitive     CEFTRIAXONE <=1 SENSITIVE Sensitive     CIPROFLOXACIN <=0.25 SENSITIVE Sensitive     GENTAMICIN <=1 SENSITIVE Sensitive     IMIPENEM <=0.25 SENSITIVE Sensitive     NITROFURANTOIN 32 SENSITIVE Sensitive     TRIMETH/SULFA <=20 SENSITIVE Sensitive     AMPICILLIN/SULBACTAM 4 SENSITIVE Sensitive     PIP/TAZO <=4 SENSITIVE Sensitive     Extended ESBL NEGATIVE Sensitive     * >=100,000  COLONIES/mL KLEBSIELLA PNEUMONIAE  Culture, blood (routine x 2)     Status: Abnormal   Collection Time: 05/25/19  6:31 PM   Specimen: BLOOD  Result Value Ref Range Status   Specimen Description   Final    BLOOD RIGHT GROIN Performed at [facility], [place] [address], [place], [place] [address]    Special Requests   Final    BOTTLES DRAWN AEROBIC AND ANAEROBIC Blood Culture adequate volume Performed at [facility], [address] [place], [place] [address]    Culture  Setup Time   Final    ANAEROBIC BOTTLE ONLY GRAM POSITIVE RODS CRITICAL RESULT CALLED TO, READ BACK BY AND VERIFIED WITH: MIKE MACCIA AT [id number] ON 05/26/2019 [place]. Performed at [facility], [address], [place], [place] [address]    Culture CLOSTRIDIUM PERFRINGENS (A)  Final   Report Status 05/28/2019 FINAL  Final  SARS Coronavirus  2 [facility] order, Performed in [facility] hospital lab) Nasopharyngeal Nasopharyngeal Swab     Status: Abnormal   Collection Time: 05/25/19  9:21 PM   Specimen: Nasopharyngeal Swab  Result Value Ref Range Status   SARS Coronavirus 2 POSITIVE (A) NEGATIVE Final    Comment: RESULT CALLED TO, READ BACK BY AND VERIFIED WITH: KATE Domingo Madeira RN AT 2228 ON 05/25/2019 [facility] (NOTE) If result is NEGATIVE SARS-CoV-2 target nucleic acids are NOT DETECTED. The SARS-CoV-2 RNA is generally detectable in upper and lower  respiratory specimens during the acute phase of infection. The lowest  concentration of SARS-CoV-2 viral copies this assay can detect is 250  copies / mL. A negative result does not preclude SARS-CoV-2 infection  and should not be used as the sole basis for treatment or other  patient management decisions.  A negative result may occur with  improper specimen collection / handling, submission of specimen other  than nasopharyngeal swab, presence of viral mutation(s) within the  areas targeted by this assay, and inadequate number of viral copies  (<250 copies /  mL). A negative result must be combined with clinical  observations, patient history, and epidemiological information. If result is POSITIVE SARS-CoV-2 target nucleic acids are DETECTE D. The SARS-CoV-2 RNA is generally detectable in upper and lower  respiratory specimens during the acute phase of infection.  Positive  results are indicative of active infection with SARS-CoV-2.  Clinical  correlation with patient history and other diagnostic information is  necessary to determine patient infection status.  Positive results do  not rule out bacterial infection or co-infection with other viruses. If result is PRESUMPTIVE POSTIVE SARS-CoV-2 nucleic acids MAY BE PRESENT.   A presumptive positive result was obtained on the submitted specimen  and confirmed on repeat testing.  While 2019 novel coronavirus  (SARS-CoV-2) nucleic acids may be present in the submitted sample  additional confirmatory testing may be necessary for epidemiological  and / or clinical management purposes  to differentiate between  SARS-CoV-2 and other Sarbecovirus currently known to infect humans.  If clinically indicated additional testing with an alternate test  methodology (LAB745 3) is advised. The SARS-CoV-2 RNA is generally  detectable in upper and lower respiratory specimens during the acute  phase of infection. The expected result is Negative. Fact Sheet for Patients:  StrictlyIdeas.no Fact Sheet for Healthcare Providers: BankingDealers.co.za This test is not yet approved or cleared by the [place] FDA and has been authorized for detection and/or diagnosis of SARS-CoV-2 by FDA under an Emergency Use Authorization (EUA).  This EUA will remain in effect (meaning this test can be used) for the duration of the COVID-19 declaration under Section 564(b)(1) of the Act, 21 U.S.C. section 360bbb-3(b)(1), unless the authorization is terminated or revoked  sooner. Performed at [facility], [address] [place], [place] [address]   MRSA PCR Screening     Status: None   Collection Time: 05/26/19  5:41 AM   Specimen: Nasal Mucosa; Nasopharyngeal  Result Value Ref Range Status   MRSA by PCR NEGATIVE NEGATIVE Final    Comment:        The GeneXpert MRSA Assay (FDA approved for NASAL specimens only), is one component of a comprehensive MRSA colonization surveillance program. It is not intended to diagnose MRSA infection nor to guide or monitor treatment for MRSA infections. Performed at [facility], [place] [address], [place], [place] [address]     Radiology Reports Ct Abdomen Pelvis Wo Contrast  Result  Date: 05/25/2019 CLINICAL DATA:  Large decubitus ulcer EXAM: CT ABDOMEN AND PELVIS WITHOUT CONTRAST TECHNIQUE: Multidetector CT imaging of the abdomen and pelvis was performed following the standard protocol without IV contrast. COMPARISON:  01/10/2019 FINDINGS: Lower chest: Lung bases demonstrate no acute consolidation or pleural effusion. Heart size upper limits of normal. Hepatobiliary: Gallstone. No focal hepatic abnormality or biliary dilatation. Pancreas: Unremarkable. No pancreatic ductal dilatation or surrounding inflammatory changes. Spleen: Normal in size without focal abnormality. Adrenals/Urinary Tract: Adrenal glands are normal. Probable cyst in the right kidney. Left perinephric edema and upper pole atrophy. No hydronephrosis. Parenchymal and intrarenal calcifications on the left. Urinary bladder contains Foley catheter. Stomach/Bowel: Stomach is within normal limits. Appendix appears normal. No evidence of bowel wall thickening, distention, or inflammatory changes. Vascular/Lymphatic: Mild aortic atherosclerosis. No aneurysmal dilatation. No significantly enlarged lymph nodes. Reproductive: Uterus and bilateral adnexa are unremarkable. Other: No free air.  Prior ventral/umbilical hernia repair Musculoskeletal: Diffuse  fatty atrophy of the pelvic, gluteal and thigh musculature. Large decubitus wound/ulcer spanning from L5-S1 level to the coccyx. Wound extends to the bony surface of the sacrum. When compared with prior CT, coccygeal bones are no longer identified. No focal fluid collections to suggest abscess. IMPRESSION: 1. Large sacral decubitus wound/ulcer, extends down to the bony surface of the sacrum. When compared to the most recent prior CT, coccygeal bones are no longer visualized, presumably due to osteomyelitis. No gross focal fluid collection to suggest abscess 2. Gallstone 3. Perinephric edema and scarring/atrophy upper pole left kidney as before. Multiple stones and calcifications in the upper pole of the left kidney Electronically Signed   By: Donavan Foil M.D.   On: 05/25/2019 20:00   Ct Head Wo Contrast  Result Date: 05/25/2019 CLINICAL DATA:  73 year old female with altered mental status. Decreased p.o. Large decubitus ulcer. EXAM: CT HEAD WITHOUT CONTRAST TECHNIQUE: Contiguous axial images were obtained from the base of the skull through the vertex without intravenous contrast. COMPARISON:  Brain MRI 10/28/2017, head CT 11/20/2014. FINDINGS: Brain: Cerebral volume is only mildly decreased since 2016, within normal limits for age. Gray-white matter differentiation is within normal limits for age. No midline shift, ventriculomegaly, mass effect, evidence of mass lesion, intracranial hemorrhage or evidence of cortically based acute infarction. Vascular: Calcified atherosclerosis at the skull base. No suspicious intracranial vascular hyperdensity. Skull: No acute osseous abnormality identified. Sinuses/Orbits: Mild bubbly opacity in both sphenoid sinuses is new. Other Visualized paranasal sinuses and mastoids are stable and well pneumatized. Other: No acute orbit or scalp soft tissue finding. IMPRESSION: 1. No acute intracranial abnormality. Negative for age non contrast CT appearance of the brain. 2. Mild  sphenoid sinus inflammation is new since 2019. Electronically Signed   By: Genevie Ann M.D.   On: 05/25/2019 19:47   Dg Chest Port 1 View  Result Date: 05/25/2019 CLINICAL DATA:  AMS EXAM: PORTABLE CHEST 1 VIEW COMPARISON:  Chest radiograph 01/14/2019, 01/09/2019 FINDINGS: Stable cardiomediastinal contours with enlarged heart size. Unchanged bilateral hilar enlargement consistent with enlarged pulmonary arteries. There are prominent bilateral interstitial markings without a new focal pulmonary opacity. No pneumothorax or large pleural effusion. No acute findings in the visualized skeleton. IMPRESSION: No new focal pulmonary opacity. Prominent bilateral interstitium could represent trace edema or bronchitis in the appropriate clinical setting. Electronically Signed   By: Audie Pinto M.D.   On: 05/25/2019 18:33

## 2019-05-29 NOTE — Discharge Instructions (Signed)
Disposition.  Residential hospice °Condition.  Guarded °CODE STATUS.  DNR °Activity.  With assistance as tolerated, full fall precautions. °Diet.  Soft with feeding assistance and aspiration precautions. °Goal of care.  Comfort. ° °

## 2019-05-29 NOTE — Discharge Summary (Signed)
Janice Morrow [id number] DOB: [birth year] DOA: 05/26/2019  PCP: Janith Lima, MD  Admit date: 05/26/2019  Discharge date: 05/30/2019  Admitted From: SNF   Disposition:  Res.Hospice   Recommendations for Outpatient Follow-up:   Follow up with PCP in 1-2 weeks  PCP Please obtain BMP/CBC, 2 view CXR in 1week,  (see Discharge instructions)   PCP Please follow up on the following pending results:    Home Health:    Equipment/Devices:   Consultations: None  Discharge Condition: Guarded   CODE STATUS: DNR   Diet Recommendation: Comfort foods only, soft diet with feeding assistance and aspiration precautions only for patient's comfort if desired.  Diet Order    None       CC - AMS   Brief history of present illness from the day of admission and additional interim summary     73 year old African-American female who lives at an SNF with past medical history of OSA, bedbound state, COPD on 4 L nasal cannula, history of PE on Eliquis, hypertension dyslipidemia, CKD 3, grade 1 chronic diastolic CHF with EF 55 to 60%, chronic decubitus ulcer who presented to [facility], ICU with chief complaints of acute mental status change, further work-up showed large stage IV decubitus ulcer with infection and concerns for osteomyelitis.  CT head was nonacute.  Was in septic shock, due to her poor baseline PCCM physicians involved palliative care and family and it was decided that patient will be full comfort care.  She was transitioned to full comfort care with IV morphine drip, incidentally she was COVID 19+ hence she was sent to [facility] for further care.   Incidental COVID-19, full comfort care, on morphine drip, DNR goal of care is comfort only likely to pass in the next few days, underlying cause is septic shock due to chronic  decubitus ulcer infection with incidental COVID-19 infection.  She will be transferred to residential hospice once bed is available.   Discharge diagnosis     Active Problems:   Shock ([place])   Stage IV pressure ulcer of sacral region [facility])   Palliative care by specialist   Comfort measures only status    Discharge instructions    Discharge Instructions    Discharge instructions   Complete by: As directed    Disposition.  Residential hospice Condition.  Guarded CODE STATUS.  DNR Activity.  With assistance as tolerated, full fall precautions. Diet.  Soft with feeding assistance and aspiration precautions. Goal of care.  Comfort.      Discharge Medications   Allergies as of 05/30/2019      Reactions   Baclofen Other (See Comments)   Causes seizures   Imdur [isosorbide Dinitrate] Other (See Comments)   Severe persistent headache   Naproxen Palpitations      Medication List    STOP taking these medications   carvedilol 6.25 MG tablet Commonly known as: COREG   Eliquis 5 MG Tabs tablet Generic drug: apixaban   furosemide 40 MG tablet Commonly known as: LASIX  gabapentin 400 MG capsule Commonly known as: NEURONTIN   ipratropium-albuterol 0.5-2.5 (3) MG/3ML Soln Commonly known as: DUONEB   OXYGEN   umeclidinium-vilanterol 62.5-25 MCG/INH Aepb Commonly known as: Anoro Ellipta     TAKE these medications   LORazepam 2 MG/ML concentrated solution Commonly known as: ATIVAN Take 0.5 mLs (1 mg total) by mouth every 6 (six) hours as needed for anxiety.   morphine CONCENTRATE 10 MG/0.5ML Soln concentrated solution Take 0.5 mLs (10 mg total) by mouth every 3 (three) hours as needed for moderate pain or severe pain.         Major procedures and Radiology Reports - PLEASE review detailed and final reports thoroughly  -         Ct Abdomen Pelvis Wo Contrast  Result Date: 05/25/2019 CLINICAL DATA:  Large decubitus ulcer EXAM: CT ABDOMEN AND PELVIS WITHOUT  CONTRAST TECHNIQUE: Multidetector CT imaging of the abdomen and pelvis was performed following the standard protocol without IV contrast. COMPARISON:  01/10/2019 FINDINGS: Lower chest: Lung bases demonstrate no acute consolidation or pleural effusion. Heart size upper limits of normal. Hepatobiliary: Gallstone. No focal hepatic abnormality or biliary dilatation. Pancreas: Unremarkable. No pancreatic ductal dilatation or surrounding inflammatory changes. Spleen: Normal in size without focal abnormality. Adrenals/Urinary Tract: Adrenal glands are normal. Probable cyst in the right kidney. Left perinephric edema and upper pole atrophy. No hydronephrosis. Parenchymal and intrarenal calcifications on the left. Urinary bladder contains Foley catheter. Stomach/Bowel: Stomach is within normal limits. Appendix appears normal. No evidence of bowel wall thickening, distention, or inflammatory changes. Vascular/Lymphatic: Mild aortic atherosclerosis. No aneurysmal dilatation. No significantly enlarged lymph nodes. Reproductive: Uterus and bilateral adnexa are unremarkable. Other: No free air.  Prior ventral/umbilical hernia repair Musculoskeletal: Diffuse fatty atrophy of the pelvic, gluteal and thigh musculature. Large decubitus wound/ulcer spanning from L5-S1 level to the coccyx. Wound extends to the bony surface of the sacrum. When compared with prior CT, coccygeal bones are no longer identified. No focal fluid collections to suggest abscess. IMPRESSION: 1. Large sacral decubitus wound/ulcer, extends down to the bony surface of the sacrum. When compared to the most recent prior CT, coccygeal bones are no longer visualized, presumably due to osteomyelitis. No gross focal fluid collection to suggest abscess 2. Gallstone 3. Perinephric edema and scarring/atrophy upper pole left kidney as before. Multiple stones and calcifications in the upper pole of the left kidney Electronically Signed   By: Donavan Foil M.D.   On:  05/25/2019 20:00   Ct Head Wo Contrast  Result Date: 05/25/2019 CLINICAL DATA:  73 year old female with altered mental status. Decreased p.o. Large decubitus ulcer. EXAM: CT HEAD WITHOUT CONTRAST TECHNIQUE: Contiguous axial images were obtained from the base of the skull through the vertex without intravenous contrast. COMPARISON:  Brain MRI 10/28/2017, head CT 11/20/2014. FINDINGS: Brain: Cerebral volume is only mildly decreased since 2016, within normal limits for age. Gray-white matter differentiation is within normal limits for age. No midline shift, ventriculomegaly, mass effect, evidence of mass lesion, intracranial hemorrhage or evidence of cortically based acute infarction. Vascular: Calcified atherosclerosis at the skull base. No suspicious intracranial vascular hyperdensity. Skull: No acute osseous abnormality identified. Sinuses/Orbits: Mild bubbly opacity in both sphenoid sinuses is new. Other Visualized paranasal sinuses and mastoids are stable and well pneumatized. Other: No acute orbit or scalp soft tissue finding. IMPRESSION: 1. No acute intracranial abnormality. Negative for age non contrast CT appearance of the brain. 2. Mild sphenoid sinus inflammation is new since 2019. Electronically Signed   By:  Genevie Ann M.D.   On: 05/25/2019 19:47   Dg Chest Port 1 View  Result Date: 05/25/2019 CLINICAL DATA:  AMS EXAM: PORTABLE CHEST 1 VIEW COMPARISON:  Chest radiograph 01/14/2019, 01/09/2019 FINDINGS: Stable cardiomediastinal contours with enlarged heart size. Unchanged bilateral hilar enlargement consistent with enlarged pulmonary arteries. There are prominent bilateral interstitial markings without a new focal pulmonary opacity. No pneumothorax or large pleural effusion. No acute findings in the visualized skeleton. IMPRESSION: No new focal pulmonary opacity. Prominent bilateral interstitium could represent trace edema or bronchitis in the appropriate clinical setting. Electronically Signed   By:  Audie Pinto M.D.   On: 05/25/2019 18:33    Micro Results     Recent Results (from the past 240 hour(s))  Urine culture     Status: Abnormal   Collection Time: 05/25/19  5:47 PM   Specimen: Urine, Random  Result Value Ref Range Status   Specimen Description   Final    URINE, RANDOM Performed at [facility], [address] [place], [place] [address]    Special Requests   Final    NONE Performed at [facility], [address] [place], [place] [address]    Culture >=100,000 COLONIES/mL KLEBSIELLA PNEUMONIAE (A)  Final   Report Status 05/28/2019 FINAL  Final   Organism ID, Bacteria KLEBSIELLA PNEUMONIAE (A)  Final      Susceptibility   Klebsiella pneumoniae - MIC*    AMPICILLIN RESISTANT Resistant     CEFAZOLIN <=4 SENSITIVE Sensitive     CEFTRIAXONE <=1 SENSITIVE Sensitive     CIPROFLOXACIN <=0.25 SENSITIVE Sensitive     GENTAMICIN <=1 SENSITIVE Sensitive     IMIPENEM <=0.25 SENSITIVE Sensitive     NITROFURANTOIN 32 SENSITIVE Sensitive     TRIMETH/SULFA <=20 SENSITIVE Sensitive     AMPICILLIN/SULBACTAM 4 SENSITIVE Sensitive     PIP/TAZO <=4 SENSITIVE Sensitive     Extended ESBL NEGATIVE Sensitive     * >=100,000 COLONIES/mL KLEBSIELLA PNEUMONIAE  Culture, blood (routine x 2)     Status: Abnormal   Collection Time: 05/25/19  6:31 PM   Specimen: BLOOD  Result Value Ref Range Status   Specimen Description   Final    BLOOD RIGHT GROIN Performed at [facility], [place] [address] [place], [place] [address]    Special Requests   Final    BOTTLES DRAWN AEROBIC AND ANAEROBIC Blood Culture adequate volume Performed at [facility], [address] [place], [place] [address]    Culture  Setup Time   Final    ANAEROBIC BOTTLE ONLY GRAM POSITIVE RODS CRITICAL RESULT CALLED TO, READ BACK BY AND VERIFIED WITH: MIKE MACCIA AT [id number] ON 05/26/2019 [place]. Performed at [facility], [address] [place], [place] [address]    Culture  CLOSTRIDIUM PERFRINGENS (A)  Final   Report Status 05/28/2019 FINAL  Final  SARS Coronavirus 2 [facility] order, Performed in [facility] hospital lab) Nasopharyngeal Nasopharyngeal Swab     Status: Abnormal   Collection Time: 05/25/19  9:21 PM   Specimen: Nasopharyngeal Swab  Result Value Ref Range Status   SARS Coronavirus 2 POSITIVE (A) NEGATIVE Final    Comment: RESULT CALLED TO, READ BACK BY AND VERIFIED WITH: Corrie Mckusick RN AT 2228 ON 05/25/2019 [facility] (NOTE) If result is NEGATIVE SARS-CoV-2 target nucleic acids are NOT DETECTED. The SARS-CoV-2 RNA is generally detectable in upper and lower  respiratory specimens during the acute phase of infection. The lowest  concentration of  SARS-CoV-2 viral copies this assay can detect is 250  copies / mL. A negative result does not preclude SARS-CoV-2 infection  and should not be used as the sole basis for treatment or other  patient management decisions.  A negative result may occur with  improper specimen collection / handling, submission of specimen other  than nasopharyngeal swab, presence of viral mutation(s) within the  areas targeted by this assay, and inadequate number of viral copies  (<250 copies / mL). A negative result must be combined with clinical  observations, patient history, and epidemiological information. If result is POSITIVE SARS-CoV-2 target nucleic acids are DETECTE D. The SARS-CoV-2 RNA is generally detectable in upper and lower  respiratory specimens during the acute phase of infection.  Positive  results are indicative of active infection with SARS-CoV-2.  Clinical  correlation with patient history and other diagnostic information is  necessary to determine patient infection status.  Positive results do  not rule out bacterial infection or co-infection with other viruses. If result is PRESUMPTIVE POSTIVE SARS-CoV-2 nucleic acids MAY BE PRESENT.   A presumptive positive result was obtained on the submitted specimen    and confirmed on repeat testing.  While 2019 novel coronavirus  (SARS-CoV-2) nucleic acids may be present in the submitted sample  additional confirmatory testing may be necessary for epidemiological  and / or clinical management purposes  to differentiate between  SARS-CoV-2 and other Sarbecovirus currently known to infect humans.  If clinically indicated additional testing with an alternate test  methodology (LAB745 3) is advised. The SARS-CoV-2 RNA is generally  detectable in upper and lower respiratory specimens during the acute  phase of infection. The expected result is Negative. Fact Sheet for Patients:  StrictlyIdeas.no Fact Sheet for Healthcare Providers: BankingDealers.co.za This test is not yet approved or cleared by the [place] FDA and has been authorized for detection and/or diagnosis of SARS-CoV-2 by FDA under an Emergency Use Authorization (EUA).  This EUA will remain in effect (meaning this test can be used) for the duration of the COVID-19 declaration under Section 564(b)(1) of the Act, 21 U.S.C. section 360bbb-3(b)(1), unless the authorization is terminated or revoked sooner. Performed at [facility], [address] [place], [place] [address]   MRSA PCR Screening     Status: None   Collection Time: 05/26/19  5:41 AM   Specimen: Nasal Mucosa; Nasopharyngeal  Result Value Ref Range Status   MRSA by PCR NEGATIVE NEGATIVE Final    Comment:        The GeneXpert MRSA Assay (FDA approved for NASAL specimens only), is one component of a comprehensive MRSA colonization surveillance program. It is not intended to diagnose MRSA infection nor to guide or monitor treatment for MRSA infections. Performed at [facility], [place] [address] [place], [place] [address]     Today   Subjective    Janice Morrow  remains obtunded in bed unable to answer questions but appears to be in no discomfort    Objective   Blood pressure (!) 47/27, pulse 68, temperature 97.6 F (36.4 C), temperature source Axillary, resp. rate (!) 9, height 5\' 3"  (1.6 m), weight (!) 169.3 kg, last menstrual period 08/27/1993, SpO2 98 %.   Intake/Output Summary (Last 24 hours) at 05/30/2019 0719 Last data filed at 05/30/2019 0300 Gross per 24 hour  Intake 363.45 ml  Output 380 ml  Net -16.55 ml    Exam  Full exam deferred.  Morbidly obese middle-aged African-American female lying  in hospital bed in no discomfort on morphine drip    Data Review   CBC w Diff:  Lab Results  Component Value Date   WBC 11.4 (H) 05/27/2019   HGB 8.2 (L) 05/27/2019   HCT 28.1 (L) 05/27/2019   PLT 25 (LL) 05/27/2019   LYMPHOPCT 10 05/25/2019   MONOPCT 2 05/25/2019   EOSPCT 0 05/25/2019   BASOPCT 1 05/25/2019    CMP:  Lab Results  Component Value Date   NA 146 (H) 05/27/2019   NA 139 05/04/2013   K 3.1 (L) 05/27/2019   CL 110 05/27/2019   CO2 20 (L) 05/27/2019   BUN 97 (H) 05/27/2019   BUN 16 05/04/2013   CREATININE 1.92 (H) 05/27/2019   PROT 5.8 (L) 05/26/2019   PROT 6.8 05/04/2013   ALBUMIN 1.8 (L) 05/26/2019   ALBUMIN 4.0 05/04/2013   BILITOT 1.2 05/26/2019   ALKPHOS 77 05/26/2019   AST 13 (L) 05/26/2019   ALT 11 05/26/2019  .   Total Time in preparing paper work, data evaluation and todays exam - 54 minutes  Lala Lund M.D on 05/30/2019 at 7:19 AM  [facility]   Office  [phone]

## 2019-05-29 NOTE — Progress Notes (Signed)
Spoke with Leighton Ruff, SW with [facility].  Update given.  To be called when patient passes to close case.

## 2019-05-29 NOTE — TOC Progression Note (Signed)
Transition of Care [facility]) - Progression Note    Patient Details  Name: Janice Morrow MRN: [id number] Date of Birth: 06-15-1946  Transition of Care [facility]) CM/SW [place], LCSW Phone Number: 05/29/2019, 10:55 AM  Clinical Narrative:     CSW received call from, Cheri with [facility], confirming patient is able to transport to their facility tomorrow 10/3 once paperwork has been completed. Patient will need transportation via EMS.   If there are any questions or concerns please call Webb Silversmith at [phone]   Expected Discharge Plan: [place] Barriers to Discharge: Hospice Bed not available(hospice bed will be available 10/3)  Expected Discharge Plan and Services Expected Discharge Plan: [place]         Expected Discharge Date: 05/29/19                                     Social Determinants of Health (SDOH) Interventions    Readmission Risk Interventions No flowsheet data found.

## 2019-05-29 NOTE — Consult Note (Signed)
   [facility] CM Inpatient Consult   05/29/2019  Janice Morrow Mar 08, 1946 [id number]    Patient screened for  potential [place] Management services needed with a 24% high risk score for unplanned readmission and hospitalizations. Patient is in the [place].    Patient has a prior [place] Management outreach in the past. Per chart review, found that patient transitioned to long term care at [facility].  Review of patient's medical record reveals that: 73 year old female presents from SNF with AMS and hypotension. Past medical history of OSA, bedbound state, COPD on 4 L nasal cannula, history of PE on Eliquis, hypertension dyslipidemia, CKD 3, grade 1 chronic diastolic CHF with EF 55 to 60%, chronic decubitus ulcer,   who presented to [facility], ICU with chief complaints of acute mental status change, further work-up showed large stage IV decubitus ulcer with infection and concerns for osteomyelitis. CT head was non- acute. Was in septic shock, due to her poor baseline PCCM physicians involved palliative care and family, and it was decided that patient will be full comfort care. She was transitioned to full comfort care with IV morphine drip, incidentally she was COVID 19+ hence she was sent to [facility] for further care.    She will be transferred to residential hospice once bed is available.  Per transition of care SW note, [facility], confirming patient is able to transport to their facility tomorrow 10/3.  Patient will have full case management services through [facility] and will sign off.   For questions and additional information, please call:  Chenay Nesmith A. Kenndra Morris, BSN, RN-BC [facility] Liaison Cell: [phone]

## 2019-05-30 MED ORDER — LORAZEPAM 2 MG/ML PO CONC: 1.0000 mg | Freq: Four times a day (QID) | ORAL | 0 refills | Status: AC | PRN

## 2019-05-30 MED ORDER — MORPHINE SULFATE (PF) 2 MG/ML IV SOLN
4.0000 mg | Freq: Once | INTRAVENOUS | Status: AC
  Administered 2019-05-30: 13:00:00 4 mg via INTRAVENOUS
  Filled 2019-05-30: qty 2

## 2019-05-30 MED ORDER — MORPHINE SULFATE (CONCENTRATE) 10 MG/0.5ML PO SOLN: 10.0000 mg | ORAL | 0 refills | Status: AC | PRN

## 2019-05-30 NOTE — Progress Notes (Signed)
Patient transfer-[facility]  Primary nurse notified by Charge Nurse and SW of patient transfer to Hospice via [place]. Report called to receiving nurse Arlean Hopping RN at [phone]. Primary RN completed wound care and gave patient bed bath with foley/peri care. Patient had Morphine gtt infusing and completed (bag empty) by the time [facility] arrived to pick up patient for transport to hospice house. Primary RN gave patient Morphine 4mg  IV push at 1323 for comfort/45 minute ride to facility. Primary RN called receiving nurse to update status and make her aware that patient is enroute. Family notified by [place]. No belongings at bedside.

## 2019-05-30 NOTE — Care Management (Addendum)
CM confirmed that [facility] has bed available today - Hospice requesting pt be transferred at 11 am today as daughter will complete paper work at 10:30.  CSW confirmed discharge plan with sister this am.   [facility] arranged for 11am to transport pt to [facility].  Report number is [phone].  Transport pkg including DNR printed at [facility].

## 2019-06-17 ENCOUNTER — Telehealth: Payer: Self-pay

## 2019-06-17 NOTE — Telephone Encounter (Signed)
Copied from [place] [id number]. Topic: General - Inquiry >> Jun 16, 2019 10:08 AM Mathis Bud wrote: Reason for CRM: Patient brother [name] [place] called stating patient passed on 2023-06-18.  Call back [phone]

## 2019-06-28 DEATH — deceased

## 2019-08-31 IMAGING — CR DG CHEST 2V
2 series · 2 of 2 positions shown · non-contrast
Comparison: 02/19/2017

CLINICAL DATA: Hypoxemia, 86% oxygen saturation on room air, COPD,
chest pain, weakness, dizziness

EXAM:
CHEST  2 VIEW

[w chest lat]
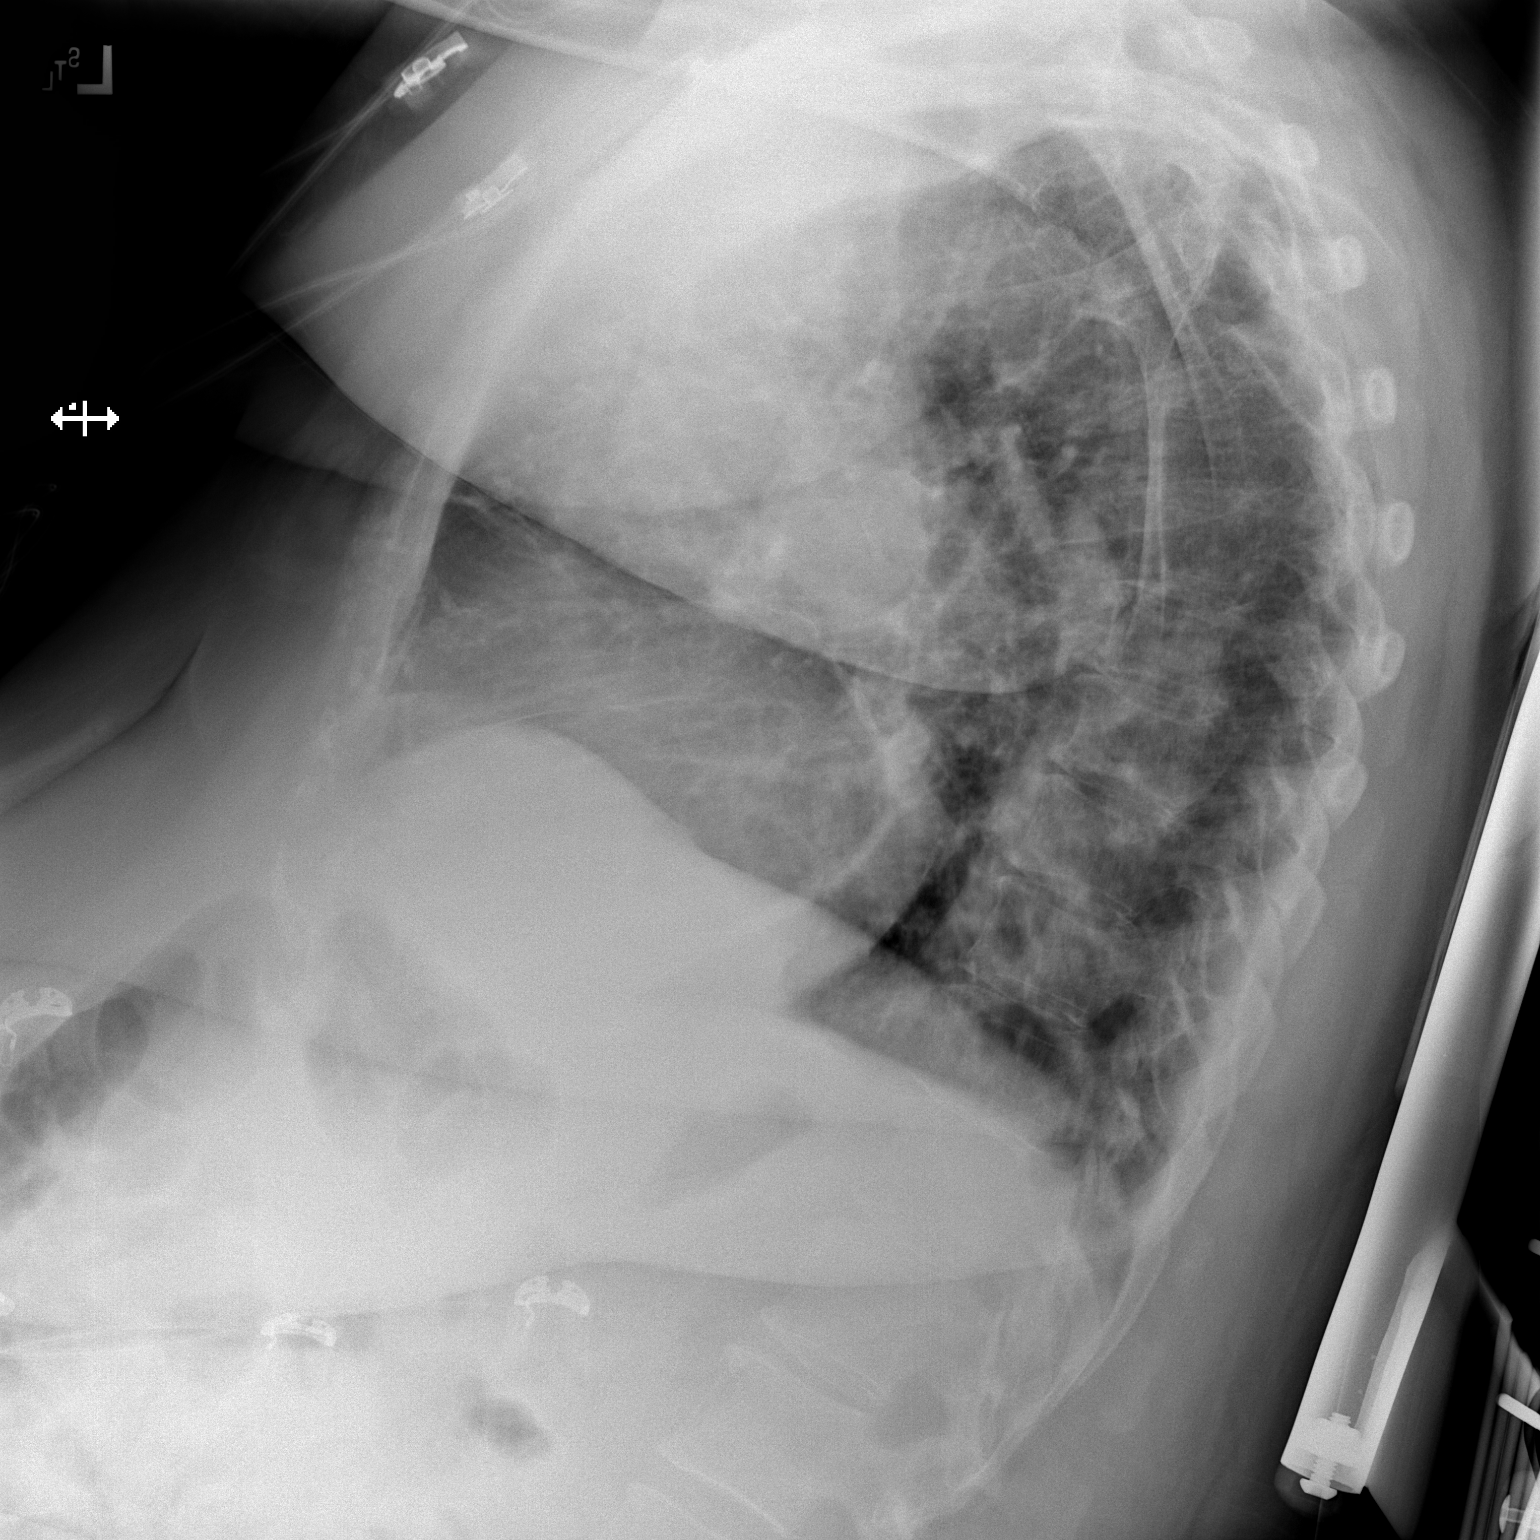

[x chest ap]
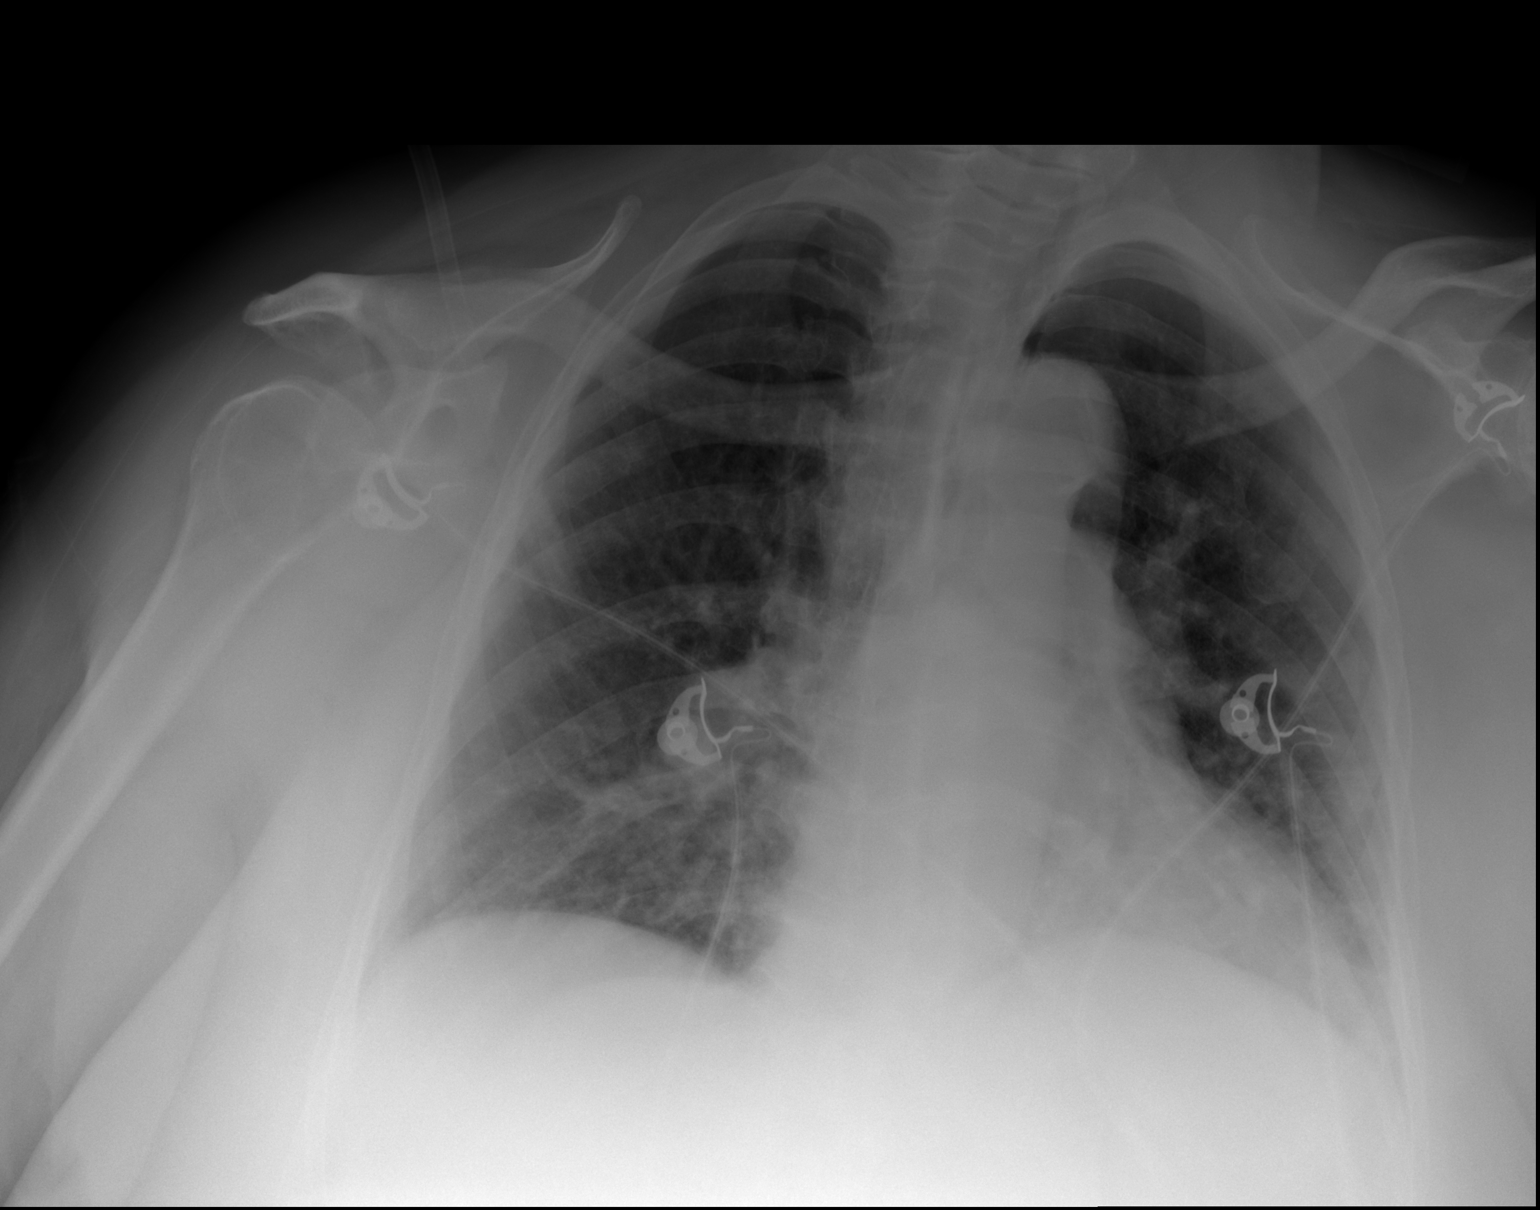

[2 of 2 positions shown; findings below may reference images not displayed]

FINDINGS: Normal heart size, mediastinal contours, and pulmonary vascularity.

Minimal atelectasis at RIGHT.

Lungs emphysematous but otherwise grossly clear.

Atherosclerotic calcifications at aortic arch.

No pleural effusion or pneumothorax.
IMPRESSION: Emphysematous changes with minimal RIGHT basilar atelectasis.

## 2019-08-31 IMAGING — CR DG KNEE COMPLETE 4+V*R*
5 series · 5 of 5 positions shown · non-contrast
Comparison: 11/16/2016

CLINICAL DATA: Right knee pain

EXAM:
RIGHT KNEE - COMPLETE 4+ VIEW

[x knee ap right (1 of 4)]
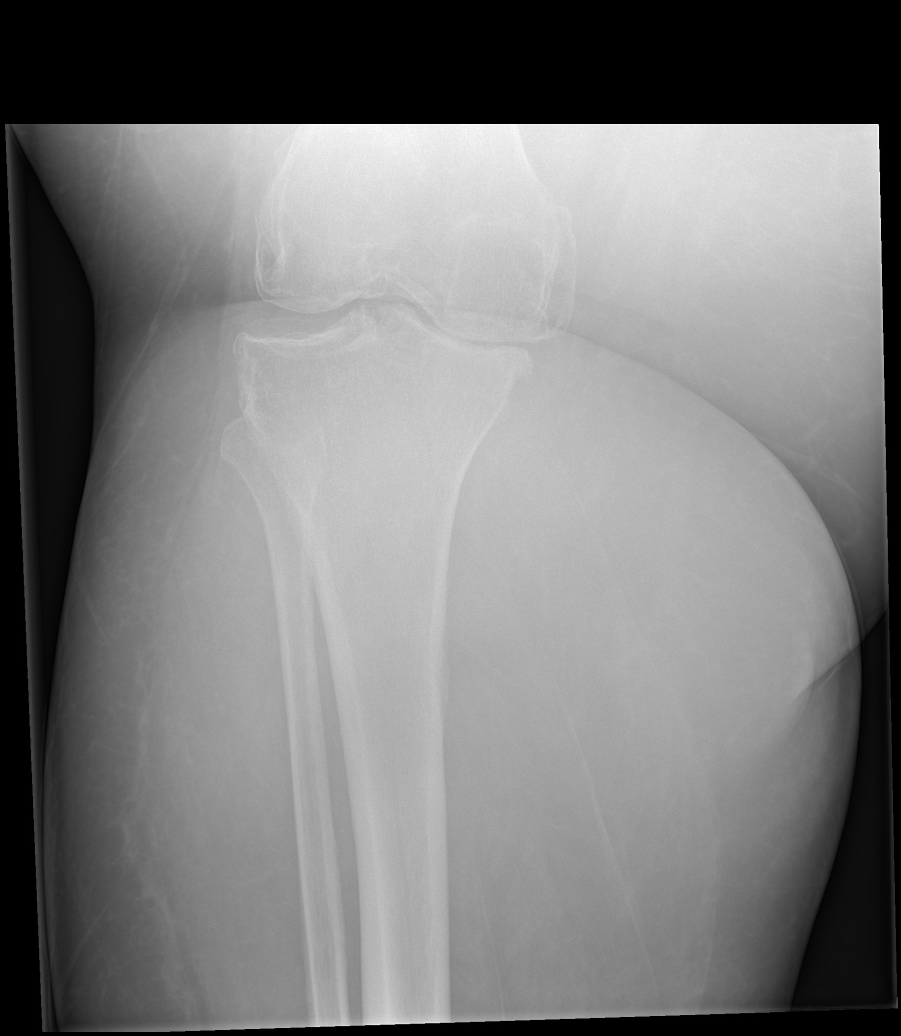

[x knee ap right (2 of 4)]
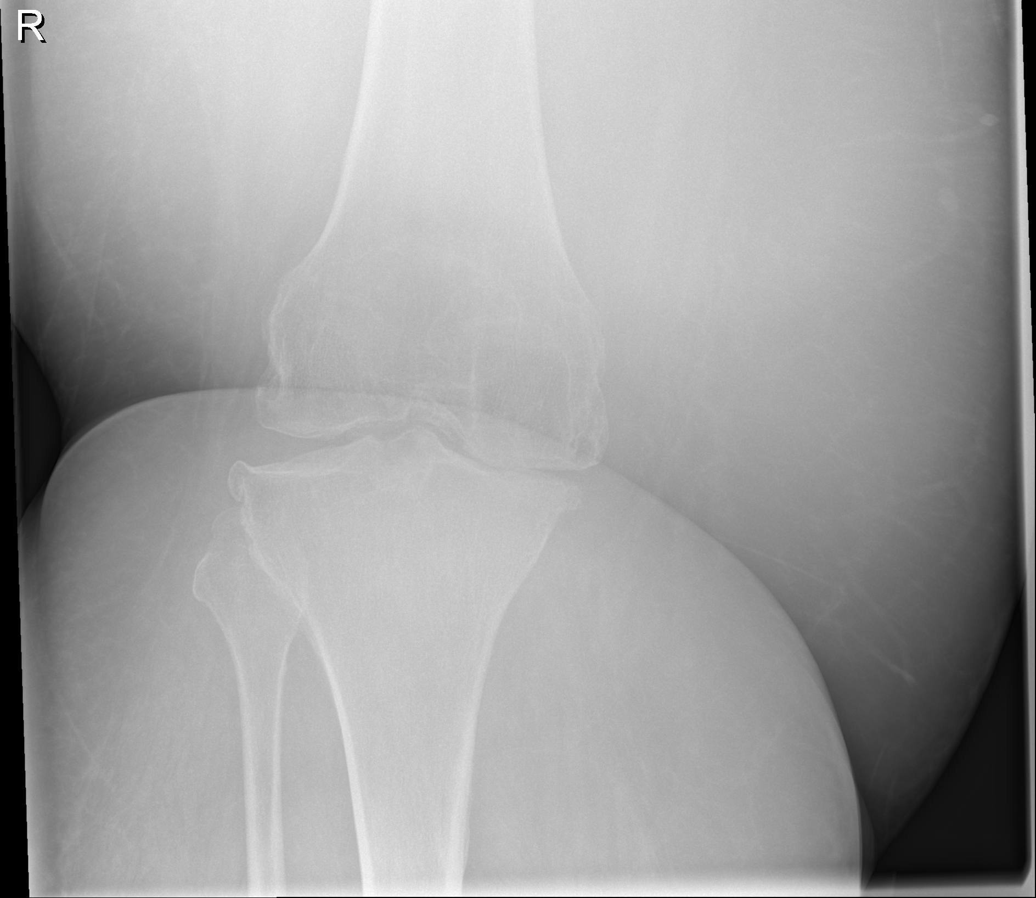

[x knee ap right (3 of 4)]
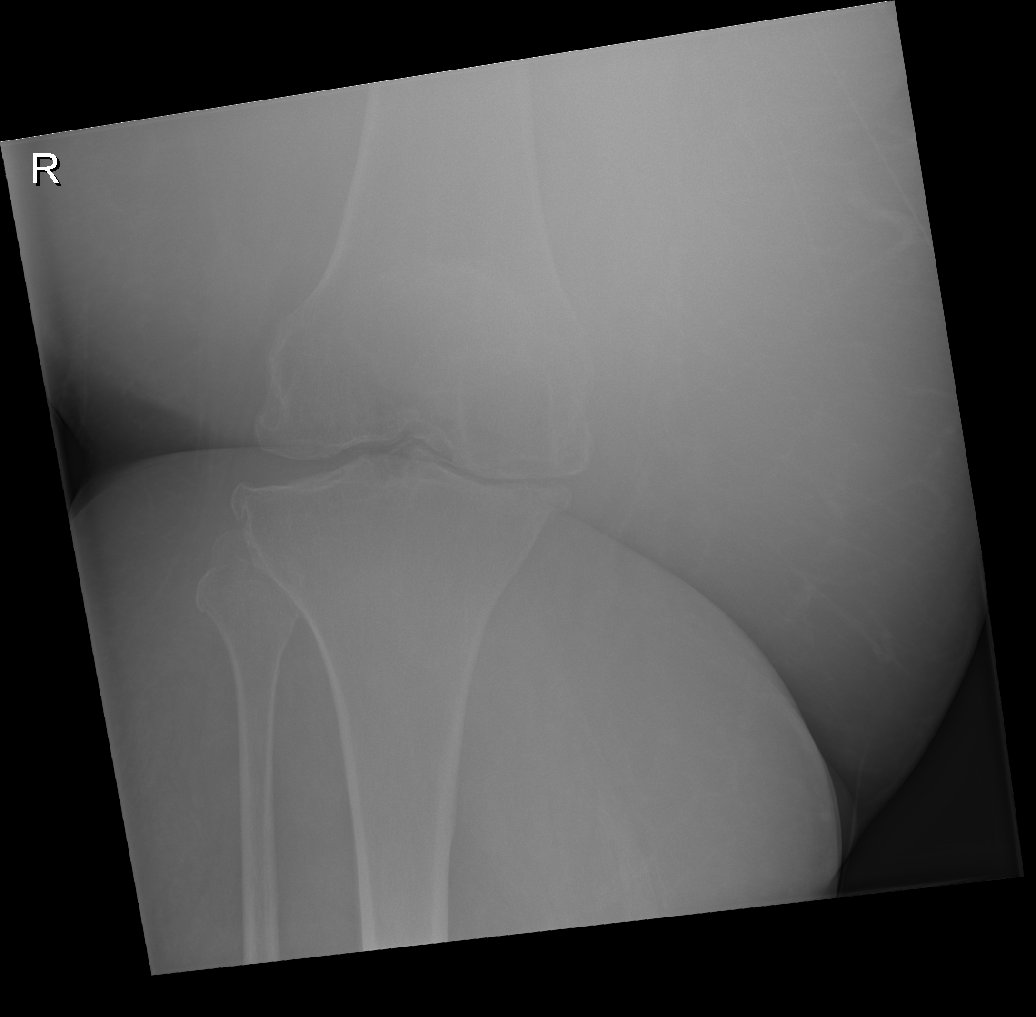

[x knee ap right (4 of 4)]
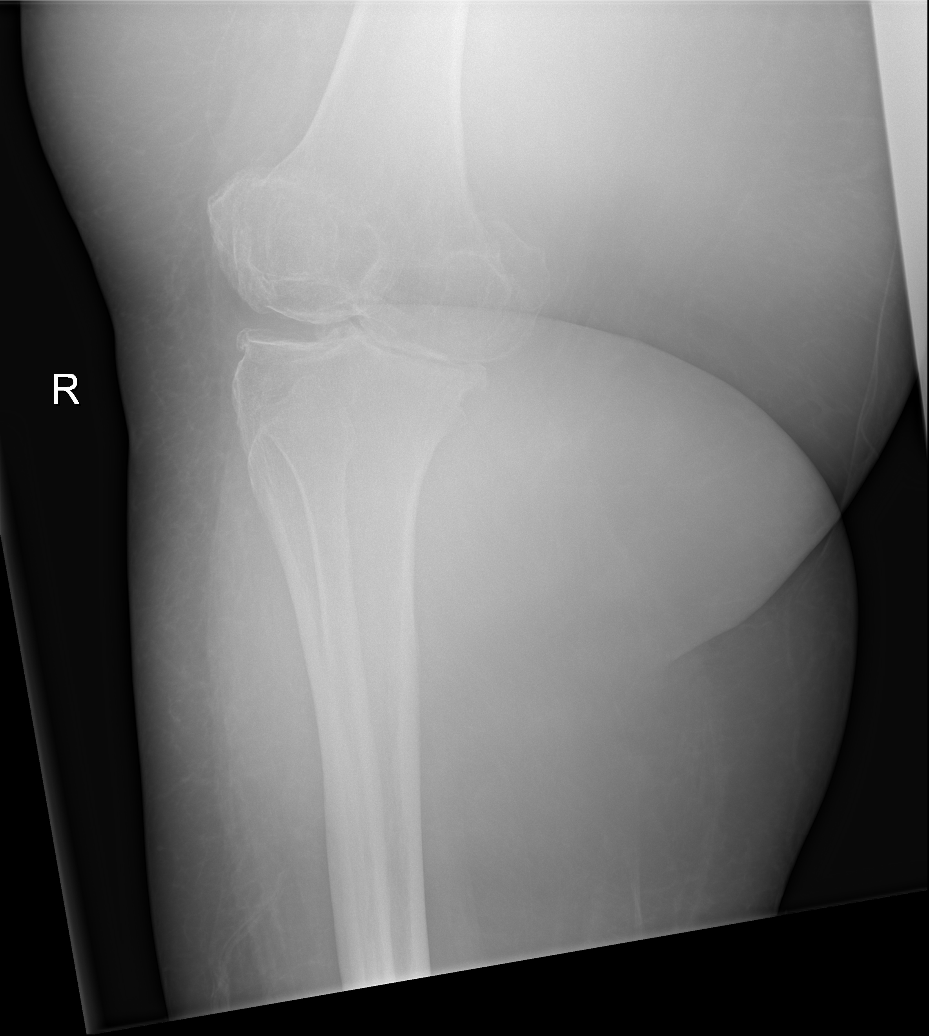

[x knee lat right]
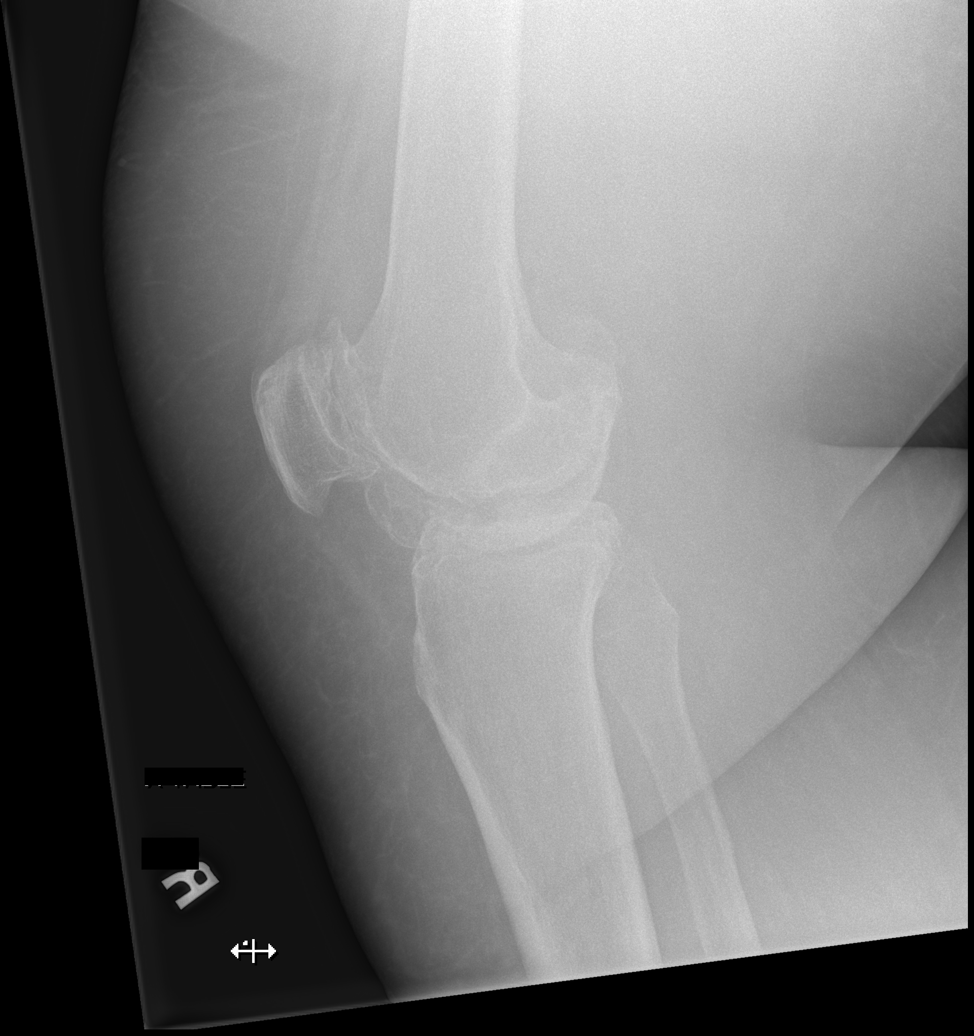

[5 of 5 positions shown; findings below may reference images not displayed]

FINDINGS: No fracture or malalignment. Marked arthritis involving the medial
and patellofemoral compartments with narrow and degenerative
spurring. Mild degenerative changes of the lateral compartment with
bony spurring present. Trace suprapatellar effusion.
IMPRESSION: 1. No acute osseous abnormality
2. Tricompartment arthritis most marked involving the medial and
patellofemoral compartments. Trace effusion.

## 2019-09-02 IMAGING — CR DG CHEST 1V PORT
1 series · 1 of 1 positions shown · non-contrast
Comparison: Chest x-ray of 09/30/2017

CLINICAL DATA: Cough

EXAM:
PORTABLE CHEST 1 VIEW

[AP]
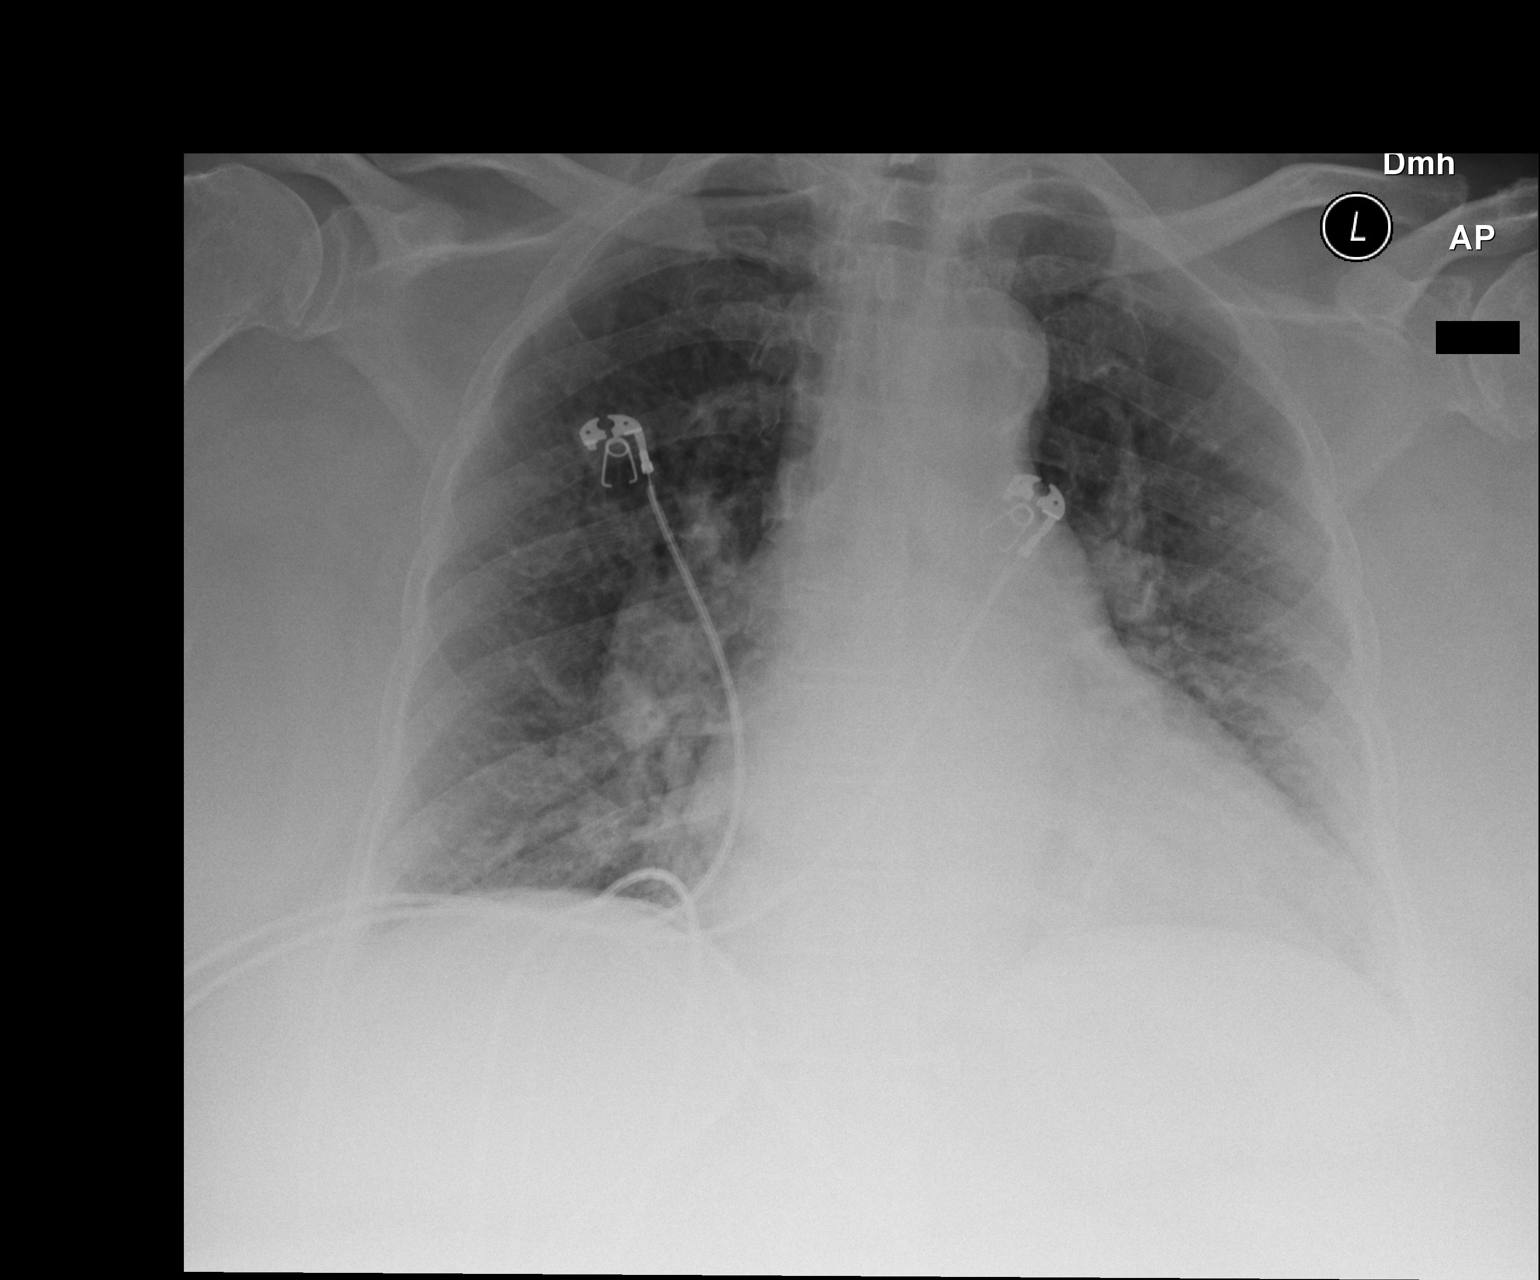

[1 of 1 positions shown; findings below may reference images not displayed]

FINDINGS: Somewhat prominent hila are stable most consistent with a degree of
pulmonary arterial hypertension. No pneumonia or effusion is seen.
Mild cardiomegaly is stable. No bony abnormality is seen.
IMPRESSION: Stable chest x-ray with mild cardiomegaly. No definite active
process.
